# Patient Record
Sex: Male | Born: 1950 | Race: White | Hispanic: No | Marital: Married | Smoking: Never smoker
Health system: Southern US, Community
[De-identification: ages and names within clinical notes are randomized; demographics above are authoritative.]

## PROBLEM LIST (undated history)

## (undated) DIAGNOSIS — G20 Parkinson's disease: Secondary | ICD-10-CM

## (undated) DIAGNOSIS — T7840XA Allergy, unspecified, initial encounter: Secondary | ICD-10-CM

## (undated) DIAGNOSIS — R296 Repeated falls: Secondary | ICD-10-CM

## (undated) DIAGNOSIS — J45909 Unspecified asthma, uncomplicated: Secondary | ICD-10-CM

## (undated) HISTORY — DX: Repeated falls: R29.6

## (undated) HISTORY — PX: SHOULDER SURGERY: SHX246

## (undated) HISTORY — PX: TONSILLECTOMY: SUR1361

## (undated) HISTORY — DX: Allergy, unspecified, initial encounter: T78.40XA

## (undated) HISTORY — PX: CATARACT EXTRACTION, BILATERAL: SHX1313

## (undated) HISTORY — PX: COLONOSCOPY: SHX174

## (undated) HISTORY — PX: VASECTOMY: SHX75

---

## 1999-06-30 DIAGNOSIS — G20 Parkinson's disease: Secondary | ICD-10-CM

## 1999-06-30 DIAGNOSIS — G20A1 Parkinson's disease without dyskinesia, without mention of fluctuations: Secondary | ICD-10-CM

## 1999-06-30 HISTORY — DX: Parkinson's disease without dyskinesia, without mention of fluctuations: G20.A1

## 1999-06-30 HISTORY — DX: Parkinson's disease: G20

## 2002-11-06 ENCOUNTER — Ambulatory Visit (HOSPITAL_COMMUNITY): Admission: RE | Admit: 2002-11-06 | Discharge: 2002-11-06 | Payer: Self-pay | Admitting: Gastroenterology

## 2011-03-29 DIAGNOSIS — N529 Male erectile dysfunction, unspecified: Secondary | ICD-10-CM | POA: Insufficient documentation

## 2012-09-15 ENCOUNTER — Emergency Department (HOSPITAL_COMMUNITY): Payer: BC Managed Care – PPO

## 2012-09-15 ENCOUNTER — Encounter (HOSPITAL_COMMUNITY): Payer: Self-pay | Admitting: Emergency Medicine

## 2012-09-15 ENCOUNTER — Emergency Department (HOSPITAL_COMMUNITY)
Admission: EM | Admit: 2012-09-15 | Discharge: 2012-09-15 | Disposition: A | Discharge status: Home / Self Care | Payer: BC Managed Care – PPO | Attending: Emergency Medicine | Admitting: Emergency Medicine

## 2012-09-15 DIAGNOSIS — Z79899 Other long term (current) drug therapy: Secondary | ICD-10-CM | POA: Insufficient documentation

## 2012-09-15 DIAGNOSIS — G20A1 Parkinson's disease without dyskinesia, without mention of fluctuations: Secondary | ICD-10-CM | POA: Insufficient documentation

## 2012-09-15 DIAGNOSIS — G20 Parkinson's disease: Secondary | ICD-10-CM | POA: Insufficient documentation

## 2012-09-15 DIAGNOSIS — J45909 Unspecified asthma, uncomplicated: Secondary | ICD-10-CM | POA: Insufficient documentation

## 2012-09-15 DIAGNOSIS — R0789 Other chest pain: Secondary | ICD-10-CM | POA: Insufficient documentation

## 2012-09-15 DIAGNOSIS — R079 Chest pain, unspecified: Secondary | ICD-10-CM

## 2012-09-15 HISTORY — DX: Parkinson's disease: G20

## 2012-09-15 HISTORY — DX: Unspecified asthma, uncomplicated: J45.909

## 2012-09-15 LAB — BASIC METABOLIC PANEL
BUN: 15 mg/dL (ref 6–23)
CO2: 26 mEq/L (ref 19–32)
Chloride: 102 mEq/L (ref 96–112)
Creatinine, Ser: 0.71 mg/dL (ref 0.50–1.35)
Potassium: 4 mEq/L (ref 3.5–5.1)

## 2012-09-15 LAB — CBC
HCT: 42.1 % (ref 39.0–52.0)
MCV: 94.6 fL (ref 78.0–100.0)
Platelets: 217 10*3/uL (ref 150–400)
RBC: 4.45 MIL/uL (ref 4.22–5.81)
WBC: 5 10*3/uL (ref 4.0–10.5)

## 2012-09-15 LAB — POCT I-STAT TROPONIN I: Troponin i, poc: 0.01 ng/mL (ref 0.00–0.08)

## 2012-09-15 MED ORDER — ASPIRIN 325 MG PO TABS
325.0000 mg | ORAL_TABLET | ORAL | Status: AC
  Administered 2012-09-15: 325 mg via ORAL
  Filled 2012-09-15: qty 1

## 2012-09-15 NOTE — ED Notes (Signed)
Pt presenting to ed with c/o chest tightness onset x couple of days. Pt denies nausea, vomiting and shortness of breath at this time. Pt denies radiating pain at this time

## 2012-09-15 NOTE — Discharge Instructions (Signed)
Chest Pain (Nonspecific) It is often hard to give a specific diagnosis for the cause of chest pain. There is always a chance that your pain could be related to something serious, such as a heart attack or a blood clot in the lungs. You need to follow up with your caregiver for further evaluation. CAUSES   Heartburn.  Pneumonia or bronchitis.  Anxiety or stress.  Inflammation around your heart (pericarditis) or lung (pleuritis or pleurisy).  A blood clot in the lung.  A collapsed lung (pneumothorax). It can develop suddenly on its own (spontaneous pneumothorax) or from injury (trauma) to the chest.  Shingles infection (herpes zoster virus). The chest wall is composed of bones, muscles, and cartilage. Any of these can be the source of the pain.  The bones can be bruised by injury.  The muscles or cartilage can be strained by coughing or overwork.  The cartilage can be affected by inflammation and become sore (costochondritis). DIAGNOSIS  Lab tests or other studies, such as X-rays, electrocardiography, stress testing, or cardiac imaging, may be needed to find the cause of your pain.  TREATMENT   Treatment depends on what may be causing your chest pain. Treatment may include:  Acid blockers for heartburn.  Anti-inflammatory medicine.  Pain medicine for inflammatory conditions.  Antibiotics if an infection is present.  You may be advised to change lifestyle habits. This includes stopping smoking and avoiding alcohol, caffeine, and chocolate.  You may be advised to keep your head raised (elevated) when sleeping. This reduces the chance of acid going backward from your stomach into your esophagus.  Most of the time, nonspecific chest pain will improve within 2 to 3 days with rest and mild pain medicine. HOME CARE INSTRUCTIONS   If antibiotics were prescribed, take your antibiotics as directed. Finish them even if you start to feel better.  For the next few days, avoid physical  activities that bring on chest pain. Continue physical activities as directed.  Do not smoke.  Avoid drinking alcohol.  Only take over-the-counter or prescription medicine for pain, discomfort, or fever as directed by your caregiver.  Follow your caregiver's suggestions for further testing if your chest pain does not go away.  Keep any follow-up appointments you made. If you do not go to an appointment, you could develop lasting (chronic) problems with pain. If there is any problem keeping an appointment, you must call to reschedule. SEEK MEDICAL CARE IF:   You think you are having problems from the medicine you are taking. Read your medicine instructions carefully.  Your chest pain does not go away, even after treatment.  You develop a rash with blisters on your chest. SEEK IMMEDIATE MEDICAL CARE IF:   You have increased chest pain or pain that spreads to your arm, neck, jaw, back, or abdomen.  You develop shortness of breath, an increasing cough, or you are coughing up blood.  You have severe back or abdominal pain, feel nauseous, or vomit.  You develop severe weakness, fainting, or chills.  You have a fever. THIS IS AN EMERGENCY. Do not wait to see if the pain will go away. Get medical help at once. Call your local emergency services (911 in [place] Do not drive yourself to the hospital. MAKE SURE YOU:   Understand these instructions.  Will watch your condition.  Will get help right away if you are not doing well or get worse. Document Released: 07/25/2005 Document Revised: 01/07/2012 Document Reviewed: 05/20/2008 ExitCare Patient Information 2013 [employer].  

## 2012-09-15 NOTE — ED Provider Notes (Signed)
History/physical exam/procedure(s) were performed by non-physician practitioner and as supervising physician I was immediately available for consultation/collaboration. I have reviewed all notes and am in agreement with care and plan.   Sherman Donaldson S Klay Sobotka, MD 09/15/12 1613 

## 2012-09-15 NOTE — ED Provider Notes (Signed)
History     CSN: [id number]  Arrival date & time 09/15/12  0709   First MD Initiated Contact with Patient 09/15/12 [phone]      Chief Complaint  Patient presents with  . Chest Pain    (Consider location/radiation/quality/duration/timing/severity/associated sxs/prior treatment) HPI Comments: 61 year old male with a history of asthma and Parkinson's presents emergency department with a chief complaint of chest tightness.  Onset of symptoms began Friday, located just left of the sternum, described as a constant pressure with intermittent seconds of pain worse with movement, non-positional and questionably exertional.  Patient denies any nausea, diaphoresis, shortness of breath, DOE, cough, fever, night sweats, chills, claudication, hemoptysis, abdominal pain. 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  Non-smoker, denies HTN, DM, HLD, family hx of cardiac issues.   PCP Dr. Clelia Croft, [place]   Patient is a 61 y.o. male presenting with chest pain. The history is provided by the patient.  Chest Pain     Past Medical History  Diagnosis Date  . Parkinson's disease   . Asthma     Past Surgical History  Procedure Date  . Vastectomy   . Tonsillectomy   . Shoulder surgery     No family history on file.  History  Substance Use Topics  . Smoking status: Never Smoker   . Smokeless tobacco: Not on file  . Alcohol Use: Yes     Comment: weekly      Review of Systems  Cardiovascular: Positive for chest pain.    Allergies  Review of patient's allergies indicates no known allergies.  Home Medications   Current Outpatient Rx  Name  Route  Sig  Dispense  Refill  . AMANTADINE HCL 100 MG PO CAPS   Oral   Take 100 mg by mouth daily.         Marland Kitchen CARBIDOPA-LEVODOPA-ENTACAPONE 25-100-200 MG PO TABS   Oral   Take 1 tablet by mouth 4 (four) times daily. 7am, 10am,  2pm,  and 6pm         . VITAMIN D3 1000 UNITS PO CAPS   Oral   Take 1 capsule by  mouth daily.         . IBUPROFEN 200 MG PO TABS   Oral   Take 200 mg by mouth every 6 (six) hours as needed. pain         . PRAMIPEXOLE DIHYDROCHLORIDE 1.5 MG PO TABS   Oral   Take 0.75 mg by mouth 4 (four) times daily. 1/2 tablet  at 7am, 10am,  2pm and and 6pm         . RASAGILINE MESYLATE 0.5 MG PO TABS   Oral   Take 1 mg by mouth every morning.           BP 129/75  Pulse 70  Temp 97.6 F (36.4 C) (Oral)  Resp 20  SpO2 97%  Physical Exam  Nursing note and vitals reviewed. Constitutional: He appears well-developed and well-nourished. No distress.  HENT:  Head: Normocephalic and atraumatic.  Eyes: Conjunctivae normal and EOM are normal. Pupils are equal, round, and reactive to light.  Neck: Normal range of motion. Neck supple. Normal carotid pulses and no JVD present. Carotid bruit is not present. No rigidity. Normal range of motion present.  Cardiovascular: Normal rate, regular rhythm, S1 normal, S2 normal, normal heart sounds, intact distal pulses and normal pulses.  Exam reveals no gallop and no friction rub.   No murmur  heard.      No pitting edema bilaterally, RRR, no aberrant sounds on auscultations, distal pulses intact, no carotid bruit or JVD.   Pulmonary/Chest: Effort normal and breath sounds normal. No accessory muscle usage or stridor. No respiratory distress. He exhibits no tenderness and no bony tenderness.       No reproducible chest wall tenderness  Abdominal: Bowel sounds are normal.       Soft non tender. Non pulsatile aorta.   Skin: Skin is warm, dry and intact. No rash noted. He is not diaphoretic. No cyanosis. Nails show no clubbing.    ED Course  Procedures (including critical care time)   Labs Reviewed  CBC  POCT I-STAT TROPONIN I  BASIC METABOLIC PANEL   No results found.   Date: 09/15/2012  Rate: 70  Rhythm: normal sinus rhythm  QRS Axis: normal  Intervals: normal  ST/T Wave abnormalities: normal  Conduction Disutrbances:  none  Narrative Interpretation:   Old EKG Reviewed: No old ECG    No diagnosis found.  Consult: Cardiology, to see as OP in office this week   MDM  Chest pain  61yo M w hx of parkinson's & asthma, presents to ER c/o CP onset Friday evening. Pain is not reproducible, positional & ?exertional. No associated symptoms, cardiac risk factors or family history of cardiac disease. D/t age and chief complaint will initiate cardiac work up & plan to consult cardiology for disposition of OP stress vs IP wk up. Anticipate dc home. Case discussed with attending Dr. Rosalia Hammers who agrees w my plan.   Pt to follow up w Dr. Jacinto Halim. Apt scheduled while in ER.        Jaci Carrel, [place] 09/15/12 1056

## 2012-09-22 DIAGNOSIS — R079 Chest pain, unspecified: Secondary | ICD-10-CM

## 2012-09-22 HISTORY — DX: Chest pain, unspecified: R07.9

## 2012-11-10 ENCOUNTER — Ambulatory Visit: Payer: BC Managed Care – PPO | Admitting: Internal Medicine

## 2013-05-04 ENCOUNTER — Encounter: Payer: Self-pay | Admitting: Neurology

## 2013-05-04 ENCOUNTER — Ambulatory Visit (INDEPENDENT_AMBULATORY_CARE_PROVIDER_SITE_OTHER): Payer: BC Managed Care – PPO | Admitting: Neurology

## 2013-05-04 VITALS — BP 100/64 | HR 67 | Temp 97.9°F | Ht 69.5 in | Wt 170.0 lb

## 2013-05-04 DIAGNOSIS — G20 Parkinson's disease: Secondary | ICD-10-CM

## 2013-05-04 DIAGNOSIS — G20A1 Parkinson's disease without dyskinesia, without mention of fluctuations: Secondary | ICD-10-CM

## 2013-05-04 NOTE — Patient Instructions (Addendum)
I think overall you are doing fairly well but I do want to suggest a few things today:  Remember to drink plenty of fluid, eat healthy meals and do not skip any meals. Try to eat protein with a every meal and eat a healthy snack such as fruit or nuts in between meals. Try to keep a regular sleep-wake schedule and try to exercise daily, particularly in the form of walking, 20-30 minutes a day, if you can.   Engage in social activities in your community and with your family and try to keep up with current events by reading the newspaper or watching the news.   As far as your medications are concerned, I would like to suggest no changes to your meds; try to be more regular with your meds.   As far as diagnostic testing: no new test needed at this time.   I would like to see you back in 6 months, sooner if we need to. Please call us with any interim questions, concerns, problems, updates or refill requests.  Brett Canales is my clinical assistant and will answer any of your questions and relay your messages to me and also relay most of my messages to you.  Our phone number is [phone]. We also have an after hours call service for urgent matters and there is a physician on-call for urgent questions. For any emergencies you know to call 911 or go to the nearest emergency room.

## 2013-05-04 NOTE — Progress Notes (Signed)
Subjective:    Patient ID: Zachary [name] is a 62 y.o. male.  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 He is unaccompanied today. This is his first visit after Dr. Imagene [name] retirement and he was last seen by Dr. love on 11/11/2012, at which time Dr. Sandria [name] felt that he was doing very well. He did not make any medication changes. The patient did report some daytime somnolence and was advised to take a midday nap. The patient has an underlying medical history of right fourth nerve palsy, Parkinson's disease and his current medications are amantadine 100 mg once daily, rasagiline 1 mg once daily, Stalevo 100 mg strength one tablet 4 times a day namely at 6 to 6:30, 10 to 11, 3-5 PM, between 6 to 7 PM. He is on Mirapex 1.5 mg strength half a tablet 4 times a day at the same times.   I reviewed Dr. Imagene [name] prior notes and the patient's records and below is a summary of the review of:  62 year old right-handed gentleman who was diagnosed with left Parkinson's disease in September 2000 and initially treated with selegiline and Mirapex. Sinemet was added in June 2005 and rasagiline was added and 2008. He was then switched to Stalevo. 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 He has symptoms suggestive of RBD. He had a sleep study in February 2011 which showed no sleep apnea, frequent PLMS and evidence of RBD. He denies hallucinations, agitation or delusions, postural dizziness, depression, swelling in his legs and impulsive behaviors, recurrent falls or memory loss. He exercises regularly.  He has occasional drooling at night. 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 He does work out with [employer] at [employer]T at [facility].   His Past Medical History Is Significant For: Past Medical History  Diagnosis Date  .  Parkinson's disease     Congenital R fourth nerve palsy  . Asthma    His Past Surgical History Is Significant For: Past Surgical History  Procedure Laterality Date  . Vastectomy    . Tonsillectomy    . Shoulder surgery     His Family History Is Significant For: Family History  Problem Relation Age of Onset  . Stroke Maternal Grandmother    His Social History Is Significant For: History   Social History  . Marital Status: Married    Spouse Name: Zachary [name]    Number of Children: 3  . 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 Smoking status: Never Smoker   . Smokeless tobacco: Never Used  . Alcohol Use: Yes     Comment: weekly  . Drug Use: No  . Sexually Active: None   Other Topics Concern  . None   Social History Narrative   Pt lives at home with family.   Caffeine Use: 1-2 cups daily.   His Allergies Are:  Allergies  Allergen Reactions  . Tree Extract     His Current Medications Are:  Outpatient Encounter Prescriptions as of 05/04/2013  Medication Sig Dispense Refill  . amantadine (SYMMETREL) 100 MG capsule Take 100 mg by mouth daily.      . carbidopa-levodopa-entacapone (STALEVO) 25-100-200 MG per tablet Take 1 tablet by mouth 4 (four) times daily. 7am, 10am,  2pm,  and 6pm      .  Cholecalciferol (VITAMIN D3) 1000 UNITS CAPS Take 1 capsule by mouth daily.      Marland Kitchen ibuprofen (ADVIL,MOTRIN) 200 MG tablet Take 200 mg by mouth every 6 (six) hours as needed. pain      . pramipexole (MIRAPEX) 1.5 MG tablet Take 0.75 mg by mouth 4 (four) times daily. 1/2 tablet  at 7am, 10am,  2pm and and 6pm      . rasagiline (AZILECT) 1 MG TABS Take 1 mg by mouth daily.      . [DISCONTINUED] rasagiline (AZILECT) 0.5 MG TABS Take 1 mg by mouth every morning.       No facility-administered encounter medications on file as of 05/04/2013.  : Review of Systems  Neurological:       Confusion, sleepiness    Objective:  Neurologic  Exam  Physical Exam Physical Examination:   Filed Vitals:   05/04/13 1133  BP: 100/64  Pulse: 67  Temp: 97.9 F (36.6 C)   General Examination: The patient is a very pleasant 62 y.o. male in no acute distress.  HEENT: Normocephalic, atraumatic, pupils are equal, round and reactive to light and accommodation. Funduscopic exam is normal with sharp disc margins noted. Extraocular tracking shows mild saccadic breakdown without nystagmus noted. There is no limitation to excursion of his gaze. There is mild decrease in eye blink rate. Hearing is intact. Tympanic membranes are clear bilaterally. Face is symmetric with mild facial masking and normal facial sensation. There is no lip, neck or jaw tremor. Neck is moderately rigid with intact passive ROM. There are no carotid bruits on auscultation. Oropharynx exam reveals mild mouth dryness. No significant airway crowding is noted. Mallampati is class II. Tongue protrudes centrally and palate elevates symmetrically. He has a mild head tilt to the R. There is no drooling.   Chest: is clear to auscultation without wheezing, rhonchi or crackles noted.  Heart: sounds are regular and normal without murmurs, rubs or gallops noted.   Abdomen: is soft, non-tender and non-distended with normal bowel sounds appreciated on auscultation.  Extremities: There is no pitting edema in the distal lower extremities bilaterally. Pedal pulses are intact. There are no varicose veins.  Skin: is warm and dry with no trophic changes noted.    Musculoskeletal: exam reveals no obvious joint deformities, tenderness, joint swelling or erythema.  Neurologically:  Mental status: The patient is awake and alert, paying good  attention. He is able to completely provide the history. He is oriented to: person, place, time/date, situation, day of week, month of year and year. His memory, attention, language and knowledge are intact. There is no aphasia, agnosia, apraxia or anomia.  There is no significant degree of bradyphrenia. Speech is mildly hypophonic with no dysarthria noted. Mood is congruent and affect is normal.    Cranial nerves are as described above under HEENT exam. In addition, shoulder shrug is normal with equal shoulder height noted.  Motor exam: Normal bulk, and strength for age is noted. There are no dyskinesias noted.   Tone is mildly rigid with presence of cogwheeling in the left upper extremity. There is overall mild bradykinesia. There is no drift or rebound.  There is no tremor with the exception of a rare resting tremor in the left upper extremity only.  Reflexes are 1+ in the upper extremities and 1+ in the lower extremities.   Fine motor skills exam: Finger taps are mildly impaired on the right and moderately impaired on the left. Hand movements are minimally impaired on  the right and mildly impaired on the left. RAP (rapid alternating patting) is not impaired on the right and mildly impaired on the left. Foot taps are mildly impaired on the right and moderately impaired on the left. Foot agility (in the form of heel stomping) is not impaired on the right and minimally impaired on the left.    Cerebellar testing shows no dysmetria or intention tremor on finger to nose testing. Heel to shin is unremarkable bilaterally. There is no truncal or gait ataxia.   Sensory exam is intact to light touch, pinprick, vibration, temperature sense and proprioception in the upper and lower extremities.   Gait, station and balance: He stands up from the seated position with no significant difficulty and does not need to push up with His hands. He needs no assistance. No veering to one side is noted. He is noted to lean to the right very slightly. Posture is mildly stooped. Stance is narrow-based. He walks with decrease in stride length and pace and decreased arm swing on the left. He turns in en bloc. Tandem walk is not possible. Balance is Preserved.   Assessment and  Plan:   Assessment and Plan:  In summary, Zachary [name] is a very pleasant 62 y.o.-year old male with a history of Parkinson's disease, left-sided predominant, likely in its 15th year at this time. Symptoms date back to when he was 48. His exam has remained stable and has not progressed in the past 4 months. I reassured him that regard. I did not suggest any medication changes and encouraged him to continue with regular exercise. I did ask him to take his medications more regularly, on 4 hourly schedule. He was in agreement. Since he is stable I suggested a six-month followup. He did not need any prescriptions today but next time he is up for refills I would like to change him to Mirapex 0.75 mg strength 4 times a day so he does not have to break the 1.5 mg strength pills. They do not break evenly.   I answered all his questions today and the patient was in agreement with the above outlined plan. I would like to see the patient back in 6 months, sooner if the need arises and encouraged him to call with any interim questions, concerns, problems or updates and refill requests.

## 2013-05-28 ENCOUNTER — Other Ambulatory Visit: Payer: Self-pay

## 2013-05-28 MED ORDER — RASAGILINE MESYLATE 1 MG PO TABS: 1.0000 mg | ORAL_TABLET | Freq: Every day | ORAL | Status: DC

## 2013-05-28 MED ORDER — PRAMIPEXOLE DIHYDROCHLORIDE 1.5 MG PO TABS: 0.7500 mg | ORAL_TABLET | Freq: Four times a day (QID) | ORAL | Status: DC

## 2013-05-28 MED ORDER — CARBIDOPA-LEVODOPA-ENTACAPONE 25-100-200 MG PO TABS: 1.0000 | ORAL_TABLET | Freq: Four times a day (QID) | ORAL | Status: DC

## 2013-06-26 ENCOUNTER — Other Ambulatory Visit: Payer: Self-pay | Admitting: Neurology

## 2013-06-26 ENCOUNTER — Other Ambulatory Visit: Payer: Self-pay

## 2013-06-26 MED ORDER — AMANTADINE HCL 100 MG PO CAPS: 100.0000 mg | ORAL_CAPSULE | Freq: Every day | ORAL | Status: DC

## 2013-09-03 ENCOUNTER — Other Ambulatory Visit: Payer: Self-pay

## 2013-11-03 ENCOUNTER — Encounter: Payer: Self-pay | Admitting: Neurology

## 2013-11-03 ENCOUNTER — Ambulatory Visit (INDEPENDENT_AMBULATORY_CARE_PROVIDER_SITE_OTHER): Payer: BC Managed Care – PPO | Admitting: Neurology

## 2013-11-03 ENCOUNTER — Encounter (INDEPENDENT_AMBULATORY_CARE_PROVIDER_SITE_OTHER): Payer: Self-pay

## 2013-11-03 VITALS — BP 119/74 | HR 68 | Temp 97.0°F | Ht 69.5 in | Wt 176.0 lb

## 2013-11-03 DIAGNOSIS — G20 Parkinson's disease: Secondary | ICD-10-CM

## 2013-11-03 MED ORDER — RASAGILINE MESYLATE 1 MG PO TABS: 1.0000 mg | ORAL_TABLET | Freq: Every day | ORAL | Status: DC

## 2013-11-03 MED ORDER — CARBIDOPA-LEVODOPA-ENTACAPONE 25-100-200 MG PO TABS: 1.0000 | ORAL_TABLET | Freq: Four times a day (QID) | ORAL | Status: DC

## 2013-11-03 MED ORDER — PRAMIPEXOLE DIHYDROCHLORIDE 1.5 MG PO TABS: 0.7500 mg | ORAL_TABLET | Freq: Four times a day (QID) | ORAL | Status: DC

## 2013-11-03 MED ORDER — AMANTADINE HCL 100 MG PO CAPS: 100.0000 mg | ORAL_CAPSULE | Freq: Every day | ORAL | Status: DC

## 2013-11-03 NOTE — Patient Instructions (Addendum)
I think your Parkinson's disease has remained fairly stable, which is reassuring. Nevertheless, as you know, this disease does progress with time. It can affect your balance, your memory, your mood, your bowel and bladder function, your posture, balance and walking. Overall you are doing fairly well but I do want to suggest a few things today:  Remember to drink plenty of fluid, eat healthy meals and do not skip any meals. Try to eat protein with a every meal and eat a healthy snack such as fruit or nuts in between meals. Try to keep a regular sleep-wake schedule and try to exercise daily, particularly in the form of walking, 20-30 minutes a day, if you can.   Taking your medication on schedule is key.   Try to stay active physically and mentally. Engage in social activities in your community and with your family and try to keep up with current events by reading the newspaper or watching the news. Try to do word puzzles and you may like to do word puzzles and brain games on the computer such as on [email or web address].   As far as your medications are concerned, I would like to suggest that you take your current medication with the following additional changes: no changes today.    As far as diagnostic testing, I will order: no new test.    I would like to see you back in 6 months, sooner if we need to. Please call us with any interim questions, concerns, problems, updates or refill requests.  Our nursing staff will answer any of your questions and relay your messages to me and also relay most of my messages to you.  Our phone number is [phone]. We also have an after hours call service for urgent matters and there is a physician on-call for urgent questions, that cannot wait till the next work day. For any emergencies you know to call 911 or go to the nearest emergency room.

## 2013-11-03 NOTE — Progress Notes (Signed)
Subjective:    Patient ID: Zachary [name] is a 63 y.o. male.  HPI  Interim history:   Zachary [name] is a very pleasant 63 year-old right-handed gentleman who presents for followup consultation of his left-sided predominant Parkinson's disease, originally diagnosed in September 2000. He is unaccompanied today. I first met him on 05/04/2013, which time I felt he was stable. I did not suggest any new medication changes at the time and encouraged him to continue with regular exercise. I also asked him to be regular with his medication regimen, which includes amantadine 100 mg once daily, rasagiline 1 mg once daily, Stalevo 100 mg strength one tablet 4 times a day namely at 6 to 6:30, 10 to 11, 2 or 2:30 PM, between 6 to 7 PM, no later than 7:30 PM and Mirapex 1.5 mg strength half a tablet 4 times a day. He previously followed with Dr. Morene Antu and was last seen by Dr. Erling Cruz on 11/11/2012, at which time Dr. Erling Cruz felt that he was doing very well. He did not make any medication changes. The patient did report some daytime somnolence and was advised to take a midday nap. The patient has an underlying medical history of right fourth nerve palsy, and Parkinson's disease.   Today, he reports no issues with his sleep, no constipation, no falls, no memory issues, no mood issues and he stopped taking vitamin D. He has noted an increase in stiffness and little increase in tremor and mild wearing off. He has called out in his dreams recently 2 times, but never has fallen out of bed.   He was diagnosed with left sided Parkinson's disease in September 2000 and initially treated with selegiline and Mirapex. Sinemet was added in June 2005 and rasagiline was added and 2008. He was then switched to Stalevo. 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. He has symptoms suggestive of RBD. He had a sleep study in February 2011 which showed no sleep apnea, frequent PLMS and evidence of RBD. He denies  hallucinations, agitation or delusions, postural dizziness, depression, swelling in his legs and impulsive behaviors, recurrent falls or memory loss. He exercises regularly.  He has occasional drooling at night. 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 He does work out with [employer] at [employer]C.T at [employer].   His Past Medical History Is Significant For: Past Medical History  Diagnosis Date  . Parkinson's disease     Congenital R fourth nerve palsy  . Asthma     His Past Surgical History Is Significant For: Past Surgical History  Procedure Laterality Date  . Vastectomy    . Tonsillectomy    . Shoulder surgery      His Family History Is Significant For: Family History  Problem Relation Age of Onset  . Stroke Maternal Grandmother   . Dementia Mother     His Social History Is Significant For: History   Social History  . Marital Status: Married    Spouse Name: Zachary [name]    Number of Children: 3  . 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 Smoking status: Never Smoker   . Smokeless tobacco: Never Used  . Alcohol Use: Yes     Comment: weekly  . Drug Use: No  . Sexual Activity: None   Other Topics Concern  . None   Social History Narrative   Pt lives at home with  family.   Caffeine Use: 1-2 cups daily.    His Allergies Are:  Allergies  Allergen Reactions  . Tree Extract   :   His Current Medications Are:  Outpatient Encounter Prescriptions as of 11/03/2013  Medication Sig  . amantadine (SYMMETREL) 100 MG capsule Take 1 capsule (100 mg total) by mouth daily.  . carbidopa-levodopa-entacapone (STALEVO) 25-100-200 MG per tablet Take 1 tablet by mouth 4 (four) times daily. 7am, 10am,  2pm,  and 6pm  . ibuprofen (ADVIL,MOTRIN) 200 MG tablet Take 200 mg by mouth every 6 (six) hours as needed. pain  . pramipexole (MIRAPEX) 1.5 MG tablet Take 0.5 tablets (0.75 mg total) by mouth 4 (four) times daily. 1/2  tablet  at 7am, 10am,  2pm and and 6pm  . rasagiline (AZILECT) 1 MG TABS Take 1 tablet (1 mg total) by mouth daily.  . Cholecalciferol (VITAMIN D3) 1000 UNITS CAPS Take 1 capsule by mouth daily.  :  Review of Systems:  Out of a complete 14 point review of systems, all are reviewed and negative with the exception of these symptoms as listed below:   Review of Systems  Constitutional: Negative.   HENT: Negative.   Eyes: Negative.   Respiratory: Negative.   Cardiovascular: Negative.   Gastrointestinal: Negative.   Endocrine: Negative.   Genitourinary: Negative.   Musculoskeletal: Negative.   Skin: Negative.   Allergic/Immunologic: Negative.   Neurological: Negative.   Hematological: Negative.   Psychiatric/Behavioral: Negative.   All other systems reviewed and are negative.    Objective:  Neurologic Exam  Physical Exam Physical Examination:   Filed Vitals:   11/03/13 1148  BP: 119/74  Pulse: 68  Temp: 97 F (36.1 C)   General Examination: The patient is a very pleasant 63 y.o. male in no acute distress.  HEENT: Normocephalic, atraumatic, pupils are equal, round and reactive to light and accommodation. Funduscopic exam is normal with sharp disc margins noted. Extraocular tracking shows mild saccadic breakdown without nystagmus noted. There is no limitation to excursion of his gaze. There is mild decrease in eye blink rate. Hearing is intact. Face is symmetric with mild facial masking and normal facial sensation. There is no lip, neck or jaw tremor. Neck is moderately rigid with intact passive ROM. There are no carotid bruits on auscultation. Oropharynx exam reveals mild mouth dryness. No significant airway crowding is noted. Mallampati is class II. Tongue protrudes centrally and palate elevates symmetrically. He has a mild head tilt to the R. There is no drooling.   Chest: is clear to auscultation without wheezing, rhonchi or crackles noted.  Heart: sounds are regular and  normal without murmurs, rubs or gallops noted.   Abdomen: is soft, non-tender and non-distended with normal bowel sounds appreciated on auscultation.  Extremities: There is no pitting edema in the distal lower extremities bilaterally. Pedal pulses are intact. There are no varicose veins.  Skin: is warm and dry with no trophic changes noted.    Musculoskeletal: exam reveals no obvious joint deformities, tenderness, joint swelling or erythema.  Neurologically:  Mental status: The patient is awake and alert, paying good  attention. He is able to completely provide the history. He is oriented to: person, place, time/date, situation, day of week, month of year and year. His memory, attention, language and knowledge are intact. There is no aphasia, agnosia, apraxia or anomia. There is no significant degree of bradyphrenia. Speech is mildly hypophonic with no dysarthria noted. Mood is congruent and affect is normal.  Cranial nerves are as described above under HEENT exam. In addition, shoulder shrug is normal with equal shoulder height noted.  Motor exam: Normal bulk, and strength for age is noted. There are no dyskinesias noted.   Tone is mildly rigid with presence of cogwheeling in the left upper extremity. There is overall mild bradykinesia. There is no drift or rebound.  There is no tremor with the exception of an intermittent resting tremor in the left upper extremity only.  Reflexes are 1+ in the upper extremities and 1+ in the lower extremities.   Fine motor skills exam: Finger taps are mildly impaired on the right and moderately impaired on the left. Hand movements are minimally impaired on the right and mildly impaired on the left. RAP (rapid alternating patting) is not impaired on the right and mildly impaired on the left. Foot taps are mildly impaired on the right and moderately impaired on the left. Foot agility (in the form of heel stomping) is not impaired on the right and minimally  impaired on the left.    Cerebellar testing shows no dysmetria or intention tremor on finger to nose testing. Heel to shin is unremarkable bilaterally. There is no truncal or gait ataxia.   Sensory exam is intact to light touch, pinprick, vibration, temperature sense in the upper and lower extremities.   Gait, station and balance: He stands up from the seated position with no significant difficulty and does not need to push up with His hands. He needs no assistance. No veering to one side is noted. He is noted to lean to the right very slightly. Posture is mildly stooped. Stance is narrow-based. He walks with decrease in stride length and good pace and decreased arm swing on the left, with more pronounced hand tremor on the L. He turns in en bloc. Balance is Preserved.   Assessment and Plan:   In summary, Zachary [name] is a very pleasant 63 year old male with a history of Parkinson's disease, left-sided predominant, likely in its 15th year at this time. Symptoms date back to when he was 47. His exam has remained fairly stable, even though he reports a slight increase in tremor and increase in stiffness as well as mild wearing off phenomenon. We mutually agreed that it is probably not enough to warrant increase in his medication. He exercises regularly and I talked to him today about his full-time work and he has in fact considered retirement. Today we focused most of our conversation and discussion on DBS surgery and candidacy for that. I think overall he would be a great surgical candidate. He still has a lot of room for medication adjustments. I talked to him about the surgical procedure, its limitations, side effects and contraindications in detail today. He would consider this down the Road. I suggested a six-month followup.  I renewed all his prescriptions today and encouraged him to call with any interim questions, concerns, problems or updates and refill requests.  Most of my 25 minute visit today  was spent in counseling and coordination of care, reviewing test results and reviewing medication.

## 2014-01-23 ENCOUNTER — Other Ambulatory Visit: Payer: Self-pay | Admitting: Neurology

## 2014-01-25 NOTE — Telephone Encounter (Signed)
pramipexole (MIRAPEX) 1.5 MG tablet 60 tablet 6 11/03/2013     Sig - Route: Take 0.5 tablets (0.75 mg total) by mouth 4 (four) times daily. 1/2 tablet at 7am, 10am, 2pm and and 6pm - Oral    E-Prescribing Status: Receipt confirmed by pharmacy (11/03/2013 1:09 PM EST)             Associated Diagnoses    Parkinson's disease - Primary          Pharmacy    [facility] [place] - [address]

## 2014-05-04 ENCOUNTER — Encounter: Payer: Self-pay | Admitting: Neurology

## 2014-05-24 ENCOUNTER — Ambulatory Visit: Payer: BC Managed Care – PPO | Admitting: Neurology

## 2014-06-22 ENCOUNTER — Encounter: Payer: Self-pay | Admitting: Neurology

## 2014-06-22 ENCOUNTER — Ambulatory Visit (INDEPENDENT_AMBULATORY_CARE_PROVIDER_SITE_OTHER): Payer: BC Managed Care – PPO | Admitting: Neurology

## 2014-06-22 ENCOUNTER — Other Ambulatory Visit: Payer: Self-pay

## 2014-06-22 VITALS — BP 96/58 | HR 66 | Temp 97.1°F | Ht 70.0 in | Wt 172.0 lb

## 2014-06-22 DIAGNOSIS — G20 Parkinson's disease: Secondary | ICD-10-CM

## 2014-06-22 MED ORDER — AMANTADINE HCL 100 MG PO CAPS: 100.0000 mg | ORAL_CAPSULE | Freq: Every day | ORAL | Status: DC

## 2014-06-22 MED ORDER — PRAMIPEXOLE DIHYDROCHLORIDE 1.5 MG PO TABS: 0.7500 mg | ORAL_TABLET | Freq: Four times a day (QID) | ORAL | Status: DC

## 2014-06-22 MED ORDER — CARBIDOPA-LEVODOPA-ENTACAPONE 25-100-200 MG PO TABS: 1.0000 | ORAL_TABLET | Freq: Four times a day (QID) | ORAL | Status: DC

## 2014-06-22 NOTE — Patient Instructions (Signed)
Let's keep the medications the same. Drink more water. Stay active, see you in 6 months.

## 2014-06-22 NOTE — Progress Notes (Signed)
Subjective:    Patient ID: Zachary [name] is a 63 y.o. male.  HPI    Interim history:   Zachary [name] is a very pleasant 63 year-old right-handed gentleman with an underlying medical history of asthma and congenital right fourth nerve palsy, who presents for followup consultation of his left-sided predominant Parkinson's disease, originally diagnosed in September 2000. He is unaccompanied today. I last saw him on 11/03/2013, at which time he reported no issues with his sleep, no constipation, no falls, no memory issues, no mood issues and he stopped taking vitamin D. He had noted an increase in stiffness and little increase in tremor and mild wearing off. He had called out in his dreams recently 2 times, but never has fallen out of bed. I did not make any medication changes. We talked about the possibility of pursuing DBS in the future.  Today, he feels well, but has noted a gradual increase in stiffness, and freezing and slowness. 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 His father passed away a month ago at the age of 50. His mother is in independent living and has dementia. She has 24/7 supervision. He and his brother are not agreeing on her care. He goes to [facility] 6 days a week.   I first met him on 05/04/2013, which time I felt he was stable. I did not suggest any new medication changes at the time and encouraged him to continue with regular exercise. I also asked him to be regular with his medication regimen, which includes amantadine 100 mg once daily, rasagiline 1 mg once daily, Stalevo 100 mg strength one tablet 4 times a day namely at 6 to 6:30, 10 to 11, 2 or 2:30 PM, between 6 to 7 PM, no later than 7:30 PM and Mirapex 1.5 mg strength half a tablet 4 times a day. He previously followed with Dr. Morene Antu and was last seen by Dr. Erling Cruz on 11/11/2012, at which time Dr. Erling Cruz felt that he was doing very well. He did not make any medication changes. The patient did report some daytime somnolence  and was advised to take a midday nap. The patient has an underlying medical history of right fourth nerve palsy, and Parkinson's disease.  He was diagnosed with left sided Parkinson's disease in September 2000 and initially treated with selegiline and Mirapex. Sinemet was added in June 2005 and rasagiline was added and 2008. He was then switched to Stalevo. 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. He has symptoms suggestive of RBD. He had a sleep study in February 2011 which showed no sleep apnea, frequent PLMS and evidence of RBD. He denies hallucinations, agitation or delusions, postural dizziness, depression, swelling in his legs and impulsive behaviors, recurrent falls or memory loss. He exercises regularly.  He has occasional drooling at night. 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 He does work out with [employer] at [employer]C.T at [employer].   His Past Medical History Is Significant For: Past Medical History  Diagnosis Date  . Parkinson's disease     Congenital R fourth nerve palsy  . Asthma     His Past Surgical History Is Significant For: Past Surgical History  Procedure Laterality Date  . Vastectomy    . Tonsillectomy    . Shoulder surgery      His Family History Is Significant For: Family History  Problem Relation Age of Onset  . Stroke Maternal Grandmother   . Dementia  Mother     His Social History Is Significant For: History   Social History  . Marital Status: Married    Spouse Name: [name]    Number of Children: 3  . 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 Smoking status: Never Smoker   . Smokeless tobacco: Never Used  . Alcohol Use: Yes     Comment: weekly  . Drug Use: No  . Sexual Activity: None   Other Topics Concern  . None   Social History Narrative   Pt lives at home with family.   Caffeine Use: 1-2 cups daily.    His Allergies Are:  Allergies  Allergen  Reactions  . Tree Extract   :   His Current Medications Are:  Outpatient Encounter Prescriptions as of 06/22/2014  Medication Sig  . amantadine (SYMMETREL) 100 MG capsule Take 1 capsule (100 mg total) by mouth daily.  . carbidopa-levodopa-entacapone (STALEVO) 25-100-200 MG per tablet Take 1 tablet by mouth 4 (four) times daily. 7am, 10am,  2pm,  and 6pm  . pramipexole (MIRAPEX) 1.5 MG tablet TAKE 1/2 TABLET AT 7AM, 10AM, 2PM AND 6PM.  . rasagiline (AZILECT) 1 MG TABS tablet Take 1 tablet (1 mg total) by mouth daily.  . [DISCONTINUED] Cholecalciferol (VITAMIN D3) 1000 UNITS CAPS Take 1 capsule by mouth daily.  . [DISCONTINUED] ibuprofen (ADVIL,MOTRIN) 200 MG tablet Take 200 mg by mouth every 6 (six) hours as needed. pain  :  Review of Systems:  Out of a complete 14 point review of systems, all are reviewed and negative with the exception of these symptoms as listed below:   Review of Systems  All other systems reviewed and are negative.   Objective:  Neurologic Exam  Physical Exam Physical Examination:   Filed Vitals:   06/22/14 1533  BP: 96/58  Pulse: 66  Temp: 97.1 F (36.2 C)   General Examination: The patient is a very pleasant 63 y.o. male in no acute distress.  HEENT: Normocephalic, atraumatic, pupils are equal, round and reactive to light and accommodation. Funduscopic exam is normal with sharp disc margins noted. Extraocular tracking shows mild saccadic breakdown without nystagmus noted. There is no limitation to excursion of his gaze. There is mild decrease in eye blink rate. Hearing is intact. Face is symmetric with mild facial masking and normal facial sensation. There is no lip, neck or jaw tremor. Neck is moderately rigid with intact passive ROM. There are no carotid bruits on auscultation. Oropharynx exam reveals mild mouth dryness. No significant airway crowding is noted. Mallampati is class II. Tongue protrudes centrally and palate elevates symmetrically. He has a  mild head tilt to the R, unchanged. There is no drooling.   Chest: is clear to auscultation without wheezing, rhonchi or crackles noted.  Heart: sounds are regular and normal without murmurs, rubs or gallops noted.   Abdomen: is soft, non-tender and non-distended with normal bowel sounds appreciated on auscultation.  Extremities: There is no pitting edema in the distal lower extremities bilaterally. Pedal pulses are intact. There are no varicose veins.  Skin: is warm and dry with no trophic changes noted.    Musculoskeletal: exam reveals no obvious joint deformities, tenderness, joint swelling or erythema.  Neurologically:  Mental status: The patient is awake and alert, paying good  attention. He is able to completely provide the history. He is oriented to: person, place, time/date, situation, day of week, month of year  and year. His memory, attention, language and knowledge are intact. There is no aphasia, agnosia, apraxia or anomia. There is no significant degree of bradyphrenia. Speech is mildly hypophonic with no dysarthria noted. Mood is congruent and affect is normal.    Cranial nerves are as described above under HEENT exam. In addition, shoulder shrug is normal with equal shoulder height noted.  Motor exam: Normal bulk, and strength for age is noted. There are no dyskinesias noted.   Tone is mildly rigid with presence of cogwheeling in the left upper extremity. There is overall mild bradykinesia. There is no drift or rebound.  There is no tremor today.   Reflexes are 1+ in the upper extremities and 1+ in the lower extremities.   Fine motor skills exam: Finger taps are mildly impaired on the right and moderately impaired on the left. Hand movements are minimally impaired on the right and mildly impaired on the left. RAP (rapid alternating patting) is mildly impaired on the right and mildly impaired on the left. Foot taps are mild to moderately impaired on the right and moderately  impaired on the left. Foot agility (in the form of heel stomping) is minimally impaired on the right and mildly impaired on the left.    Cerebellar testing shows no dysmetria or intention tremor on finger to nose testing. Heel to shin is unremarkable bilaterally. There is no truncal or gait ataxia.   Sensory exam is intact to light touch, pinprick, vibration, temperature sense in the upper and lower extremities.   Gait, station and balance: He stands up from the seated position with no significant difficulty and does not need to push up with His hands. He needs no assistance. No veering to one side is noted. He is noted to lean to the right very slightly. Posture is mildly stooped. Stance is narrow-based. He walks with decrease in stride length and good pace and decreased arm swing on the left, with more pronounced hand tremor on the L. He turns in en bloc. Balance is Preserved.   Assessment and Plan:   In summary, CORBETT MOULDER is a very pleasant 63 year old male with a history of Parkinson's disease, left-sided predominant, likely in its 15th year at this time. Symptoms date back to when he was around 55. His exam has remained fairly stable, but I do see a mild change in his fine motor skills and he reports a slight increase in stiffness and slowness and sometimes freezing. Nevertheless, he would like to continue with his current medications and not add anything else at this time or change his medication regimen. Of note, he has changed the timing of his Stalevo and his half pill of Mirapex 4 times a day to 4:45 or 6 AM, 9 to 9:15 AM, 12:30 PM, and 5 to 6 PM. He takes Mirapex 1.5 mg strength half a pill at those 4 times. He continues to take Azilect 1 mg once daily in the morning as well as amantadine 100 mg once daily in the morning. 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 He is thinking about phasing out. He stays very active and works out every day. We have previously discussed potential DBS  surgery down the road. At this juncture, I would like for him to continue the current medication regimen. We can certainly think about adding a little bit more Mirapex next time or maybe a fifth pill of Stalevo at, depending on how he is doing at the time. I think he  is doing well enough to come back in 6 months. He knows to call for any interim problems. He needed a prescription for Mirapex and amantadine today which I reviewed.

## 2014-07-21 ENCOUNTER — Other Ambulatory Visit: Payer: Self-pay | Admitting: Neurology

## 2014-12-05 ENCOUNTER — Other Ambulatory Visit: Payer: Self-pay | Admitting: Neurology

## 2014-12-22 ENCOUNTER — Telehealth: Payer: Self-pay | Admitting: Neurology

## 2014-12-22 NOTE — Telephone Encounter (Signed)
Patient is calling. He has an appointment tomorrow and wants to discuss at that appointment about cutting back or rearranging patient's medication due to drowsiness. Patient takes Azilect 1mg  one daily, Amantadine 100mg  once daily, Carbidopa-Levodopa-Entacapone 25-10-200mg  four times daily and Pramipexole 1.5mg  4 times daily. Patient does not needs a call back but wants Dr. Frances FurbishAthar to be aware of discussing this at his appointment tomorrow. Thank you.

## 2014-12-23 ENCOUNTER — Encounter: Payer: Self-pay | Admitting: Neurology

## 2014-12-23 ENCOUNTER — Ambulatory Visit (INDEPENDENT_AMBULATORY_CARE_PROVIDER_SITE_OTHER): Payer: BC Managed Care – PPO | Admitting: Neurology

## 2014-12-23 VITALS — BP 111/59 | HR 64 | Temp 97.9°F | Ht 70.0 in | Wt 178.0 lb

## 2014-12-23 DIAGNOSIS — G4719 Other hypersomnia: Secondary | ICD-10-CM

## 2014-12-23 DIAGNOSIS — G20 Parkinson's disease: Secondary | ICD-10-CM

## 2014-12-23 MED ORDER — AMANTADINE HCL 100 MG PO CAPS: 100.0000 mg | ORAL_CAPSULE | Freq: Every day | ORAL | Status: DC

## 2014-12-23 MED ORDER — RASAGILINE MESYLATE 1 MG PO TABS: 1.0000 mg | ORAL_TABLET | Freq: Every day | ORAL | Status: DC

## 2014-12-23 NOTE — Patient Instructions (Addendum)
I think your Parkinson's disease has remained fairly stable, which is reassuring. Nevertheless, as you know, this disease does progress with time. It can affect your balance, your memory, your mood, your bowel and bladder function, your posture, balance and walking. Overall you are doing fairly well but I do want to suggest a few things today:  Remember to drink plenty of fluid, eat healthy meals and do not skip any meals. Try to eat protein with a every meal and eat a healthy snack such as fruit or nuts in between meals. Try to keep a regular sleep-wake schedule and try to exercise daily, particularly in the form of walking, 20-30 minutes a day, if you can.   Taking your medication on schedule is key.   Try to stay active physically and mentally. Engage in social activities in your community and with your family and try to keep up with current events by reading the newspaper or watching the news. Try to do word puzzles and you may like to do word puzzles and brain games on the computer such as on [email or web address].   As far as your medications are concerned, I would like to suggest that you take your current medication with the following additional changes: we will reduce your mirapex to 1/2 pill 3 times a day for a week to see if your sleepiness improves. If your sleepiness is no better, you can go back up to 4 times a day, and cut back on 1 pill of the Stalevo for about a week. Let me know via email or phone how it goes. You may notice a flare up in your motor symptoms.    I would like to see you back in 3-4 months, sooner if we need to. Please call us with any interim questions, concerns, problems, updates or refill requests.  Our phone number is [phone]. We also have an after hours call service for urgent matters and there is a physician on-call for urgent questions, that cannot wait till the next work day. For any emergencies you know to call 911 or go to the nearest emergency room.

## 2014-12-23 NOTE — Progress Notes (Signed)
Subjective:    Patient ID: Zachary [name] is a 64 y.o. male.  HPI     Interim history:   Zachary [name] is a very pleasant 64 year-old right-handed gentleman with an underlying medical history of asthma and congenital right fourth nerve palsy, who presents for followup consultation of his left-sided predominant Parkinson's disease, originally diagnosed in September 2000. He is accompanied by his wife today. I last saw him on 06/22/2014, at which time he reported overall doing well but noticing a gradual increase in stiffness, freezing and slowness. He was working full-time as an Psychologist, sport and exercise. His father recently passed away at the age of 18. His mother was in independent living and has a history of dementia. He was working out regularly every week. I felt he was doing well enough to continue with the current regimen of Azilect, Mirapex and Stalevo. He was considering retirement. We talked about DBS surgery as well.  Today, he reports that he still working full-time. He is an Psychologist, sport and exercise. He may go another year he feels. He likes to play golf. He has spring break coming up in the next few weeks. His main complaint is sleepiness. He has noted significant sleepiness especially with driving in the afternoon or night. His wife is not letting him drive longer distances at night and does most of the driving when it comes to longer distances. In the last week or so he decreased his Stalevo and his Mirapex to only 3 times a day for about 4 days and noticed an improvement in his sleepiness but his stiffness and motor symptoms and motor symptoms in general, worse so he increased it again. Generally speaking he still active. He does not drink enough water. He has some sleep talking and yelling out and sleep but no significant dream enactments and this actually is less than in the past according to his wife. He has no lower extremity swelling, no significant hallucinations but has had the occasional visual  illusion, no mood disorder, no significant memory issues, and no evidence of impulse control disorder.  I saw him on 11/03/2013, at which time he reported no issues with his sleep, no constipation, no falls, no memory issues, no mood issues and he stopped taking vitamin D. He had noted an increase in stiffness and little increase in tremor and mild wearing off. He had called out in his dreams recently 2 times, but never has fallen out of bed. I did not make any medication changes. We talked about the possibility of pursuing DBS in the future.  I first met him on 05/04/2013, which time I felt he was stable. I did not suggest any new medication changes at the time and encouraged him to continue with regular exercise. I also asked him to be regular with his medication regimen, which includes amantadine 100 mg once daily, rasagiline 1 mg once daily, Stalevo 100 mg strength one tablet 4 times a day namely at 6 to 6:30, 10 to 11, 2 or 2:30 PM, between 6 to 7 PM, no later than 7:30 PM and Mirapex 1.5 mg strength half a tablet 4 times a day. He previously followed with Dr. Morene Antu and was last seen by Dr. Erling Cruz on 11/11/2012, at which time Dr. Erling Cruz felt that he was doing very well. He did not make any medication changes. The patient did report some daytime somnolence and was advised to take a midday nap. The patient has an underlying medical history of right fourth nerve palsy,  and Parkinson's disease.   He was diagnosed with left sided Parkinson's disease in September 2000 and initially treated with selegiline and Mirapex. Sinemet was added in June 2005 and rasagiline was added and 2008. He was then switched to Stalevo. 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. He has symptoms suggestive of RBD. He had a sleep study in February 2011 which showed no sleep apnea, frequent PLMS and evidence of RBD. He denies hallucinations, agitation or delusions, postural dizziness, depression, swelling in his  legs and impulsive behaviors, recurrent falls or memory loss. He exercises regularly.   He has occasional drooling at night. He teaches full time at [employer] and plays golf regularly. He does work out with [employer] at [employer] at [facility].   His Past Medical History Is Significant For: Past Medical History  Diagnosis Date  . Parkinson's disease     Congenital R fourth nerve palsy  . Asthma     His Past Surgical History Is Significant For: Past Surgical History  Procedure Laterality Date  . Vastectomy    . Tonsillectomy    . Shoulder surgery      His Family History Is Significant For: Family History  Problem Relation Age of Onset  . Stroke Maternal Grandmother   . Dementia Mother     His Social History Is Significant For: History   Social History  . Marital Status: Married    Spouse Name: Zachary [name]  . Number of Children: 3  . 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 Smoking status: Never Smoker   . Smokeless tobacco: Never Used  . Alcohol Use: Yes     Comment: weekly  . Drug Use: No  . Sexual Activity: Not on file   Other Topics Concern  . None   Social History Narrative   Pt lives at home with family.   Caffeine Use: 1-2 cups daily.    His Allergies Are:  Allergies  Allergen Reactions  . Tree Extract   :   His Current Medications Are:  Outpatient Encounter Prescriptions as of 12/23/2014  Medication Sig  . amantadine (SYMMETREL) 100 MG capsule Take 1 capsule (100 mg total) by mouth daily.  . AZILECT 1 MG TABS tablet TAKE 1 TABLET ONCE DAILY.  . carbidopa-levodopa-entacapone (STALEVO) 25-100-200 MG per tablet Take 1 tablet by mouth 4 (four) times daily. 7am, 10am,  2pm,  and 6pm  . pramipexole (MIRAPEX) 1.5 MG tablet Take 0.5 tablets (0.75 mg total) by mouth 4 (four) times daily.  :  Review of Systems:  Out of a complete 14 point review of systems, all are reviewed and negative with the exception of these  symptoms as listed below:   Review of Systems  All other systems reviewed and are negative.   Objective:  Neurologic Exam  Physical Exam Physical Examination:   Filed Vitals:   12/23/14 1529  BP: 111/59  Pulse: 64  Temp: 97.9 F (36.6 C)   General Examination: The patient is a very pleasant 64 y.o. male in no acute distress.  HEENT: Normocephalic, atraumatic, pupils are equal, round and reactive to light and accommodation. Funduscopic exam is normal with sharp disc margins noted. Extraocular tracking shows mild saccadic breakdown without nystagmus noted. There is no limitation to excursion of his gaze. There is mild decrease in eye blink rate. Hearing is intact. Face is symmetric with mild facial masking and normal facial sensation. There  is no lip, neck or jaw tremor. Neck is moderately rigid with intact passive ROM. There are no carotid bruits on auscultation. Oropharynx exam reveals mild mouth dryness. No significant airway crowding is noted. Mallampati is class II. Tongue protrudes centrally and palate elevates symmetrically. He has a mild head tilt to the R, unchanged. There is no drooling.   Chest: is clear to auscultation without wheezing, rhonchi or crackles noted.  Heart: sounds are regular and normal without murmurs, rubs or gallops noted.   Abdomen: is soft, non-tender and non-distended with normal bowel sounds appreciated on auscultation.  Extremities: There is no pitting edema in the distal lower extremities bilaterally. Pedal pulses are intact. There are no varicose veins.  Skin: is warm and dry with no trophic changes noted.    Musculoskeletal: exam reveals no obvious joint deformities, tenderness, joint swelling or erythema.  Neurologically:  Mental status: The patient is awake and alert, paying good  attention. He is able to completely provide the history. He is oriented to: person, place, time/date, situation, day of week, month of year and year. His memory,  attention, language and knowledge are intact. There is no aphasia, agnosia, apraxia or anomia. There is no significant degree of bradyphrenia. Speech is mildly hypophonic with no dysarthria noted. Mood is congruent and affect is normal.    Cranial nerves are as described above under HEENT exam. In addition, shoulder shrug is normal with right shoulder slightly higher than left.  Motor exam: Normal bulk, and strength for age is noted. There are no dyskinesias noted.   Tone is mild to moderately rigid with presence of cogwheeling in the left upper extremity. There is overall mild bradykinesia. There is no drift or rebound.  There is an intermittent left upper extremity resting tremor.   Reflexes are 1+ in the upper extremities and 1+ in the lower extremities.   Fine motor skills exam: Finger taps are mild to moderately impaired on the right and moderately impaired on the left. Hand movements are minimally impaired on the right and mildly impaired on the left. RAP (rapid alternating patting) is mildly impaired on the right and mildly impaired on the left. Foot taps are mild to moderately impaired on the right and moderately impaired on the left. Foot agility (in the form of heel stomping) is mildly impaired on the right and mild to moderately impaired on the left.    Cerebellar testing shows no dysmetria or intention tremor on finger to nose testing. Heel to shin is unremarkable bilaterally. There is no truncal or gait ataxia.   Sensory exam is intact to light touch, pinprick, vibration, temperature sense in the upper and lower extremities.   Gait, station and balance: He stands up from the seated position with no significant difficulty and does not need to push up with His hands. He needs no assistance. No veering to one side is noted. He is noted to lean to the right very slightly. Posture is mildly stooped. Stance is narrow-based. He walks with decrease in stride length but  good pace and decreased arm  swing on the left, with more pronounced hand tremor on the L. He turns in en bloc. Balance is Preserved.   Assessment and Plan:   In summary, Zachary [name] is a very pleasant 64 year old male with a history of Parkinson's disease, left-sided predominant, likely in its 15th to 62 th year at this time. Symptoms date back to when he was around 36. His exam has remained fairly  stable, but I do see a mild change in his fine motor skills and he reports  bothersome sleepiness for which he reduced his medication for a few days. At this juncture, I would like to decrease his Mirapex to half a pill 3 times a day. He takes the 1.5 mg strength half a pill at a time. I think between the Stalevo and the Mirapex the dopamine agonist is more likely to give him significant sleepiness. He is advised of course not to drive when feeling sleepy. If this does not make a difference after about a week he can go back up to 4 times a day with a half pill of Mirapex and then decrease his Stalevo to 1 pill 3 times a day from currently 4 times a day. We will try this for a week as well. He is advised to call me or email with an update. We may decide to decrease the Mirapex longer-term and eventually increase his Stalevo dose to 150 mg strength. At this juncture, I will await his update in about a week or 2. We will keep the Azilect at 1 mg once daily as well as the amantadine at 100 mg once daily. Neither one of them is likely to cause him severe drowsiness.  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 He is thinking about phasing out in a year. He stays very active and works out every day and likes to play golf. We have previously discussed potential DBS surgery and he is reminded to drink more water.  I would like to see him back in 3 or 4 months since we are making some changes. I only renewed his prescription for amantadine and Azilect at this time because we may be making changes to the other 2 medicines.  I answered all their  questions and the patient and his wife were in agreement.

## 2015-02-21 ENCOUNTER — Other Ambulatory Visit: Payer: Self-pay | Admitting: Neurology

## 2015-04-19 ENCOUNTER — Ambulatory Visit: Payer: BC Managed Care – PPO | Admitting: Neurology

## 2015-04-26 ENCOUNTER — Encounter: Payer: Self-pay | Admitting: Neurology

## 2015-04-26 ENCOUNTER — Ambulatory Visit (INDEPENDENT_AMBULATORY_CARE_PROVIDER_SITE_OTHER): Payer: BC Managed Care – PPO | Admitting: Neurology

## 2015-04-26 VITALS — BP 122/74 | HR 72 | Resp 16 | Ht 70.0 in | Wt 177.0 lb

## 2015-04-26 DIAGNOSIS — G4752 REM sleep behavior disorder: Secondary | ICD-10-CM | POA: Diagnosis not present

## 2015-04-26 DIAGNOSIS — G20 Parkinson's disease: Secondary | ICD-10-CM

## 2015-04-26 MED ORDER — CLONAZEPAM 0.25 MG PO TBDP: 0.2500 mg | ORAL_TABLET | Freq: Every day | ORAL | Status: DC

## 2015-04-26 MED ORDER — CARBIDOPA-LEVODOPA-ENTACAPONE 25-100-200 MG PO TABS: 1.0000 | ORAL_TABLET | Freq: Every day | ORAL | Status: DC

## 2015-04-26 MED ORDER — PRAMIPEXOLE DIHYDROCHLORIDE 0.75 MG PO TABS: 0.7500 mg | ORAL_TABLET | Freq: Four times a day (QID) | ORAL | Status: DC

## 2015-04-26 NOTE — Patient Instructions (Addendum)
I think your Parkinson's disease has remained fairly stable, which is reassuring. Nevertheless, as you know, this disease does progress with time. It can affect your balance, your memory, your mood, your bowel and bladder function, your posture, balance and walking. Overall you are doing fairly well but I do want to suggest a few things today:  Remember to drink plenty of fluid, eat healthy meals and do not skip any meals. Try to eat protein with a every meal and eat a healthy snack such as fruit or nuts in between meals. Try to keep a regular sleep-wake schedule and try to exercise daily, particularly in the form of walking, 20-30 minutes a day, if you can.   Drink more water!  Taking your medication on schedule is key.    Try to stay active physically and mentally. Engage in social activities in your community and with your family and try to keep up with current events by reading the newspaper or watching the news. Try to do word puzzles and you may like to do word puzzles and brain games on the computer such as on [email or web address].   As far as your medications are concerned, I would like to suggest that you take your current medication with the following additional changes: we will increase your Stalevo 100 mg to 5 times a day, every 3 1/2 to 4 hours, last dose at bedtime. For your dream enactments, let's try low dose Klonopin (generic: clonazepam): 0.25 mg, take one pill each bedtime. Common side effects include sedation or sleepiness, balance problem, personality changes.     I would like to see you back in 4 months, sooner if we need to. Please call us with any interim questions, concerns, problems, updates or refill requests.  Our phone number is [phone]. We also have an after hours call service for urgent matters and there is a physician on-call for urgent questions, that cannot wait till the next work day. For any emergencies you know to call 911 or go to the nearest emergency room.

## 2015-04-26 NOTE — Progress Notes (Signed)
Subjective:    Patient ID: Zachary [name] is a 64 y.o. male.  HPI     Interim history:   [name] is a very pleasant 64 year-old right-handed gentleman with an underlying medical history of asthma and congenital right fourth nerve palsy, who presents for followup consultation of his left-sided predominant Parkinson's disease, originally diagnosed in September 2000. He is accompanied by his wife today. 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 He felt, he would go for another year. He was plaing golf. He complained of sleepiness. He was not driving at night or long distances. He had decreased his Stalevo and his Mirapex to only 3 times a day for about 4 days and noticed an improvement in his sleepiness but his stiffness and motor symptoms became worse so he increased it again. He was not drinking enough water. He had no lower extremity swelling, no significant hallucinations, no mood disorder, no significant memory issues, and no evidence of impulse control disorder. I suggested he continue with Azilect 1 mg once daily and amantadine 100 mg once daily but asked him to decrease his Mirapex to half a pill 3 times a day, because of his sleepiness.  Today, 04/26/15: He reports more tremors. He has noted more difficulty with balance especially at night when he has to get up to use the bathroom. He takes his Stalevo 3-1/2-4-1/2 hours apart, 100 mg strength. When he tried to reduce the Mirapex he felt worse so he went back up to half a pill 4 times a day. He continues to take Azilect once daily and amantadine 100 mg once daily. He continues to teach full-time. He would like to go on for another year and then retire. He drives well but tries to stick with shorter distances and daytime only. His wife provides additional information. She reports increase in nighttime dream enactments, at times almost nightly with flailing movements and sometimes loud  yelling reported. He is not aware of this. He still may not drink enough water. He exercises regularly. He denies any constipation. He has mild forgetfulness and his wife agrees that memory is for the most part stable. He has no mood related issues.  Previously:   I saw him on 06/22/2014, at which time he reported overall doing well but noticing a gradual increase in stiffness, freezing and slowness. He was working full-time as an Psychologist, sport and exercise. His father recently passed away at the age of 46. His mother was in independent living and has a history of dementia. He was working out regularly every week. I felt he was doing well enough to continue with the current regimen of Azilect, Mirapex and Stalevo. He was considering retirement. We talked about DBS surgery as well.   I saw him on 11/03/2013, at which time he reported no issues with his sleep, no constipation, no falls, no memory issues, no mood issues and he stopped taking vitamin D. He had noted an increase in stiffness and little increase in tremor and mild wearing off. He had called out in his dreams recently 2 times, but never has fallen out of bed. I did not make any medication changes. We talked about the possibility of pursuing DBS in the future.  I first met him on 05/04/2013, which time I felt he was stable. I did not suggest any new medication changes at the time and encouraged him to continue with regular exercise. I also asked him to  be regular with his medication regimen, which includes amantadine 100 mg once daily, rasagiline 1 mg once daily, Stalevo 100 mg strength one tablet 4 times a day namely at 6 to 6:30, 10 to 11, 2 or 2:30 PM, between 6 to 7 PM, no later than 7:30 PM and Mirapex 1.5 mg strength half a tablet 4 times a day. He previously followed with Dr. Morene Antu and was last seen by Dr. Erling Cruz on 11/11/2012, at which time Dr. Erling Cruz felt that he was doing very well. He did not make any medication changes. The patient did  report some daytime somnolence and was advised to take a midday nap. The patient has an underlying medical history of right fourth nerve palsy, and Parkinson's disease.   He was diagnosed with left sided Parkinson's disease in September 2000 and initially treated with selegiline and Mirapex. Sinemet was added in June 2005 and rasagiline was added and 2008. He was then switched to Stalevo. 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. He has symptoms suggestive of RBD. He had a sleep study in February 2011 which showed no sleep apnea, frequent PLMS and evidence of RBD. He denies hallucinations, agitation or delusions, postural dizziness, depression, swelling in his legs and impulsive behaviors, recurrent falls or memory loss. He exercises regularly.   He has occasional drooling at night. He teaches full time at [employer] and plays golf regularly. He does work out with [employer] at [employer]T at [facility].   His Past Medical History Is Significant For: Past Medical History  Diagnosis Date  . Parkinson's disease     Congenital R fourth nerve palsy  . Asthma     His Past Surgical History Is Significant For: Past Surgical History  Procedure Laterality Date  . Vastectomy    . Tonsillectomy    . Shoulder surgery      His Family History Is Significant For: Family History  Problem Relation Age of Onset  . Stroke Maternal Grandmother   . Dementia Mother     His Social History Is Significant For: History   Social History  . Marital Status: Married    Spouse Name: [name]  . Number of Children: 3  . 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 Smoking status: Never Smoker   . Smokeless tobacco: Never Used  . Alcohol Use: 0.0 oz/week    0 Standard drinks or equivalent per week     Comment: weekly  . Drug Use: No  . Sexual Activity: Not on file   Other Topics Concern  . None   Social History Narrative   Pt lives at home  with family.   Caffeine Use: 1-2 cups daily.    His Allergies Are:  Allergies  Allergen Reactions  . Tree Extract   :   His Current Medications Are:  Outpatient Encounter Prescriptions as of 04/26/2015  Medication Sig  . amantadine (SYMMETREL) 100 MG capsule Take 1 capsule (100 mg total) by mouth daily.  . pramipexole (MIRAPEX) 1.5 MG tablet Take 0.5 tablets (0.75 mg total) by mouth 4 (four) times daily.  . rasagiline (AZILECT) 1 MG TABS tablet Take 1 tablet (1 mg total) by mouth daily.  . STALEVO 100 25-100-200 MG per tablet TAKE 1 TAB 4 TIMES DAILY, AT 7AM, 10AM, 2PM, AND 6PM.   No facility-administered encounter medications on file as of 04/26/2015.  :  Review of Systems:  Out  of a complete 14 point review of systems, all are reviewed and negative with the exception of these symptoms as listed below:   Review of Systems  Endocrine:       Wife states that patient "puts off a lot of heat"  Neurological:       Shuffling more at night with low light, medications do not seem to last as long    Objective:  Neurologic Exam  Physical Exam Physical Examination:   Filed Vitals:   04/26/15 1553  BP: 122/74  Pulse: 72  Resp: 16   General Examination: The patient is a very pleasant 64 y.o. male in no acute distress.  HEENT: Normocephalic, atraumatic, pupils are equal, round and reactive to light and accommodation. Funduscopic exam is normal with sharp disc margins noted. Extraocular tracking shows mild saccadic breakdown without nystagmus noted. There is no limitation to excursion of his gaze. There is mild decrease in eye blink rate. Hearing is intact. Face is symmetric with mild facial masking and normal facial sensation. There is no lip, neck or jaw tremor. Neck is moderately rigid with intact passive ROM. There are no carotid bruits on auscultation. Oropharynx exam reveals mild mouth dryness. No significant airway crowding is noted. Mallampati is class II. Tongue protrudes  centrally and palate elevates symmetrically. He has a mild head tilt to the R, unchanged. There is no drooling.   Chest: is clear to auscultation without wheezing, rhonchi or crackles noted.  Heart: sounds are regular and normal without murmurs, rubs or gallops noted.   Abdomen: is soft, non-tender and non-distended with normal bowel sounds appreciated on auscultation.  Extremities: There is no pitting edema in the distal lower extremities bilaterally. Pedal pulses are intact. There are no varicose veins.  Skin: is warm and dry with no trophic changes noted.    Musculoskeletal: exam reveals no obvious joint deformities, tenderness, joint swelling or erythema.  Neurologically:  Mental status: The patient is awake and alert, paying good  attention. He is able to completely provide the history. He is oriented to: person, place, time/date, situation, day of week, month of year and year. His memory, attention, language and knowledge are well preserved. There is no aphasia, agnosia, apraxia or anomia. There is no significant degree of bradyphrenia. Speech is mild to moderately hypophonic with no dysarthria noted. Mood is congruent and affect is normal.    Cranial nerves are as described above under HEENT exam. In addition, shoulder shrug is normal with right shoulder slightly higher than left.  Motor exam: Normal bulk, and strength for age is noted. There are no dyskinesias noted.   Tone is mild to moderately rigid with presence of cogwheeling in the left upper extremity. There is overall mild bradykinesia. There is no drift or rebound.  There is a persistent left upper extremity resting tremor.   Reflexes are 1+ in the upper extremities and 1+ in the lower extremities.   Fine motor skills exam: Finger taps are mild to moderately impaired on the right and moderately impaired on the left. Hand movements are mildly impaired on the right and mild to moderately impaired on the left. RAP (rapid  alternating patting) is mildly impaired on the right and mildly impaired on the left. Foot taps are mild to moderately impaired on the right and moderately impaired on the left. Foot agility (in the form of heel stomping) is mildly impaired on the right and mild to moderately impaired on the left.    Cerebellar testing shows  no dysmetria or intention tremor on finger to nose testing. Heel to shin is unremarkable bilaterally. There is no truncal or gait ataxia.   Sensory exam is intact to light touch, pinprick, vibration, temperature sense in the upper and lower extremities.   Gait, station and balance: He stands up from the seated position with no significant difficulty and does not need to push up with His hands. He needs no assistance. No veering to one side is noted. He is noted to lean to the right. Posture is mild to near-moderately stooped. Stance is narrow-based. He walks with decrease in stride length but good pace and decreased arm swing on the left, with more pronounced hand tremor on the L. He turns in en bloc. Balance is slightly impaired.    Assessment and Plan:   In summary, LEEVON UPPERMAN is a very pleasant 64 year old male with a history of Parkinson's disease, left-sided predominant, likely in its 61 th year at this time. Symptoms date back to when he was around 4. His exam has progressed a little more. At this juncture, I suggested we have him take Stalevo 100 mg 5 times a day, namely at 6, 10, 2 PM, 6 PM and bedtime. He can continue with Mirapex 0.75 mg 4 times a day. I will change his prescription in that regard so he does not have to keep breaking pills. For his REM behavior disorder I would like for him to start clonazepam low dose, 0.25 mg at bedtime only. He is a little reluctant to try this and I suggested he can try this for about 2 weeks and see if he sleeps better and his wife can defend feedback in that regard. We will continue with amantadine and Azilect at once daily. I  provided him with a new prescription for Stalevo and clonazepam. I would like to see him back in 4 months, sooner if needed. I did encourage him to stay active physically and drink more water. 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 He is thinking about phasing out next year. He stays very active and works out every day and likes to play golf. We have previously discussed potential DBS surgery. I advised them to continue to monitor his mood, his memory, his sleep, and his driving. I answered all their questions and the patient and his wife were in agreement.  I spent 25 minutes in total face-to-face time with the patient, more than 50% of which was spent in counseling and coordination of care, reviewing test results, reviewing medication and discussing or reviewing the diagnosis of PD, its prognosis and treatment options.

## 2015-07-20 ENCOUNTER — Other Ambulatory Visit: Payer: Self-pay | Admitting: Neurology

## 2015-08-15 ENCOUNTER — Other Ambulatory Visit: Payer: Self-pay | Admitting: Neurology

## 2015-08-31 ENCOUNTER — Ambulatory Visit: Payer: BC Managed Care – PPO | Admitting: Neurology

## 2015-08-31 ENCOUNTER — Telehealth: Payer: Self-pay

## 2015-08-31 NOTE — Telephone Encounter (Signed)
I spoke to patient to reschedule appt today. Dr. Frances FurbishAthar is out sick. We were able to reschedule for later this month.

## 2015-09-17 ENCOUNTER — Other Ambulatory Visit: Payer: Self-pay | Admitting: Neurology

## 2015-09-19 ENCOUNTER — Encounter: Payer: Self-pay | Admitting: Neurology

## 2015-09-19 ENCOUNTER — Ambulatory Visit (INDEPENDENT_AMBULATORY_CARE_PROVIDER_SITE_OTHER): Payer: BC Managed Care – PPO | Admitting: Neurology

## 2015-09-19 VITALS — BP 100/52 | HR 70 | Resp 16 | Ht 70.0 in | Wt 173.0 lb

## 2015-09-19 DIAGNOSIS — G20 Parkinson's disease: Secondary | ICD-10-CM

## 2015-09-19 DIAGNOSIS — G4752 REM sleep behavior disorder: Secondary | ICD-10-CM

## 2015-09-19 DIAGNOSIS — G20A1 Parkinson's disease without dyskinesia, without mention of fluctuations: Secondary | ICD-10-CM

## 2015-09-19 MED ORDER — AMANTADINE HCL 100 MG PO CAPS: 100.0000 mg | ORAL_CAPSULE | Freq: Every day | ORAL | Status: DC

## 2015-09-19 MED ORDER — PRAMIPEXOLE DIHYDROCHLORIDE 1 MG PO TABS: 1.0000 mg | ORAL_TABLET | Freq: Three times a day (TID) | ORAL | Status: DC

## 2015-09-19 MED ORDER — RASAGILINE MESYLATE 1 MG PO TABS: 1.0000 mg | ORAL_TABLET | Freq: Every day | ORAL | Status: DC

## 2015-09-19 MED ORDER — CARBIDOPA-LEVODOPA ER 50-200 MG PO TBCR: 1.0000 | EXTENDED_RELEASE_TABLET | Freq: Every day | ORAL | Status: DC

## 2015-09-19 MED ORDER — CARBIDOPA-LEVODOPA-ENTACAPONE 25-100-200 MG PO TABS: 1.0000 | ORAL_TABLET | Freq: Every day | ORAL | Status: DC

## 2015-09-19 NOTE — Patient Instructions (Addendum)
Take Stalevo 100 mg 5 times a day, at 5:30, 8:30, 11:30 PM, 2:30 and 5:30. We will add a evening dose of Sinemet CR 50/200 mg at 8:30 PM Take Mirapex 1 mg 3 times a day, at 8:30, 2:30 and 8:30 PM. Take Amantadine 100 mg 1 pill in AM, at 5:30 AM.  Take Azilect 1 mg 1 pill daily in AM.

## 2015-09-19 NOTE — Progress Notes (Signed)
Subjective:    Patient ID: Zachary [name] is a 64 y.o. male.  HPI     Interim history:   Zachary [name] is a very pleasant 64 year-old right-handed gentleman with an underlying medical history of asthma and congenital right fourth nerve palsy, who presents for followup consultation of his left-sided predominant Parkinson's disease, originally diagnosed in September 2000. He is unaccompanied today. I last saw him on 04/26/15 at which time he reported more tremors. He had noted more difficulty with balance especially at night when he has to get up to use the bathroom. He was taking his Stalevo 3-1/2 to 4-1/2 hours apart, 100 mg strength. When he tried to reduce the Mirapex he felt worse so he went back up to half a pill 4 times a day. He was on Azilect once daily and amantadine 100 mg once daily. He was teaching full-time. He would like to go on for another year and then retire. He drives well but tries to stick with shorter distances and daytime only. His wife reported an increase in nighttime dream enactments, at times almost nightly with flailing movements and sometimes loud yelling reported. He was not aware of this. He had no mood related issues. I suggested we increase his Stalevo to 5 times a day, about every 4 hours apart. 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  Today, 09/19/2015: He reports doing fairly well. He has not had any falls. He has more stress, particularly with respect to his mother's health issues and care. She is 18 years old. He has recently increased his Stalevo to 5 pills a day. He does not always take it on a scheduled basis. He has had more freezing. He has had mild memory issues and since increasing the Stalevo he feels he sleeps a little better. However, he does take his last dose around 6 PM and feels fairly drained by the time he needs to go to bed which is around 9 PM. 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 He feels he is struggling  some. He has excessive daytime sleepiness. Sometimes he dozes off in his office. Mood wise he is stable.   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 He felt, he would go for another year. He was plaing golf. He complained of sleepiness. He was not driving at night or long distances. He had decreased his Stalevo and his Mirapex to only 3 times a day for about 4 days and noticed an improvement in his sleepiness but his stiffness and motor symptoms became worse so he increased it again. He was not drinking enough water. He had no lower extremity swelling, no significant hallucinations, no mood disorder, no significant memory issues, and no evidence of impulse control disorder. I suggested he continue with Azilect 1 mg once daily and amantadine 100 mg once daily but asked him to decrease his Mirapex to half a pill 3 times a day, because of his sleepiness.  I saw him on 06/22/2014, at which time he reported overall doing well but noticing a gradual increase in stiffness, freezing and slowness. He was working full-time as an Psychologist, sport and exercise. His father recently passed away at the age of 58. His mother was in independent living and has a history of dementia. He was working out regularly every week. I felt he was doing well enough to continue with the current regimen  of Azilect, Mirapex and Stalevo. He was considering retirement. We talked about DBS surgery as well.   I saw him on 11/03/2013, at which time he reported no issues with his sleep, no constipation, no falls, no memory issues, no mood issues and he stopped taking vitamin D. He had noted an increase in stiffness and little increase in tremor and mild wearing off. He had called out in his dreams recently 2 times, but never has fallen out of bed. I did not make any medication changes. We talked about the possibility of pursuing DBS in the future.  I first met him on 05/04/2013,  which time I felt he was stable. I did not suggest any new medication changes at the time and encouraged him to continue with regular exercise. I also asked him to be regular with his medication regimen, which includes amantadine 100 mg once daily, rasagiline 1 mg once daily, Stalevo 100 mg strength one tablet 4 times a day namely at 6 to 6:30, 10 to 11, 2 or 2:30 PM, between 6 to 7 PM, no later than 7:30 PM and Mirapex 1.5 mg strength half a tablet 4 times a day.   He previously followed with Dr. Morene Antu and was last seen by Dr. Erling Cruz on 11/11/2012, at which time Dr. Erling Cruz felt that he was doing very well. He did not make any medication changes. The patient did report some daytime somnolence and was advised to take a midday nap. The patient has an underlying medical history of right fourth nerve palsy, and Parkinson's disease.   He was diagnosed with left sided Parkinson's disease in September 2000 and initially treated with selegiline and Mirapex. Sinemet was added in June 2005 and rasagiline was added and 2008. He was then switched to Stalevo. 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. He has symptoms suggestive of RBD. He had a sleep study in February 2011 which showed no sleep apnea, frequent PLMS and evidence of RBD. He denies hallucinations, agitation or delusions, postural dizziness, depression, swelling in his legs and impulsive behaviors, recurrent falls or memory loss. He exercises regularly.   He has occasional drooling at night. He teaches full time at [employer] and plays golf regularly. He does work out with [employer] at [employer] at [facility].    His Past Medical History Is Significant For: Past Medical History  Diagnosis Date  . Parkinson's disease ([place])     Congenital R fourth nerve palsy  . Asthma     His Past Surgical History Is Significant For: Past Surgical History  Procedure Laterality Date  . Vastectomy    . Tonsillectomy    . Shoulder surgery      His  Family History Is Significant For: Family History  Problem Relation Age of Onset  . Stroke Maternal Grandmother   . Dementia Mother     His Social History Is Significant For: Social History   Social History  . Marital Status: Married    Spouse Name: [name]  . Number of Children: 3  . 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 Smoking status: Never Smoker   . Smokeless tobacco: Never Used  . Alcohol Use: 0.0 oz/week    0 Standard drinks or equivalent per week     Comment: weekly  . Drug Use: No  . Sexual Activity: Not Asked   Other Topics Concern  . None   Social  History Narrative   Pt lives at home with family.   Caffeine Use: 1-2 cups daily.    His Allergies Are:  Allergies  Allergen Reactions  . Tree Extract   :   His Current Medications Are:  Outpatient Encounter Prescriptions as of 09/19/2015  Medication Sig  . amantadine (SYMMETREL) 100 MG capsule Take 1 capsule (100 mg total) by mouth daily.  . carbidopa-levodopa-entacapone (STALEVO 100) 25-100-200 MG tablet Take 1 tablet by mouth 5 (five) times daily. Take at 6, 10, 2 PM, 6 PM, and bedtime.  . pramipexole (MIRAPEX) 1 MG tablet Take 1 tablet (1 mg total) by mouth 3 (three) times daily.  . rasagiline (AZILECT) 1 MG TABS tablet Take 1 tablet (1 mg total) by mouth daily.  . [DISCONTINUED] amantadine (SYMMETREL) 100 MG capsule Take 1 capsule (100 mg total) by mouth daily.  . [DISCONTINUED] AZILECT 1 MG TABS tablet TAKE 1 TABLET ONCE DAILY.  . [DISCONTINUED] carbidopa-levodopa-entacapone (STALEVO 100) 25-100-200 MG per tablet Take 1 tablet by mouth 5 (five) times daily. Take at 6, 10, 2 PM, 6 PM, and bedtime.  . [DISCONTINUED] pramipexole (MIRAPEX) 0.75 MG tablet Take 1 tablet (0.75 mg total) by mouth 4 (four) times daily.  . [DISCONTINUED] clonazePAM (KLONOPIN) 0.25 MG disintegrating tablet Take 1 tablet (0.25 mg total) by mouth at bedtime.   No  facility-administered encounter medications on file as of 09/19/2015.  :  Review of Systems:  Out of a complete 14 point review of systems, all are reviewed and negative with the exception of these symptoms as listed below:   Review of Systems  Constitutional: Positive for fatigue.  Neurological:       Patient reports that he only started about 1.5 weeks ago taking Stalevo 5 times a day. He feels that the increase has helped.     Objective:  Neurologic Exam  Physical Exam Physical Examination:   Filed Vitals:   09/19/15 0819  BP: 100/52  Pulse: 70  Resp: 16   General Examination: The patient is a very pleasant 64 y.o. male in no acute distress.  HEENT: Normocephalic, atraumatic, pupils are equal, round and reactive to light and accommodation. Extraocular tracking shows mild saccadic breakdown without nystagmus noted. There is no limitation to excursion of his gaze. There is mild decrease in eye blink rate. Hearing is intact. Face is symmetric with mild facial masking and normal facial sensation. There is no lip, neck or jaw tremor. Neck is moderately rigid with intact passive ROM. There are no carotid bruits on auscultation. Oropharynx exam reveals mild mouth dryness. No significant airway crowding is noted. Mallampati is class II. Tongue protrudes centrally and palate elevates symmetrically. He has a mild head tilt to the R, unchanged. There is no drooling.   Chest: is clear to auscultation without wheezing, rhonchi or crackles noted.  Heart: sounds are regular and normal without murmurs, rubs or gallops noted.   Abdomen: is soft, non-tender and non-distended with normal bowel sounds appreciated on auscultation.  Extremities: There is no pitting edema in the distal lower extremities bilaterally. Pedal pulses are intact. There are no varicose veins.  Skin: is warm and dry with no trophic changes noted.    Musculoskeletal: exam reveals no obvious joint deformities, tenderness,  joint swelling or erythema.  Neurologically:  Mental status: The patient is awake and alert, paying good  attention. He is able to completely provide the history. He is oriented to: person, place, time/date, situation, day of week, month of year and  year. His memory, attention, language and knowledge are well preserved. There is no aphasia, agnosia, apraxia or anomia. There is no significant degree of bradyphrenia. Speech is mild to moderately hypophonic with no dysarthria noted. Mood is congruent and affect is normal.    Cranial nerves are as described above under HEENT exam. In addition, shoulder shrug is normal with right shoulder slightly higher than left.  Motor exam: Normal bulk, and strength for age is noted. There are no dyskinesias noted.   Tone is mild to moderately rigid with presence of cogwheeling in the left upper extremity. There is overall mild bradykinesia. There is no drift or rebound.  There is no resting tremor today.   Reflexes are 1+ in the upper extremities and 1+ in the lower extremities.   Fine motor skills exam: Finger taps are mild to moderately impaired on the right and moderately impaired on the left. Hand movements are mildly impaired on the right and moderately impaired on the left. RAP (rapid alternating patting) is mildly impaired on the right and mild to moderately impaired on the left. Foot taps are moderately impaired on the right and moderately impaired on the left, worse than right. Foot agility (in the form of heel stomping) is mildly impaired on the right and mild to moderately impaired on the left.    Cerebellar testing shows no dysmetria or intention tremor on finger to nose testing. Heel to shin is unremarkable bilaterally. There is no truncal or gait ataxia.   Sensory exam is intact to light touch in the upper and lower extremities.   Gait, station and balance: He stands up from the seated position with no significant difficulty and does not need to push  up with His hands. He needs no assistance. No veering to one side is noted. He is noted to lean to the right. Posture is mild to near-moderately stooped. Stance is narrow-based. He walks with decrease in stride length but good pace and decreased arm swing on the left, with more pronounced hand tremor on the L. He turns in en bloc. Balance is slightly impaired.    Assessment and Plan:   In summary, MARKIAN GLOCKNER is a very pleasant 64 year old male with a history of Parkinson's disease, left-sided predominant, likely in its 29 th or 36 th year at this time. Symptoms date back to when he was around 34. His exam has progressed with time as expected, but overall he has done rather well. At this juncture, I suggested we do the following: Take Stalevo 100 mg 5 times a day, at 5:30, 8:30, 11:30 PM, 2:30 and 5:30. We will add a evening dose of Sinemet CR 50/200 mg at 8:30 PM, take Mirapex 1 mg 3 times a day, at 8:30, 2:30 and 8:30 PM, take Amantadine 100 mg 1 pill in AM, at 5:30 AM, take Azilect 1 mg 1 pill daily in AM. He currently sleeps a little better. For future consideration, for his REM behavior disorder we can consider clonazepam low dose, 0.25 mg at bedtime only. I renewed all his prescriptions today for 90 day supply with refills. I would like to see him back in 4 months, sooner if needed. I answered all his questions today and he was in agreement.  I did encourage him to stay active physically and drink more water. 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 He is thinking about phasing out next year. He stays very active and works out every day and likes to play  golf. We have previously discussed potential DBS surgery. I advised them to continue to monitor his mood, his memory, his sleepiness, and his driving.  I spent 25 minutes in total face-to-face time with the patient, more than 50% of which was spent in counseling and coordination of care, reviewing test results, reviewing medication and  discussing or reviewing the diagnosis of PD, its prognosis and treatment options.

## 2015-12-19 ENCOUNTER — Other Ambulatory Visit: Payer: Self-pay | Admitting: Neurology

## 2015-12-22 ENCOUNTER — Other Ambulatory Visit: Payer: Self-pay | Admitting: Neurology

## 2015-12-22 DIAGNOSIS — G20 Parkinson's disease: Secondary | ICD-10-CM

## 2015-12-23 MED ORDER — CARBIDOPA-LEVODOPA-ENTACAPONE 25-100-200 MG PO TABS: 1.0000 | ORAL_TABLET | Freq: Every day | ORAL | Status: DC

## 2015-12-23 NOTE — Addendum Note (Signed)
Addended by: Huston Foley on: 12/23/2015 11:24 AM   Modules accepted: Orders

## 2016-01-18 ENCOUNTER — Encounter: Payer: Self-pay | Admitting: Neurology

## 2016-01-18 ENCOUNTER — Ambulatory Visit (INDEPENDENT_AMBULATORY_CARE_PROVIDER_SITE_OTHER): Payer: BC Managed Care – PPO | Admitting: Neurology

## 2016-01-18 VITALS — BP 104/69 | HR 73 | Resp 16 | Ht 70.0 in | Wt 171.0 lb

## 2016-01-18 DIAGNOSIS — R413 Other amnesia: Secondary | ICD-10-CM

## 2016-01-18 DIAGNOSIS — G20 Parkinson's disease: Secondary | ICD-10-CM | POA: Diagnosis not present

## 2016-01-18 DIAGNOSIS — G4719 Other hypersomnia: Secondary | ICD-10-CM

## 2016-01-18 DIAGNOSIS — G20A1 Parkinson's disease without dyskinesia, without mention of fluctuations: Secondary | ICD-10-CM

## 2016-01-18 DIAGNOSIS — G4752 REM sleep behavior disorder: Secondary | ICD-10-CM

## 2016-01-18 MED ORDER — CARBIDOPA-LEVODOPA-ENTACAPONE 25-100-200 MG PO TABS: ORAL_TABLET | ORAL | Status: DC

## 2016-01-18 MED ORDER — RASAGILINE MESYLATE 1 MG PO TABS: 1.0000 mg | ORAL_TABLET | Freq: Every day | ORAL | Status: DC

## 2016-01-18 MED ORDER — PRAMIPEXOLE DIHYDROCHLORIDE 1 MG PO TABS: 1.0000 mg | ORAL_TABLET | Freq: Three times a day (TID) | ORAL | Status: DC

## 2016-01-18 MED ORDER — AMANTADINE HCL 100 MG PO CAPS: 100.0000 mg | ORAL_CAPSULE | Freq: Every day | ORAL | Status: DC

## 2016-01-18 NOTE — Progress Notes (Signed)
Subjective:    Patient ID: Zachary [name] is a 65 y.o. male.  HPI     Interim history:   Zachary [name] is a very pleasant 65 year-old right-handed gentleman with an underlying medical history of asthma and congenital right fourth nerve palsy, who presents for followup consultation of his left-sided predominant Parkinson's disease, originally diagnosed in September 2000. He is accompanied by his wife today. I last saw him on 09/19/2015, at which time he reported doing fairly well. He had not had any falls. He did endorse more stress especially with respect to his 72 year old mother's healthcare issues. He was using Stalevo 5 times a day. He was not always able to keep a scheduled for his medication. He had more freezing. He had mild memory issues. He thought he was sleeping a little better since increasing the Stalevo. Mood wise he was stable. 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 I suggested he take Stalevo 100 mg 5 times a day, at 5:30, 8:30, 11:30 PM, 2:30 and 5:30. I added an evening dose of Sinemet CR 50/200 mg at 8:30 PM, and he was advised to continue to take Mirapex 1 mg 3 times a day, at 8:30, 2:30 and 8:30 PM, take Amantadine 100 mg 1 pill in AM, at 5:30 AM, and take Azilect 1 mg 1 pill daily.   Today, 01/18/2016: He reports that he needs a refill on Azilect. He has also been having more symptoms in the past few months. He feels that his medication is wearing off before this time to take the next dose. He is going to retire soon. His wife has noted more forgetfulness. He has not been doing so great with his eating habits , snacking on unhealthy food, not drinking enough water, with a tendency to take his medicine without water at times. His wife has curtailed his unhealthy snacking and encouraged him to drink more water. He exercises 6 days a week with a trainer and that is going well. He does not always keep a scheduled with his medications, generally taking his Stalevo at  6 AM, 9 AM, 12, 4 PM and 8 PM, taking CR Sinemet at night has been helpful. She takes amantadine in the morning and Azilect in the morning and Mirapex at 6, 12 and 8. He had a near fainting spell recently while at church. Otherwise he has been doing well, no falls. Sadly, his mother passed away in 2015-12-06. Stress since then has been less, in particular with his brother as well. He was driving retirement but has made peace with it.  Previously:  I saw him on 04/26/15 at which time he reported more tremors. He had noted more difficulty with balance especially at night when he has to get up to use the bathroom. He was taking his Stalevo 3-1/2 to 4-1/2 hours apart, 100 mg strength. When he tried to reduce the Mirapex he felt worse so he went back up to half a pill 4 times a day. He was on Azilect once daily and amantadine 100 mg once daily. He was teaching full-time. He would like to go on for another year and then retire. He drives well but tries to stick with shorter distances and daytime only. His wife reported an increase in nighttime dream enactments, at times almost nightly with flailing movements and sometimes loud yelling reported. He was not aware of this. He had no mood related issues. I suggested we increase his Stalevo to 5 times  a day, about every 4 hours apart. 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 He felt, he would go for another year. He was plaing golf. He complained of sleepiness. He was not driving at night or long distances. He had decreased his Stalevo and his Mirapex to only 3 times a day for about 4 days and noticed an improvement in his sleepiness but his stiffness and motor symptoms became worse so he increased it again. He was not drinking enough water. He had no lower extremity swelling, no significant hallucinations, no mood disorder, no  significant memory issues, and no evidence of impulse control disorder. I suggested he continue with Azilect 1 mg once daily and amantadine 100 mg once daily but asked him to decrease his Mirapex to half a pill 3 times a day, because of his sleepiness.  I saw him on 06/22/2014, at which time he reported overall doing well but noticing a gradual increase in stiffness, freezing and slowness. He was working full-time as an Psychologist, sport and exercise. His father recently passed away at the age of 81. His mother was in independent living and has a history of dementia. He was working out regularly every week. I felt he was doing well enough to continue with the current regimen of Azilect, Mirapex and Stalevo. He was considering retirement. We talked about DBS surgery as well.   I saw him on 11/03/2013, at which time he reported no issues with his sleep, no constipation, no falls, no memory issues, no mood issues and he stopped taking vitamin D. He had noted an increase in stiffness and little increase in tremor and mild wearing off. He had called out in his dreams recently 2 times, but never has fallen out of bed. I did not make any medication changes. We talked about the possibility of pursuing DBS in the future.  I first met him on 05/04/2013, which time I felt he was stable. I did not suggest any new medication changes at the time and encouraged him to continue with regular exercise. I also asked him to be regular with his medication regimen, which includes amantadine 100 mg once daily, rasagiline 1 mg once daily, Stalevo 100 mg strength one tablet 4 times a day namely at 6 to 6:30, 10 to 11, 2 or 2:30 PM, between 6 to 7 PM, no later than 7:30 PM and Mirapex 1.5 mg strength half a tablet 4 times a day.   He previously followed with Dr. Morene Antu and was last seen by Dr. Erling Cruz on 11/11/2012, at which time Dr. Erling Cruz felt that he was doing very well. He did not make any medication changes. The patient did report some  daytime somnolence and was advised to take a midday nap. The patient has an underlying medical history of right fourth nerve palsy, and Parkinson's disease.   He was diagnosed with left sided Parkinson's disease in September 2000 and initially treated with selegiline and Mirapex. Sinemet was added in June 2005 and rasagiline was added and 2008. He was then switched to Stalevo. 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. He has symptoms suggestive of RBD. He had a sleep study in February 2011 which showed no sleep apnea, frequent PLMS and evidence of RBD. He denies hallucinations, agitation or delusions, postural dizziness, depression, swelling in his legs and impulsive behaviors, recurrent  falls or memory loss. He exercises regularly.   He has occasional drooling at night. He teaches full time at [employer] and plays golf regularly. He does work out with [employer] at [employer]T at [facility] [employer] [employer].      His Past Medical History Is Significant For: Past Medical History  Diagnosis Date  . Parkinson's disease ([place])     Congenital R fourth nerve palsy  . Asthma     His Past Surgical History Is Significant For: Past Surgical History  Procedure Laterality Date  . Vastectomy    . Tonsillectomy    . Shoulder surgery      His Family History Is Significant For: Family History  Problem Relation Age of Onset  . Stroke Maternal Grandmother   . Dementia Mother     His Social History Is Significant For: Social History   Social History  . Marital Status: Married    Spouse Name: [name]  . Number of Children: 3  . 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 Smoking status: Never Smoker   . Smokeless tobacco: Never Used  . Alcohol Use: 0.0 oz/week    0 Standard drinks or equivalent per week     Comment: weekly  . Drug Use: No  . Sexual Activity: Not Asked   Other Topics Concern  . None   Social History Narrative   Pt lives at  home with family.   Caffeine Use: 1-2 cups daily.    His Allergies Are:  Allergies  Allergen Reactions  . Tree Extract   :   His Current Medications Are:  Outpatient Encounter Prescriptions as of 01/18/2016  Medication Sig  . amantadine (SYMMETREL) 100 MG capsule Take 1 capsule (100 mg total) by mouth daily.  . carbidopa-levodopa (SINEMET CR) 50-200 MG tablet Take 1 tablet by mouth at bedtime.  . carbidopa-levodopa-entacapone (STALEVO 100) 25-100-200 MG tablet Take 1 tablet by mouth 5 (five) times daily. Take at 6, 10, 2 PM, 6 PM, and bedtime.  . pramipexole (MIRAPEX) 1 MG tablet Take 1 tablet (1 mg total) by mouth 3 (three) times daily.  . rasagiline (AZILECT) 1 MG TABS tablet Take 1 tablet (1 mg total) by mouth daily.  . [DISCONTINUED] carbidopa-levodopa-entacapone (STALEVO) 25-100-200 MG tablet TAKE 1 TABLET 5 TIMES DAILY, AT 6AM, 10AM, 2PM, 6PM AND BEDTIME.   No facility-administered encounter medications on file as of 01/18/2016.  :  Review of Systems:  Out of a complete 14 point review of systems, all are reviewed and negative with the exception of these symptoms as listed below:   Review of Systems  Neurological:       Patient states that he needs refill of Azilect.  Patient reports that he is has been struggling for the last month. He feels that his medication is wearing off before it is time to take next pill.  He is retiring soon.     Objective:  Neurologic Exam  Physical Exam Physical Examination:   Filed Vitals:   01/18/16 0922  BP: 104/69  Pulse: 73  Resp: 16   General Examination: The patient is a very pleasant 65 y.o. male in no acute distress.  HEENT: Normocephalic, atraumatic, pupils are equal, round and reactive to light and accommodation. Extraocular tracking shows mild saccadic breakdown without nystagmus noted. There is no limitation to excursion of his gaze. There is mild decrease in eye blink rate. Hearing is intact. Face is symmetric with mild  facial masking and normal facial sensation. There is no lip, neck or jaw tremor. Neck is moderately rigid with intact passive ROM. There are no carotid bruits on auscultation. Oropharynx exam reveals mild mouth dryness. No significant airway crowding is noted. Mallampati is class II. Tongue protrudes centrally and palate elevates symmetrically. He has a mild head tilt to the R, unchanged. There is no drooling.   Chest: is clear to auscultation without wheezing, rhonchi or crackles noted.  Heart: sounds are regular and normal without murmurs, rubs or gallops noted.   Abdomen: is soft, non-tender and non-distended with normal bowel sounds appreciated on auscultation.  Extremities: There is no pitting edema in the distal lower extremities bilaterally. Pedal pulses are intact. There are no varicose veins.  Skin: is warm and dry with no trophic changes noted.    Musculoskeletal: exam reveals no obvious joint deformities, tenderness, joint swelling or erythema.  Neurologically:  Mental status: The patient is awake and alert, paying good attention. He is able to completely provide the history. He is oriented to: person, place, time/date, situation, day of week, month of year and year. His memory, attention, language and knowledge are well preserved. There is no aphasia, agnosia, apraxia or anomia. There is perhaps mild bradyphrenia. Speech is mild to moderately hypophonic with no dysarthria noted. Mood is congruent and affect is normal.    Cranial nerves are as described above under HEENT exam. In addition, shoulder shrug is normal with right shoulder slightly higher than left.  Motor exam: Normal bulk, and strength for age is noted. There are no dyskinesias noted.   Tone is mild to moderately rigid with presence of cogwheeling in the left upper extremity. There is overall mild bradykinesia. There is no drift or rebound.  There is no resting tremor today.   Reflexes are 1+ in the upper extremities and  1+ in the lower extremities.   Fine motor skills exam: Finger taps are mild to moderately impaired on the right and moderately impaired on the left. Hand movements are mildly impaired on the right and moderately impaired on the left. RAP (rapid alternating patting) is mildly impaired on the right and mild to moderately impaired on the left. Foot taps are moderately impaired on the right and moderately impaired on the left, worse than right. Foot agility (in the form of heel stomping) is mildly impaired on the right and mild to moderately impaired on the left.    Cerebellar testing shows no dysmetria or intention tremor on finger to nose testing. Heel to shin is unremarkable bilaterally. There is no truncal or gait ataxia.   Sensory exam is intact to light touch in the upper and lower extremities.   Gait, station and balance: He stands up from the seated position with no significant difficulty and does not need to push up with His hands. He needs no assistance. No veering to one side is noted. He is noted to lean to the right. Posture is mild to near-moderately stooped, stable from last time. Stance is narrow-based. He walks with decrease in stride length but good pace and decreased arm swing on the left, with more pronounced hand tremor on the L. He turns in en bloc. Balance is slightly impaired and he has a tendency to turn too quickly for my liking.    Assessment and Plan:   In summary, Zachary [name] is a very pleasant 65 year old male with a history of Parkinson's disease, left-sided predominant, likely in its 15 th year  at this time. Symptoms date back to when he was around 45. His exam has progressed with time as expected, but overall he has done rather well over the last years. However, lately, He has noted more wearing off, his wife has noted an healthy snacking, more memory issues, he does not always hydrate well. Exercise wise and mental activity wise he has done rather well. He is going to  return in May by the end of this last semester. We covered a lot of ground today. They had multiple questions, I talked to him in particular also about impulse control disorder with dopamine agonist and want him to keep a very close eye on unhealthy behaviors, unhealthy lifestyle, unhealthy eating habits but it looks like he has been able to curtail fat and has actually also lost a little bit of weight. I want him to stay better hydrated and drink at least half a glass of water with every medication dose. They had some questions about turmeric. I suggested that he could take turmeric as a supplement and that it should not cause any interaction or harmful side effects, typically use for anti-inflammatory properties. They also asked about cannabis oil which I did not recommend at this time. Today is suggested that we increase Stalevo to 1 pill 6 times a day, at 6, 9, 12, 3 PM, 6 PM and 9 PM. He will continue with Sinemet CR at 9 or 10 PM which is bedtime. He will continue with amantadine once in the morning and Azilect generic once in the morning. Mirapex will be 3 times a day at 6 AM, 12 and 9 PM.  He is advised to stay active mentally and physically, next time we will likely do memory scores in the form of MMSE. We will continue to monitor his REM behavior disorder and sleep, mood, and memory. We will continue to monitor for signs and symptoms of impulse control disorder. I had a lengthy discussion today with patient and in conjunction with his wife who provided additional detailed information which was very helpful. He is coping well with the recent loss of his mother. He has in fact less stress in that regard. He is anticipating retirement in May, and is satisfied with that.

## 2016-01-18 NOTE — Patient Instructions (Addendum)
We will increase your Stalevo to 1 pill 6 times a day, at 6, 9, 12, 3 PM, 6 PM and 9 PM.    Avoid cold and cough medicine with a -"D".   Avoid unhealthy snacks.   Exercise daily, not in excess, hydrate better. Take your pills with at least 1/2 glass of water.   Good luck with your retirement. We will monitor your memory.

## 2016-03-01 ENCOUNTER — Telehealth: Payer: Self-pay | Admitting: Neurology

## 2016-03-01 ENCOUNTER — Ambulatory Visit (INDEPENDENT_AMBULATORY_CARE_PROVIDER_SITE_OTHER): Payer: BC Managed Care – PPO | Admitting: Family Medicine

## 2016-03-01 VITALS — BP 110/78 | HR 67 | Temp 97.7°F | Resp 18 | Ht 64.0 in | Wt 171.0 lb

## 2016-03-01 DIAGNOSIS — R062 Wheezing: Secondary | ICD-10-CM

## 2016-03-01 DIAGNOSIS — R0602 Shortness of breath: Secondary | ICD-10-CM

## 2016-03-01 DIAGNOSIS — H1011 Acute atopic conjunctivitis, right eye: Secondary | ICD-10-CM

## 2016-03-01 DIAGNOSIS — R0789 Other chest pain: Secondary | ICD-10-CM | POA: Diagnosis not present

## 2016-03-01 MED ORDER — AZELASTINE HCL 0.05 % OP SOLN: 1.0000 [drp] | Freq: Two times a day (BID) | OPHTHALMIC | Status: DC

## 2016-03-01 MED ORDER — ALBUTEROL SULFATE HFA 108 (90 BASE) MCG/ACT IN AERS: 1.0000 | INHALATION_SPRAY | RESPIRATORY_TRACT | Status: DC | PRN

## 2016-03-01 NOTE — Progress Notes (Signed)
Subjective:    Patient ID: Zachary [name], male    DOB: 11/08/1950, 65 y.o.   MRN: [id number]  HPI Zachary [name] is a 65 y.o. male  Started with cough and wheezing about 10 days ago.  No rhinorrhea. Dry cough, wheezing at times at night when lying in bed, and short of breath at night. Some possible dyspnea and wheeze during the day, but worse at night. No fevers. No chest pain. No known hx of CHF, CAD, or heart disease. No leg swelling. Hx of Parkinsons' disease, but no recent med change. No fever.    Hx of allergic rhinitis, prior allergy injections years ago. No recent allergy symptoms or need for otc treatments.   Has also had some crusting in his eyes past few days as well, overall symptoms have been stable, change in vision, crusted more in the morning and improves throughout the day.  Attempted treatments - none.   About 18 years ago had asthma sx's after cutting down a pear tree. No recent asthma flairs or inhaler use.    Review of Systems  Constitutional: Negative for fever, chills, activity change and appetite change.  HENT: Negative for congestion, rhinorrhea, sinus pressure and sneezing.   Eyes: Positive for discharge (right eye past 1 week. clears up throughout the day. ) and redness (min).  Respiratory: Positive for cough, shortness of breath and wheezing. Negative for chest tightness.   Cardiovascular: Negative for chest pain.       Objective:   Physical Exam  Constitutional: He is oriented to person, place, and time. He appears well-developed and well-nourished.  HENT:  Head: Normocephalic and atraumatic.  Right Ear: Tympanic membrane, external ear and ear canal normal.  Left Ear: Tympanic membrane, external ear and ear canal normal.  Nose: No rhinorrhea.  Mouth/Throat: Oropharynx is clear and moist and mucous membranes are normal. No oropharyngeal exudate or posterior oropharyngeal erythema.  Eyes: EOM are normal. Pupils are equal, round, and reactive to light.  Right eye exhibits discharge (Minimal crusting on inferior lid of right eye. Bilateral scleral injection right greater than left. No appreciable discharge at canthi). Left eye exhibits no discharge. Right conjunctiva is injected. Left conjunctiva is injected.  Neck: Neck supple.  Cardiovascular: Normal rate, regular rhythm, normal heart sounds and intact distal pulses.   No murmur heard. Pulmonary/Chest: Effort normal and breath sounds normal. No respiratory distress. He has no wheezes. He has no rhonchi. He has no rales.  Lungs clear, speaking in normal sentences without apparent distress. Normal effort. No apparent wheeze.  Abdominal: Soft. There is no tenderness.  Lymphadenopathy:    He has no cervical adenopathy.  Neurological: He is alert and oriented to person, place, and time.  Skin: Skin is warm and dry. No rash noted.  Psychiatric: He has a normal mood and affect. His behavior is normal.  Vitals reviewed.  Peak flow reading is 375, about 65% of predicted.   EKG: Sinus rhythm, no apparent changes from previous EKG.     Assessment & Plan:   DANDRA VELARDI is a 65 y.o. male Wheezing, Shortness of breath - Plan: EKG 12-Lead, albuterol (PROVENTIL HFA;VENTOLIN HFA) 108 (90 Base) MCG/ACT inhaler, Chest tightness - Plan: EKG 12-Lead  - EKG reassuring, no rales or wheeze on exam, no history of cardiac disease. Suspected allergic cause with secondary wheezing/bronchospasm versus viral illness. With overall reassuring exam, deferred x-ray or blood work.  -Can try over-the-counter Zyrtec or Claritin, just not decongestants with his  current medications for Parkinson's. This was discussed with his neurologist.  - albuterol inhaler every 4-6 hours when necessary.  this was also discussed with his neurologist and okay to use with current medications. If frequent or persistent use as listed in her after visit summary, recommended recheck here or other medical provider. RTC precautions  Allergic  conjunctivitis, right - Plan: azelastine (OPTIVAR) 0.05 % ophthalmic solution  - SPECT allergic cause. Trial of Optivar over-the-counter then RTC if not improving or worsening including visual changes as differential includes bacterial conjunctivitis. Less likely with crusting in morning only.  Meds ordered this encounter  Medications  . azelastine (OPTIVAR) 0.05 % ophthalmic solution    Sig: Place 1 drop into both eyes 2 (two) times daily.    Dispense:  6 mL    Refill:  1  . albuterol (PROVENTIL HFA;VENTOLIN HFA) 108 (90 Base) MCG/ACT inhaler    Sig: Inhale 1-2 puffs into the lungs every 4 (four) hours as needed for wheezing or shortness of breath.    Dispense:  1 Inhaler    Refill:  0   Patient Instructions       IF you received an x-ray today, you will receive an invoice from [facility]. Please contact [facility] at [phone] with questions or concerns regarding your invoice.   IF you received labwork today, you will receive an invoice from [facility]. Please contact [facility] at [phone] with questions or concerns regarding your invoice.   Our billing staff will not be able to assist you with questions regarding bills from these companies.  You will be contacted with the lab results as soon as they are available. The fastest way to get your results is to activate your My Chart account. Instructions are located on the last page of this paperwork. If you have not heard from us regarding the results in 2 weeks, please contact this office.     I suspect you have some allergies that have caused the crusting on the eyes and red eyes, as well as wheezing. Your lungs are clear on your exam today, but you may need albuterol for the wheezing at night. I will need to check with your neurologist to see if this is okay to prescribe, and will give you a call regarding this later today. If so, you can use the albuterol up to every 4-6 hours as  needed, but if you are requiring albuterol more than twice per day, or persistent use of albuterol needed over the next 2-3 days, recommend recheck to determine if other medication is needed. Return to the clinic or go to the nearest emergency room if any of your symptoms worsen or new symptoms occur.  If you're crusting in the eye worsens, or any change in her vision, return for recheck as this could indicate a bacterial infection.  Bronchospasm, Adult A bronchospasm is when the tubes that carry air in and out of your lungs (airways) spasm or tighten. During a bronchospasm it is hard to breathe. This is because the airways get smaller. A bronchospasm can be triggered by:  Allergies. These may be to animals, pollen, food, or mold.  Infection. This is a common cause of bronchospasm.  Exercise.  Irritants. These include pollution, cigarette smoke, strong odors, aerosol sprays, and paint fumes.  Weather changes.  Stress.  Being emotional. HOME CARE   Always have a plan for getting help. Know when to call your doctor and local emergency services (911 in the [place]  Know where you can get emergency care.  Only take medicines as told by your doctor.  If you were prescribed an inhaler or nebulizer machine, ask your doctor how to use it correctly. Always use a spacer with your inhaler if you were given one.  Stay calm during an attack. Try to relax and breathe more slowly.  Control your home environment:  Change your heating and air conditioning filter at least once a month.  Limit your use of fireplaces and wood stoves.  Do not  smoke. Do not  allow smoking in your home.  Avoid perfumes and fragrances.  Get rid of pests (such as roaches and mice) and their droppings.  Throw away plants if you see mold on them.  Keep your house clean and dust free.  Replace carpet with wood, tile, or vinyl flooring. Carpet can trap dander and dust.  Use allergy-proof pillows, mattress covers,  and box spring covers.  Wash bed sheets and blankets every week in hot water. Dry them in a dryer.  Use blankets that are made of polyester or cotton.  Wash hands frequently. GET HELP IF:  You have muscle aches.  You have chest pain.  The thick spit you spit or cough up (sputum) changes from clear or white to yellow, green, gray, or bloody.  The thick spit you spit or cough up gets thicker.  There are problems that may be related to the medicine you are given such as:  A rash.  Itching.  Swelling.  Trouble breathing. GET HELP RIGHT AWAY IF:  You feel you cannot breathe or catch your breath.  You cannot stop coughing.  Your treatment is not helping you breathe better.  You have very bad chest pain. MAKE SURE YOU:   Understand these instructions.  Will watch your condition.  Will get help right away if you are not doing well or get worse.   This information is not intended to replace advice given to you by your health care provider. Make sure you discuss any questions you have with your health care provider.   Document Released: 08/12/2009 Document Revised: 11/05/2014 Document Reviewed: 04/07/2013 Elsevier Interactive Patient Education 2016 [employer].   Allergic Conjunctivitis Allergic conjunctivitis is inflammation of the clear membrane that covers the white part of your eye and the inner surface of your eyelid (conjunctiva), and it is caused by allergies. The blood vessels in the conjunctiva become inflamed, and this causes the eye to become red or pink, and it often causes itchiness in the eye. Allergic conjunctivitis cannot be spread by one person to another person (noncontagious). CAUSES This condition is caused by an allergic reaction. Common causes of an allergic reaction (allergens) include:  Dust.  Pollen.  Mold.  Animal dander or secretions. RISK FACTORS This condition is more likely to develop if you are exposed to high levels of allergens  that cause the allergic reaction. This might include being outdoors when air pollen levels are high or being around animals that you are allergic to. SYMPTOMS Symptoms of this condition may include:  Eye redness.  Tearing of the eyes.  Watery eyes.  Itchy eyes.  Burning feeling in the eyes.  Clear drainage from the eyes.  Swollen eyelids. DIAGNOSIS This condition may be diagnosed by medical history and physical exam. If you have drainage from your eyes, it may be tested to rule out other causes of conjunctivitis. TREATMENT Treatment for this condition often includes medicines. These may be eye drops, ointments, or  oral medicines. They may be prescription medicines or over-the-counter medicines. HOME CARE INSTRUCTIONS  Take or apply medicines only as directed by your health care provider.  Do not touch or rub your eyes.  Do not wear contact lenses until the inflammation is gone. Wear glasses instead.  Do not wear eye makeup until the inflammation is gone.  Apply a cool, clean washcloth to your eye for 10-20 minutes, 3-4 times a day.  Try to avoid whatever allergen is causing the allergic reaction. SEEK MEDICAL CARE IF:  Your symptoms get worse.  You have pus draining from your eye.  You have new symptoms.  You have a fever.   This information is not intended to replace advice given to you by your health care provider. Make sure you discuss any questions you have with your health care provider.   Document Released: 01/05/2003 Document Revised: 11/05/2014 Document Reviewed: 07/27/2014 Elsevier Interactive Patient Education [employer]      .I personally performed the services described in this documentation, which was scribed in my presence. The recorded information has been reviewed and considered, and addended by me as needed.

## 2016-03-01 NOTE — Telephone Encounter (Signed)
Talked to Dr. Neva SeatGreene from [facility]:  Patient presented today for URI, conjunctivitis, he had some wheezing, most likely constellation of allergy symptoms and reactive airways disease. We mutually agreed that he could try allergy medication, cough syrup as long as it is without a decongestant. Anything with a "D" should be avoided, he can take plain Mucinex for example. In addition, he should be fine taking a rescue inhaler such as albuterol for wheezing. While it may raise a red flag with the Azilect, this is a theoretical or hypothetical interaction for hypertension but in this situation for as needed use is not a concern.

## 2016-03-01 NOTE — Telephone Encounter (Signed)
Dr Griffith [facility]ron[facility] called to speak with Dr Frances FurbishAthar reg pt's medications. Dr Frances FurbishAthar was skypd and said she would call him back in a few minutes. The pt is in the clinic now, please call asap.

## 2016-03-01 NOTE — Patient Instructions (Addendum)
IF you received an x-ray today, you will receive an invoice from [facility]. Please contact [facility] at [phone] with questions or concerns regarding your invoice.   IF you received labwork today, you will receive an invoice from [facility]. Please contact [facility] at [phone] with questions or concerns regarding your invoice.   Our billing staff will not be able to assist you with questions regarding bills from these companies.  You will be contacted with the lab results as soon as they are available. The fastest way to get your results is to activate your My Chart account. Instructions are located on the last page of this paperwork. If you have not heard from [place] regarding the results in 2 weeks, please contact this office.     I suspect you have some allergies that have caused the crusting on the eyes and red eyes, as well as wheezing. Your lungs are clear on your exam today, but you may need albuterol for the wheezing at night. I will need to check with your neurologist to see if this is okay to prescribe, and will give you a call regarding this later today. If so, you can use the albuterol up to every 4-6 hours as needed, but if you are requiring albuterol more than twice per day, or persistent use of albuterol needed over the next 2-3 days, recommend recheck to determine if other medication is needed. Return to the clinic or go to the nearest emergency room if any of your symptoms worsen or new symptoms occur.  If you're crusting in the eye worsens, or any change in her vision, return for recheck as this could indicate a bacterial infection.  Bronchospasm, Adult A bronchospasm is when the tubes that carry air in and out of your lungs (airways) spasm or tighten. During a bronchospasm it is hard to breathe. This is because the airways get smaller. A bronchospasm can be triggered by:  Allergies. These may be to animals, pollen, food,  or mold.  Infection. This is a common cause of bronchospasm.  Exercise.  Irritants. These include pollution, cigarette smoke, strong odors, aerosol sprays, and paint fumes.  Weather changes.  Stress.  Being emotional. HOME CARE   Always have a plan for getting help. Know when to call your doctor and local emergency services (911 in the [place] Know where you can get emergency care.  Only take medicines as told by your doctor.  If you were prescribed an inhaler or nebulizer machine, ask your doctor how to use it correctly. Always use a spacer with your inhaler if you were given one.  Stay calm during an attack. Try to relax and breathe more slowly.  Control your home environment:  Change your heating and air conditioning filter at least once a month.  Limit your use of fireplaces and wood stoves.  Do not  smoke. Do not  allow smoking in your home.  Avoid perfumes and fragrances.  Get rid of pests (such as roaches and mice) and their droppings.  Throw away plants if you see mold on them.  Keep your house clean and dust free.  Replace carpet with wood, tile, or vinyl flooring. Carpet can trap dander and dust.  Use allergy-proof pillows, mattress covers, and box spring covers.  Wash bed sheets and blankets every week in hot water. Dry them in a dryer.  Use blankets that are made of polyester or cotton.  Wash hands frequently. GET HELP IF:  You have muscle aches.  You have chest pain.  The thick spit you spit or cough up (sputum) changes from clear or white to yellow, green, gray, or bloody.  The thick spit you spit or cough up gets thicker.  There are problems that may be related to the medicine you are given such as:  A rash.  Itching.  Swelling.  Trouble breathing. GET HELP RIGHT AWAY IF:  You feel you cannot breathe or catch your breath.  You cannot stop coughing.  Your treatment is not helping you breathe better.  You have very bad chest  pain. MAKE SURE YOU:   Understand these instructions.  Will watch your condition.  Will get help right away if you are not doing well or get worse.   This information is not intended to replace advice given to you by your health care provider. Make sure you discuss any questions you have with your health care provider.   Document Released: 08/12/2009 Document Revised: 11/05/2014 Document Reviewed: 04/07/2013 Elsevier Interactive Patient Education 2016 [employer]   Allergic Conjunctivitis Allergic conjunctivitis is inflammation of the clear membrane that covers the white part of your eye and the inner surface of your eyelid (conjunctiva), and it is caused by allergies. The blood vessels in the conjunctiva become inflamed, and this causes the eye to become red or pink, and it often causes itchiness in the eye. Allergic conjunctivitis cannot be spread by one person to another person (noncontagious). CAUSES This condition is caused by an allergic reaction. Common causes of an allergic reaction (allergens) include:  Dust.  Pollen.  Mold.  Animal dander or secretions. RISK FACTORS This condition is more likely to develop if you are exposed to high levels of allergens that cause the allergic reaction. This might include being outdoors when air pollen levels are high or being around animals that you are allergic to. SYMPTOMS Symptoms of this condition may include:  Eye redness.  Tearing of the eyes.  Watery eyes.  Itchy eyes.  Burning feeling in the eyes.  Clear drainage from the eyes.  Swollen eyelids. DIAGNOSIS This condition may be diagnosed by medical history and physical exam. If you have drainage from your eyes, it may be tested to rule out other causes of conjunctivitis. TREATMENT Treatment for this condition often includes medicines. These may be eye drops, ointments, or oral medicines. They may be prescription medicines or over-the-counter medicines. HOME CARE  INSTRUCTIONS  Take or apply medicines only as directed by your health care provider.  Do not touch or rub your eyes.  Do not wear contact lenses until the inflammation is gone. Wear glasses instead.  Do not wear eye makeup until the inflammation is gone.  Apply a cool, clean washcloth to your eye for 10-20 minutes, 3-4 times a day.  Try to avoid whatever allergen is causing the allergic reaction. SEEK MEDICAL CARE IF:  Your symptoms get worse.  You have pus draining from your eye.  You have new symptoms.  You have a fever.   This information is not intended to replace advice given to you by your health care provider. Make sure you discuss any questions you have with your health care provider.   Document Released: 01/05/2003 Document Revised: 11/05/2014 Document Reviewed: 07/27/2014 Elsevier Interactive Patient Education [employer] Inc.

## 2016-05-21 ENCOUNTER — Encounter: Payer: Self-pay | Admitting: Neurology

## 2016-05-21 ENCOUNTER — Ambulatory Visit (INDEPENDENT_AMBULATORY_CARE_PROVIDER_SITE_OTHER): Payer: BC Managed Care – PPO | Admitting: Neurology

## 2016-05-21 VITALS — BP 103/65 | HR 78 | Resp 16 | Ht 70.0 in | Wt 170.0 lb

## 2016-05-21 DIAGNOSIS — R413 Other amnesia: Secondary | ICD-10-CM

## 2016-05-21 DIAGNOSIS — G20 Parkinson's disease: Secondary | ICD-10-CM

## 2016-05-21 NOTE — Progress Notes (Signed)
Subjective:    Patient ID: Zachary [name] is a 65 y.o. male.  HPI     Interim history:   Zachary [name] is a very pleasant 65 year-old right-handed gentleman with an underlying medical history of asthma and congenital right fourth nerve palsy, who presents for followup consultation of his left-sided predominant Parkinson's disease, originally diagnosed in September 2000. He is accompanied by his wife today. I last saw him on  01/18/2016, at which time he reported increase in Parkinson symptoms and wearing off symptoms for the past 2 months. He was going to return soon. He was not doing very well with his eating habits and drinking water. He was exercising 6 days a week with a trainer and that was going well. He was not always on time with his medication schedule. He was generally speaking taking his Stalevo at 6 AM, 9 AM, 12, 4 PM and 8 PM, Sinemet CR at night around bedtime. He was taking amantadine in the morning and Azilect in the morning and Mirapex at 6, 12 and 8. He had a recent near fainting spell while at church. He had no falls. He had lost his mother in February 2017.  I suggested we increase his Stalevo to 1 pill 6 times a day, starting at 6 AM, every 3 hours. He was advised to exercise regularly, not to overdo it, and hydrate better.  Today, 05/21/2016: He reports more freezing, has been very active, retired this summer.Does not always drink enough water per wife, some mild memory issues, organizational skill issues, driving only locally during daytime hours. In the interim, he was seen at urgent care by Dr. Carlota Raspberry on 03/01/2016 for an upper respiratory infection.  Previously:   I saw him on 09/19/2015, at which time he reported doing fairly well. He had not had any falls. He did endorse more stress especially with respect to his 50 year old mother's healthcare issues. He was using Stalevo 5 times a day. He was not always able to keep a scheduled for his medication. He had more freezing.  He had mild memory issues. He thought he was sleeping a little better since increasing the Stalevo. Mood wise he was stable. 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 I suggested he take Stalevo 100 mg 5 times a day, at 5:30, 8:30, 11:30 PM, 2:30 and 5:30. I added an evening dose of Sinemet CR 50/200 mg at 8:30 PM, and he was advised to continue to take Mirapex 1 mg 3 times a day, at 8:30, 2:30 and 8:30 PM, take Amantadine 100 mg 1 pill in AM, at 5:30 AM, and take Azilect 1 mg 1 pill daily.  I saw him on 04/26/15 at which time he reported more tremors. He had noted more difficulty with balance especially at night when he has to get up to use the bathroom. He was taking his Stalevo 3-1/2 to 4-1/2 hours apart, 100 mg strength. When he tried to reduce the Mirapex he felt worse so he went back up to half a pill 4 times a day. He was on Azilect once daily and amantadine 100 mg once daily. He was teaching full-time. He would like to go on for another year and then retire. He drives well but tries to stick with shorter distances and daytime only. His wife reported an increase in nighttime dream enactments, at times almost nightly with flailing movements and sometimes loud yelling reported. He was not aware of this. He had no mood related  issues. I suggested we increase his Stalevo to 5 times a day, about every 4 hours apart. 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 He felt, he would go for another year. He was plaing golf. He complained of sleepiness. He was not driving at night or long distances. He had decreased his Stalevo and his Mirapex to only 3 times a day for about 4 days and noticed an improvement in his sleepiness but his stiffness and motor symptoms became worse so he increased it again. He was not drinking enough water. He had no lower extremity  swelling, no significant hallucinations, no mood disorder, no significant memory issues, and no evidence of impulse control disorder. I suggested he continue with Azilect 1 mg once daily and amantadine 100 mg once daily but asked him to decrease his Mirapex to half a pill 3 times a day, because of his sleepiness.  I saw him on 06/22/2014, at which time he reported overall doing well but noticing a gradual increase in stiffness, freezing and slowness. He was working full-time as an Psychologist, sport and exercise. His father recently passed away at the age of 46. His mother was in independent living and has a history of dementia. He was working out regularly every week. I felt he was doing well enough to continue with the current regimen of Azilect, Mirapex and Stalevo. He was considering retirement. We talked about DBS surgery as well.   I saw him on 11/03/2013, at which time he reported no issues with his sleep, no constipation, no falls, no memory issues, no mood issues and he stopped taking vitamin D. He had noted an increase in stiffness and little increase in tremor and mild wearing off. He had called out in his dreams recently 2 times, but never has fallen out of bed. I did not make any medication changes. We talked about the possibility of pursuing DBS in the future.  I first met him on 05/04/2013, which time I felt he was stable. I did not suggest any new medication changes at the time and encouraged him to continue with regular exercise. I also asked him to be regular with his medication regimen, which includes amantadine 100 mg once daily, rasagiline 1 mg once daily, Stalevo 100 mg strength one tablet 4 times a day namely at 6 to 6:30, 10 to 11, 2 or 2:30 PM, between 6 to 7 PM, no later than 7:30 PM and Mirapex 1.5 mg strength half a tablet 4 times a day.   He previously followed with Dr. Morene Antu and was last seen by Dr. Erling Cruz on 11/11/2012, at which time Dr. Erling Cruz felt that he was doing very well. He did  not make any medication changes. The patient did report some daytime somnolence and was advised to take a midday nap. The patient has an underlying medical history of right fourth nerve palsy, and Parkinson's disease.   He was diagnosed with left sided Parkinson's disease in September 2000 and initially treated with selegiline and Mirapex. Sinemet was added in June 2005 and rasagiline was added and 2008. He was then switched to Stalevo. 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. He has symptoms suggestive of RBD. He had a sleep study in February 2011 which showed no sleep apnea, frequent PLMS and evidence of RBD. He denies hallucinations, agitation or delusions, postural  dizziness, depression, swelling in his legs and impulsive behaviors, recurrent falls or memory loss. He exercises regularly.   He has occasional drooling at night. He teaches full time at [employer] and plays golf regularly. He does work out with [employer] at [employer] at [facility].    His Past Medical History Is Significant For: Past Medical History:  Diagnosis Date  . Allergy   . Asthma   . Parkinson's disease ([place])    Congenital R fourth nerve palsy    His Past Surgical History Is Significant For: Past Surgical History:  Procedure Laterality Date  . SHOULDER SURGERY    . TONSILLECTOMY    . VASECTOMY    . vastectomy      His Family History Is Significant For: Family History  Problem Relation Age of Onset  . Stroke Maternal Grandmother   . Dementia Mother     His Social History Is Significant For: Social History   Social History  . Marital status: Married    Spouse name: [name]  . Number of children: 3  . Years of education: Ph.D   Occupational History  .  [employer]   Social History Main Topics  . Smoking status: Never Smoker  . Smokeless tobacco: Never Used  . Alcohol use 0.0 oz/week     Comment: weekly  . Drug use: No  . Sexual activity: Not Asked   Other Topics Concern  . None    Social History Narrative   Pt lives at home with family.   Caffeine Use: 1-2 cups daily.    His Allergies Are:  Allergies  Allergen Reactions  . Tree Extract   :   His Current Medications Are:  Outpatient Encounter Prescriptions as of 05/21/2016  Medication Sig  . albuterol (PROVENTIL HFA;VENTOLIN HFA) 108 (90 Base) MCG/ACT inhaler Inhale 1-2 puffs into the lungs every 4 (four) hours as needed for wheezing or shortness of breath.  Marland Kitchen amantadine (SYMMETREL) 100 MG capsule Take 1 capsule (100 mg total) by mouth daily.  Marland Kitchen azelastine (OPTIVAR) 0.05 % ophthalmic solution Place 1 drop into both eyes 2 (two) times daily.  . carbidopa-levodopa (SINEMET CR) 50-200 MG tablet Take 1 tablet by mouth at bedtime.  . carbidopa-levodopa-entacapone (STALEVO 100) 25-100-200 MG tablet Take at 6, 9, 12, 3 PM, 6 PM and 9 PM.  . pramipexole (MIRAPEX) 1 MG tablet Take 1 tablet (1 mg total) by mouth 3 (three) times daily.  . rasagiline (AZILECT) 1 MG TABS tablet Take 1 tablet (1 mg total) by mouth daily.   No facility-administered encounter medications on file as of 05/21/2016.   :  Review of Systems:  Out of a complete 14 point review of systems, all are reviewed and negative with the exception of these symptoms as listed below:  Review of Systems  Neurological:       Patient would like to discuss his medications today. He reports increased episodes of freezing.     Objective:  Neurologic Exam  Physical Exam Physical Examination:   Vitals:   05/21/16 0833  BP: 103/65  Pulse: 78  Resp: 16   General Examination: The patient is a very pleasant 65 y.o. male in no acute distress.  HEENT: Normocephalic, atraumatic, pupils are equal, round and reactive to light and accommodation. Extraocular tracking shows mild saccadic breakdown without nystagmus noted. There is no limitation to excursion of his gaze. There is mild decrease in eye blink rate. Hearing is intact. Face is symmetric with mild facial  masking and normal  facial sensation. There is no lip, neck or jaw tremor. Neck is moderately rigid with intact passive ROM. There are no carotid bruits on auscultation. Oropharynx exam reveals mild mouth dryness. No significant airway crowding is noted. Mallampati is class II. Tongue protrudes centrally and palate elevates symmetrically. He has a mild head tilt to the R, unchanged. There is no drooling.   Chest: is clear to auscultation without wheezing, rhonchi or crackles noted.  Heart: sounds are regular and normal without murmurs, rubs or gallops noted.   Abdomen: is soft, non-tender and non-distended with normal bowel sounds appreciated on auscultation.  Extremities: There is no pitting edema in the distal lower extremities bilaterally. Pedal pulses are intact. There are no varicose veins.  Skin: is warm and dry with no trophic changes noted.    Musculoskeletal: exam reveals no obvious joint deformities, tenderness, joint swelling or erythema.  Neurologically:  Mental status: The patient is awake and alert, paying good attention. He is able to completely provide the history. He is oriented to: person, place, time/date, situation, day of week, month of year and year. His memory, attention, language and knowledge are well preserved. There is no aphasia, agnosia, apraxia or anomia. There is perhaps mild bradyphrenia. Speech is mild to moderately hypophonic with no dysarthria noted. Mood is congruent and affect is normal.    Cranial nerves are as described above under HEENT exam. In addition, shoulder shrug is normal with right shoulder slightly higher than left.  Motor exam: Normal bulk, and strength for age is noted. There are no dyskinesias noted.   Tone is mild to moderately rigid with presence of cogwheeling in the left upper extremity. There is overall mild bradykinesia. There is no drift or rebound.  There is no resting tremor today.   Reflexes are 1+ in the upper extremities and 1+ in  the lower extremities.   Fine motor skills exam: Finger taps are mild to moderately impaired on the right and moderately impaired on the left. Hand movements are mildly impaired on the right and moderately impaired on the left. RAP (rapid alternating patting) is mildly impaired on the right and mild to moderately impaired on the left. Foot taps are moderately impaired on the right and moderately impaired on the left, worse than right. Foot agility (in the form of heel stomping) is moderately impaired on the right and mild to moderately impaired on the left.    Cerebellar testing shows no dysmetria or intention tremor on finger to nose testing. Heel to shin is unremarkable bilaterally. There is no truncal or gait ataxia.   Sensory exam is intact to light touch in the upper and lower extremities.   Gait, station and balance: He stands up from the seated position with no significant difficulty and does not need to push up with His hands. He needs no assistance. No veering to one side is noted. He is noted to lean to the right. Posture is mild to near-moderately stooped, slightly worse and slight R tilt noted. Stance is narrow-based. He walks with decrease in stride length but good pace and decreased arm swing on the left, with more pronounced hand tremor on the L. He turns in en bloc. Balance is slightly impaired and he has a tendency to turn too quickly for my liking.    Assessment and Plan:   In summary, NURI LARMER is a very pleasant 65 year old male with a history of Parkinson's disease, left-sided predominant, likely in its 19 th year at  this time, Was diagnosed in September 2000. Symptoms date back to when he was around 44. His exam has progressed with time as expected, but overall he has done rather well over the last years, still on the lower dose of levodopa altogether. He retired the summer. He has had more freezing. His wife is concerned that he does not always hydrate well enough. He does not  always pace himself well enough as far as his exercise is concerned. He will play 18 holes of golf in the heat but usually wear a hat and hydrate well, using the cart, no walking typically. We mutually agreed to keep his medications the same, Stalevo 1 pill 6 times a day, every 3 hours starting at 6 or 7 AM. He will continue with CR Sinemet at night, amantadine once daily, Azilect once daily and Mirapex 1 mg 3 times a day. He did not need any refills on his medications. He is advised to email me or call for any interim updates or concerns. I would like to see him back in 4 months, sooner as needed. I answered all their questions today and patient and his wife were in agreement.  He is advised to stay active mentally and physically, next time we will likely do memory scores in the form of MMSE. We will continue to monitor his REM behavior disorder and sleep, mood, and memory.  I spent 25 minutes in total face-to-face time with the patient, more than 50% of which was spent in counseling and coordination of care, reviewing test results, reviewing medication and discussing or reviewing the diagnosis of PD, its prognosis and treatment options.

## 2016-05-21 NOTE — Patient Instructions (Signed)
We will continue for now with your medication regimen. We will monitor your memory and mood. Monitor your driving skills.  We will do a 4 month check up. We will consider an increase in your Stalevo. We will do Stalevo at 1 pill 6 times a day, every 3 hours.

## 2016-08-10 ENCOUNTER — Telehealth: Payer: Self-pay | Admitting: *Deleted

## 2016-08-10 MED ORDER — CARBIDOPA-LEVODOPA 25-100 MG PO TABS: 1.0000 | ORAL_TABLET | Freq: Every day | ORAL | 2 refills | Status: DC

## 2016-08-10 NOTE — Telephone Encounter (Signed)
Received request for pt from [facility] that sinemet cr 50-200 mg tabs are on backorder.  They have 25-100 strength.  Pt takes 50-200 at bedtime, with the stalevo 25-100-200 at 6a,9a,12,3p,6p,9p.  Can pt take 2 tabs of the 25-100 sinemet, if so needs new prescription.

## 2016-08-10 NOTE — Telephone Encounter (Signed)
He can take one immediate release sinemet at night for now, will put in Rx

## 2016-08-13 NOTE — Telephone Encounter (Signed)
I spoke to pt and relayed the instruction about the replacement for his sinemet CR 50-200 which is backordered.  He will take the sinemet IR 25-100 tablet at bedtime instead.  He will continue with his stalevo as previously taking.  He verbalized understanding.   He wanted information about when his stalevo had changed and I told him that when he comes in for RV to speak with Dr. Frances FurbishAthar re: questions about this.   He stated he would.

## 2016-09-05 NOTE — Telephone Encounter (Signed)
Patient is calling and has a question about medication Carbidopa-Levodopa and the timing of taking the medication.

## 2016-09-05 NOTE — Telephone Encounter (Signed)
LM for patient to call back if he has any questions.

## 2016-09-25 ENCOUNTER — Encounter: Payer: Self-pay | Admitting: Neurology

## 2016-09-25 ENCOUNTER — Ambulatory Visit (INDEPENDENT_AMBULATORY_CARE_PROVIDER_SITE_OTHER): Payer: Medicare Other | Admitting: Neurology

## 2016-09-25 VITALS — BP 104/58 | HR 70 | Resp 14 | Ht 70.0 in | Wt 170.0 lb

## 2016-09-25 DIAGNOSIS — G4752 REM sleep behavior disorder: Secondary | ICD-10-CM

## 2016-09-25 DIAGNOSIS — F639 Impulse disorder, unspecified: Secondary | ICD-10-CM | POA: Diagnosis not present

## 2016-09-25 DIAGNOSIS — G20 Parkinson's disease: Secondary | ICD-10-CM | POA: Diagnosis not present

## 2016-09-25 DIAGNOSIS — G20A1 Parkinson's disease without dyskinesia, without mention of fluctuations: Secondary | ICD-10-CM

## 2016-09-25 DIAGNOSIS — R413 Other amnesia: Secondary | ICD-10-CM | POA: Diagnosis not present

## 2016-09-25 MED ORDER — CARBIDOPA-LEVODOPA ER 50-200 MG PO TBCR: 1.0000 | EXTENDED_RELEASE_TABLET | Freq: Every day | ORAL | 3 refills | Status: DC

## 2016-09-25 MED ORDER — AMANTADINE HCL 100 MG PO CAPS: 100.0000 mg | ORAL_CAPSULE | Freq: Every day | ORAL | 3 refills | Status: DC

## 2016-09-25 MED ORDER — PRAMIPEXOLE DIHYDROCHLORIDE 1 MG PO TABS: 0.5000 mg | ORAL_TABLET | Freq: Three times a day (TID) | ORAL | 3 refills | Status: DC

## 2016-09-25 MED ORDER — CARBIDOPA-LEVODOPA-ENTACAPONE 25-100-200 MG PO TABS: ORAL_TABLET | ORAL | 3 refills | Status: DC

## 2016-09-25 MED ORDER — RASAGILINE MESYLATE 1 MG PO TABS: 1.0000 mg | ORAL_TABLET | Freq: Every day | ORAL | 3 refills | Status: DC

## 2016-09-25 NOTE — Patient Instructions (Signed)
Impulse control disorders have been linked to taking dopamine agonist medications, which can manifest as excessive eating, obsessions with food or gambling, or hypersexuality. As discussed, I would like for you to reduce your Mirapex (generic name: pramipexole) 1mg : Take 1/2 pill in AM and 1 pill mid day and 1 pill in the evening for 1 week, then 1/2 pill in AM, 1 pill midday and 1/2 in PM for 1 week, then 1/2 pill three times a day thereafter.  We will see you back in 2 months.  Keep your appointment with Dr. Zola ButtonHaq in March.  We will keep all other meds the same: the Azilect, amantadine and stalevo and sinemet.

## 2016-09-25 NOTE — Progress Notes (Signed)
Subjective:    Patient ID: Zachary [name] is a 64 y.o. male.  HPI     Interim history:   Zachary [name] is a very pleasant 65 year-old right-handed gentleman with an underlying medical history of asthma and congenital right fourth nerve palsy, who presents for followup consultation of his left-sided predominant Parkinson's disease, originally diagnosed in September 2000. He is accompanied by his wife today. I last saw him on 05/21/2016, at which time he reported more freezing, but was very active, retired in the summer. Did not always drink enough water per wife, some mild memory issues, organizational skill issues, driving only locally during daytime hours. In the interim, he was seen at urgent care by Dr. Carlota Raspberry on 03/01/2016 for an upper respiratory infection. We mutually agreed to keep his meds the same.   Today, 09/25/2016: he reports exercising 6 days a week (boxing class 2/week, PD exercises at [facility] 1/week, gym x 45 min daily on other days and golfing), mood, memory, sleep stable. However, has had recent issues with obsessive preoccupation with woman's clothes, like a fetish. He reduced Stalevo by one pill in the past 6 weeks. Made appt with Dr. Tonye Royalty at [facility] in March. Tripped at the grocery store 2 weeks ago, thankfully no injuries.  Previously:  I saw him on 01/18/2016, at which time he reported increase in Parkinson symptoms and wearing off symptoms for the past 2 months. He was going to return soon. He was not doing very well with his eating habits and drinking water. He was exercising 6 days a week with a trainer and that was going well. He was not always on time with his medication schedule. He was generally speaking taking his Stalevo at 6 AM, 9 AM, 12, 4 PM and 8 PM, Sinemet CR at night around bedtime. He was taking amantadine in the morning and Azilect in the morning and Mirapex at 6, 12 and 8. He had a recent near fainting spell while at church. He had no falls. He had lost his mother in  February 2017.  I suggested we increase his Stalevo to 1 pill 6 times a day, starting at 6 AM, every 3 hours. He was advised to exercise regularly, not to overdo it, and hydrate better.  I saw him on 09/19/2015, at which time he reported doing fairly well. He had not had any falls. He did endorse more stress especially with respect to his 94 year old mother's healthcare issues. He was using Stalevo 5 times a day. He was not always able to keep a scheduled for his medication. He had more freezing. He had mild memory issues. He thought he was sleeping a little better since increasing the Stalevo. Mood wise he was stable. 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 I suggested he take Stalevo 100 mg 5 times a day, at 5:30, 8:30, 11:30 PM, 2:30 and 5:30. I added an evening dose of Sinemet CR 50/200 mg at 8:30 PM, and he was advised to continue to take Mirapex 1 mg 3 times a day, at 8:30, 2:30 and 8:30 PM, take Amantadine 100 mg 1 pill in AM, at 5:30 AM, and take Azilect 1 mg 1 pill daily.  I saw him on 04/26/15 at which time he reported more tremors. He had noted more difficulty with balance especially at night when he has to get up to use the bathroom. He was taking his Stalevo 3-1/2 to 4-1/2 hours apart, 100 mg strength. When he tried  to reduce the Mirapex he felt worse so he went back up to half a pill 4 times a day. He was on Azilect once daily and amantadine 100 mg once daily. He was teaching full-time. He would like to go on for another year and then retire. He drives well but tries to stick with shorter distances and daytime only. His wife reported an increase in nighttime dream enactments, at times almost nightly with flailing movements and sometimes loud yelling reported. He was not aware of this. He had no mood related issues. I suggested we increase his Stalevo to 5 times a day, about every 4 hours apart. 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 He felt, he would go for another year. He was plaing golf. He complained of sleepiness. He was not driving at night or long distances. He had decreased his Stalevo and his Mirapex to only 3 times a day for about 4 days and noticed an improvement in his sleepiness but his stiffness and motor symptoms became worse so he increased it again. He was not drinking enough water. He had no lower extremity swelling, no significant hallucinations, no mood disorder, no significant memory issues, and no evidence of impulse control disorder. I suggested he continue with Azilect 1 mg once daily and amantadine 100 mg once daily but asked him to decrease his Mirapex to half a pill 3 times a day, because of his sleepiness.  I saw him on 06/22/2014, at which time he reported overall doing well but noticing a gradual increase in stiffness, freezing and slowness. He was working full-time as an Psychologist, sport and exercise. His father recently passed away at the age of 12. His mother was in independent living and has a history of dementia. He was working out regularly every week. I felt he was doing well enough to continue with the current regimen of Azilect, Mirapex and Stalevo. He was considering retirement. We talked about DBS surgery as well.   I saw him on 11/03/2013, at which time he reported no issues with his sleep, no constipation, no falls, no memory issues, no mood issues and he stopped taking vitamin D. He had noted an increase in stiffness and little increase in tremor and mild wearing off. He had called out in his dreams recently 2 times, but never has fallen out of bed. I did not make any medication changes. We talked about the possibility of pursuing DBS in the future.  I first met him on 05/04/2013, which time I felt he was stable. I did not suggest any new medication changes at the time and encouraged him to  continue with regular exercise. I also asked him to be regular with his medication regimen, which includes amantadine 100 mg once daily, rasagiline 1 mg once daily, Stalevo 100 mg strength one tablet 4 times a day namely at 6 to 6:30, 10 to 11, 2 or 2:30 PM, between 6 to 7 PM, no later than 7:30 PM and Mirapex 1.5 mg strength half a tablet 4 times a day.   He previously followed with Dr. Morene Antu and was last seen by Dr. Erling Cruz on 11/11/2012, at which time Dr. Erling Cruz felt that he was doing very well. He did not make any medication changes. The patient did report some daytime somnolence and was advised to take a midday nap. The patient has  an underlying medical history of right fourth nerve palsy, and Parkinson's disease.  He was diagnosed with left sided Parkinson's disease in September 2000 and initially treated with selegiline and Mirapex. Sinemet was added in June 2005 and rasagiline was added and 2008. He was then switched to Stalevo. 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. He has symptoms suggestive of RBD. He had a sleep study in February 2011 which showed no sleep apnea, frequent PLMS and evidence of RBD. He denies hallucinations, agitation or delusions, postural dizziness, depression, swelling in his legs and impulsive behaviors, recurrent falls or memory loss. He exercises regularly.  He has occasional drooling at night. He teaches full time at [employer] and plays golf regularly. He does work out with [employer] at [employer]T at [facility]  His Past Medical History Is Significant For: Past Medical History:  Diagnosis Date  . Allergy   . Asthma   . Parkinson's disease ([place])    Congenital R fourth nerve palsy    His Past Surgical History Is Significant For: Past Surgical History:  Procedure Laterality Date  . SHOULDER SURGERY    . TONSILLECTOMY    . VASECTOMY    . vastectomy      His Family History Is Significant For: Family History  Problem Relation Age of Onset   . Stroke Maternal Grandmother   . Dementia Mother     His Social History Is Significant For: Social History   Social History  . Marital status: Married    Spouse name: Zachary [name]  . Number of children: 3  . Years of education: Ph.D   Occupational History  .  [employer]   Social History Main Topics  . Smoking status: Never Smoker  . Smokeless tobacco: Never Used  . Alcohol use 0.0 oz/week     Comment: weekly  . Drug use: No  . Sexual activity: Not Asked   Other Topics Concern  . None   Social History Narrative   Pt lives at home with family.   Caffeine Use: 1-2 cups daily.    His Allergies Are:  Allergies  Allergen Reactions  . Tree Extract   :   His Current Medications Are:  Outpatient Encounter Prescriptions as of 09/25/2016  Medication Sig  . albuterol (PROVENTIL HFA;VENTOLIN HFA) 108 (90 Base) MCG/ACT inhaler Inhale 1-2 puffs into the lungs every 4 (four) hours as needed for wheezing or shortness of breath.  Marland Kitchen amantadine (SYMMETREL) 100 MG capsule Take 1 capsule (100 mg total) by mouth daily.  Marland Kitchen azelastine (OPTIVAR) 0.05 % ophthalmic solution Place 1 drop into both eyes 2 (two) times daily.  . carbidopa-levodopa (SINEMET CR) 50-200 MG tablet Take 1 tablet by mouth at bedtime.  . carbidopa-levodopa-entacapone (STALEVO 100) 25-100-200 MG tablet Take at 6, 9, 12, 3 PM, 6 PM and 9 PM. (Patient taking differently: Take at 6, 9, 12, 3 PM, 6 PM.)  . pramipexole (MIRAPEX) 1 MG tablet Take 1 tablet (1 mg total) by mouth 3 (three) times daily.  . rasagiline (AZILECT) 1 MG TABS tablet Take 1 tablet (1 mg total) by mouth daily.  . [DISCONTINUED] carbidopa-levodopa (SINEMET IR) 25-100 MG tablet Take 1 tablet by mouth at bedtime. In lieu of Sinemet CR 50/200, while on backorder   No facility-administered encounter medications on file as of 09/25/2016.   :  Review of Systems:  Out of a complete 14 point review of systems, all are reviewed and negative with the exception of these  symptoms as  listed below: Review of Systems  Neurological:       Patient is back to taking Sinemet CR 1 at night (after medication shortage at pharmacy). He only takes Sinemet IR 5 times a day, does not take the 9pm dose.   Patient would like to discuss exercise and medications today. Also states that he is trying to find a specialist at [facility], he would like to talk about that today as well.     Objective:  Neurologic Exam  Physical Exam Physical Examination:   Vitals:   09/25/16 0838  BP: (!) 104/58  Pulse: 70  Resp: 14   General Examination: The patient is a very pleasant 65 y.o. male in no acute distress.  HEENT: Normocephalic, atraumatic, pupils are equal, round and reactive to light and accommodation. Extraocular tracking shows mild saccadic breakdown without nystagmus noted. There is no limitation to excursion of his gaze. There is mild decrease in eye blink rate. Hearing is intact. Face is symmetric with mild facial masking and normal facial sensation. There is no lip, neck or jaw tremor. Neck is moderately rigid with intact passive ROM. There are no carotid bruits on auscultation. Oropharynx exam reveals mild mouth dryness. No significant airway crowding is noted. Mallampati is class II. Tongue protrudes centrally and palate elevates symmetrically. He has a mild head tilt to the R, unchanged. There is no drooling. Speech mild to moderately hypophonic.  Chest: is clear to auscultation without wheezing, rhonchi or crackles noted.  Heart: sounds are regular and normal without murmurs, rubs or gallops noted.   Abdomen: is soft, non-tender and non-distended with normal bowel sounds appreciated on auscultation.  Extremities: There is no pitting edema in the distal lower extremities bilaterally. Pedal pulses are intact. There are no varicose veins.  Skin: is warm and dry with no trophic changes noted.    Musculoskeletal: exam reveals no obvious joint deformities, tenderness,  joint swelling or erythema.  Neurologically:  Mental status: The patient is awake and alert, paying good attention. He is able to completely provide the history. He is oriented to: person, place, time/date, situation, day of week, month of year and year. His memory, attention, language and knowledge are well preserved. There is no aphasia, agnosia, apraxia or anomia. There is perhaps mild bradyphrenia. Speech is mild to moderately hypophonic with no dysarthria noted. Mood is congruent and affect is normal.    Cranial nerves are as described above under HEENT exam. In addition, shoulder shrug is normal with right shoulder slightly higher than left.  Motor exam: Normal bulk, and strength for age is noted. There are no dyskinesias noted.   Tone is mild to moderately rigid with presence of cogwheeling in the left upper extremity > RUE. There is overall mild bradykinesia. There is no drift or rebound.  There is an intermittent resting tremor in the LUE and to a lesser degree RLE today.   Reflexes are 1+ in the upper extremities and 1+ in the lower extremities.   Fine motor skills exam: Finger taps are mild to moderately impaired on the right and moderately impaired on the left. Hand movements are mildly impaired on the right and moderately impaired on the left. RAP (rapid alternating patting) is mildly impaired on the right and mild to moderately impaired on the left. Foot taps are moderately impaired on the right and moderately impaired on the left, worse than right. Foot agility (in the form of heel stomping) is moderately impaired on the right and mild to  moderately impaired on the left.    Cerebellar testing shows no dysmetria or intention tremor on finger to nose testing. Heel to shin is unremarkable bilaterally. There is no truncal or gait ataxia.   Sensory exam is intact to light touch in the upper and lower extremities.   Gait, station and balance: He stands up from the seated position with no  significant difficulty and does not need to push up with His hands. He needs no assistance. No veering to one side is noted. He is noted to lean to the right. Posture is mild to moderately stooped, slightly worse and slight R tilt noted also a little worse, some start hesitation. Stance is narrow-based. He walks with decrease in stride length but good pace and decreased arm swing on the left, with more pronounced hand tremor on the L. He turns in en bloc. Balance is slightly impaired.    Assessment and Plan:   In summary, Zachary [name] is a very pleasant 65 year old male with a history of Parkinson's disease, left-sided predominant, likely in its 65 th or 41 th year at this time, was diagnosed in September 2000. Symptoms date back to when he was around 89. His exam has progressed with time as expected, but overall he has done rather well over the past years, but medications had to be adjusted with time. He retired the summer. He has had more freezing, more posture changes and more tremors. He is very active physically. He has had indication of impulse control disorder, likely linked to his dopamine agonist. We mutually agreed to keep his medications the same, Stalevo 1 pill 6 times a day, every 3 hours starting at 6 or 7 AM. He will continue with CR Sinemet at night, amantadine once daily, Azilect once daily. The only exception is that I would like for him to reduce Mirapex 1 mg 3 times a day gradually to 0.5 mg tid. I refilled all meds and gave him a titration schedule for the mirapex. We talked about dopamine dysregulation quite a bit today. He is advised, that some of his symptoms may increase as we reduce the DA. I asked him to keep his appt with Dr. Tonye Royalty in March and he can also discuss DBS with him (patient feels he is not there yet, but is still encouraged to ask about candidacy for it). I would like to see him back in 2 months, sooner as needed. I answered all their questions today and patient and his  wife were in agreement. REM behavior disorder, potential ICD, and sleep, mood, and memory.  I spent 25 minutes in total face-to-face time with the patient, more than 50% of which was spent in counseling and coordination of care, reviewing test results, reviewing medication and discussing or reviewing the diagnosis of PD, its prognosis and treatment options.

## 2016-10-14 ENCOUNTER — Other Ambulatory Visit: Payer: Self-pay | Admitting: Neurology

## 2016-10-14 DIAGNOSIS — G20 Parkinson's disease: Secondary | ICD-10-CM

## 2016-11-18 ENCOUNTER — Other Ambulatory Visit: Payer: Self-pay | Admitting: Neurology

## 2016-11-18 DIAGNOSIS — G20 Parkinson's disease: Secondary | ICD-10-CM

## 2016-11-20 ENCOUNTER — Other Ambulatory Visit: Payer: Self-pay | Admitting: Neurology

## 2016-11-20 DIAGNOSIS — G20 Parkinson's disease: Secondary | ICD-10-CM

## 2016-12-17 ENCOUNTER — Ambulatory Visit (INDEPENDENT_AMBULATORY_CARE_PROVIDER_SITE_OTHER): Payer: Medicare Other | Admitting: Neurology

## 2016-12-17 ENCOUNTER — Encounter: Payer: Self-pay | Admitting: Neurology

## 2016-12-17 DIAGNOSIS — G20 Parkinson's disease: Secondary | ICD-10-CM | POA: Diagnosis not present

## 2016-12-17 MED ORDER — PRAMIPEXOLE DIHYDROCHLORIDE 0.5 MG PO TABS: 0.5000 mg | ORAL_TABLET | Freq: Three times a day (TID) | ORAL | 5 refills | Status: DC

## 2016-12-17 MED ORDER — RASAGILINE MESYLATE 1 MG PO TABS: 1.0000 mg | ORAL_TABLET | Freq: Every day | ORAL | 5 refills | Status: DC

## 2016-12-17 NOTE — Patient Instructions (Addendum)
We will continue for now with your medication regimen till you have a chance to [facility].  We may phase you out of amantadine altogether and increase your Stalevo to 125 mg strength, 1 pill 5 times a day next.   Please let me know once you have had the appt with Dr. Zola ButtonHaq, we will consider making some changes in your medication regimen. You can email me through my chart.

## 2016-12-17 NOTE — Progress Notes (Signed)
Subjective:    Patient ID: Zachary [name] is a 66 y.o. male.  HPI     Interim history:   Zachary [name] is a very pleasant 66 year-old right-handed gentleman with an underlying medical history of asthma and congenital right fourth nerve palsy, who presents for followup consultation of his left-sided predominant Parkinson's disease, originally diagnosed in September 2000. He is unaccompanied today. I last saw him on 09/25/2016, at which time he reported exercising 6 days a week, this included boxing classes twice a week and at [facility] exercise group once a week and 45 minutes of exercise on other days including golfing, mood and memory were stable, sleep is fairly stable. He did have some obsessive preoccupation with woman's clothes which was causing some trouble at home. He had reduced Stalevo by 1 pill in the past 6 weeks or so, he made an appointment with Dr. Tonye Royalty at [facility] in March 2018. He had fallen at the grocery store but thankfully had no injuries. Based on his report of OCD type symptoms of reduced his Mirapex gradually. I asked him to continue with Stalevo, Sinemet, amantadine and Azilect.  Today, 12/17/2016 (all dictated new, as well as above notes, some dictation done in note pad or Word, outside of chart, may appear as copied):  He reports worsening of his Parkinson's symptoms. He has more freezing episodes, and feels that the medication does not often last as long. Of note, we had not made any other changes last time other than reducing the Mirapex and we did discuss worsening of his PD symptoms at the time. On the positive side, his OCD symptoms improved. He has no new issues with respect to balance, constipation, sleep, memory or mood. He is still very active physically. He is trying to lose a little bit of weight.  Previously (copied from previous notes for reference):   I saw him on 05/21/2016, at which time he reported more freezing, but was very active, retired in  the summer. Did not always drink enough water per wife, some mild memory issues, organizational skill issues, driving only locally during daytime hours. In the interim, he was seen at urgent care by Dr. Carlota Raspberry on 03/01/2016 for an upper respiratory infection. We mutually agreed to keep his meds the same.    I saw him on 01/18/2016, at which time he reported increase in Parkinson symptoms and wearing off symptoms for the past 2 months. He was going to return soon. He was not doing very well with his eating habits and drinking water. He was exercising 6 days a week with a trainer and that was going well. He was not always on time with his medication schedule. He was generally speaking taking his Stalevo at 6 AM, 9 AM, 12, 4 PM and 8 PM, Sinemet CR at night around bedtime. He was taking amantadine in the morning and Azilect in the morning and Mirapex at 6, 12 and 8. He had a recent near fainting spell while at church. He had no falls. He had lost his mother in February 2017.   I suggested we increase his Stalevo to 1 pill 6 times a day, starting at 6 AM, every 3 hours. He was advised to exercise regularly, not to overdo it, and hydrate better.   I saw him on 09/19/2015, at which time he reported doing fairly well. He had not had any falls. He did endorse more stress especially with respect to his 29 year old mother's healthcare issues.  He was using Stalevo 5 times a day. He was not always able to keep a scheduled for his medication. He had more freezing. He had mild memory issues. He thought he was sleeping a little better since increasing the Stalevo. Mood wise he was stable. 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 I suggested he take Stalevo 100 mg 5 times a day, at 5:30, 8:30, 11:30 PM, 2:30 and 5:30. I added an evening dose of Sinemet CR 50/200 mg at 8:30 PM, and he was advised to continue to take Mirapex 1 mg 3 times a day, at 8:30, 2:30 and 8:30 PM, take Amantadine 100 mg 1 pill in  AM, at 5:30 AM, and take Azilect 1 mg 1 pill daily.   I saw him on 04/26/15 at which time he reported more tremors. He had noted more difficulty with balance especially at night when he has to get up to use the bathroom. He was taking his Stalevo 3-1/2 to 4-1/2 hours apart, 100 mg strength. When he tried to reduce the Mirapex he felt worse so he went back up to half a pill 4 times a day. He was on Azilect once daily and amantadine 100 mg once daily. He was teaching full-time. He would like to go on for another year and then retire. He drives well but tries to stick with shorter distances and daytime only. His wife reported an increase in nighttime dream enactments, at times almost nightly with flailing movements and sometimes loud yelling reported. He was not aware of this. He had no mood related issues. I suggested we increase his Stalevo to 5 times a day, about every 4 hours apart. 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 He felt, he would go for another year. He was plaing golf. He complained of sleepiness. He was not driving at night or long distances. He had decreased his Stalevo and his Mirapex to only 3 times a day for about 4 days and noticed an improvement in his sleepiness but his stiffness and motor symptoms became worse so he increased it again. He was not drinking enough water. He had no lower extremity swelling, no significant hallucinations, no mood disorder, no significant memory issues, and no evidence of impulse control disorder. I suggested he continue with Azilect 1 mg once daily and amantadine 100 mg once daily but asked him to decrease his Mirapex to half a pill 3 times a day, because of his sleepiness.   I saw him on 06/22/2014, at which time he reported overall doing well but noticing a gradual increase in stiffness, freezing and slowness. He was  working full-time as an Psychologist, sport and exercise. His father recently passed away at the age of 26. His mother was in independent living and has a history of dementia. He was working out regularly every week. I felt he was doing well enough to continue with the current regimen of Azilect, Mirapex and Stalevo. He was considering retirement. We talked about DBS surgery as well.     I saw him on 11/03/2013, at which time he reported no issues with his sleep, no constipation, no falls, no memory issues, no mood issues and he stopped taking vitamin D. He had noted an increase in stiffness and little increase in tremor and mild wearing off. He had called out in his dreams  recently 2 times, but never has fallen out of bed. I did not make any medication changes. We talked about the possibility of pursuing DBS in the future.   I first met him on 05/04/2013, which time I felt he was stable. I did not suggest any new medication changes at the time and encouraged him to continue with regular exercise. I also asked him to be regular with his medication regimen, which includes amantadine 100 mg once daily, rasagiline 1 mg once daily, Stalevo 100 mg strength one tablet 4 times a day namely at 6 to 6:30, 10 to 11, 2 or 2:30 PM, between 6 to 7 PM, no later than 7:30 PM and Mirapex 1.5 mg strength half a tablet 4 times a day.    He previously followed with Dr. Morene Antu and was last seen by Dr. Erling Cruz on 11/11/2012, at which time Dr. Erling Cruz felt that he was doing very well. He did not make any medication changes. The patient did report some daytime somnolence and was advised to take a midday nap. The patient has an underlying medical history of right fourth nerve palsy, and Parkinson's disease.   He was diagnosed with left sided Parkinson's disease in September 2000 and initially treated with selegiline and Mirapex. Sinemet was added in June 2005 and rasagiline was added and 2008. He was then switched to Stalevo. 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. He has symptoms suggestive of RBD. He had a sleep study in February 2011 which showed no sleep apnea, frequent PLMS and evidence of RBD. He denies hallucinations, agitation or delusions, postural dizziness, depression, swelling in his legs and impulsive behaviors, recurrent falls or memory loss. He exercises regularly.   He has occasional drooling at night. He teaches full time at [employer] and plays golf regularly. He does work out with [employer] at [employer] at [facility].  The patient's allergies, current medications, family history, past medical history, past social history, past surgical history and problem list were reviewed and updated as appropriate.    His Past Medical History Is Significant For: Past Medical History:  Diagnosis Date  . Allergy   . Asthma   . Parkinson's disease ([place])    Congenital R fourth nerve palsy    Her Past Surgical History Is Significant For: Past Surgical History:  Procedure Laterality Date  . SHOULDER SURGERY    . TONSILLECTOMY    . VASECTOMY    . vastectomy      His Family History Is Significant For: Family History  Problem Relation Age of Onset  . Stroke Maternal Grandmother   . Dementia Mother     His Social History Is Significant For: Social History   Social History  . Marital status: Married    Spouse name: [name]  . Number of children: 3  . Years of education: Ph.D   Occupational History  .  [employer]   Social History Main Topics  . Smoking status: Never Smoker  . Smokeless tobacco: Never Used  . Alcohol use 0.0 oz/week     Comment: weekly  . Drug use: No  . Sexual activity: Not Asked   Other Topics Concern  . None   Social History Narrative   Pt lives at home with family.   Caffeine Use: 1-2 cups daily.    His Allergies Are:  Allergies  Allergen Reactions  . Tree Extract   :   His Current Medications Are:  Outpatient Encounter Prescriptions as of 12/17/2016  Medication Sig   . albuterol (PROVENTIL HFA;VENTOLIN HFA) 108 (90 Base) MCG/ACT inhaler Inhale 1-2 puffs into the lungs every 4 (four) hours as needed for wheezing or shortness of breath.  Marland Kitchen amantadine (SYMMETREL) 100 MG capsule Take 1 capsule (100 mg total) by mouth daily.  . carbidopa-levodopa (SINEMET CR) 50-200 MG tablet Take 1 tablet by mouth at bedtime.  . carbidopa-levodopa-entacapone (STALEVO 100) 25-100-200 MG tablet Take at 6, 9, 12, 3 PM, 6 PM and 9 PM.  . cetirizine (ZYRTEC) 10 MG tablet Take 10 mg by mouth daily.  . pramipexole (MIRAPEX) 1 MG tablet Take 0.5 tablets (0.5 mg total) by mouth 3 (three) times daily.  . rasagiline (AZILECT) 1 MG TABS tablet TAKE 1 TABLET DAILY.  Marland Kitchen azelastine (OPTIVAR) 0.05 % ophthalmic solution Place 1 drop into both eyes 2 (two) times daily. (Patient not taking: Reported on 12/17/2016)   No facility-administered encounter medications on file as of 12/17/2016.   :  Review of Systems:  Out of a complete 14 point review of systems, all are reviewed and negative with the exception of these symptoms as listed below: Review of Systems  Neurological:       Pt presents today to discuss the worsening of his PD. Pt says that he has noticed more freezing episodes and feels that he "contends with my parkinson's every hour of every day."    Objective:  Neurologic Exam  Physical Exam Physical Examination:   Vitals:   12/17/16 0903  BP: 97/67  Pulse: 74  Resp: 20   General Examination: The patient is a very pleasant 66 y.o. male in no acute distress. He appears well-developed and well-nourished and well groomed.   HEENT: Normocephalic, atraumatic, pupils are equal, round and reactive to light and accommodation. Extraocular tracking shows mild saccadic breakdown. Face is symmetric with mild facial masking. He has no significant neck or jaw or lip tremor. He has mild mouth dryness, speech is moderately hypophonic, no dysarthria, mild right laterocollis. No oral facial  dyskinesias, no drooling.   Chest: Clear to auscultation without wheezing, rhonchi or crackles noted.  Heart: S1+S2+0, regular and normal without murmurs, rubs or gallops noted.   Abdomen: Soft, non-tender and non-distended with normal bowel sounds appreciated on auscultation.  Extremities: There is no pitting edema in the distal lower extremities bilaterally. Pedal pulses are intact.  Skin: Warm and dry without trophic changes noted.  Musculoskeletal: exam reveals no obvious joint deformities, tenderness or joint swelling or erythema.   Neurologically:  Mental status: The patient is awake, alert and oriented in all 4 spheres. His immediate and remote memory, attention, language skills and fund of knowledge are appropriate. There is no evidence of aphasia, agnosia, apraxia or anomia. Speech is clear with normal prosody and enunciation. Thought process is linear. Mood is normal and affect is normal.  Cranial nerves II - XII are as described above under HEENT exam. In addition: shoulder shrug is normal with equal shoulder height noted. Motor exam: Normal bulk, strength. He has a resting tremor in both upper extremities, intermittent, mild to moderate. He has an increase in tone in both upper extremities, some cogwheeling noted. He has no lower extremity tremor. There is no drift, tremor or rebound. Romberg is negative. Reflexes are 1+ throughout. Fine motor skills and coordination: He has moderate impairment of finger taps, hand movements and rapid alternating patting on the left, slightly better on the right. Foot taps and foot agility are moderately impaired on the left and mildly  to moderately impaired on the right. He has no additional cerebellar signs such as dysmetria or intention tremor.   Sensory exam: intact to light touch in the upper and lower extremities.  Gait, station and balance: He stands with difficulty, and has to push himself up, requires 2 attempts, requires no assistance.  Posture is moderately stooped, slightly worse, slight right tilt noted. He has difficulty initiating his gait and has several stutter steps, then walks fairly good with good pace but decreased arm swing on the left, almost delivered arm swing noted bilaterally once he gets moving, balance is mildly impaired. He has some difficulty turning.               Assessment and plan:   In summary, Zachary [name] is a very pleasant 66 y.o.-year old male, Retired Psychologist, sport and exercise, with an underlying medical history of mild and usually well controlled asthma and congenital right fourth nerve palsy, who presents for follow-up consultation of his left-sided predominant Parkinson's disease, most likely in his 16th year at this time, he was diagnosed in September 2000. Symptoms date back when he was about 67 years old. He has had motor and nonmotor symptoms over time. He has developed more posture changes and freezing. We reduced his Mirapex to 0.5 mg 3 times a day from 1 mg 3 times a day because of some obsessive preoccupations which have improved. We will continue with the current dose. Nevertheless, he does have a mild progression in this motor exam. He has been on Stalevo 100 mg strength one pill 5 times a day and Sinemet CR at night. He takes 1 mg of Azilect generic in the morning and amantadine 100 mg in the morning. He has an appointment for second opinion with Dr. Tonye Royalty at [facility] in about 5-6 weeks from now and we mutually agreed to continue with his current medication regimen. We may consider phasing amount of amantadine altogether and increase his Stalevo to 125 mg strength. We will wait out Dr. Fayne Mediate assessment and recommendations. He is advised to call me after his appointment next month is completed.   I renewed his prescription for Mirapex and Azilect today, he did not need any refills on the other prescriptions. I encouraged him to email me through my chart as well. I will see him back routinely in 3  months, sooner as needed. We will be in touch over the phone or email in the interim. I answered all his questions today and he was in agreement.  I spent 25 minutes in total face-to-face time with the patient, more than 50% of which was spent in counseling and coordination of care, reviewing test results, reviewing medication and discussing or reviewing the diagnosis of PD, its prognosis and treatment options. Pertinent laboratory and imaging test results that were available during this visit with the patient were reviewed by me and considered in my medical decision making (see chart for details).

## 2016-12-21 ENCOUNTER — Encounter: Payer: Self-pay | Admitting: Neurology

## 2016-12-21 ENCOUNTER — Telehealth: Payer: Self-pay | Admitting: Neurology

## 2016-12-21 NOTE — Telephone Encounter (Signed)
Appointment Request From: Earl ManyStuart D. Freida BusmanAllen    With Provider: Huston FoleyATHAR, SAIMA, MD [[facility]]    Preferred Date Range: Any date 12/20/2016 or later    Preferred Times: Any    Reason: To address the following health maintenance concerns.  Hepatitis C Screening    Comments:  QUESTIONS /OBSERVATIONS    Why does mychart not have the information from Dr. Clelia CroftShaw that I have had a  used) second colonoscopony done locally at the age of 89 or [age]     I recieved flu shots in the fall of 2016 and the fall of 2017    A Pneumonia shot was also administered - and probably a follow up shot in 2017 -

## 2016-12-21 NOTE — Telephone Encounter (Signed)
I called pt. I advised him that Dr. Clelia CroftShaw is not a part of [facility], and that is why his immunizations and colonoscopy are not already on mychart with [facility]. I advised him that I would add this information. Pt says that he will call Dr. Alver FisherShaw's office to make sure their records are correct. Pt verbalized understanding.

## 2017-03-18 ENCOUNTER — Ambulatory Visit: Payer: Medicare Other | Admitting: Neurology

## 2017-04-03 DIAGNOSIS — K5900 Constipation, unspecified: Secondary | ICD-10-CM | POA: Insufficient documentation

## 2017-07-25 ENCOUNTER — Encounter: Payer: Self-pay | Admitting: Neurology

## 2017-07-30 ENCOUNTER — Telehealth: Payer: Self-pay | Admitting: Neurology

## 2017-07-30 NOTE — Telephone Encounter (Signed)
Aware we have access to Dr. Maryln Gottron notes.

## 2017-07-30 NOTE — Telephone Encounter (Signed)
This patient will be coming in to see Dr. Arbutus Leas on 08/23/17 as a New Patient. He was wanting to male sure that his paperwork was received from Dr. Zola Button? Please Advise. Thanks

## 2017-08-21 NOTE — Progress Notes (Signed)
CURREN MOHRMANN was seen today in the movement disorders clinic for neurologic consultation at the request of Haq, Sharmaine Base, MD.  The patient is accompanied by his wife who supplements the history.  The consultation is for the evaluation of PD.  Patient has seen Dr. Rexene Alberts and Dr. Tonye Royalty previously.  I have reviewed an extensive number of records available to me.  Patient has not been seeing Dr. Tonye Royalty very long and just started seeing him in March, 2018.  His last visit was on Feb 26, 2017.  The patient believes that his first symptom was decreased left arm swing in 1998 or 1999.  He did go to urgent care in approximately the year 2000 and they told him that they thought he had Parkinson's disease.  He was formally diagnosed in September, 2000.  He was subsequently followed by Dr. Erling Cruz.  Records indicate that the patient was initially treated with selegiline and pramipexole.  Levodopa was added in 2005.  Azilect in 2008.  At some point in time, he was changed to Stalevo and amantadine was added.  He followed with Dr. Erling Cruz until he retired and then was transferred to Dr. Rexene Alberts in 2014.  He transferred to Dr. Tonye Royalty in March, 2018.  DBS has been discussed many times over the years, but the patient has not wanted to proceed.  The patient developed motor freezing in approximately 2016.  He is currently on Stalevo 100, 1 tablet at 6:30 AM/9 AM/11:30am/2 PM/4:30/7 PM. He takes carbidopa/levodopa 50/200 CR at bedtime (9:30 PM).  He is on pramipexole 0.5 mg tid.  Dr. Tonye Royalty stopped amantadine last visit because of cognitive changes.  Pt didn't do that and is on 100 mg q day.  Instead he started taking hemp oil and he didn't want to change 2 things at once.   Specific Symptoms:  Tremor: Yes.  , L hand occasionally Family hx of similar:  Yes.   - "probably my mother" Voice: yes, has not done voice therapy Sleep: sleeping well  Vivid Dreams:  No.  Acting out dreams:  Yes.   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 Wet Pillows: occasionally Postural symptoms:  Yes.   , but "not bad given 19 years of diagnosis"  Falls?  Yes.   , 2 in the last year - was festinating in church parking lot Bradykinesia symptoms: shuffling gait and difficulty getting out of a chair Loss of smell:  Yes.   Loss of taste:  Yes.   Urinary Incontinence:  No. (occasional leakage and will wear depends) Difficulty Swallowing:  No. Handwriting, micrographia: Yes.   Trouble with ADL's:  No.  (rare trouble)  Trouble buttoning clothing: No. Depression:  No. Memory changes:  Yes.   ("where are the keys?"  Wife now in charge of meds) Hallucinations:  No.  visual distortions: Yes.   N/V:  No. Lightheaded:  Yes.    Syncope: No. Diplopia:  Yes.  , can blink it away Dyskinesia:  No.  Neuroimaging of the brain has previously been performed.  It is not available for my review today.  MRI of the brain was done on Mar 28, 2017 at [facility].  I have the reports but not the films.  It was reported to show mild small vessel disease.  PREVIOUS MEDICATIONS: selegiline, rasagiline, pramipexole, carbidopa/levodopa, Stalevo, pramipexole, amantadine   ALLERGIES:   Allergies  Allergen Reactions  . Tree Extract     CURRENT MEDICATIONS:  Outpatient Encounter Prescriptions as of 08/23/2017  Medication Sig  . amantadine (SYMMETREL) 100 MG capsule Take 1 capsule (100 mg total) by mouth daily.  . carbidopa-levodopa (SINEMET CR) 50-200 MG tablet Take 1 tablet by mouth at bedtime.  . carbidopa-levodopa-entacapone (STALEVO 100) 25-100-200 MG tablet Take at 6, 9, 12, 3 PM, 6 PM and 9 PM.  . cetirizine (ZYRTEC) 10 MG tablet Take 10 mg by mouth daily.  . Multiple Vitamins-Minerals (ZINC PO) Take by mouth.  . pramipexole (MIRAPEX) 0.5 MG tablet Take 1 tablet (0.5 mg total) by mouth 3 (three) times daily.  . rasagiline (AZILECT) 1 MG TABS tablet  Take 1 tablet (1 mg total) by mouth daily.  Marland Kitchen UNABLE TO FIND Hemp 35 mg in the morning, 15 mg in the afternoon  . [DISCONTINUED] albuterol (PROVENTIL HFA;VENTOLIN HFA) 108 (90 Base) MCG/ACT inhaler Inhale 1-2 puffs into the lungs every 4 (four) hours as needed for wheezing or shortness of breath.  . [DISCONTINUED] azelastine (OPTIVAR) 0.05 % ophthalmic solution Place 1 drop into both eyes 2 (two) times daily. (Patient not taking: Reported on 12/17/2016)   No facility-administered encounter medications on file as of 08/23/2017.     PAST MEDICAL HISTORY:   Past Medical History:  Diagnosis Date  . Allergy   . Asthma   . Parkinson's disease ([place])    Congenital R fourth nerve palsy    PAST SURGICAL HISTORY:   Past Surgical History:  Procedure Laterality Date  . CATARACT EXTRACTION, BILATERAL    . COLONOSCOPY    . SHOULDER SURGERY Left    lymph nodes  . TONSILLECTOMY    . VASECTOMY      SOCIAL HISTORY:   Social History   Social History  . Marital status: Married    Spouse name: [name]  . Number of children: 3  . Years of education: Ph.D   Occupational History  . 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 Smoking status: Never Smoker  . Smokeless tobacco: Never Used  . Alcohol use 0.0 oz/week     Comment: two drinks a week  . Drug use: No     Comment: hemp oil  . Sexual activity: Not on file   Other Topics Concern  . Not on file   Social History Narrative   Pt lives at home with family.   Caffeine Use: 1-2 cups daily.    FAMILY HISTORY:   Family Status  Relation Status  . Mother Deceased  . Father Deceased at age 57       lungs, kidney  . Brother Alive  . MGM (Not Specified)  . Child Alive    ROS:  A complete 10 system review of systems was obtained and was unremarkable apart from what is mentioned above.  PHYSICAL EXAMINATION:    VITALS:   Vitals:   08/23/17 0936  BP: 100/70  Pulse: 88  SpO2: 95%  Weight: 168 lb (76.2  kg)  Height: 5' 9.5" (1.765 m)    GEN:  The patient appears stated age and is in NAD. HEENT:  Normocephalic, atraumatic.  The mucous membranes are moist. The superficial temporal arteries are without ropiness or tenderness. CV:  RRR Lungs:  CTAB Neck/HEME:  There are no carotid bruits bilaterally.  Neurological examination:  Orientation: The patient is alert and oriented x3. Fund of knowledge is appropriate.  Recent and remote  memory are intact.  Attention and concentration are normal.    Able to name objects and repeat phrases. Cranial nerves: There is good facial symmetry. He has facial hypomimia.  Pupils are equal round and reactive to light bilaterally. Fundoscopic exam reveals clear margins bilaterally. Extraocular muscles are intact. The visual fields are full to confrontational testing. The speech is fluent and clear.   He is hypophonic.  The patient is able to make the gutteral sounds without difficulty.  Soft palate rises symmetrically and there is no tongue deviation. Hearing is intact to conversational tone. Sensation: Sensation is intact to light and pinprick throughout (facial, trunk, extremities). Vibration is intact at the bilateral big toe. There is no extinction with double simultaneous stimulation. There is no sensory dermatomal level identified. Motor: Strength is 5/5 in the bilateral upper and lower extremities.   Shoulder shrug is equal and symmetric.  There is no pronator drift. Deep tendon reflexes: Deep tendon reflexes are 2-/4 at the bilateral biceps, triceps, brachioradialis, patella and achilles. Plantar responses are downgoing bilaterally.  Movement examination: Tone: There is mod increased tone in the bilateral upper extremities.  The tone in the lower extremities is increased.  Abnormal movements: none even with distraction but does have LUE tremor with ambulation Coordination:  There is decremation with RAM's, initially with finger taps bilaterally but when  reminded to keep them big he is able to.  He has significant decremation with toe taps and heel taps, L more than right. Gait and Station: The patient has mild difficulty arising out of a deep-seated chair without the use of the hands and falls back the first time. The patient's stride length is decreased with festinating gait.  He has tremor on the L with ambulation.  The patient has a positive pull test.      ASSESSMENT/PLAN:  1.  PD, dx 06/1999  -He is to increase his Stalevo as follows: 2 tablets at 6:30 am, 1 tablet at 9:00 am, 2 tablets at 11:30 am, 1 tablet at 2 pm, 1 tablet at 4:30 pm, 1 tablet at 7:00 pm.   -He will continue carbidopa/levodopa 50/200 CR at bedtime (930 PM).    -He will continue pramipexole 0.5 mg tid.  -The patient is currently using CBD oil.   Not recommended by [facility]AN because of lack of controlled trials.  Talked about smaller trials in which marijuana helped tremor.  However, there is some data that suggests that it worsens cognition and falls.  The data also suggests that CBD oil is less effective than marijuana.  However, there have been concerns given the fact that CBD oil is unregulated and each manufacturer has different amounts of ingredient and the purity of each manufacturers ingredient has been called into question.  At this time, it is not recommended for the treatment of Parkinson's disease.  Further studies do need to be completed.    -Patient is currently going to the [employer] and we talked about the fact that I was really proud of him for doing so much activity, but I also want to see him add in cardiovascular activity.  -He met with our Parkinson's disease social worker today.  2.  REM behavior disorder  -This is commonly associated with PD and the patient is experiencing this.  We discussed that this can be very serious and even harmful.  We talked about medications as well as physical barriers to put in the bed (particularly soft bed rails, pillow barriers).  We  talked  about moving the night stand so that it is not so close to the side of the bed.  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  3.  Cognitive change associated with PD  -suspect that there is more cognitive change than we see by simple tests due to high level of education.  Would recommend neurocognitive testing.  Patient was agreeable and we will refer him.  4.  I will plan on seeing the patient back in the next few months, sooner should new neurologic issues arise.  Greater than 50% of this 60-minute visit was spent in counseling with the patient and his wife.  This did not include the 40 min of record review which was detailed above, which was non face to face time.   Cc:  Marton Redwood, MD

## 2017-08-23 ENCOUNTER — Ambulatory Visit (INDEPENDENT_AMBULATORY_CARE_PROVIDER_SITE_OTHER): Payer: Medicare Other | Admitting: Neurology

## 2017-08-23 ENCOUNTER — Encounter: Payer: Self-pay | Admitting: Neurology

## 2017-08-23 ENCOUNTER — Encounter: Payer: Self-pay | Admitting: Psychology

## 2017-08-23 VITALS — BP 100/70 | HR 88 | Ht 69.5 in | Wt 168.0 lb

## 2017-08-23 DIAGNOSIS — G20 Parkinson's disease: Secondary | ICD-10-CM

## 2017-08-23 DIAGNOSIS — G4752 REM sleep behavior disorder: Secondary | ICD-10-CM

## 2017-08-23 DIAGNOSIS — R413 Other amnesia: Secondary | ICD-10-CM | POA: Diagnosis not present

## 2017-08-23 NOTE — Progress Notes (Signed)
I met with the patient and his wife today while they were in the clinic. They have attended some of the Parkinson's community programs in the past.  He currently does balance work 5/ week. We talked about alternating to cardio 3/week and balance 2/week.  He is planning on adding cardio via cycle possibly. Currently, he attends the [employer] with Amy.   His wife is formally retiring at the end of December. She is interested in caregiver support information as it becomes available.   They both have my contact information and are aware that I can be a resource to them at any time.

## 2017-08-23 NOTE — Patient Instructions (Signed)
1. Increase Stalevo as follows:  2 tablets at 6:30 am, 1 tablet at 9:00 am, 2 tablets at 11:30 am, 1 tablet at 2 pm, 1 tablet at 4:30 pm, 1 tablet at 7:00 pm.  2. You have been referred for a neurocognitive evaluation in our office.   The evaluation takes approximately two hours. The first part of the appointment is a clinical interview with the neuropsychologist (Dr. Elvis CoilMaryBeth Bailar). Please bring someone with you to this appointment if possible, as it is helpful for Dr. Alinda DoomsBailar to hear from both you and another adult who knows you well. After speaking with Dr. Alinda DoomsBailar, you will complete testing with her technician. The testing includes a variety of tasks- mostly question-and-answer, some paper-and-pencil. There is nothing you need to do to prepare for this appointment, but having a good night's sleep prior to the testing, and bringing eyeglasses and hearing aids (if you wear them), is advised.   About a week after the evaluation, you will return to follow up with Dr. Alinda DoomsBailar to review the test results. This appointment is about 30 minutes. If you would like a family member to receive this information as well, please bring them to the appointment.   We have to reserve several hours of the neuropsychologist's time and the psychometrician's time for your evaluation appointment. As such, please note that there is a No-Show fee of $100. If you are unable to attend any of your appointments, please contact our office as soon as possible to reschedule.

## 2017-09-12 ENCOUNTER — Other Ambulatory Visit: Payer: Self-pay | Admitting: Neurology

## 2017-09-12 DIAGNOSIS — G20 Parkinson's disease: Secondary | ICD-10-CM

## 2017-09-12 DIAGNOSIS — R413 Other amnesia: Secondary | ICD-10-CM

## 2017-09-12 MED ORDER — CARBIDOPA-LEVODOPA-ENTACAPONE 25-100-200 MG PO TABS: ORAL_TABLET | ORAL | 1 refills | Status: DC

## 2017-10-04 ENCOUNTER — Other Ambulatory Visit: Payer: Self-pay | Admitting: Neurology

## 2017-10-04 DIAGNOSIS — G20 Parkinson's disease: Secondary | ICD-10-CM

## 2017-10-07 ENCOUNTER — Other Ambulatory Visit: Payer: Self-pay | Admitting: Neurology

## 2017-10-07 DIAGNOSIS — G20 Parkinson's disease: Secondary | ICD-10-CM

## 2017-10-31 ENCOUNTER — Ambulatory Visit: Payer: Medicare Other | Admitting: Psychology

## 2017-10-31 ENCOUNTER — Encounter: Payer: Self-pay | Admitting: Psychology

## 2017-10-31 ENCOUNTER — Ambulatory Visit (INDEPENDENT_AMBULATORY_CARE_PROVIDER_SITE_OTHER): Payer: Medicare Other | Admitting: Psychology

## 2017-10-31 DIAGNOSIS — R413 Other amnesia: Secondary | ICD-10-CM | POA: Diagnosis not present

## 2017-10-31 DIAGNOSIS — G3184 Mild cognitive impairment, so stated: Secondary | ICD-10-CM | POA: Diagnosis not present

## 2017-10-31 DIAGNOSIS — G20 Parkinson's disease: Secondary | ICD-10-CM | POA: Diagnosis not present

## 2017-10-31 DIAGNOSIS — R41844 Frontal lobe and executive function deficit: Secondary | ICD-10-CM | POA: Diagnosis not present

## 2017-10-31 DIAGNOSIS — G20A1 Parkinson's disease without dyskinesia, without mention of fluctuations: Secondary | ICD-10-CM

## 2017-10-31 NOTE — Progress Notes (Signed)
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 Lurena Joiner [name] of [facility] due to concerns about cognitive changes in the context of Parkinson's disease. This patient is accompanied in the office by his wife who supplements the history.  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 He was followed by Dr. Sandria Manly for many years prior to his retirement. He has seen Dr. Arbutus Leas for one visit, on 08/23/2017. He endorsed mild cognitive changes. An MRI of the brain was completed in May 2018 at [facility] and reportedly showed mild small vessel disease.  At today's visit, the patient and his wife report relatively recent onset of cognitive changes in the past couple of years, gradual onset, more pronounced in recent months. His wife quit her job 3 months ago in order to be home with him more. She has also taken over the driving, management of medications and management of finances. The patient reported slowed processing speed, and noted that he recalls he was making more mistakes on spreadsheets in the afternoons before he retired 1.5 years ago. He endorsed increased difficulty concentrating and increased distractibility. He denied any language difficulty but did endorse speech articulation changes. His wife reported that he has memory lapses for whole events, frequently misplaces and loses items, and has demonstrated episodic confusion with odd behaviors in the past few months, although this has improved in the last couple of weeks. She reported the most concerning episode was in November when she went in the kitchen and found that he had squirted ketchup in the microwave and left the bottle in there, put her recipe cards in the sink, put a small decorative item in the nuts  container, and emptied all the nuts into her recipe box. He had no memory of having done that. Even when she speaks of it now, he does not recall her having told him about it in the past. She reports this was the most extreme episode/behavior but that there have been other times in recent months when he has done things and then not recalled afterwards (moving important items and then not recalling that he moved them). She noted that when this was happening frequently, they were somewhat discombobulated in their home as they were undergoing renovations and remodeling. Since that work is all complete and their house is back to normal, he does seem to be doing better in terms of the odd behavior.   His wife also reported that he loses his wallet frequently, accidentally takes others' golf clubs and clothing (thinking they are his own), and has had difficulty learning how to use their new microwave.  He has had several falls and has hit his head but never lost consciousness or suffered known head injury.   He has experienced a few visual hallucinations (e.g., talking to people who are not there, reaching for items that are not there) but these are "few and far between" per his wife and have never been upsetting to him.   He has REM sleep behavior disorder. He often calls out in his sleep. He feels rested upon awakening most of the time, but he doesn't like that he has to get up at 6:30 am to take his first dose of medication. He does go back to sleep afterwards sometimes. He  falls asleep easily during the day and takes several brief naps each day.   He endorses recent stress related to their home renovations but states his mood has returned to normal and is "even keel". His wife agrees that he is generally in a good mood although he does get frustrated at times. His appetite is increased since retiring. He has reduced his alcohol intake. He used to enjoy wine and now has pretty much stopped drinking.     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  Marital history: Married to his current wife for 25 years. He has 3 children and 1 step son. He is estranged from one son but close with his other children. He has 7 grandchildren. Alcohol: Minimal now (maybe half glass of wine per week) Tobacco: None, never a smoker   Medical History: Past Medical History:  Diagnosis Date  . Allergy   . Asthma   . Parkinson's disease ([facility])    Congenital R fourth nerve palsy     Current Medications:  Outpatient Encounter Medications as of 10/31/2017  Medication Sig  . amantadine (SYMMETREL) 100 MG capsule TAKE 1 CAPSULE DAILY.  . carbidopa-levodopa (SINEMET CR) 50-200 MG tablet Take 1 tablet by mouth at bedtime.  . carbidopa-levodopa-entacapone (STALEVO 100) 25-100-200 MG tablet 2 tablets at 6:30 am, 1 tablet at 9:00 am, 2 tablets at 11:30 am, 1 tablet at 2 pm, 1 tablet at 4:30 pm, 1 tablet at 7:00 pm  . cetirizine (ZYRTEC) 10 MG tablet Take 10 mg by mouth daily.  . Multiple Vitamins-Minerals (ZINC PO) Take by mouth.  . pramipexole (MIRAPEX) 0.5 MG tablet Take 1 tablet (0.5 mg total) by mouth 3 (three) times daily.  . rasagiline (AZILECT) 1 MG TABS tablet Take 1 tablet (1 mg total) by mouth daily.  Marland Kitchen. UNABLE TO FIND Hemp 35 mg in the morning, 15 mg in the afternoon   No facility-administered encounter medications on file as of 10/31/2017.      Behavioral Observations:   Appearance: Neatly, casually and appropriately dressed and groomed Gait: Ambulated with a cane Speech: Fluent; mildly reduced rate and volume. No significant word finding difficulty. Thought process: Linear, goal directed Affect: Blunted/masked Interpersonal: Pleasant, appropriate   TESTING: There is medical necessity to proceed with neuropsychological assessment as the results will be used to aid in differential  diagnosis and clinical decision-making and to inform specific treatment recommendations. 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  In considering his level of current functioning, level of presumed impairment, nature of symptoms, emotional and behavioral responses during the interview, level of literacy, observed level of motivation, and estimated above-average level of premorbid functioning, a battery of tests was selected and communicated to the psychometrician. Following the clinical interview/neurobehavioral status exam, the patient completed this full battery of neuropsychological testing with my psychometrician under my supervision. Communication was ongoing with the technician throughout the testing session and changes were made as deemed necessary based on the above listed factors, patient performance on testing and technician observations.   PLAN: The patient and his wife will return to see me for a follow-up session at which time his test performances and my impressions and treatment recommendations will be reviewed in detail.  Evaluation ongoing with full report to follow.

## 2017-10-31 NOTE — Progress Notes (Signed)
   Neuropsychology Note  Zachary LahStuart D Rodier completed 2 hours of neuropsychological testing with technician, Zachary [name], BS, under the supervision of Dr. Elvis CoilMaryBeth Bailar. The patient did not appear overtly distressed by the testing session, per behavioral observation or via self-report to the technician. He did demonstrate impulsivity during the evaluation in that he frequently would start tasks before the full instructions had been provided, and he would leave the office without warning to get a drink of water, etc. Additionally there was other evidence of difficulty with impulse control as he was observed to repeatedly take tissues from the tissue box, one after another, to blow his nose, in a somewhat stereotyped manner.    Zachary [name] will return within 2 weeks for a feedback session with Dr. Alinda [name] at which time his test performances, clinical impressions and treatment recommendations will be reviewed in detail. The patient understands he can contact our office should he require our assistance before this time.  120 minutes spent face-to-face with patient administering standardized tests Radiographer, therapeutic(technician). 60 minutes spent scoring Radiographer, therapeutic(technician).   Full report to follow.

## 2017-11-22 NOTE — Progress Notes (Signed)
NEUROPSYCHOLOGICAL EVALUATION   Name:    Zachary [name]  Date of Birth:   Jun 17, 1951 Date of Interview:  10/31/2017 Date of Testing:  10/31/2017   Date of Feedback:  11/26/2017       Background Information:  Reason for Referral:  Zachary [name] is a 67 y.o. male referred by Dr. Lurena Joiner [name] to assess his current level of cognitive functioning and assist in differential diagnosis. The current evaluation consisted of a review of available medical records, an interview with the patient and his wife, and the completion of a neuropsychological testing battery. Informed consent was obtained.  History of Presenting Problem:  Zachary [name] was formally diagnosed with Parkinson's disease in 06/1999. He was followed by Dr. Sandria Manly for many years prior to his retirement. He has seen Dr. Arbutus Leas for one visit, on 08/23/2017. He endorsed mild cognitive changes. An MRI of the brain was completed in May 2018 at [facility] and reportedly showed mild small vessel disease.  At today's visit, the patient and his wife report relatively recent onset of cognitive changes in the past couple of years, gradual onset, more pronounced in recent months. His wife quit her job 3 months ago in order to be home with him more. She has also taken over the driving, management of medications and management of finances. The patient reported slowed processing speed, and noted that he recalls he was making more mistakes on spreadsheets in the afternoons before he retired 1.5 years ago. He endorsed increased difficulty concentrating and increased distractibility. He denied any language difficulty but did endorse speech articulation changes. His wife reported that he has memory lapses for whole events, frequently misplaces and loses items, and has demonstrated episodic confusion with odd behaviors in the past few months, although this has improved in the last couple of weeks. She reported the most concerning episode was in November when she went in the  kitchen and found that he had squirted ketchup in the microwave and left the bottle in there, put her recipe cards in the sink, put a small decorative item in the nuts container, and emptied all the nuts into her recipe box. He had no memory of having done that. Even when she speaks of it now, he does not recall her having told him about it in the past. She reports this was the most extreme episode/behavior but that there have been other times in recent months when he has done things and then not recalled afterwards (moving important items and then not recalling that he moved them). She noted that when this was happening frequently, they were somewhat discombobulated in their home as they were undergoing renovations and remodeling. Since that work is all complete and their house is back to normal, he does seem to be doing better in terms of the odd behavior.   His wife also reported that he loses his wallet frequently, accidentally takes others' golf clubs and clothing (thinking they are his own), and has had difficulty learning how to use their new microwave.  He has had several falls and has hit his head but never lost consciousness or suffered known head injury.   He has experienced a few visual hallucinations (e.g., talking to people who are not there, reaching for items that are not there) but these are "few and far between" per his wife and have never been upsetting to him.   He has REM sleep behavior disorder. He often calls out in his sleep. He feels rested  upon awakening most of the time, but he doesn't like that he has to get up at 6:30 am to take his first dose of medication. He does go back to sleep afterwards sometimes. He falls asleep easily during the day and takes several brief naps each day.   He endorses recent stress related to their home renovations but states his mood has returned to normal and is "even keel". His wife agrees that he is generally in a good mood although he does  get frustrated at times. His appetite is increased since retiring. He has reduced his alcohol intake. He used to enjoy wine and now has pretty much stopped drinking.    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  Marital history: Married to his current wife for 25 years. He has 3 children and 1 step son. He is estranged from one son but close with his other children. He has 7 grandchildren. Alcohol: Minimal now (maybe half glass of wine per week) Tobacco: None, never a smoker   Medical History:  Past Medical History:  Diagnosis Date  . Allergy   . Asthma   . Parkinson's disease (HCC)    Congenital R fourth nerve palsy    Current medications:  Outpatient Encounter Medications as of 11/26/2017  Medication Sig  . amantadine (SYMMETREL) 100 MG capsule TAKE 1 CAPSULE DAILY.  . carbidopa-levodopa (SINEMET CR) 50-200 MG tablet Take 1 tablet by mouth at bedtime.  . carbidopa-levodopa-entacapone (STALEVO 100) 25-100-200 MG tablet 2 tablets at 6:30 am, 1 tablet at 9:00 am, 2 tablets at 11:30 am, 1 tablet at 2 pm, 1 tablet at 4:30 pm, 1 tablet at 7:00 pm  . cetirizine (ZYRTEC) 10 MG tablet Take 10 mg by mouth daily.  . Multiple Vitamins-Minerals (ZINC PO) Take by mouth.  . pramipexole (MIRAPEX) 0.5 MG tablet Take 1 tablet (0.5 mg total) by mouth 3 (three) times daily.  . rasagiline (AZILECT) 1 MG TABS tablet Take 1 tablet (1 mg total) by mouth daily.  Marland Kitchen UNABLE TO FIND Hemp 35 mg in the morning, 15 mg in the afternoon   No facility-administered encounter medications on file as of 11/26/2017.      Current Examination:  Behavioral Observations:  Appearance: Neatly, casually and appropriately dressed and groomed Gait: Ambulated with a cane Speech: Fluent; mildly reduced rate and volume. No significant word finding difficulty. Thought process: Linear, goal directed Affect:  Blunted/masked Interpersonal: Pleasant, appropriate. Test taking behavior: Patient demonstrated significant impulsivity (e.g., starting tasks before instructions had been given) Orientation: Oriented to all spheres. Accurately named the current President and his predecessor.   Tests Administered: . Test of Premorbid Functioning (TOPF) . Wechsler Adult Intelligence Scale-Fourth Edition (WAIS-IV): Similarities, Block Design, Matrix Reasoning, Arithmetic, and Digit Span subtests . Wechsler Memory Scale-Fourth Edition (WMS-IV) Older Adult Version (ages 62-90): Logical Memory I, II and Recognition subtests  . DIRECTV Verbal Learning Test - 2nd Edition (CVLT-2) Short Form . Repeatable Battery for the Assessment of Neuropsychological Status (RBANS) Form A:  Figure Copy and Recall subtests and Semantic Fluency subtest . Boston Naming Test (BNT) . Boston Diagnostic Aphasia Examination: Complex Ideational Material subtest . Controlled Oral Word Association Test (COWAT) . Trail Making Test A and B . Clock drawing test . Symbol Digit Modalities Test (SDMT) . Geriatric Depression Scale (GDS) 15 Item . Generalized Anxiety Disorder - 7 item screener (GAD-7) . Parkinson's Disease Questionnaire (PDQ-39)  Test Results: Note: Standardized scores are presented only for use by appropriately trained professionals and to allow for any future test-retest comparison. These scores should not be interpreted without consideration of all the information that is contained in the rest of the report. The most recent standardization samples from the test publisher or other sources were used whenever possible to derive standard scores; scores were corrected for age, gender, ethnicity and education when available.   Test Scores:  Test Name Raw Score Standardized Score Descriptor  TOPF 47/70 SS= 104 Average  WAIS-IV Subtests     Similarities 25/36 ss= 10 Average  Block Design 33/66 ss= 10 Average  Matrix Reasoning  15/26 ss= 11 Average  Arithmetic 20/22 ss= 15 Superior  Digit Span  24/48 ss= 9 Average  WAIS-IV Index Scores     Perceptual Reasoning  SS= 104 Average  Working Memory  SS= 111 High average  WMS-IV Subtests     LM I 25/53 ss= 7 Low average  LM II 15/39 ss= 8 Average   LM II Recognition 21/23 Cum %: >75 Above average  RBANS Subtests     Figure Copy 19/20 Z= 0.5 Average  Figure Recall 11/20 Z= -0.7 Average  Semantic Fluency 12 Z= -2 Impaired  CVLT-II Scores     Trial 1 4/9 Z= -1.5 Borderline  Trial 4 8/9 Z= 0.5 Average  Trials 1-4 total 25/36 T= 48 Average  SD Free Recall 6/9 Z= -0.5 Average  LD Free Recall 4/9 Z= -1 Low average  LD Cued Recall 5/9 Z= -0.5 Average  Recognition Discriminability 8/9 hits, 2 false positives Z= 0 Average   Forced Choice Recognition 9/9  WNL  BNT 54/60 T= 44 Average   BDAE Subtest     Complex Ideational Material 10/12    COWAT-FAS 67 T= 71 Very superior  COWAT-Animals 13 T= 38 Low average  Trail Making Test A  31" 0 errors T= 54 Average  Trail Making Test B  126" 3 errors T= 38 Low average  Clock Drawing   Mildly impaired  SDMT     Written  Z= -0.8 Low average  Oral  Z= -0.4 Average  GDS-15 7/15  Mild  GAD-7 13/21  Moderate  PDQ-39     Mobility 52.5%    Activities of Daily Living 33.3%    Emotional Well Being 12.5%    Stigma 25%    Social Support 25%    Cognitive Impairment 56.25%    Communication 33.3%    Bodily Discomfort 16.6%        Description of Test Results:  Premorbid verbal intellectual abilities were estimated to have been within the average range based on a test of word reading, but this may be an under-estimate given his educational history (PhD). Psychomotor processing speed was low average. When the motor component was removed, processing speed was average. Auditory attention and working memory ranged from average to superior. Visual-spatial construction was average. Language abilities were variable. Specifically,  confrontation naming was average, and semantic verbal fluency was low average (for animals) to impaired (for fruits/vegetables). Auditory comprehension of complex ideational material was below expectation but not significantly impaired. With regard to verbal memory, encoding and acquisition of non-contextual information (i.e., word list) was average. After a brief distracter task, free recall was average (6/9 items). After a delay, free recall was low average (4/9 items). Cued recall was average (5/9 items). Performance on a yes/no recognition task was average. On another verbal memory test, encoding and acquisition of  contextual auditory information (i.e., short stories) was low average. After a delay, free recall was low end of average. Performance on a yes/no recognition task was above average. With regard to non-verbal memory, delayed free recall of visual information was average. Performances on tests measuring various aspects of executive functioning were variable, ranging from above average to impaired. Mental flexibility and set-shifting were low average on Trails B and he committed three set loss errors. Verbal fluency with phonemic search restrictions was very superior. Verbal abstract reasoning was average. Non-verbal abstract reasoning was average. Performance on a clock drawing task was mildly impaired. On a self-report measure of mood, the patient's responses were indicative of mild depression at the present time. On a self-report measure of anxiety, the patient endorsed moderate generalized anxiety characterized by daily worrying, trouble relaxing and irritability. On a self-report measure assessing for the impact of PD symptoms on daily functioning and quality of life, he reported the most difficulty with mobility and cognitive impairment. He also reported some difficulty with ADLs, communication, stigma, and social support. He denied significant problems with emotional well being or bodily  discomfort.   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 Many performances on formal cognitive testing fell within the average range, which could represent some mild decline from premorbid abilities. He demonstrated clinically impaired performances on tests of semantic verbal fluency, and there were behavioral indications of executive dysfunction (e.g., Trails B errors, as well as significant impulsivity throughout the testing session). I am surprised testing did not reveal more impairment, given the reported difficulties with instrumental ADLs and daily functioning, but his high level of cognitive reserve may be inflating test scores somewhat. For example, the patient is clearly having visual-spatial perception problems in daily life but he performed within the average range on tests that usually capture this. Based on these test data, he does not meet diagnostic criteria for dementia. His test results do warrant a diagnosis of mild cognitive impairment, and this could certainly be related to PD. He does not appear to have an underlying psychiatric disorder, but he is endorsing symptoms of depression and generalized anxiety on self-report measures, which may be situational and related to retirement and recent stressors.  I can't explain the episodes of unusual behavior his wife described (that he has no memory for), but it appears these episodes occurred only when he was in a stressful circumstance or out of his normal routine, so hopefully these won't recur.     Recommendations/Plan: Based on the findings of the present evaluation, the following recommendations are offered:  1. Education regarding the impact of PD on cognitive functioning and how to cope with cognitive changes was provided.  2. This evaluation provides a baseline for future comparison. I am recommending the patient be re-evaluated in one year in order to monitor cognitive status,  track any progression of symptoms and further assist with treatment planning. 3. He was encouraged to continue to engage in regular physical exercise, and to continue to implement as much routine/structure in his daily life as possible. 4. The patient is having significant difficulties with many IADLs like managing medications and dressing, seemingly due to a combination of visual-spatial perception difficulties, other cognitive deficits and motor symptoms. He also is having difficulty getting in and out of bed. I think he may benefit from OT and possibly PT for these issues. If Dr. Arbutus Leas agrees, he and his wife are amenable to this recommendation and would appreciate a referral.  5. The patient's wife would like more information on the alarm pillbox we sell in our office. I printed out a picture of it for them and they will look online and then call us if they decide to purchase one.   Feedback to Patient: Zachary [name] and his wife returned for a feedback appointment on 11/26/2017 to review the results of his neuropsychological evaluation with this provider. 45 minutes face-to-face time was spent reviewing his test results, my impressions and my recommendations as detailed above.    Total time spent on this patient's case: 100 minutes for neurobehavioral status exam with psychologist (CPT code 1610996116); 180 minutes of testing/scoring by psychometrician under psychologist's supervision (CPT codes [phone], [phone] units); 180 minutes for integration of patient data, interpretation of standardized test results and clinical data, clinical decision making, treatment planning and preparation of this report, and interactive feedback with review of results to the patient/family by psychologist (CPT codes [phone] [id number], [phone] units).      Thank you for your referral of Zachary [name]. Please feel free to contact me if you have any questions or concerns regarding this report.

## 2017-11-26 ENCOUNTER — Ambulatory Visit: Payer: Medicare Other | Admitting: Psychology

## 2017-11-26 ENCOUNTER — Encounter: Payer: Self-pay | Admitting: Psychology

## 2017-11-26 DIAGNOSIS — G20 Parkinson's disease: Secondary | ICD-10-CM

## 2017-11-26 DIAGNOSIS — G3184 Mild cognitive impairment, so stated: Secondary | ICD-10-CM

## 2017-11-26 DIAGNOSIS — R41844 Frontal lobe and executive function deficit: Secondary | ICD-10-CM

## 2017-11-26 DIAGNOSIS — R413 Other amnesia: Secondary | ICD-10-CM

## 2017-11-26 HISTORY — DX: Parkinson's disease: G20

## 2017-11-27 ENCOUNTER — Telehealth: Payer: Self-pay | Admitting: Neurology

## 2017-11-27 NOTE — Telephone Encounter (Signed)
-----   Message from Koleen DistanceMarybeth Bailar-Heath, PsyD sent at 11/26/2017  3:49 PM EST ----- Regarding: OT/PT? Hi! Would this patient be appropriate for OT, and possibly PT for the issues I noted in his report? If so, they are good with order being placed. Thanks! MB

## 2017-11-27 NOTE — Telephone Encounter (Signed)
Pt has an appt in 2 weeks and I will address with him then

## 2017-12-05 ENCOUNTER — Other Ambulatory Visit: Payer: Self-pay | Admitting: Neurology

## 2017-12-05 DIAGNOSIS — G20 Parkinson's disease: Secondary | ICD-10-CM

## 2017-12-05 NOTE — Telephone Encounter (Signed)
Your last note (only time seeing him) did not mention Azilect. It looks like he was taking it from Dr. Frances FurbishAthar. Please advise.

## 2017-12-05 NOTE — Telephone Encounter (Signed)
Okay to RF.  Put refills on it.

## 2017-12-11 NOTE — Progress Notes (Signed)
Zachary [name] was seen today in the movement disorders clinic for neurologic consultation at the request of Martha Clan, MD.  The patient is accompanied by his wife who supplements the history.  The consultation is for the evaluation of PD.  Patient has seen Dr. Frances Furbish and Dr. Zola Button previously.  I have reviewed an extensive number of records available to me.  Patient has not been seeing Dr. Zola Button very long and just started seeing him in March, 2018.  His last visit was on Feb 26, 2017.  The patient believes that his first symptom was decreased left arm swing in 1998 or 1999.  He did go to urgent care in approximately the year 2000 and they told him that they thought he had Parkinson's disease.  He was formally diagnosed in September, 2000.  He was subsequently followed by Dr. Sandria Manly.  Records indicate that the patient was initially treated with selegiline and pramipexole.  Levodopa was added in 2005.  Azilect in 2008.  At some point in time, he was changed to Stalevo and amantadine was added.  He followed with Dr. Sandria Manly until he retired and then was transferred to Dr. Frances Furbish in 2014.  He transferred to Dr. Zola Button in March, 2018.  DBS has been discussed many times over the years, but the patient has not wanted to proceed.  The patient developed motor freezing in approximately 2016.  He is currently on Stalevo 100, 1 tablet at 6:30 AM/9 AM/11:30am/2 PM/4:30/7 PM. He takes carbidopa/levodopa 50/200 CR at bedtime (9:30 PM).  He is on pramipexole 0.5 mg tid.  Dr. Zola Button stopped amantadine last visit because of cognitive changes.  Pt didn't do that and is on 100 mg q day.  Instead he started taking hemp oil and he didn't want to change 2 things at once.   12/13/17 update: The patient is seen today in follow-up, accompanied by his wife who supplements the history.  The patients Stalevo was reworked last visit so that he is now on Stalevo 100, 2 tablets at 6:30 am, 1 tablet at 9:00 am, 2 tablets at 11:30 am, 1 tablet at 2 pm,  1 tablet at 4:30 pm, 1 tablet at 7:00 pm.  He is not following this and is taking stalevo 100, 2 tablets at 6:30 am, 1 tablet at 9:00 am, 2 tablets at 11:30 am, 1 tablet at 2 pm, 1 tablet at 4:30 pm, 1 tablet at 7:00 pm (this was the previous dosing schedule).  pts wife states that he felt that he didn't need it but now thinks that he does.  They are wanting to increase that now.   He remains on carbidopa/levodopa 50/200 at bedtime and pramipexole 0.5 mg 3 times per day. He is having some unpredictable off and takes some time to get back "on" once takes medication. Still on amantadine 100 mg daily.  He denies compulsive behaviors.  Denies sleep attacks.  He did have neurocognitive testing completed on October 31, 2017.  He has already followed up with Dr. Alinda Dooms.  No dementia was evident.  This demonstrated mild cognitive impairment.  Also, it was recommended that the patient consider PT/OT given trouble with managing dressing and trouble getting in and out of bed.  Still at [facility] 3-5 days per week but didn't add CV activity.  He is in the drumming program.  Few falls since last visit and he went through a dark/narrow area in the house (didn't turn on light) and fell.  He had  a fall at funeral.  He was standing and fell but people caught him.  He was seeing a few hallucinations but gone over the last few weeks.  "I will occasionally start a conversation with a grandchild that isn't there."  PREVIOUS MEDICATIONS: selegiline, rasagiline, pramipexole, carbidopa/levodopa, Stalevo, pramipexole, amantadine   ALLERGIES:   Allergies  Allergen Reactions  . Tree Extract     CURRENT MEDICATIONS:  Outpatient Encounter Medications as of 12/13/2017  Medication Sig  . amantadine (SYMMETREL) 100 MG capsule TAKE 1 CAPSULE DAILY.  . carbidopa-levodopa (SINEMET CR) 50-200 MG tablet Take 1 tablet by mouth at bedtime.  . carbidopa-levodopa-entacapone (STALEVO 100) 25-100-200 MG tablet 2 tablets at 6:30 am, 1 tablet at 9:00  am, 2 tablets at 11:30 am, 1 tablet at 2 pm, 1 tablet at 4:30 pm, 1 tablet at 7:00 pm (Patient taking differently: 1 tablets at 6:30 am, 1 tablet at 9:00 am, 1 tablets at 11:30 am, 2 tablet at 2 pm, 1 tablet at 4:30 pm, 1 tablet at 7:00 pm)  . pramipexole (MIRAPEX) 0.5 MG tablet Take 1 tablet (0.5 mg total) by mouth 3 (three) times daily.  . rasagiline (AZILECT) 1 MG TABS tablet TAKE 1 TABLET BY MOUTH DAILY.  Marland Kitchen UNABLE TO FIND Hemp 35 mg in the morning, 15 mg in the afternoon  . [DISCONTINUED] cetirizine (ZYRTEC) 10 MG tablet Take 10 mg by mouth daily.  . [DISCONTINUED] Multiple Vitamins-Minerals (ZINC PO) Take by mouth.   No facility-administered encounter medications on file as of 12/13/2017.     PAST MEDICAL HISTORY:   Past Medical History:  Diagnosis Date  . Allergy   . Asthma   . Parkinson's disease (HCC)    Congenital R fourth nerve palsy    PAST SURGICAL HISTORY:   Past Surgical History:  Procedure Laterality Date  . CATARACT EXTRACTION, BILATERAL    . COLONOSCOPY    . SHOULDER SURGERY Left    lymph nodes  . TONSILLECTOMY    . VASECTOMY      SOCIAL HISTORY:   Social History   Socioeconomic History  . Marital status: Married    Spouse name: [name]  . Number of children: 3  . Years of education: Ph.D  . Highest education level: Not on file  Social Needs  . Financial resource strain: Not on file  . Food insecurity - worry: Not on file  . Food insecurity - inability: Not on file  . Transportation needs - medical: Not on file  . Transportation needs - non-medical: Not on file  Occupational History  . Occupation: retired    Associate Professor: [employer]    Comment: professor - Nurse, children's  Tobacco Use  . Smoking status: Never Smoker  . Smokeless tobacco: Never Used  Substance and Sexual Activity  . Alcohol use: Yes    Alcohol/week: 0.0 oz    Comment: two drinks a week  . Drug use: No    Comment: hemp oil  . Sexual activity: Not on file  Other Topics Concern  . Not on file    Social History Narrative   Pt lives at home with family.   Caffeine Use: 1-2 cups daily.    FAMILY HISTORY:   Family Status  Relation Name Status  . Mother  Deceased  . Father  Deceased at age 47       lungs, kidney  . Brother  Alive  . MGM  (Not Specified)  . Child x3 Alive    ROS:  A complete 10  system review of systems was obtained and was unremarkable apart from what is mentioned above.  PHYSICAL EXAMINATION:    VITALS:   Vitals:   12/13/17 0857  BP: 110/70  Pulse: 84  SpO2: 97%    GEN:  The patient appears stated age and is in NAD. HEENT:  Normocephalic, atraumatic.  The mucous membranes are moist. The superficial temporal arteries are without ropiness or tenderness. CV:  RRR Lungs:  CTAB Neck/HEME:  There are no carotid bruits bilaterally.  Neurological examination:  Orientation: The patient is alert and oriented x3.  Cranial nerves: There is good facial symmetry. He has facial hypomimia. Extraocular muscles are intact. The visual fields are full to confrontational testing. The speech is fluent and clear.   He is hypophonic.  The patient is able to make the gutteral sounds without difficulty.  Soft palate rises symmetrically and there is no tongue deviation. Hearing is intact to conversational tone. Sensation: Sensation is intact to light and pinprick throughout (facial, trunk, extremities). Vibration is intact at the bilateral big toe. There is no extinction with double simultaneous stimulation. There is no sensory dermatomal level identified. Motor: Strength is 5/5 in the bilateral upper and lower extremities.   Shoulder shrug is equal and symmetric.  There is no pronator drift.  Movement examination: Tone: There is mild increased tone in the LUE.  Has some trouble relaxing Abnormal movements: no tremor today Coordination:  There is decremation with RAM's, initially with finger taps bilaterally but when reminded to keep them big he is able to.  He has significant  decremation with toe taps and heel taps, L more than right. Gait and Station: The patient gets OOC on 2nd attempt without use of hands.  There is start hesitation and freezing in the door.  Once in the hall, he walks very quick and festinates.     ASSESSMENT/PLAN:  1.  PD, dx 06/1999  -He is to try the schedule give last time for Stalevo as follows: 2 tablets at 6:30 am, 1 tablet at 9:00 am, 1 tablets at 11:30 am, 2 tablet at 2 pm, 1 tablet at 4:30 pm, 1 tablet at 7:00 pm.   -He will continue carbidopa/levodopa 50/200 CR at bedtime (930 PM).    -discussed inbrija.  He is having unpredictable off.  Discussed issue with him having asthma but he reports on no medication and hasn't had asthma flare up since 2000 when he was cutting down bradford pears.  -He will continue pramipexole 0.5 mg tid.  May d/c this in future as has some hallucinations.  Same with amantadine 100 mg daily.  Dr. Zola ButtonHaq tried to stop that and patient went back on it.  -The patient is currently using CBD oil.   Not recommended by [facility] because of lack of controlled trials.  Talked about smaller trials in which marijuana helped tremor.  However, there is some data that suggests that it worsens cognition and falls.  The data also suggests that CBD oil is less effective than marijuana.  However, there have been concerns given the fact that CBD oil is unregulated and each manufacturer has different amounts of ingredient and the purity of each manufacturers ingredient has been called into question.  At this time, it is not recommended for the treatment of Parkinson's disease.  Further studies do need to be completed.    -Patient is currently going to the [facility] gym and we talked about the fact that I was really proud of him for doing  so much activity, but I also want to see him add in cardiovascular activity.  -[facility]  2.  REM behavior disorder  -This is commonly associated with PD and the patient is  experiencing this.  We discussed that this can be very serious and even harmful.  We talked about medications as well as physical barriers to put in the bed (particularly soft bed rails, pillow barriers).  We talked about moving the night stand so that it is not so close to the side of the bed.  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  3.  Mild cognitive impairment  -Patient had neurocognitive testing on October 31, 2017.  There was only evidence of mild cognitive impairment, and no dementia.  4.  Follow up is anticipated in the next few months, sooner should new neurologic issues arise.  Much greater than 50% of this visit was spent in counseling and coordinating care.  Total face to face time:  25 min   Cc:  Martha Clan, MD

## 2017-12-13 ENCOUNTER — Ambulatory Visit (INDEPENDENT_AMBULATORY_CARE_PROVIDER_SITE_OTHER): Payer: Medicare Other | Admitting: Neurology

## 2017-12-13 ENCOUNTER — Encounter: Payer: Self-pay | Admitting: Neurology

## 2017-12-13 VITALS — BP 110/70 | HR 84

## 2017-12-13 DIAGNOSIS — G20 Parkinson's disease: Secondary | ICD-10-CM

## 2017-12-13 DIAGNOSIS — G3184 Mild cognitive impairment, so stated: Secondary | ICD-10-CM

## 2017-12-13 NOTE — Patient Instructions (Addendum)
1. [facility] have been [facility] and occupational therapy. They will call you directly to schedule an appointment.  Please call [phone] if you do not hear from them.   2. Change Stalevo to the following:  2 tablets at 6:30 am, 1 at 9:00 am, 1 at 11:30 am, 2 at 2:00 pm, 1 at 4:30 pm, 1 at 7:30 pm  3. We will let you know when Inbrija is available

## 2018-01-02 ENCOUNTER — Other Ambulatory Visit: Payer: Self-pay | Admitting: Neurology

## 2018-01-02 DIAGNOSIS — G20 Parkinson's disease: Secondary | ICD-10-CM

## 2018-01-08 ENCOUNTER — Ambulatory Visit: Payer: Medicare Other | Admitting: Occupational Therapy

## 2018-01-08 ENCOUNTER — Encounter: Payer: Self-pay | Admitting: Occupational Therapy

## 2018-01-08 ENCOUNTER — Telehealth: Payer: Self-pay | Admitting: Neurology

## 2018-01-08 ENCOUNTER — Ambulatory Visit: Payer: Medicare Other | Attending: Neurology | Admitting: Physical Therapy

## 2018-01-08 DIAGNOSIS — R41842 Visuospatial deficit: Secondary | ICD-10-CM | POA: Diagnosis present

## 2018-01-08 DIAGNOSIS — R293 Abnormal posture: Secondary | ICD-10-CM | POA: Diagnosis present

## 2018-01-08 DIAGNOSIS — R2681 Unsteadiness on feet: Secondary | ICD-10-CM | POA: Diagnosis present

## 2018-01-08 DIAGNOSIS — R29898 Other symptoms and signs involving the musculoskeletal system: Secondary | ICD-10-CM | POA: Diagnosis present

## 2018-01-08 DIAGNOSIS — R41844 Frontal lobe and executive function deficit: Secondary | ICD-10-CM

## 2018-01-08 DIAGNOSIS — R2689 Other abnormalities of gait and mobility: Secondary | ICD-10-CM

## 2018-01-08 DIAGNOSIS — G20 Parkinson's disease: Secondary | ICD-10-CM

## 2018-01-08 DIAGNOSIS — R278 Other lack of coordination: Secondary | ICD-10-CM

## 2018-01-08 DIAGNOSIS — R29818 Other symptoms and signs involving the nervous system: Secondary | ICD-10-CM

## 2018-01-08 NOTE — Telephone Encounter (Signed)
-----   Message from Colonel BaldKathryn B [name], OT sent at 01/08/2018 12:03 PM EDT ----- Dr. Calla Kicksat,  Zachary [name] was seen for PT/OT evals this morning. He demonstrates decreased speech volume and he is interested in speech therapy at this time. If you agree please place this referral. Thanks, Keene BreathKathryn [name], OTR/L

## 2018-01-08 NOTE — Therapy (Signed)
[facility] [address] [place], [place], [address] Phone: [phone]   Fax:  [phone]  Physical Therapy Evaluation  Patient Details  Name: Zachary [name] MRN: [id number] Date of Birth: May 06, 1951 Referring Provider: Dr. Arbutus Leas   Encounter Date: 01/08/2018  PT End of Session - 01/08/18 1739    Visit Number  1    Number of Visits  13    Date for PT Re-Evaluation  03/09/18    Authorization Type  [employer] Medicare    PT Start Time  0804    PT Stop Time  0849    PT Time Calculation (min)  45 min    Activity Tolerance  Patient tolerated treatment well    Behavior During Therapy  [facility] for tasks assessed/performed;Impulsive       Past Medical History:  Diagnosis Date  . Allergy   . Asthma   . Parkinson's disease (HCC)    Congenital R fourth nerve palsy    Past Surgical History:  Procedure Laterality Date  . CATARACT EXTRACTION, BILATERAL    . COLONOSCOPY    . SHOULDER SURGERY Left    lymph nodes  . TONSILLECTOMY    . VASECTOMY      There were no vitals filed for this visit.   Subjective Assessment - 01/08/18 0806    Subjective  Pt feels like he "doesn't want to miss out on any services."  Wife reports that at Dr. Don Perking visit-he was moving much more poorly (even had to go  in wheelchair), but he is moving better now.  He has had some changes in medications and increase in working out at gym.  Wife reports getting in and out of the bed, in and out of the car are difficult at times.  Usually using the wheelchair at night to get to the bathroom.  Wife reports that it's almost like he forgets how to do these activities.  Has episodes of freezing-unsure if related to meds, but doorways.  Has had several falls in the past 6 months-out of bed, down the steps.    Patient is accompained by:  Family member wife    Patient Stated Goals  Pt's goal for therapy is to trying to get better balance and eliminate the shuffling.    Currently in Pain?  Yes    Pain Score  3     Pain Location  Knee    Pain Orientation  Left    Pain Descriptors / Indicators  Aching    Pain Type  Acute pain    Pain Onset  1 to 4 weeks ago    Pain Frequency  Intermittent    Aggravating Factors   sleeping on it wrong aggravates    Pain Relieving Factors  Advil alleviates    Effect of Pain on Daily Activities  PT will monitor, but will not address as a goal at this time.         [facility] PT Assessment - 01/08/18 0817      Assessment   Medical Diagnosis  Parkinson's disease    Referring Provider  [name]    Onset Date/Surgical Date  12/13/17 MD visit      Precautions   Precautions  Fall Is not driving      Balance Screen   Has the patient fallen in the past 6 months  Yes    How many times?  -- multiple    Has the patient had a decrease in activity level because  of a fear of falling?   No Alters activity level accordingly    Is the patient reluctant to leave their home because of a fear of falling?   No      Home Public house manager residence    Living Arrangements  Spouse/significant other    Available Help at Discharge  Family    Type of Home  House    Home Access  Stairs to enter    Entrance Stairs-Number of Steps  4    Entrance Stairs-Rails  Right    Home Layout  Two level    Home Equipment  Transport chair;Shower seat;Cane - single point;Walker - 4 wheels      Prior Function   Level of Independence  Independent    Vocation  Retired    Leisure  Yoga classes 2x/wk and strengthening 3x/wk at [employer], [employer] class; enjoys golfing      Cognition   Overall Cognitive Status  History of cognitive impairments - at baseline Mild cognitive impairment per Dr. Alinda Dooms exam    Behaviors  Impulsive      Observation/Other Assessments   Focus on Therapeutic Outcomes (FOTO)   NA      Posture/Postural Control   Posture/Postural Control  Postural limitations    Postural Limitations  Rounded Shoulders      ROM /  Strength   AROM / PROM / Strength  Strength      Strength   Overall Strength  Within functional limits for tasks performed    Overall Strength Comments  Grossly tested at least 4+/5 bilaterally      Transfers   Transfers  Sit to Stand;Stand to Sit    Sit to Stand  6: Modified independent (Device/Increase time);Without upper extremity assist;From chair/3-in-1    Five time sit to stand comments   8.88    Stand to Sit  6: Modified independent (Device/Increase time);Without upper extremity assist;To chair/3-in-1      Ambulation/Gait   Ambulation/Gait  Yes    Ambulation/Gait Assistance  5: Supervision    Ambulation Distance (Feet)  150 Feet    Assistive device  None    Gait Pattern  Step-through pattern;Narrow base of support Some dyskinesias, quick movements    Ambulation Surface  Level;Indoor    Gait velocity  7.25 sec = 4.52 ft/sec    Gait Comments  Important to note that patient is in a period of "on time" during PT eval today, so he is moving well.  He does have "off-times", where he experiences freezing episodes and difficulty with movement-none noted today.      Standardized Balance Assessment   Standardized Balance Assessment  Timed Up and Go Test      Timed Up and Go Test   Normal TUG (seconds)  9.07 LOB following turn    Manual TUG (seconds)  9.69    Cognitive TUG (seconds)  8.69    TUG Comments  Scores >13.5 sec indicates increased fall risk; pt needs cues to fully listen to directions prior to completing task.      High Level Balance   High Level Balance Comments  MiniBESTest score:  25/28 (important to note patient is in "on-time" with medications during PT eval)       Mini-BESTest: Balance Evaluation Systems Test  2005-2013 [facility]. All rights reserved. ________________________________________________________________________________________Anticipatory_________Subscore___6__/6 1. SIT TO STAND Instruction: "Cross your arms across your chest.  Try not to use your hands unless you  must.Do not let your legs lean against the back of the chair when you stand. Please stand up now." X(2) Normal: Comes to stand without use of hands and stabilizes independently. (1) Moderate: Comes to stand WITH use of hands on first attempt. (0) Severe: Unable to stand up from chair without assistance, OR needs several attempts with use of hands. 2. RISE TO TOES Instruction: "Place your feet shoulder width apart. Place your hands on your hips. Try to rise as high as you can onto your toes. I will count out loud to 3 seconds. Try to hold this pose for at least 3 seconds. Look straight ahead. Rise now." X(2) Normal: Stable for 3 s with maximum height. (1) Moderate: Heels up, but not full range (smaller than when holding hands), OR noticeable instability for 3 s. (0) Severe: < 3 s. 3. STAND ON ONE LEG Instruction: "Look straight ahead. Keep your hands on your hips. Lift your leg off of the ground behind you without touching or resting your raised leg upon your other standing leg. Stay standing on one leg as long as you can. Look straight ahead. Lift now." Left: Time in Seconds Trial 1:__17___Trial 2:__20___ Baron Hamper) Normal: 20 s. (1) Moderate: < 20 s. (0) Severe: Unable. Right: Time in Seconds Trial 1:_20____Trial 2:__18.43___ X(2) Normal: 20 s. (1) Moderate: < 20 s. (0) Severe: Unable To score each side separately use the trial with the longest time. To calculate the sub-score and total score use the side [left or right] with the lowest numerical score [i.e. the worse side]. ______________________________________________________________________________________Reactive Postural Control___________Subscore:__5___/6 4. COMPENSATORY STEPPING CORRECTION- FORWARD Instruction: "Stand with your feet shoulder width apart, arms at your sides. Lean forward against my hands beyond your forward limits. When I let go, do whatever is necessary, including taking a step, to  avoid a fall." X(2) Normal: Recovers independently with a single, large step (second realignment step is allowed). (1) Moderate: More than one step used to recover equilibrium. (0) Severe: No step, OR would fall if not caught, OR falls spontaneously. 5. COMPENSATORY STEPPING CORRECTION- BACKWARD Instruction: "Stand with your feet shoulder width apart, arms at your sides. Lean backward against my hands beyond your backward limits. When I let go, do whatever is necessary, including taking a step, to avoid a fall." (2) Normal: Recovers independently with a single, large step. X(1) Moderate: More than one step used to recover equilibrium. (0) Severe: No step, OR would fall if not caught, OR falls spontaneously. 6. COMPENSATORY STEPPING CORRECTION- LATERAL Instruction: "Stand with your feet together, arms down at your sides. Lean into my hand beyond your sideways limit. When I let go, do whatever is necessary, including taking a step, to avoid a fall." Left X(2) Normal: Recovers independently with 1 step (crossover or lateral OK). (1) Moderate: Several steps to recover equilibrium. (0) Severe: Falls, or cannot step. Right X(2) Normal: Recovers independently with 1 step (crossover or lateral OK). (1) Moderate: Several steps to recover equilibrium. (0) Severe: Falls, or cannot step. Use the side with the lowest score to calculate sub-score and total score. ____________________________________________________________________________________Sensory Orientation_____________Subscore:__6_______/6 7. STANCE (FEET TOGETHER); EYES OPEN, FIRM SURFACE Instruction: "Place your hands on your hips. Place your feet together until almost touching. Look straight ahead. Be as stable and still as possible, until I say stop." Time in seconds:________ X(2) Normal: 30 s. (1) Moderate: < 30 s. (0) Severe: Unable. 8. STANCE (FEET TOGETHER); EYES CLOSED, FOAM SURFACE Instruction: "Step onto the foam. Place your  hands on your  hips. Place your feet together until almost touching. Be as stable and still as possible, until I say stop. I will start timing when you close your eyes." Time in seconds:________ X(2) Normal: 30 s. (1) Moderate: < 30 s. (0) Severe: Unable. 9. INCLINE- EYES CLOSED Instruction: "Step onto the incline ramp. Please stand on the incline ramp with your toes toward the top. Place your feet shoulder width apart and have your arms down at your sides. I will start timing when you close your eyes." Time in seconds:________ X(2) Normal: Stands independently 30 s and aligns with gravity. (1) Moderate: Stands independently <30 s OR aligns with surface. (0) Severe: Unable. _________________________________________________________________________________________Dynamic Gait ______Subscore_____9___/10 10. CHANGE IN GAIT SPEED Instruction: "Begin walking at your normal speed, when I tell you 'fast', walk as fast as you can. When I say 'slow', walk very slowly." X(2) Normal: Significantly changes walking speed without imbalance. (1) Moderate: Unable to change walking speed or signs of imbalance. (0) Severe: Unable to achieve significant change in walking speed AND signs of imbalance. 11. WALK WITH HEAD TURNS - HORIZONTAL Instruction: "Begin walking at your normal speed, when I say "right", turn your head and look to the right. When I say "left" turn your head and look to the left. Try to keep yourself walking in a straight line." X(2) Normal: performs head turns with no change in gait speed and good balance. (1) Moderate: performs head turns with reduction in gait speed. (0) Severe: performs head turns with imbalance. 12. WALK WITH PIVOT TURNS Instruction: "Begin walking at your normal speed. When I tell you to 'turn and stop', turn as quickly as you can, face the opposite direction, and stop. After the turn, your feet should be close together." X(2) Normal: Turns with feet close FAST (< 3  steps) with good balance. (1) Moderate: Turns with feet close SLOW (>4 steps) with good balance. (0) Severe: Cannot turn with feet close at any speed without imbalance. 13. STEP OVER OBSTACLES Instruction: "Begin walking at your normal speed. When you get to the box, step over it, not around it and keep walking." (2) Normal: Able to step over box with minimal change of gait speed and with good balance. X(1) Moderate: Steps over box but touches box OR displays cautious behavior by slowing gait. (0) Severe: Unable to step over box OR steps around box. 14. TIMED UP & GO WITH DUAL TASK [3 METER WALK] Instruction TUG: "When I say 'Go', stand up from chair, walk at your normal speed across the tape on the floor, turn around, and come back to sit in the chair." Instruction TUG with Dual Task: "Count backwards by threes starting at ___. When I say 'Go', stand up from chair, walk at your normal speed across the tape on the floor, turn around, and come back to sit in the chair. Continue counting backwards the entire time." TUG: ________seconds; Dual Task TUG: ________seconds X(2) Normal: No noticeable change in sitting, standing or walking while backward counting when compared to TUG without Dual Task. (1) Moderate: Dual Task affects either counting OR walking (>10%) when compared to the TUG without Dual Task. (0) Severe: Stops counting while walking OR stops walking while counting. When scoring item 14, if subject's gait speed slows more than 10% between the TUG without and with a Dual Task the score should be decreased by a point. TOTAL SCORE: _____26___/28        Objective measurements completed on examination: See above findings.  PT Education - 01/08/18 0955    Education provided  Yes    Education Details  Results of objective measures; importance of seeing patient perform similiar measures during "off-times"; recommended pt/wife discuss compulsive type behaviors  (described by wife) with Dr. Arbutus Leasat, given patient is on Mirapex    Person(s) Educated  Patient;Spouse    Methods  Explanation    Comprehension  Verbalized understanding       PT Short Term Goals - 01/08/18 1750      PT SHORT TERM GOAL #1   Title  Pt will perform HEP with family's supervision, to address pt's Parkinson's related deficits.  TARGET 02/07/18    Time  4    Period  Weeks    Status  New    Target Date  02/07/18      PT SHORT TERM GOAL #2   Title  Pt will perform 5x sit<>stand without posterior lean/pull for improved transfer efficiency and safety.    Time  4    Period  Weeks    Status  New    Target Date  02/07/18      PT SHORT TERM GOAL #3   Title  Pt/wife will verbalize understanding of tips to reduce freezing episodes with gait and turns.    Time  4    Period  Weeks    Status  New    Target Date  02/07/18      PT SHORT TERM GOAL #4   Title  Pt will perform TUG and TUG cognitive without LOB during turns, 2 of 3 trials.    Time  4    Period  Weeks    Status  New    Target Date  02/07/18        PT Long Term Goals - 01/08/18 1757      PT LONG TERM GOAL #1   Title  Pt/wife will verbalize understanding of fall prevention in home environment.  TARGET4/26/19    Time  6    Period  Weeks    Status  New    Target Date  02/21/17      PT LONG TERM GOAL #2   Title  Pt will perform at least 8 of 10 reps of sit<>stand transfers from lower than 18" surfaces, no posterior lean or LOB.    Time  6    Period  Weeks    Status  New    Target Date  02/21/18      PT LONG TERM GOAL #3   Title  Pt will report at least 50% improvement in bed mobility.    Time  6    Period  Weeks    Status  New    Target Date  02/21/18      PT LONG TERM GOAL #4   Title  Pt/wife will report at least 50% improvement in transport wheelchair negotiation for night-time functional mobility.    Time  6    Period  Weeks    Status  New    Target Date  02/21/18      PT LONG TERM GOAL #5    Title  Pt/wife will verbalize strategies and cueing methods to assist in "off-time" in medication cycle for improved mobility.    Time  6    Period  Weeks    Status  New    Target Date  02/21/18             Plan - 01/08/18 1740  Clinical Impression Statement  Pt is a 67 year old male with history of Parkinson's disease since 2000, who presents to OP PT today with reported functional mobility difficulties with bed mobility, transfers, freezing of gait.  Pt has history of falls (no number given in past 6 months);  he reports significant off-times, where wife reports he can "stand at a doorway for 5 minutes".  He is in period of "on-time" with medications today at eval, but is noted to demonstrate decreased timing and coordination of gait, loss of balance with turns, decreased functional strength and coordination (posterior pull with sit to stand).  Pt's mobility fluctuations are such that he has to use a transport chair in home at night.  PT requested patient come to therapy in "off-medication" cycle so functional measures may be repeated.  Pt will benefit from skilled PT to address functional mobility, transfer, balance and gait issues, with education to pt/family in strategies in how to manage on-off times with medications in regards to fluctuations with mobility.    History and Personal Factors relevant to plan of care:  Hx of PD since 2000; reports multiple falls; reports hx of hallucinations    Clinical Presentation  Evolving    Clinical Presentation due to:  mild cognitive impairment; reports hallucinations and wife reports "maninc" or compulsive episodes; recent PD medication changes    Clinical Decision Making  Moderate    Rehab Potential  Good    PT Frequency  2x / week    PT Duration  6 weeks plus eval    PT Treatment/Interventions  ADLs/Self Care Home Management;DME Instruction;Gait training;Stair training;Functional mobility training;Therapeutic activities;Therapeutic  exercise;Balance training;Patient/family education;Neuromuscular re-education    PT Next Visit Plan  If patient in "off-time" of medication cycle-will need to repeat funcitonal measures; tips to reduce freezing with gait and turns, bed mobility, transfers low surfaces    Consulted and Agree with Plan of Care  Patient;Family member/caregiver       Patient will benefit from skilled therapeutic intervention in order to improve the following deficits and impairments:  Abnormal gait, Decreased balance, Decreased safety awareness, Decreased mobility, Difficulty walking, Impaired tone, Postural dysfunction  Visit Diagnosis: Other abnormalities of gait and mobility  Unsteadiness on feet  Abnormal posture  Other symptoms and signs involving the nervous system     Problem List Patient Active Problem List   Diagnosis Date Noted  . Parkinson's disease (HCC) 05/04/2013    Gean Maidens. 01/08/2018, 6:02 PM  [facility] [address] [place], [place], [address] Phone: [phone]   Fax:  [phone]  Name: Zachary [name] MRN: [id number] Date of Birth: Jun 28, 1951

## 2018-01-08 NOTE — Therapy (Signed)
[facility] [address] [place], [place], [address] Phone: [phone]   Fax:  [phone]  Occupational Therapy Evaluation  Patient Details  Name: Zachary [name] MRN: [id number] Date of Birth: [birth year] Referring Provider: Dr. Arbutus Leas   Encounter Date: 01/08/2018  OT End of Session - 01/08/18 0955    Visit Number  1    Number of Visits  17    Date for OT Re-Evaluation  03/09/18    Authorization Type  [employer] Medicare    OT Start Time  0850    OT Stop Time  0930    OT Time Calculation (min)  40 min    Activity Tolerance  Patient tolerated treatment well    Behavior During Therapy  [facility] for tasks assessed/performed       Past Medical History:  Diagnosis Date  . Allergy   . Asthma   . Parkinson's disease (HCC)    Congenital R fourth nerve palsy    Past Surgical History:  Procedure Laterality Date  . CATARACT EXTRACTION, BILATERAL    . COLONOSCOPY    . SHOULDER SURGERY Left    lymph nodes  . TONSILLECTOMY    . VASECTOMY      There were no vitals filed for this visit.  Subjective Assessment - 01/08/18 0851    Subjective   Pt with PD diagnosed in 2000    Pertinent History  PD diagnosed 2000, asthma    Patient Stated Goals  improve perfromance of daily activities    Currently in Pain?  Yes    Pain Score  2     Pain Location  Knee    Pain Orientation  Left    Pain Descriptors / Indicators  Aching    Pain Type  Acute pain    Pain Onset  1 to 4 weeks ago    Pain Frequency  Intermittent    Aggravating Factors   malpositioning    Pain Relieving Factors  advil    Multiple Pain Sites  No        [facility] OT Assessment - 01/08/18 0854      Assessment   Medical Diagnosis  Parkinson's disease    Referring Provider  Dr. Arbutus Leas    Onset Date/Surgical Date  12/13/17    Hand Dominance  Right    Prior Therapy  no      Precautions   Precautions  Fall    Precaution Comments  is not driving      Balance Screen   Has  the patient fallen in the past 6 months  Yes    How many times?  4    Has the patient had a decrease in activity level because of a fear of falling?   No    Is the patient reluctant to leave their home because of a fear of falling?   No      Home  Environment   Family/patient expects to be discharged to:  Private residence    Home Access  Stairs    Home Layout  Two level    Lives With  Spouse      Prior Function   Level of Independence  Independent with gait;Needs assistance with ADLs needs occasional assist with ADLS    Vocation  Retired    Leisure  Yoga classes 2x/wk and strengthening 3x/wk at [employer], [employer] Moves Ex class; enjoys golfing      ADL   Eating/Feeding  Modified independent increased spills  Grooming  Modified independent    Upper Body Bathing  Modified independent    Lower Body Bathing  Modified independent increased time    Upper Body Dressing  Needs assist for fasteners;Minimal assistance    Lower Body Dressing  Modified independent    Toilet Transfer  Modified independent    Tub/Shower Transfer  Modified independent    ADL comments  increased time for all ADLS      IADL   Shopping  Assistance for transportation    Light Housekeeping  -- Performs limited activities    Meal Prep  Needs to have meals prepared and served    Medication Management  Has difficulty remembering to take medication Pt's wife assists    Financial Management  Dependent Pt's wife handles      Mobility   Mobility Status  History of falls;Freezing      Written Expression   Dominant Hand  Right    Handwriting  75% legible during on time      Vision - History   Additional Comments  Pt reports difficulty with depth perception      Vision Assessment   Vision Assessment  Vision impaired  _ to be further tested in functional context    Depth Perception  reports impairment possible hallucinations      Cognition   Overall Cognitive Status  Impaired/Different from baseline    Memory  Impaired     Memory Impairment  Decreased short term memory    Behaviors  Impulsive    Cognition Comments  Pt was impulsive throughout eval, will further assess cognition in functional context      Observation/Other Assessments   Focus on Therapeutic Outcomes (FOTO)   NA    Other Surveys   Select    Physical Performance Test    Yes    Simulated Eating Time (seconds)  17.78 secs, bradykinesia noted    Donning Doffing Jacket Comments  3 button/ unbutton: 2 mins 27 secs      Posture/Postural Control   Posture/Postural Control  Postural limitations    Postural Limitations  Rounded Shoulders      Coordination   Gross Motor Movements are Fluid and Coordinated  No    Fine Motor Movements are Fluid and Coordinated  No    9 Hole Peg Test  Right;Left    Right 9 Hole Peg Test  35.94 secs,     Left 9 Hole Peg Test  38.06    Box and Blocks  RUE 57 blocks, LUE 65 blocks      Tone   Assessment Location  Left Upper Extremity      ROM / Strength   AROM / PROM / Strength  AROM      AROM   Overall AROM   Deficits    Overall AROM Comments  RUE shoulder flexion 130, elbow ext-10, LUE shoulder flexion 120, elbow ext -15      LUE Tone   LUE Tone  Mild rigidity                        OT Short Term Goals - 01/08/18 1002      OT SHORT TERM GOAL #1   Title  I with PD specific HEP-02/07/18    Time  4    Period  Weeks    Status  New    Target Date  02/07/18      OT SHORT TERM GOAL #2   Title  Pt will verbalize understanding of adapted strategies for ADLS/IADLS in order to maximize safety and independence.    Time  4    Period  Weeks    Status  New      OT SHORT TERM GOAL #3   Title  Pt will demonstrate improved independence with dressing as evidenced by decreasing 3 button/ unbutton time to 2 mins or less.    Baseline  2 mins 27 secs    Time  4    Period  Weeks    Status  New      OT SHORT TERM GOAL #4   Title  assess PPT#4 and set goal PRN.    Time  4    Period  Weeks     Status  New      OT SHORT TERM GOAL #5   Title  Pt will demonstrate improved LUE functional reach as evidenced by retrieving a lightweight object at 125 shoulder flexion with -10 elbow ext.    Time  4    Period  Weeks    Status  New      Additional Short Term Goals   Additional Short Term Goals  Yes      OT SHORT TERM GOAL #6   Title  Pt will demonstrate ability to write a short paragraph with 100% legibility and minimal decrease in letter size.    Baseline  75% legible    Time  4    Period  Weeks    Status  New        OT Long Term Goals - 01/08/18 1003      OT LONG TERM GOAL #1   Title  Pt will verbalize understanding of community resources and ways to prevent future PD related complications.    Time  8    Period  Weeks    Status  New    Target Date  03/09/18      OT LONG TERM GOAL #2   Title  Pt will verbalize understanding of compensatory strategies for short term memory deficits and ways to keep thinking skills sharp.    Time  8    Period  Weeks    Status  New      OT LONG TERM GOAL #3   Title  Pt will demonstrate improved ease with feeding as evidenced by decreasing PPT#2 simulated feeding to 15 secs or less    Baseline  17.78 secs    Time  8    Period  Weeks    Status  New      OT LONG TERM GOAL #4   Title  Pt will demonstrate improved fine motor coordination for ADLS as evidenced by decreasing LUE 9 hole peg test score by 3 secs.    Baseline  RUE 35.94 secs, LUE 38.06 secs    Time  8    Period  Weeks    Status  New            Plan - 01/08/18 1019    Clinical Impression Statement  Pt is a 67 y.o male diagnosed with Parkinson's disease in 2000. Pt has not previously participated in occupational therapy. Pt presents with the following deficits which impede performance of ADLS/IADLS: decreased coordination, bradykinesia, freezing episodes, decreased balance, cognitive deficits, rigidity, visual perceptual deficits. Pt can benefit from skilled occupational  therapy to maximize pt's safety and independence with daily activities and to maintain quality of life.    Occupational Profile and client  history currently impacting functional performance  PMH: PD diagnosed in 2000, asthma. 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 Pt participates in [employer] moves    Occupational performance deficits (Please refer to evaluation for details):  IADL's;ADL's;Rest and Sleep;Leisure;Social Participation    Rehab Potential  Good    Current Impairments/barriers affecting progress:  cognitive deficits, visual perceptual deficits, length of time since inital onset    OT Frequency  2x / week plus eval    OT Duration  8 weeks    OT Treatment/Interventions  Self-care/ADL training;Therapeutic exercise;Visual/perceptual remediation/compensation;Patient/family education;Gait Training;Neuromuscular education;Moist Heat;Fluidtherapy;Energy conservation;Building services engineerunctional Mobility Training;Therapeutic activities;Cognitive remediation/compensation;Passive range of motion;Manual Therapy;DME and/or AE instruction;Ultrasound;Cryotherapy    Plan  initiate coordination HEP, PWR! hands, ADL strategies    Clinical Decision Making  Limited treatment options, no task modification necessary    Consulted and Agree with Plan of Care  Family member/caregiver;Patient    Family Member Consulted  wife       Patient will benefit from skilled therapeutic intervention in order to improve the following deficits and impairments:  Abnormal gait, Decreased knowledge of use of DME, Impaired flexibility, Impaired vision/preception, Pain, Decreased mobility, Decreased coordination, Decreased activity tolerance, Decreased endurance, Decreased range of motion, Decreased strength, Impaired tone, Impaired UE functional use, Impaired perceived functional ability, Difficulty walking, Decreased safety awareness, Decreased knowledge of precautions, Decreased balance  Visit Diagnosis: Other lack of  coordination - Plan: Ot plan of care cert/re-cert  Other symptoms and signs involving the musculoskeletal system - Plan: Ot plan of care cert/re-cert  Other abnormalities of gait and mobility - Plan: Ot plan of care cert/re-cert  Frontal lobe and executive function deficit - Plan: Ot plan of care cert/re-cert  Visuospatial deficit - Plan: Ot plan of care cert/re-cert    Problem List Patient Active Problem List   Diagnosis Date Noted  . Parkinson's disease (HCC) 05/04/2013    Zachary [name] 01/08/2018, 10:35 AM Keene BreathKathryn Quintin Hjort, OTR/L [phone] Phone: [phone] 10:36 AM 01/08/18 [facility] [address] [place], [place], [address] Phone: [phone]   Fax:  [phone]  Zachary [name] MRN: [id number] Date of Birth: 11-Nov-1950

## 2018-01-15 ENCOUNTER — Telehealth: Payer: Self-pay | Admitting: Neurology

## 2018-01-15 NOTE — Telephone Encounter (Signed)
Spoke with Lurena Joinerebecca [name], regarding conversation with patient last week regarding behaviors that he wanted to make therapist aware of.  PT questions if behaviors at home are in relation to Mirapex; therefore, discussed with Dr. Arbutus Leasat.  In addition, wife describes (at PT eval) episodes of "manic" behavior from patient where he rearranges items within the home, but then does not recall doing this.  Lonia Bloodmy Marriott, PT 01/15/18 12:37 PM Phone: [phone] Fax: [phone]

## 2018-01-15 NOTE — Telephone Encounter (Signed)
Talked with Zachary BloodAmy [name].  Pt with some behaviors that may warrant change in meds.  Not sure if med related but we should go ahead and decrease Mirapex to 0.25 mg tid.  Will likely go down from there.  Jade, please let pt know

## 2018-01-15 NOTE — Telephone Encounter (Signed)
Left message on machine for patient to call back.

## 2018-01-16 ENCOUNTER — Telehealth: Payer: Self-pay | Admitting: Psychology

## 2018-01-16 NOTE — Telephone Encounter (Signed)
Important to note for med record that pt apparently cross dressing for years (unsure how long so unable to tell if possibly related to med, but doubtful).  Wife just found out and in need of counseling svs.

## 2018-01-16 NOTE — Telephone Encounter (Signed)
Spoke with patient.   Gave him instructions to decrease Pramipexole.   Also made him aware [employer] approved and forms have been sent to their company. His insurance approved [employer] valid through 10/28/18. He is aware to expect a call from them.

## 2018-01-16 NOTE — Telephone Encounter (Signed)
Telephone call with patient today to talk a little bit about resources.  Patient had earlier disclosed with another healthcare professional that he was wearing women's clothes on occasion and his wife had recently found out. He gave permission for me to reach out with resources.  Patient reports that this is something he has been doing on a casual basis for years.  He feels this may be heightened with impulsivity, but is not sure. He reports that he has not had any incidents in the last 2 weeks and is trying to disengage from this behavior.  I ensured that there is not any judgement for this behavior, but a desire to make sure that both he and his wife have resources as trust and intimacy can be impacted when things are uncovered or identified that were unknown. The patient is aware that we will keep this information confidential. He does feel that it is ok for Dr. Arbutus Leasat and or/ I to check in either before or at his next appointment. It is ok per the patient to mention this in front of his wife as this is something that they are working on together. I shared that we understand that this information is sensitive, complex and important to address when appropriate to make sure that everyone in well supported and to ensure that medications are not contributing to impulsivity.            I provided him with the resource for [facility] as they work with individual and marital counseling. They address topics of both emotional and physical intimacy and sexuality.

## 2018-01-22 ENCOUNTER — Ambulatory Visit: Payer: Medicare Other | Admitting: Occupational Therapy

## 2018-01-22 ENCOUNTER — Ambulatory Visit: Payer: Medicare Other | Admitting: Physical Therapy

## 2018-01-22 ENCOUNTER — Encounter: Payer: Self-pay | Admitting: Physical Therapy

## 2018-01-22 DIAGNOSIS — R29898 Other symptoms and signs involving the musculoskeletal system: Secondary | ICD-10-CM

## 2018-01-22 DIAGNOSIS — R29818 Other symptoms and signs involving the nervous system: Secondary | ICD-10-CM

## 2018-01-22 DIAGNOSIS — R2681 Unsteadiness on feet: Secondary | ICD-10-CM

## 2018-01-22 DIAGNOSIS — R2689 Other abnormalities of gait and mobility: Secondary | ICD-10-CM | POA: Diagnosis not present

## 2018-01-22 DIAGNOSIS — R278 Other lack of coordination: Secondary | ICD-10-CM

## 2018-01-22 NOTE — Patient Instructions (Signed)
Coordination Exercises  Perform the following exercises for 20 minutes 1 times per day. Perform with both hand(s). Perform using big movements.   Flipping Cards: Place deck of cards on the table. Flip cards over by opening your hand big to grasp and then turn your palm up big.  Deal cards: Hold 1/2 or whole deck in your hand. Use thumb to push card off top of deck with one big push.  Rotate ball with fingertips: Pick up with fingers/thumb and move as much as you can with each turn/movement (clockwise and counter-clockwise).  Pick up coins and stack one at a time: Pick up with big, intentional movements. Do not drag coin to the edge. (5-10 in a stack)  Pick up 5-10 coins one at a time and hold in palm. Then, move coins from palm to fingertips one at time and place in coin bank/container.  Practice writing: Slow down, write big, and focus on forming each letter.

## 2018-01-22 NOTE — Therapy (Signed)
[facility] [address] [place], [place], [address] Phone: [phone]   Fax:  [phone]  Physical Therapy Treatment  Patient Details  Name: Zachary [name] MRN: [id number] Date of Birth: Jun 09, 1951 Referring Provider: Dr. Arbutus Leasat   Encounter Date: 01/22/2018  PT End of Session - 01/22/18 1157    Visit Number  2    Number of Visits  13    Date for PT Re-Evaluation  03/09/18    Authorization Type  [employer] Medicare    PT Start Time  0849    PT Stop Time  0930    PT Time Calculation (min)  41 min    Activity Tolerance  Patient tolerated treatment well    Behavior During Therapy  [facility] for tasks assessed/performed;Impulsive       Past Medical History:  Diagnosis Date  . Allergy   . Asthma   . Parkinson's disease (HCC)    Congenital R fourth nerve palsy    Past Surgical History:  Procedure Laterality Date  . CATARACT EXTRACTION, BILATERAL    . COLONOSCOPY    . SHOULDER SURGERY Left    lymph nodes  . TONSILLECTOMY    . VASECTOMY      There were no vitals filed for this visit.  Subjective Assessment - 01/22/18 0850    Subjective  In contact with Dr. Arbutus Leasat regarding medications and so far I haven't made any changes.  At beginning of session, pt reports he is "on" with medications today.    Patient is accompained by:  Family member wife    Patient Stated Goals  Pt's goal for therapy is to trying to get better balance and eliminate the shuffling.    Currently in Pain?  No/denies    Pain Onset  --                No data recorded       [facility] Adult PT Treatment/Exercise - 01/22/18 0001      Transfers   Transfers  Sit to Stand;Stand to Sit    Sit to Stand  6: Modified independent (Device/Increase time);Without upper extremity assist;From chair/3-in-1    Stand to Sit  6: Modified independent (Device/Increase time);Without upper extremity assist;To chair/3-in-1    Number of Reps  10 reps;Other sets  (comment) 5 sets, from varied height progressively lower surfaces    Transfer Cueing  Cues for foot placement, forward lean and upright posture to stand.  Practiced from 20", 18", 16", and 14" soft surfaces, then from 20" standing on blue foam (50 reps sit<>stand total), with several episodes of minor posterior lean.      Neuro Re-ed    Neuro Re-ed Details   Provided handout for tips to reduce freezing episodes with gait and turns.  Practiced standing weightshifting, STOP and RESET, marching (pt reports "butt" kicks are easier for marching), use of visual target to look ahead and walk through doorways.  Pt also demonstrates strategies he uses-reaching up tall in doorframe as a means of stretching, and sometimes stepping posterior first, then forward step and go to initiate walking.       Bed Mobility: -Practiced sit<>supine:  Pt first shows how he gets into bed, bringing feet over and then lying down directly into supine; then getting out of bed, sit up into long sit from supine then bringing legs over (looks difficult and taxing, with bradykinesia noted during sit<>supine)  -Additional 3-4 reps of practice with sit>sidelying R>supine, with cues for  rocking momentum for ease of bed mobility.  Practiced supine>sidelying>sit EOB with cues for rocking, momentum and improved coordination for ease of bed mobility  -Utilized seated PWR! Rock to initiate scooting, with manual cues given at hips to assist with forward scoot, then in supine and sidelying utilized PWR! Twist with manual facilitation for ease of initiating bed mobility.       PT Education - 01/22/18 1155    Education provided  Yes    Education Details  Bed mobility and transfer technique (verbal cues and practice); tips to reduce freezing episodes with gait (handout and practice)    Person(s) Educated  Patient    Methods  Explanation;Demonstration;Handout;Verbal cues    Comprehension  Verbalized understanding;Returned  demonstration;Verbal cues required       PT Short Term Goals - 01/08/18 1750      PT SHORT TERM GOAL #1   Title  Pt will perform HEP with family's supervision, to address pt's Parkinson's related deficits.  TARGET 02/07/18    Time  4    Period  Weeks    Status  New    Target Date  02/07/18      PT SHORT TERM GOAL #2   Title  Pt will perform 5x sit<>stand without posterior lean/pull for improved transfer efficiency and safety.    Time  4    Period  Weeks    Status  New    Target Date  02/07/18      PT SHORT TERM GOAL #3   Title  Pt/wife will verbalize understanding of tips to reduce freezing episodes with gait and turns.    Time  4    Period  Weeks    Status  New    Target Date  02/07/18      PT SHORT TERM GOAL #4   Title  Pt will perform TUG and TUG cognitive without LOB during turns, 2 of 3 trials.    Time  4    Period  Weeks    Status  New    Target Date  02/07/18        PT Long Term Goals - 01/08/18 1757      PT LONG TERM GOAL #1   Title  Pt/wife will verbalize understanding of fall prevention in home environment.  TARGET4/26/19    Time  6    Period  Weeks    Status  New    Target Date  02/21/17      PT LONG TERM GOAL #2   Title  Pt will perform at least 8 of 10 reps of sit<>stand transfers from lower than 18" surfaces, no posterior lean or LOB.    Time  6    Period  Weeks    Status  New    Target Date  02/21/18      PT LONG TERM GOAL #3   Title  Pt will report at least 50% improvement in bed mobility.    Time  6    Period  Weeks    Status  New    Target Date  02/21/18      PT LONG TERM GOAL #4   Title  Pt/wife will report at least 50% improvement in transport wheelchair negotiation for night-time functional mobility.    Time  6    Period  Weeks    Status  New    Target Date  02/21/18      PT LONG TERM GOAL #5   Title  Pt/wife  will verbalize strategies and cueing methods to assist in "off-time" in medication cycle for improved mobility.    Time   6    Period  Weeks    Status  New    Target Date  02/21/18            Plan - 01/22/18 1157    Clinical Impression Statement  No additional functional measures repeated today, as pt reports he is in an "on" time with medications at beginning of session.  Pt took medication at 9:00 am, and by 9:20 (while focusing on bed mobility), pt noted to have increased freezing/festinating episodes with initiating weightshifting for scooting and bed mobility.  Focused on transfer and bed mobility training and tips to reduce freezing episodes with gait.    Rehab Potential  Good    PT Frequency  2x / week    PT Duration  6 weeks plus eval    PT Treatment/Interventions  ADLs/Self Care Home Management;DME Instruction;Gait training;Stair training;Functional mobility training;Therapeutic activities;Therapeutic exercise;Balance training;Patient/family education;Neuromuscular re-education    PT Next Visit Plan  If patient in "off-time" of medication cycle-will need to repeat funcitonal measures; review tips to reduce freezing with gait and turns, bed mobility, transfers low surfaces    Consulted and Agree with Plan of Care  Patient;Family member/caregiver       Patient will benefit from skilled therapeutic intervention in order to improve the following deficits and impairments:  Abnormal gait, Decreased balance, Decreased safety awareness, Decreased mobility, Difficulty walking, Impaired tone, Postural dysfunction  Visit Diagnosis: Unsteadiness on feet  Other symptoms and signs involving the nervous system     Problem List Patient Active Problem List   Diagnosis Date Noted  . Parkinson's disease (HCC) 05/04/2013    Zachary [name]. 01/22/2018, 12:00 PM  Gean Maidens., PT   [facility] [address] [place], [place], [address] Phone: [phone]   Fax:  [phone]  Name: Zachary [name] MRN: [id number] Date of Birth:  09-Jan-1951

## 2018-01-22 NOTE — Patient Instructions (Signed)
Tips to reduce freezing episodes with standing or walking:  1. Stand tall with your feet wide, so that you can rock and weight shift through your hips. 2. Don't try to fight the freeze: if you begin taking slower, faster, smaller steps, STOP, get your posture tall, and RESET your posture and balance.  Take a deep breath before taking the BIG step to start again. 3. March in place, with high knee stepping, to get started walking again. 4. Use auditory cues:  Count out loud, think of a familiar tune or song or cadence, use pocket metronome, to use rhythm to get started walking again. 5. Use visual cues:  Use a line to step over, use laser pointer line to step over, (using BIG steps) to start walking again. 6. Use visual targets to keep your posture tall (look ahead and focus on an object or target at eye level). 7. As you approach where your destination with walking, count your steps out loud and/or focus on your target with your eyes until you are fully there. 8. Use appropriate assistive device, as advised by your physical therapist to assist with taking longer, consistent steps. 9.     

## 2018-01-22 NOTE — Therapy (Signed)
[facility] [address] [place], [place], [address] Phone: [phone]   Fax:  [phone]  Occupational Therapy Treatment  Patient Details  Name: Zachary [name] MRN: [id number] Date of Birth: May 21, 1951 Referring Provider: Dr. Arbutus Leas   Encounter Date: 01/22/2018  OT End of Session - 01/22/18 1009    Visit Number  2    Number of Visits  17    Date for OT Re-Evaluation  03/09/18    Authorization Type  [employer] Medicare    OT Start Time  0806    OT Stop Time  0845    OT Time Calculation (min)  39 min       Past Medical History:  Diagnosis Date  . Allergy   . Asthma   . Parkinson's disease (HCC)    Congenital R fourth nerve palsy    Past Surgical History:  Procedure Laterality Date  . CATARACT EXTRACTION, BILATERAL    . COLONOSCOPY    . SHOULDER SURGERY Left    lymph nodes  . TONSILLECTOMY    . VASECTOMY      There were no vitals filed for this visit.  Subjective Assessment - 01/22/18 1008    Pertinent History  PD diagnosed 2000, asthma    Currently in Pain?  No/denies                           OT Education - 01/22/18 1010    Education provided  Yes    Education Details  coordination HEP, see pt instructions, handwriting strategies and practice, min-mod v.c to slow down    Person(s) Educated  Patient    Methods  Explanation;Demonstration;Verbal cues;Handout    Comprehension  Verbalized understanding;Returned demonstration       OT Short Term Goals - 01/08/18 1002      OT SHORT TERM GOAL #1   Title  I with PD specific HEP-02/07/18    Time  4    Period  Weeks    Status  New    Target Date  02/07/18      OT SHORT TERM GOAL #2   Title  Pt will verbalize understanding of adapted strategies for ADLS/IADLS in order to maximize safety and independence.    Time  4    Period  Weeks    Status  New      OT SHORT TERM GOAL #3   Title  Pt will demonstrate improved independence with  dressing as evidenced by decreasing 3 button/ unbutton time to 2 mins or less.    Baseline  2 mins 27 secs    Time  4    Period  Weeks    Status  New      OT SHORT TERM GOAL #4   Title  assess PPT#4 and set goal PRN.    Time  4    Period  Weeks    Status  New      OT SHORT TERM GOAL #5   Title  Pt will demonstrate improved LUE functional reach as evidenced by retrieving a lightweight object at 125 shoulder flexion with -10 elbow ext.    Time  4    Period  Weeks    Status  New      Additional Short Term Goals   Additional Short Term Goals  Yes      OT SHORT TERM GOAL #6   Title  Pt will demonstrate ability to write  a short paragraph with 100% legibility and minimal decrease in letter size.    Baseline  75% legible    Time  4    Period  Weeks    Status  New        OT Long Term Goals - 01/08/18 1003      OT LONG TERM GOAL #1   Title  Pt will verbalize understanding of community resources and ways to prevent future PD related complications.    Time  8    Period  Weeks    Status  New    Target Date  03/09/18      OT LONG TERM GOAL #2   Title  Pt will verbalize understanding of compensatory strategies for short term memory deficits and ways to keep thinking skills sharp.    Time  8    Period  Weeks    Status  New      OT LONG TERM GOAL #3   Title  Pt will demonstrate improved ease with feeding as evidenced by decreasing PPT#2 simulated feeding to 15 secs or less    Baseline  17.78 secs    Time  8    Period  Weeks    Status  New      OT LONG TERM GOAL #4   Title  Pt will demonstrate improved fine motor coordination for ADLS as evidenced by decreasing LUE 9 hole peg test score by 3 secs.    Baseline  RUE 35.94 secs, LUE 38.06 secs    Time  8    Period  Weeks    Status  New            Plan - 01/22/18 1009    Clinical Impression Statement  Pt is progressing towards goals. He verbalizes understanding of inital HEP for coordination and handwriting strategies.     Current Impairments/barriers affecting progress:  cognitive deficits, visual perceptual deficits, length of time since inital onset    OT Frequency  2x / week    OT Duration  8 weeks    OT Treatment/Interventions  Self-care/ADL training;Therapeutic exercise;Visual/perceptual remediation/compensation;Patient/family education;Gait Training;Neuromuscular education;Moist Heat;Fluidtherapy;Energy conservation;Building services engineerunctional Mobility Training;Therapeutic activities;Cognitive remediation/compensation;Passive range of motion;Manual Therapy;DME and/or AE instruction;Ultrasound;Cryotherapy    Plan  PWR1 hands basic 4, ADL strategies    Consulted and Agree with Plan of Care  Patient       Patient will benefit from skilled therapeutic intervention in order to improve the following deficits and impairments:  Abnormal gait, Decreased knowledge of use of DME, Impaired flexibility, Impaired vision/preception, Pain, Decreased mobility, Decreased coordination, Decreased activity tolerance, Decreased endurance, Decreased range of motion, Decreased strength, Impaired tone, Impaired UE functional use, Impaired perceived functional ability, Difficulty walking, Decreased safety awareness, Decreased knowledge of precautions, Decreased balance  Visit Diagnosis: Other lack of coordination  Other symptoms and signs involving the musculoskeletal system    Problem List Patient Active Problem List   Diagnosis Date Noted  . Parkinson's disease (HCC) 05/04/2013    [name],[name] 01/22/2018, 10:12 AM Keene BreathKathryn [name], OTR/L [phone] Phone: [phone] 10:12 AM 03/27/19[facility] [address] [place], [place], [address] Phone: [phone]   Fax:  [phone]  Name: Zachary [name] MRN: [id number] Date of Birth: 1951/05/18

## 2018-01-27 ENCOUNTER — Ambulatory Visit: Payer: Medicare Other | Attending: Neurology | Admitting: Physical Therapy

## 2018-01-27 ENCOUNTER — Ambulatory Visit: Payer: Medicare Other | Admitting: Occupational Therapy

## 2018-01-27 DIAGNOSIS — R29898 Other symptoms and signs involving the musculoskeletal system: Secondary | ICD-10-CM | POA: Insufficient documentation

## 2018-01-27 DIAGNOSIS — R471 Dysarthria and anarthria: Secondary | ICD-10-CM | POA: Diagnosis present

## 2018-01-27 DIAGNOSIS — R2681 Unsteadiness on feet: Secondary | ICD-10-CM | POA: Diagnosis not present

## 2018-01-27 DIAGNOSIS — R41842 Visuospatial deficit: Secondary | ICD-10-CM | POA: Diagnosis present

## 2018-01-27 DIAGNOSIS — R41844 Frontal lobe and executive function deficit: Secondary | ICD-10-CM | POA: Diagnosis present

## 2018-01-27 DIAGNOSIS — R29818 Other symptoms and signs involving the nervous system: Secondary | ICD-10-CM

## 2018-01-27 DIAGNOSIS — R2689 Other abnormalities of gait and mobility: Secondary | ICD-10-CM | POA: Insufficient documentation

## 2018-01-27 DIAGNOSIS — R278 Other lack of coordination: Secondary | ICD-10-CM | POA: Diagnosis present

## 2018-01-27 DIAGNOSIS — R293 Abnormal posture: Secondary | ICD-10-CM | POA: Insufficient documentation

## 2018-01-27 DIAGNOSIS — R41841 Cognitive communication deficit: Secondary | ICD-10-CM | POA: Insufficient documentation

## 2018-01-27 NOTE — Therapy (Signed)
[facility] [address] [place], [place], [address] Phone: [phone]   Fax:  [phone]  Occupational Therapy Treatment  Patient Details  Name: Zachary [name] MRN: [id number] Date of Birth: Jun 12, 1951 Referring Provider: Dr. Arbutus Leasat   Encounter Date: 01/27/2018  OT End of Session - 01/27/18 0849    Visit Number  3    Number of Visits  17    Date for OT Re-Evaluation  03/09/18    Authorization Type  [employer] Medicare    OT Start Time  0848    OT Stop Time  0930    OT Time Calculation (min)  42 min    Activity Tolerance  Patient tolerated treatment well    Behavior During Therapy  [facility] for tasks assessed/performed;Impulsive       Past Medical History:  Diagnosis Date  . Allergy   . Asthma   . Parkinson's disease (HCC)    Congenital R fourth nerve palsy    Past Surgical History:  Procedure Laterality Date  . CATARACT EXTRACTION, BILATERAL    . COLONOSCOPY    . SHOULDER SURGERY Left    lymph nodes  . TONSILLECTOMY    . VASECTOMY      There were no vitals filed for this visit.  Subjective Assessment - 01/27/18 0849    Subjective   Pt denies pain    Patient Stated Goals  improve perfromance of daily activities    Currently in Pain?  No/denies              Treatment: standing to perform functional reach to copy small peg design min v.c for performance, and larger amplitude movements.             OT Education - 01/27/18 1311    Education provided  Yes    Education Details  PWR! 4 in seated, min v.c, PWR!hands basic 4, 5-10 reps each, reviewed rotating ball and flipping/ dealing playing cards from HEP    Person(s) Educated  Patient    Methods  Explanation;Demonstration;Verbal cues    Comprehension  Verbalized understanding;Returned demonstration;Verbal cues required min-mod v.c   min-mod v.c      OT Short Term Goals - 01/27/18 [id number]      OT SHORT TERM GOAL #1   Title  I with PD specific  HEP-02/07/18    Time  4    Period  Weeks    Status  New      OT SHORT TERM GOAL #2   Title  Pt will verbalize understanding of adapted strategies for ADLS/IADLS in order to maximize safety and independence.    Time  4    Period  Weeks    Status  New      OT SHORT TERM GOAL #3   Title  Pt will demonstrate improved independence with dressing as evidenced by decreasing 3 button/ unbutton time to 2 mins or less.    Baseline  2 mins 27 secs    Time  4    Period  Weeks    Status  New      OT SHORT TERM GOAL #4   Title  Pt will demonstrate increased ease with dressing as evidenced by decreasing PPT #4 by 3 secs.    Baseline  23.96 sec    Time  4    Period  Weeks    Status  New      OT SHORT TERM GOAL #5   Title  Pt will  demonstrate improved LUE functional reach as evidenced by retrieving a lightweight object at 125 shoulder flexion with -10 elbow ext.    Time  4    Period  Weeks    Status  New      OT SHORT TERM GOAL #6   Title  Pt will demonstrate ability to write a short paragraph with 100% legibility and minimal decrease in letter size.    Baseline  75% legible    Time  4    Period  Weeks    Status  New        OT Long Term Goals - 01/08/18 1003      OT LONG TERM GOAL #1   Title  Pt will verbalize understanding of community resources and ways to prevent future PD related complications.    Time  8    Period  Weeks    Status  New    Target Date  03/09/18      OT LONG TERM GOAL #2   Title  Pt will verbalize understanding of compensatory strategies for short term memory deficits and ways to keep thinking skills sharp.    Time  8    Period  Weeks    Status  New      OT LONG TERM GOAL #3   Title  Pt will demonstrate improved ease with feeding as evidenced by decreasing PPT#2 simulated feeding to 15 secs or less    Baseline  17.78 secs    Time  8    Period  Weeks    Status  New      OT LONG TERM GOAL #4   Title  Pt will demonstrate improved fine motor coordination  for ADLS as evidenced by decreasing LUE 9 hole peg test score by 3 secs.    Baseline  RUE 35.94 secs, LUE 38.06 secs    Time  8    Period  Weeks    Status  New            Plan - 01/27/18 0850    Clinical Impression Statement  Pt is progressing towards goals. He is able to benefit from reinforcement of PWR! exercises to reinforce larger amplitude movements.    Rehab Potential  Good    Current Impairments/barriers affecting progress:  cognitive deficits, visual perceptual deficits, length of time since inital onset    OT Frequency  2x / week    OT Duration  8 weeks    OT Treatment/Interventions  Self-care/ADL training;Therapeutic exercise;Visual/perceptual remediation/compensation;Patient/family education;Gait Training;Neuromuscular education;Moist Heat;Fluidtherapy;Energy conservation;Building services engineer;Therapeutic activities;Cognitive remediation/compensation;Passive range of motion;Manual Therapy;DME and/or AE instruction;Ultrasound;Cryotherapy    Plan  ADL strategies    Consulted and Agree with Plan of Care  Patient       Patient will benefit from skilled therapeutic intervention in order to improve the following deficits and impairments:  Abnormal gait, Decreased knowledge of use of DME, Impaired flexibility, Impaired vision/preception, Pain, Decreased mobility, Decreased coordination, Decreased activity tolerance, Decreased endurance, Decreased range of motion, Decreased strength, Impaired tone, Impaired UE functional use, Impaired perceived functional ability, Difficulty walking, Decreased safety awareness, Decreased knowledge of precautions, Decreased balance  Visit Diagnosis: Other lack of coordination  Other symptoms and signs involving the musculoskeletal system  Unsteadiness on feet  Frontal lobe and executive function deficit  Visuospatial deficit  Other symptoms and signs involving the nervous system    Problem List Patient Active Problem List    Diagnosis Date Noted  . Parkinson's disease (HCC) 05/04/2013  Zachary [name] 01/27/2018, 1:15 PM  [facility] [address] [place], [place], [address] Phone: [phone]   Fax:  [phone]  Name: Zachary [name] MRN: [id number] Date of Birth: [birth year]

## 2018-01-27 NOTE — Therapy (Signed)
[facility] [address] [place], [place], [address] Phone: [phone]   Fax:  [phone]  Physical Therapy Treatment  Patient Details  Name: Zachary [name] MRN: [id number] Date of Birth: [birth year] Referring Provider: Dr. Arbutus Leas   Encounter Date: 01/27/2018  PT End of Session - 01/27/18 1004    Visit Number  3    Number of Visits  13    Date for PT Re-Evaluation  03/09/18    Authorization Type  [employer]HC Medicare    PT Start Time  0805    PT Stop Time  0847    PT Time Calculation (min)  42 min    Activity Tolerance  Patient tolerated treatment well    Behavior During Therapy  [facility] for tasks assessed/performed;Impulsive       Past Medical History:  Diagnosis Date  . Allergy   . Asthma   . Parkinson's disease (HCC)    Congenital R fourth nerve palsy    Past Surgical History:  Procedure Laterality Date  . CATARACT EXTRACTION, BILATERAL    . COLONOSCOPY    . SHOULDER SURGERY Left    lymph nodes  . TONSILLECTOMY    . VASECTOMY      There were no vitals filed for this visit.  Subjective Assessment - 01/27/18 0807    Subjective  Did have a fall out of bed trying to maneuver out last week.   The bed is high; wife reports finding him getting out backwards, facing bed or trying to reach for post of chair near bed and questions need of bedrail.    Patient is accompained by:  Family member wife    Patient Stated Goals  Pt's goal for therapy is to trying to get better balance and eliminate the shuffling.    Currently in Pain?  No/denies                No data recorded       [facility] Adult PT Treatment/Exercise - 01/27/18 0001      Transfers   Transfers  Sit to Stand;Stand to Sit    Sit to Stand  6: Modified independent (Device/Increase time);Without upper extremity assist;From chair/3-in-1    Stand to Sit  6: Modified independent (Device/Increase time);Without upper extremity assist;To chair/3-in-1     Number of Reps  10 reps;Other sets (comment) 5 sets; from varied height surfaces/feet on varied surfaces    Transfer Cueing  Practiced from 22", 17", 14" soft height, then from 22" standing on blue foam pad, then from 20" standing on blue balance beam.  Cues for increased forward lean to initiate standing and upright posture upon standing (pt has posterior lean after about 5 reps in each set).  Cues for use of hands forward and then PWR! Up posture (verbal and tactile cues)      High Level Balance   High Level Balance Comments  Rocking activities-wide BOS PWR! Moves standing with rock and reach x 10, then rock and reach, lifting each leg, x 10-15 reps with cues for increased SLS/deliberate stance.  Worked on negotiating around, through, and on varied height obstacles, using rocking/weightshifting for safe obstacle negotiataion and turning practice, then on ground /no obstacles worked on turning with wide BOS       Bed mobility:  Worked on various aspects of bed mobility:  Pt shows PT and wife how he gets in and out of bed, which seems to be scooting into long sit position, then bringing  legs in or out of bed (at simulated 32" height).  Educated patient on sit>sidelying>supine technique again for bed mobility, and then supine>sidelying>sit, practicing multiple reps.  Also, practiced using simulate bed rail (elevated walker handle), x 2 reps, for improved assistance of bed mobility.  Incorporated PWR! Rock in supine, to initiate rolling to sidelying, x 3 reps.  Discussed importance of trying to do bed mobility the same technique all the time, in order to improve performance and safety during off-periods of medication.       PT Education - 01/27/18 1003    Education provided  Yes    Education Details  Bed mobility techniques for optimal safety; recommendations for potential use of bed rail to assist pushing up to sit.    Person(s) Educated  Patient;Spouse wife present at beginning of session     Methods  Explanation;Demonstration;Verbal cues    Comprehension  Verbalized understanding;Returned demonstration;Verbal cues required;Need further instruction       PT Short Term Goals - 01/08/18 1750      PT SHORT TERM GOAL #1   Title  Pt will perform HEP with family's supervision, to address pt's Parkinson's related deficits.  TARGET 02/07/18    Time  4    Period  Weeks    Status  New    Target Date  02/07/18      PT SHORT TERM GOAL #2   Title  Pt will perform 5x sit<>stand without posterior lean/pull for improved transfer efficiency and safety.    Time  4    Period  Weeks    Status  New    Target Date  02/07/18      PT SHORT TERM GOAL #3   Title  Pt/wife will verbalize understanding of tips to reduce freezing episodes with gait and turns.    Time  4    Period  Weeks    Status  New    Target Date  02/07/18      PT SHORT TERM GOAL #4   Title  Pt will perform TUG and TUG cognitive without LOB during turns, 2 of 3 trials.    Time  4    Period  Weeks    Status  New    Target Date  02/07/18        PT Long Term Goals - 01/08/18 1757      PT LONG TERM GOAL #1   Title  Pt/wife will verbalize understanding of fall prevention in home environment.  TARGET4/26/19    Time  6    Period  Weeks    Status  New    Target Date  02/21/17      PT LONG TERM GOAL #2   Title  Pt will perform at least 8 of 10 reps of sit<>stand transfers from lower than 18" surfaces, no posterior lean or LOB.    Time  6    Period  Weeks    Status  New    Target Date  02/21/18      PT LONG TERM GOAL #3   Title  Pt will report at least 50% improvement in bed mobility.    Time  6    Period  Weeks    Status  New    Target Date  02/21/18      PT LONG TERM GOAL #4   Title  Pt/wife will report at least 50% improvement in transport wheelchair negotiation for night-time functional mobility.    Time  6  Period  Weeks    Status  New    Target Date  02/21/18      PT LONG TERM GOAL #5   Title   Pt/wife will verbalize strategies and cueing methods to assist in "off-time" in medication cycle for improved mobility.    Time  6    Period  Weeks    Status  New    Target Date  02/21/18            Plan - 01/27/18 1004    Clinical Impression Statement  Wife present at beginning of session, and PT focused initially on bed mobility, as pt had a recent fall out of bed trying to get up in middle of the night.  Tried to simulate the height of their bed at home, and highlighted need for repeated practice of bed mobility technique of sit<>sidelying<>supine, with added use of simulated bedrail for improved safety with bed mobility.  Also worked on transfers and Editor, commissioningdynamic balance activities.  Pt will continue to benefit from skilled PT to address gait, turns, bed mobility activities.  Pt again having "on-time" with medications during today's session.    Rehab Potential  Good    PT Frequency  2x / week    PT Duration  6 weeks plus eval    PT Treatment/Interventions  ADLs/Self Care Home Management;DME Instruction;Gait training;Stair training;Functional mobility training;Therapeutic activities;Therapeutic exercise;Balance training;Patient/family education;Neuromuscular re-education    PT Next Visit Plan  If patient in "off-time" of medication cycle-will need to repeat funcitonal measures; review bed mobility, transfers low surfaces, balance activities for turns, changes of direction    Consulted and Agree with Plan of Care  Patient;Family member/caregiver       Patient will benefit from skilled therapeutic intervention in order to improve the following deficits and impairments:  Abnormal gait, Decreased balance, Decreased safety awareness, Decreased mobility, Difficulty walking, Impaired tone, Postural dysfunction  Visit Diagnosis: Unsteadiness on feet  Other symptoms and signs involving the nervous system  Other abnormalities of gait and mobility     Problem List Patient Active Problem List    Diagnosis Date Noted  . Parkinson's disease (HCC) 05/04/2013    Aleph Nickson W. 01/27/2018, 10:08 AM  Gean MaidensMARRIOTT,Ganesh Deeg W., PT   [facility] [address] [place], [place], [address] Phone: [phone]   Fax:  [phone]  Name: Zachary [name] MRN: [id number] Date of Birth: November 11, 1950

## 2018-01-29 ENCOUNTER — Encounter: Payer: Self-pay | Admitting: Physical Therapy

## 2018-01-29 ENCOUNTER — Ambulatory Visit: Payer: Medicare Other

## 2018-01-29 ENCOUNTER — Ambulatory Visit: Payer: Medicare Other | Admitting: Physical Therapy

## 2018-01-29 ENCOUNTER — Ambulatory Visit: Payer: Medicare Other | Admitting: Occupational Therapy

## 2018-01-29 DIAGNOSIS — R41844 Frontal lobe and executive function deficit: Secondary | ICD-10-CM

## 2018-01-29 DIAGNOSIS — R2689 Other abnormalities of gait and mobility: Secondary | ICD-10-CM

## 2018-01-29 DIAGNOSIS — R29898 Other symptoms and signs involving the musculoskeletal system: Secondary | ICD-10-CM

## 2018-01-29 DIAGNOSIS — R41842 Visuospatial deficit: Secondary | ICD-10-CM

## 2018-01-29 DIAGNOSIS — R29818 Other symptoms and signs involving the nervous system: Secondary | ICD-10-CM

## 2018-01-29 DIAGNOSIS — R2681 Unsteadiness on feet: Secondary | ICD-10-CM

## 2018-01-29 DIAGNOSIS — R471 Dysarthria and anarthria: Secondary | ICD-10-CM

## 2018-01-29 DIAGNOSIS — R278 Other lack of coordination: Secondary | ICD-10-CM

## 2018-01-29 DIAGNOSIS — R41841 Cognitive communication deficit: Secondary | ICD-10-CM

## 2018-01-29 NOTE — Therapy (Signed)
[facility] [address] [place], [place], [address] Phone: [phone]   Fax:  [phone]  Occupational Therapy Treatment  Patient Details  Name: Zachary LahStuart D [name] MRN: [id number] Date of Birth: 09/03/1951 Referring Provider: Dr. Arbutus Leasat   Encounter Date: 01/29/2018  OT End of Session - 01/29/18 0807    Visit Number  4    Number of Visits  17    Date for OT Re-Evaluation  03/09/18    Authorization Type  [employer] Medicare    OT Start Time  0803    OT Stop Time  0845    OT Time Calculation (min)  42 min       Past Medical History:  Diagnosis Date  . Allergy   . Asthma   . Parkinson's disease (HCC)    Congenital R fourth nerve palsy    Past Surgical History:  Procedure Laterality Date  . CATARACT EXTRACTION, BILATERAL    . COLONOSCOPY    . SHOULDER SURGERY Left    lymph nodes  . TONSILLECTOMY    . VASECTOMY      There were no vitals filed for this visit.  Subjective Assessment - 01/29/18 0807    Subjective   Pt denies pain    Pertinent History  PD diagnosed 2000, asthma    Patient Stated Goals  improve perfromance of daily activities    Currently in Pain?  No/denies                Treatment: Arm bike x 5 mins level 3 for conditioning, pt maintained 40-55 rpm PWR! hands followed by education regarding strategies for fastening buttons, mod v.c/demononstration. Grooved pegboard with mod difficulty, min v.c for performance using LUE for fine motor coordination. Dynamic step and reach to flip large playing cards to targets with left and right UE's, mod v.c Pt is noted to move quickly and impulsively with all activities requiring cueing to slow down.              OT Short Term Goals - 01/27/18 [id number]      OT SHORT TERM GOAL #1   Title  I with PD specific HEP-02/07/18    Time  4    Period  Weeks    Status  New      OT SHORT TERM GOAL #2   Title  Pt will verbalize understanding of adapted  strategies for ADLS/IADLS in order to maximize safety and independence.    Time  4    Period  Weeks    Status  New      OT SHORT TERM GOAL #3   Title  Pt will demonstrate improved independence with dressing as evidenced by decreasing 3 button/ unbutton time to 2 mins or less.    Baseline  2 mins 27 secs    Time  4    Period  Weeks    Status  New      OT SHORT TERM GOAL #4   Title  Pt will demonstrate increased ease with dressing as evidenced by decreasing PPT #4 by 3 secs.    Baseline  23.96 sec    Time  4    Period  Weeks    Status  New      OT SHORT TERM GOAL #5   Title  Pt will demonstrate improved LUE functional reach as evidenced by retrieving a lightweight object at 125 shoulder flexion with -10 elbow ext.    Time  4  Period  Weeks    Status  New      OT SHORT TERM GOAL #6   Title  Pt will demonstrate ability to write a short paragraph with 100% legibility and minimal decrease in letter size.    Baseline  75% legible    Time  4    Period  Weeks    Status  New        OT Long Term Goals - 01/08/18 1003      OT LONG TERM GOAL #1   Title  Pt will verbalize understanding of community resources and ways to prevent future PD related complications.    Time  8    Period  Weeks    Status  New    Target Date  03/09/18      OT LONG TERM GOAL #2   Title  Pt will verbalize understanding of compensatory strategies for short term memory deficits and ways to keep thinking skills sharp.    Time  8    Period  Weeks    Status  New      OT LONG TERM GOAL #3   Title  Pt will demonstrate improved ease with feeding as evidenced by decreasing PPT#2 simulated feeding to 15 secs or less    Baseline  17.78 secs    Time  8    Period  Weeks    Status  New      OT LONG TERM GOAL #4   Title  Pt will demonstrate improved fine motor coordination for ADLS as evidenced by decreasing LUE 9 hole peg test score by 3 secs.    Baseline  RUE 35.94 secs, LUE 38.06 secs    Time  8    Period   Weeks    Status  New            Plan - 01/29/18 1610    Clinical Impression Statement  Pt is progressing towards goals. He is  requires v.c to slow down and to perfrom larger movements.    Rehab Potential  Good    Current Impairments/barriers affecting progress:  cognitive deficits, visual perceptual deficits, length of time since inital onset    OT Frequency  2x / week    OT Duration  8 weeks    OT Treatment/Interventions  Self-care/ADL training;Therapeutic exercise;Visual/perceptual remediation/compensation;Patient/family education;Gait Training;Neuromuscular education;Moist Heat;Fluidtherapy;Energy conservation;Building services engineer;Therapeutic activities;Cognitive remediation/compensation;Passive range of motion;Manual Therapy;DME and/or AE instruction;Ultrasound;Cryotherapy    Plan  continue to address large amplitude movements with ADLS    Consulted and Agree with Plan of Care  Patient       Patient will benefit from skilled therapeutic intervention in order to improve the following deficits and impairments:  Abnormal gait, Decreased knowledge of use of DME, Impaired flexibility, Impaired vision/preception, Pain, Decreased mobility, Decreased coordination, Decreased activity tolerance, Decreased endurance, Decreased range of motion, Decreased strength, Impaired tone, Impaired UE functional use, Impaired perceived functional ability, Difficulty walking, Decreased safety awareness, Decreased knowledge of precautions, Decreased balance  Visit Diagnosis: Other lack of coordination  Unsteadiness on feet  Other symptoms and signs involving the musculoskeletal system  Frontal lobe and executive function deficit  Visuospatial deficit    Problem List Patient Active Problem List   Diagnosis Date Noted  . Parkinson's disease (HCC) 05/04/2013    [name],Zachary 01/29/2018, 1:01 PM  [place] [facility] [address] [place], [place], [address] Phone: [phone]   Fax:  [phone]  Name: Zachary [name]  MRN: [id number] Date of Birth: 22-May-1951

## 2018-01-29 NOTE — Therapy (Addendum)
[facility] [address] [place], [place], [address] Phone: [phone]   Fax:  [phone]  Speech Language Pathology Evaluation  Patient Details  Name: Zachary [name] MRN: [id number] Date of Birth: September 29, 1951 Referring Provider: [name], DO   Encounter Date: 01/29/2018  End of Session - 01/29/18 1722    Visit Number  1    Number of Visits  17    Date for SLP Re-Evaluation  04/11/18    SLP Start Time  0934    SLP Stop Time   1015    SLP Time Calculation (min)  40 min       Past Medical History:  Diagnosis Date  . Allergy   . Asthma   . Parkinson's disease (HCC)    Congenital R fourth nerve palsy    Past Surgical History:  Procedure Laterality Date  . CATARACT EXTRACTION, BILATERAL    . COLONOSCOPY    . SHOULDER SURGERY Left    lymph nodes  . TONSILLECTOMY    . VASECTOMY      There were no vitals filed for this visit.      SLP Evaluation [facility] - 01/29/18 6962      SLP Visit Information   SLP Received On  01/29/18    Referring Provider  [name], Lurena Joiner, DO    Onset Date  approx 1999    Medical Diagnosis  Parkinson's Disease      Subjective   Subjective  Pt reports lower than WNL volume    Patient/Family Stated Goal  Improve volume in conversation so pt is more easily understood      Pain Assessment   Currently in Pain?  No/denies      General Information   HPI  Pt reports wife asks pt to repeat often at home, on the phone and at restaurants are particularly difficult for pt to maintain normal loudness.       Balance Screen   Has the patient fallen in the past 6 months  Yes    How many times?  4    Has the patient had a decrease in activity level because of a fear of falling?   No    Is the patient reluctant to leave their home because of a fear of falling?   No      Prior Functional Status   Cognitive/Linguistic Baseline  Baseline deficits    Baseline deficit details  MCI ID'd from  Dr. Nigel Sloop evaluation     Type of Home  House     Lives With  Spouse    Vocation  Retired professor of econ at [employer]   Overall Cognitive Status  History of cognitive impairments - at baseline      Auditory Comprehension   Overall Auditory Comprehension  Appears within functional limits for tasks assessed      Verbal Expression   Overall Verbal Expression  Appears within functional limits for tasks assessed      Oral Motor/Sensory Function   Overall Oral Motor/Sensory Function  Impaired    Labial ROM  Reduced right;Reduced left    Labial Symmetry  Within Functional Limits    Labial Strength  Reduced    Labial Coordination  Reduced    Lingual ROM  Reduced left    Lingual Symmetry  Within Functional Limits    Lingual Strength  Reduced lt moreso than rt    Lingual Sensation  Within Functional Limits  Lingual Coordination  Reduced    Velum  Within Functional Limits slightly sluggish    Overall Oral Motor/Sensory Function  some pausing/hesitation in alternate repetitive motions- lingually and labially      Motor Speech   Overall Motor Speech  Impaired    Respiration  Impaired uses residual volume    Level of Impairment  Word    Phonation  Low vocal intensity;Breathy    Intelligibility  Intelligibility reduced    Conversation  -- 95-100% in quiet environment     Effective Techniques  Increased vocal intensity average 69.56 dB over 60 second monologue                       SLP Education - 01/29/18 1721    Education provided  Yes    Education Details  rationale for loud /a/, how to perform loud /a/, results of eval (measurements of WNL speech volume)    Person(s) Educated  Patient    Methods  Explanation;Demonstration;Verbal cues    Comprehension  Verbal cues required;Verbalized understanding;Need further instruction;Returned demonstration       SLP Short Term Goals - 01/29/18 1727      SLP SHORT TERM GOAL #1   Title  Pt will achieve average  loud /a/ of average low 90sdB over 3 sessions    Time  4    Period  Weeks    Status  New      SLP SHORT TERM GOAL #2   Title  Pt will use abdominal breathing at rest 75% success over 2 sessions    Time  4    Period  Weeks    Status  New      SLP SHORT TERM GOAL #3   Title  Pt will use speech volume average low 70sdB when responding with sentences 18/20 over two sessions    Time  4    Period  Weeks    Status  New       SLP Long Term Goals - 01/29/18 1728      SLP LONG TERM GOAL #1   Title  Pt will generate loud /a/ with average of low 90s dB over 6 sessions    Time  8    Period  Weeks or 17 sessions    Status  New      SLP LONG TERM GOAL #2   Title  Pt will use abdominal breathing 70% of the time in 10 minutes simple conversation over three sessions    Time  8    Period  Weeks or 17 sessions    Status  New      SLP LONG TERM GOAL #3   Title  Pt will use speech volume average low 70sdB in 8 minutes of simple-mod complex conversation over three sessions    Time  8    Period  Weeks or 17 sessions    Status  New       Plan - 01/29/18 1723    Clinical Impression Statement  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 With focus on LOUD speech and incr'd effort pt averaged 70dB average in a 2 minute monologue. Pt also has MCI ID'd from neuropsych testing, however pt's cognition was not evaluated today. He would benefit from a course of skilled ST focusing mainly on increasing loudness in conversation to improve pt's QOL. Cognitive testing and  goals may be initiated at a later date.    Speech Therapy Frequency  2x / week    Duration  -- 8 weeks, or 17 total sessions       Patient will benefit from skilled therapeutic intervention in order to improve the following deficits and impairments:   Dysarthria and anarthria  Cognitive communication  deficit    Problem List Patient Active Problem List   Diagnosis Date Noted  . Parkinson's disease (HCC) 05/04/2013    [facility]IN[facility],Tarren Sabree ,MS, CCC-SLP  01/29/2018, 5:32 PM  [place] [facility] [address] [place], [place], [address] Phone: [phone]   Fax:  [phone]  Name: Zachary [name] MRN: [id number] Date of Birth: [birth year]

## 2018-01-29 NOTE — Patient Instructions (Signed)
   Do 5 loud "ah" , twice a day. Feel the push from your diaphragm- the bottom of your chair  Read for 30 seconds - 6-7 times, twice a day. Think LOUD

## 2018-01-30 NOTE — Therapy (Signed)
[facility] [address] [place], [place], [address] Phone: [phone]   Fax:  [phone]  Physical Therapy Treatment  Patient Details  Name: Zachary [name] MRN: [id number] Date of Birth: 08-21-1951 Referring Provider: Dr. Arbutus Leas   Encounter Date: 01/29/2018  PT End of Session - 01/30/18 1256    Visit Number  3    Number of Visits  13    Date for PT Re-Evaluation  03/09/18    Authorization Type  [employer] Medicare    PT Start Time  0848    PT Stop Time  0930    PT Time Calculation (min)  42 min    Activity Tolerance  Patient tolerated treatment well    Behavior During Therapy  [facility] for tasks assessed/performed;Impulsive       Past Medical History:  Diagnosis Date  . Allergy   . Asthma   . Parkinson's disease (HCC)    Congenital R fourth nerve palsy    Past Surgical History:  Procedure Laterality Date  . CATARACT EXTRACTION, BILATERAL    . COLONOSCOPY    . SHOULDER SURGERY Left    lymph nodes  . TONSILLECTOMY    . VASECTOMY      There were no vitals filed for this visit.  Subjective Assessment - 01/29/18 0850    Subjective  No changes since last visit.    Patient is accompained by:  Family member wife    Patient Stated Goals  Pt's goal for therapy is to trying to get better balance and eliminate the shuffling.    Currently in Pain?  No/denies                       [facility] Adult PT Treatment/Exercise - 01/29/18 0851      Transfers   Transfers  Sit to Stand;Stand to Sit    Sit to Stand  6: Modified independent (Device/Increase time);Without upper extremity assist;From chair/3-in-1    Stand to Sit  6: Modified independent (Device/Increase time);Without upper extremity assist;To chair/3-in-1    Number of Reps  10 reps;Other sets (comment) 4 sets from varied height surfaces    Transfer Cueing  Practiced from 22" mat surface, 17", 16" chairs, then 14" with padded surface    Comments  Sit<>stand  followed by PWR! Up, then sit<>stand followed by PWR! Rock to initiated turns in small spaces to sit in varied chairs       High Level Balance/Gait   High Level Balance Activities  Turns;Figure 8 turns;Weight-shifting turns step-together turns from stopped position.    High Level Balance Comments  Practiced figure-8 turns, then weightshifting turns with wide BOS, then progressed to walking to pick up items for increased complexity of task to work on gait, carrying, turns.  Pt needs re-direction to task for correct performance of activity.        [facility]) - 01/30/18 1252    PWR! exercises  Moves in standing    PWR! Up  x 20 reps    PWR! Rock  x 20 reps    PWR! Twist  x 20 reps    PWR Step  x 20 reps to side direction    Comments  Cues for large amplitude, high intensity, deliberate technique, to address both aerobic-type exercise and PD-specific exercise for home.          PT Education - 01/30/18 1256    Education provided  Yes    Education Details  PWR! Moves in standing as part of HEP    Person(s) Educated  Patient    Methods  Explanation;Demonstration;Verbal cues;Handout    Comprehension  Verbalized understanding;Returned demonstration;Need further instruction;Verbal cues required       PT Short Term Goals - 01/08/18 1750      PT SHORT TERM GOAL #1   Title  Pt will perform HEP with family's supervision, to address pt's Parkinson's related deficits.  TARGET 02/07/18    Time  4    Period  Weeks    Status  New    Target Date  02/07/18      PT SHORT TERM GOAL #2   Title  Pt will perform 5x sit<>stand without posterior lean/pull for improved transfer efficiency and safety.    Time  4    Period  Weeks    Status  New    Target Date  02/07/18      PT SHORT TERM GOAL #3   Title  Pt/wife will verbalize understanding of tips to reduce freezing episodes with gait and turns.    Time  4    Period  Weeks    Status  New    Target Date  02/07/18      PT SHORT TERM GOAL #4    Title  Pt will perform TUG and TUG cognitive without LOB during turns, 2 of 3 trials.    Time  4    Period  Weeks    Status  New    Target Date  02/07/18        PT Long Term Goals - 01/08/18 1757      PT LONG TERM GOAL #1   Title  Pt/wife will verbalize understanding of fall prevention in home environment.  TARGET4/26/19    Time  6    Period  Weeks    Status  New    Target Date  02/21/17      PT LONG TERM GOAL #2   Title  Pt will perform at least 8 of 10 reps of sit<>stand transfers from lower than 18" surfaces, no posterior lean or LOB.    Time  6    Period  Weeks    Status  New    Target Date  02/21/18      PT LONG TERM GOAL #3   Title  Pt will report at least 50% improvement in bed mobility.    Time  6    Period  Weeks    Status  New    Target Date  02/21/18      PT LONG TERM GOAL #4   Title  Pt/wife will report at least 50% improvement in transport wheelchair negotiation for night-time functional mobility.    Time  6    Period  Weeks    Status  New    Target Date  02/21/18      PT LONG TERM GOAL #5   Title  Pt/wife will verbalize strategies and cueing methods to assist in "off-time" in medication cycle for improved mobility.    Time  6    Period  Weeks    Status  New    Target Date  02/21/18            Plan - 01/30/18 1258    Clinical Impression Statement  Skilled PT session today focused on transfers, turning techniques, with increased complexity of task with carrying items (pt needs repeated cues for correct performance of activity).  Also initiated HEP  with standing PWR! MOves-both for aerobic type activity and PD-specific movement patterns for home exercise.  Pt will continue to benefit from further skilled PT to address gait, turns, transfers.  Did not work on bed mobility today, as pt feels "doing it status quo" will work just fine and does not want to try today.    Rehab Potential  Good    PT Frequency  2x / week    PT Duration  6 weeks plus eval     PT Treatment/Interventions  ADLs/Self Care Home Management;DME Instruction;Gait training;Stair training;Functional mobility training;Therapeutic activities;Therapeutic exercise;Balance training;Patient/family education;Neuromuscular re-education    PT Next Visit Plan  If patient in "off-time" of medication cycle-will need to repeat funcitonal measures; review bed mobility as needed, transfers low surfaces, balance activities for turns, changes of direction    Consulted and Agree with Plan of Care  Patient;Family member/caregiver       Patient will benefit from skilled therapeutic intervention in order to improve the following deficits and impairments:  Abnormal gait, Decreased balance, Decreased safety awareness, Decreased mobility, Difficulty walking, Impaired tone, Postural dysfunction  Visit Diagnosis: Unsteadiness on feet  Other symptoms and signs involving the nervous system  Other abnormalities of gait and mobility     Problem List Patient Active Problem List   Diagnosis Date Noted  . Parkinson's disease (HCC) 05/04/2013    Bula Cavalieri W. 01/30/2018, 1:11 PM Gean MaidensMARRIOTT,Sakai Heinle W., PT  [facility] [address] [place], [place], [address] Phone: [phone]   Fax:  [phone]  Name: Zachary [name] MRN: [id number] Date of Birth: 12-Aug-1951

## 2018-02-03 ENCOUNTER — Ambulatory Visit: Payer: Medicare Other | Admitting: Occupational Therapy

## 2018-02-03 ENCOUNTER — Encounter: Payer: Self-pay | Admitting: Physical Therapy

## 2018-02-03 ENCOUNTER — Ambulatory Visit: Payer: Medicare Other | Admitting: Physical Therapy

## 2018-02-03 DIAGNOSIS — R2681 Unsteadiness on feet: Secondary | ICD-10-CM

## 2018-02-03 DIAGNOSIS — R278 Other lack of coordination: Secondary | ICD-10-CM

## 2018-02-03 DIAGNOSIS — R29818 Other symptoms and signs involving the nervous system: Secondary | ICD-10-CM

## 2018-02-03 DIAGNOSIS — R2689 Other abnormalities of gait and mobility: Secondary | ICD-10-CM

## 2018-02-03 DIAGNOSIS — R41844 Frontal lobe and executive function deficit: Secondary | ICD-10-CM

## 2018-02-03 NOTE — Patient Instructions (Addendum)
[facility]  -Kick BIG and dig in your heel BIG, alternating legs to generate POWER needed to propel your transport chair.    ALWAYS MOVE BIG AND DELIBERATE!!

## 2018-02-03 NOTE — Therapy (Signed)
[facility] [address] [place], [place], [address] Phone: [phone]   Fax:  [phone]  Occupational Therapy Treatment  Patient Details  Name: Zachary [name] MRN: [id number] Date of Birth: October 10, 1951 Referring Provider: Dr. Arbutus Leas   Encounter Date: 02/03/2018  OT End of Session - 02/03/18 1319    Visit Number  5    Number of Visits  17    Date for OT Re-Evaluation  03/09/18    Authorization Type  [employer] Medicare    OT Start Time  0800    OT Stop Time  0837 last 8 minutes filling out medical release form - unbillable     OT Time Calculation (min)  37 min    Activity Tolerance  Patient tolerated treatment well       Past Medical History:  Diagnosis Date  . Allergy   . Asthma   . Parkinson's disease (HCC)    Congenital R fourth nerve palsy    Past Surgical History:  Procedure Laterality Date  . CATARACT EXTRACTION, BILATERAL    . COLONOSCOPY    . SHOULDER SURGERY Left    lymph nodes  . TONSILLECTOMY    . VASECTOMY      There were no vitals filed for this visit.  Subjective Assessment - 02/03/18 0804    Pertinent History  PD diagnosed 2000, asthma    Patient Stated Goals  improve perfromance of daily activities    Currently in Pain?  No/denies                   OT Treatments/Exercises (OP) - 02/03/18 0001      ADLs   UB Dressing  Practiced hooking/unhooking 2 buttons on shirt (while on) today with great success - pt took his time and was not as rushed.     Writing  Reviewed handwriting strategies and practiced writing 5 sentences that used all letters of the alphabet with rest breaks/hand flicks prn.  Pt with decr. Legibility at end of each sentence d/t rushing   Medication Management  Practiced opening his pill box with large amplitude movement strategies and cues to slow down and stop/recalibrate if needed.     ADL Comments  Pt wanted to fill out a medical release form and insisted on doing  during last 8 min. of treatment time      Functional Reaching Activities   High Level  Standing with PWR! step pattern to reach and place small pegs in pegboard on vertical surface for coordination, BUE reaching, and dynamic balance while copying peg design with self corrected errors               OT Short Term Goals - 01/27/18 6962      OT SHORT TERM GOAL #1   Title  I with PD specific HEP-02/07/18    Time  4    Period  Weeks    Status  New      OT SHORT TERM GOAL #2   Title  Pt will verbalize understanding of adapted strategies for ADLS/IADLS in order to maximize safety and independence.    Time  4    Period  Weeks    Status  New      OT SHORT TERM GOAL #3   Title  Pt will demonstrate improved independence with dressing as evidenced by decreasing 3 button/ unbutton time to 2 mins or less.    Baseline  2 mins 27 secs  Time  4    Period  Weeks    Status  New      OT SHORT TERM GOAL #4   Title  Pt will demonstrate increased ease with dressing as evidenced by decreasing PPT #4 by 3 secs.    Baseline  23.96 sec    Time  4    Period  Weeks    Status  New      OT SHORT TERM GOAL #5   Title  Pt will demonstrate improved LUE functional reach as evidenced by retrieving a lightweight object at 125 shoulder flexion with -10 elbow ext.    Time  4    Period  Weeks    Status  New      OT SHORT TERM GOAL #6   Title  Pt will demonstrate ability to write a short paragraph with 100% legibility and minimal decrease in letter size.    Baseline  75% legible    Time  4    Period  Weeks    Status  New        OT Long Term Goals - 01/08/18 1003      OT LONG TERM GOAL #1   Title  Pt will verbalize understanding of community resources and ways to prevent future PD related complications.    Time  8    Period  Weeks    Status  New    Target Date  03/09/18      OT LONG TERM GOAL #2   Title  Pt will verbalize understanding of compensatory strategies for short term memory  deficits and ways to keep thinking skills sharp.    Time  8    Period  Weeks    Status  New      OT LONG TERM GOAL #3   Title  Pt will demonstrate improved ease with feeding as evidenced by decreasing PPT#2 simulated feeding to 15 secs or less    Baseline  17.78 secs    Time  8    Period  Weeks    Status  New      OT LONG TERM GOAL #4   Title  Pt will demonstrate improved fine motor coordination for ADLS as evidenced by decreasing LUE 9 hole peg test score by 3 secs.    Baseline  RUE 35.94 secs, LUE 38.06 secs    Time  8    Period  Weeks    Status  New            Plan - 02/03/18 1319    Clinical Impression Statement  Pt is progressing towards goals. He is  requires v.c to slow down and to perfrom larger movements.    Occupational Profile and client history currently impacting functional performance  PMH: PD diagnosed in 2000, asthma. 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 Pt participates in PWR! moves    Rehab Potential  Good    Current Impairments/barriers affecting progress:  cognitive deficits, visual perceptual deficits, length of time since inital onset    OT Frequency  2x / week    OT Duration  8 weeks    OT Treatment/Interventions  Self-care/ADL training;Therapeutic exercise;Visual/perceptual remediation/compensation;Patient/family education;Gait Training;Neuromuscular education;Moist Heat;Fluidtherapy;Energy conservation;Building services engineer;Therapeutic activities;Cognitive remediation/compensation;Passive range of motion;Manual Therapy;DME and/or AE instruction;Ultrasound;Cryotherapy    Plan  continue to address large amplitude movements with ADLS       Patient will benefit from skilled therapeutic intervention in order to improve the following  deficits and impairments:  Abnormal gait, Decreased knowledge of use of DME, Impaired flexibility, Impaired vision/preception, Pain, Decreased mobility, Decreased coordination, Decreased activity  tolerance, Decreased endurance, Decreased range of motion, Decreased strength, Impaired tone, Impaired UE functional use, Impaired perceived functional ability, Difficulty walking, Decreased safety awareness, Decreased knowledge of precautions, Decreased balance  Visit Diagnosis: Other lack of coordination  Other symptoms and signs involving the nervous system  Unsteadiness on feet  Frontal lobe and executive function deficit    Problem List Patient Active Problem List   Diagnosis Date Noted  . Parkinson's disease (HCC) 05/04/2013    Kelli ChurnBallie, Aubriegh Minch Johnson, OTR/L 02/03/2018, 1:20 PM  [facility] [address] [place], [place], [address] Phone: [phone]   Fax:  [phone]  Name: Zachary [name] MRN: [id number] Date of Birth: 05-Nov-1950

## 2018-02-03 NOTE — Therapy (Signed)
[facility] [address] [place], [place], [address] Phone: [phone]   Fax:  [phone]  Physical Therapy Treatment  Patient Details  Name: Zachary [name] MRN: [id number] Date of Birth: Mar 08, 1951 Referring Provider: Dr. Arbutus Leas   Encounter Date: 02/03/2018  PT End of Session - 02/03/18 1043    Visit Number  5 01/30/18 visit should have been visit 4    Number of Visits  13    Date for PT Re-Evaluation  03/09/18    Authorization Type  [employer]HC Medicare    PT Start Time  0849    PT Stop Time  0929    PT Time Calculation (min)  40 min    Activity Tolerance  Patient tolerated treatment well    Behavior During Therapy  [facility] for tasks assessed/performed       Past Medical History:  Diagnosis Date  . Allergy   . Asthma   . Parkinson's disease (HCC)    Congenital R fourth nerve palsy    Past Surgical History:  Procedure Laterality Date  . CATARACT EXTRACTION, BILATERAL    . COLONOSCOPY    . SHOULDER SURGERY Left    lymph nodes  . TONSILLECTOMY    . VASECTOMY      There were no vitals filed for this visit.  Subjective Assessment - 02/03/18 0850    Subjective  Have to take my medications at 9-still moving well today.  Interested in trying to work w/c for nighttime mobility.    Patient is accompained by:  Family member wife    Patient Stated Goals  Pt's goal for therapy is to trying to get better balance and eliminate the shuffling.    Currently in Pain?  No/denies                       [facility] Adult PT Treatment/Exercise - 02/03/18 0001      Ambulation/Gait   Ambulation/Gait  Yes    Ambulation/Gait Assistance  5: Supervision    Ambulation/Gait Assistance Details  Postural cues, cues for BIG, deliberate effort with movement patterns.    Ambulation Distance (Feet)  345 Feet x 2 , 100 ft   Assistive device  None    Gait Pattern  Step-through pattern;Narrow base of support    Ambulation Surface   Level;Indoor      Therapeutic Activites    Therapeutic Activities  Other Therapeutic Activities    Other Therapeutic Activities  Addressed pt's ability to negotiate himself in transport chair (which he uses at night bed<>bathroom due to significant freezing episodes and difficulty with mobility at this time of his medication cycle).  Simulated first with clinic w/c, with cues for "Kick and dig" to use BLEs in large, deliberate motions, to propel chair (after pt demonstrating, using small, quick BLE movements to attempt at chair propulsion).  Pt needs max verbal and demo cues for large effort, kicks, heel digs, then transitioned to use of stool, with pt able to perform Kick and Dig motion with greater ease, x 15 ft, then x 10 ft.  Initiated movement pattern with large effort seated marching and seated LAQ, cues to slow pace.        [facility]) - 02/03/18 0912    PWR! exercises  Moves in standing    PWR! Up  x 20 reps 2nd set on foam, x 10 reps    PWR! Rock  x 20 reps 2nd set on foam:  x  10 reps each side    PWR! Twist  x 20 reps 2nd set on foam:  x 10 reps each side    PWR Step  x 20 reps 2nd set on foam x 10 reps each side    Basic 4 Flow  x 4 reps standing on foam Verbal and tactile cues    Comments  Provided verbal cues for SLOWED, deliberate pace of exercises          PT Education - 02/03/18 1041    Education provided  Yes    Education Details  strategies for improved transport chair mobility at night (EXAGGERRATED "KICK" and "DIG" with feet; SLOWED pace for movement patterns and exercise    Person(s) Educated  Patient    Methods  Explanation;Demonstration;Handout    Comprehension  Verbalized understanding;Returned demonstration;Verbal cues required       PT Short Term Goals - 01/08/18 1750      PT SHORT TERM GOAL #1   Title  Pt will perform HEP with family's supervision, to address pt's Parkinson's related deficits.  TARGET 02/07/18    Time  4    Period  Weeks    Status  New     Target Date  02/07/18      PT SHORT TERM GOAL #2   Title  Pt will perform 5x sit<>stand without posterior lean/pull for improved transfer efficiency and safety.    Time  4    Period  Weeks    Status  New    Target Date  02/07/18      PT SHORT TERM GOAL #3   Title  Pt/wife will verbalize understanding of tips to reduce freezing episodes with gait and turns.    Time  4    Period  Weeks    Status  New    Target Date  02/07/18      PT SHORT TERM GOAL #4   Title  Pt will perform TUG and TUG cognitive without LOB during turns, 2 of 3 trials.    Time  4    Period  Weeks    Status  New    Target Date  02/07/18        PT Long Term Goals - 01/08/18 1757      PT LONG TERM GOAL #1   Title  Pt/wife will verbalize understanding of fall prevention in home environment.  TARGET4/26/19    Time  6    Period  Weeks    Status  New    Target Date  02/21/17      PT LONG TERM GOAL #2   Title  Pt will perform at least 8 of 10 reps of sit<>stand transfers from lower than 18" surfaces, no posterior lean or LOB.    Time  6    Period  Weeks    Status  New    Target Date  02/21/18      PT LONG TERM GOAL #3   Title  Pt will report at least 50% improvement in bed mobility.    Time  6    Period  Weeks    Status  New    Target Date  02/21/18      PT LONG TERM GOAL #4   Title  Pt/wife will report at least 50% improvement in transport wheelchair negotiation for night-time functional mobility.    Time  6    Period  Weeks    Status  New    Target Date  02/21/18      PT LONG TERM GOAL #5   Title  Pt/wife will verbalize strategies and cueing methods to assist in "off-time" in medication cycle for improved mobility.    Time  6    Period  Weeks    Status  New    Target Date  02/21/18            Plan - 02/03/18 1043    Clinical Impression Statement  Pt with improved ability this visit to perform movement patterns in more SLOW, deliberate, large amplitude manner.  He does need max  cueing to slow pace of foot movement with attempts to propel manual wheelchair and stool (to simulate transport chair mobility at home at night)    Rehab Potential  Good    PT Frequency  2x / week    PT Duration  6 weeks plus eval    PT Treatment/Interventions  ADLs/Self Care Home Management;DME Instruction;Gait training;Stair training;Functional mobility training;Therapeutic activities;Therapeutic exercise;Balance training;Patient/family education;Neuromuscular re-education    PT Next Visit Plan  If patient in "off-time" of medication cycle-will need to repeat funcitonal measures; review bed mobility/transport chair mobility as needed, transfers low surfaces, balance activities for turns, changes of direction    Consulted and Agree with Plan of Care  Patient;Family member/caregiver       Patient will benefit from skilled therapeutic intervention in order to improve the following deficits and impairments:  Abnormal gait, Decreased balance, Decreased safety awareness, Decreased mobility, Difficulty walking, Impaired tone, Postural dysfunction  Visit Diagnosis: Unsteadiness on feet  Other symptoms and signs involving the nervous system  Other abnormalities of gait and mobility     Problem List Patient Active Problem List   Diagnosis Date Noted  . Parkinson's disease (HCC) 05/04/2013    Braxtyn Bojarski W. 02/03/2018, 10:50 AM  Gean Maidens., PT  [facility] [address] [place], [place], [address] Phone: [phone]   Fax:  [phone]  Name: Zachary [name] MRN: [id number] Date of Birth: 1951/05/01

## 2018-02-06 ENCOUNTER — Ambulatory Visit: Payer: Medicare Other | Admitting: Physical Therapy

## 2018-02-06 ENCOUNTER — Encounter: Payer: Medicare Other | Admitting: Occupational Therapy

## 2018-02-11 ENCOUNTER — Encounter: Payer: Self-pay | Admitting: Physical Therapy

## 2018-02-11 ENCOUNTER — Ambulatory Visit: Payer: Medicare Other | Admitting: Physical Therapy

## 2018-02-11 ENCOUNTER — Ambulatory Visit: Payer: Medicare Other | Admitting: Occupational Therapy

## 2018-02-11 DIAGNOSIS — R29818 Other symptoms and signs involving the nervous system: Secondary | ICD-10-CM

## 2018-02-11 DIAGNOSIS — R2681 Unsteadiness on feet: Secondary | ICD-10-CM

## 2018-02-11 DIAGNOSIS — R278 Other lack of coordination: Secondary | ICD-10-CM

## 2018-02-11 DIAGNOSIS — R41844 Frontal lobe and executive function deficit: Secondary | ICD-10-CM

## 2018-02-12 NOTE — Therapy (Signed)
[place] [address] [place], [place], [address] Phone: [phone]   Fax:  [phone]  Physical Therapy Treatment  Patient Details  Name: Zachary [name] MRN: [id number] Date of Birth: 1951/06/26 Referring Provider: Dr. Carles Collet   Encounter Date: 02/11/2018  PT End of Session - 02/12/18 1007    Visit Number  6 01/30/18 visit should have been visit 4    Number of Visits  13    Date for PT Re-Evaluation  03/09/18    Authorization Type  [employer]HC Medicare    PT Start Time  0851    PT Stop Time  0929    PT Time Calculation (min)  38 min    Activity Tolerance  Patient tolerated treatment well    Behavior During Therapy  [facility] for tasks assessed/performed       Past Medical History:  Diagnosis Date  . Allergy   . Asthma   . Parkinson's disease ([place])    Congenital R fourth nerve palsy    Past Surgical History:  Procedure Laterality Date  . CATARACT EXTRACTION, BILATERAL    . COLONOSCOPY    . SHOULDER SURGERY Left    lymph nodes  . TONSILLECTOMY    . VASECTOMY      There were no vitals filed for this visit.  Subjective Assessment - 02/11/18 0852    Subjective  Really need to work on not being on my toes.    Patient is accompained by:  Family member wife    Patient Stated Goals  Pt's goal for therapy is to trying to get better balance and eliminate the shuffling.    Currently in Pain?  No/denies                       [facility] Adult PT Treatment/Exercise - 02/12/18 0830      Transfers   Transfers  Sit to Stand;Stand to Sit    Sit to Stand  6: Modified independent (Device/Increase time);Without upper extremity assist;From chair/3-in-1    Five time sit to stand comments   9.87 No evidence of posterior pull    Stand to Sit  6: Modified independent (Device/Increase time);Without upper extremity assist;To chair/3-in-1    Comments  Reviewed tips to reduce freezing with gait and turns:  pt reports stopping,  stretching, change of position; reminded patient of best posture, rocking to start and BIG, Deliberate effort with slower pace of movement (to avoid fast movements, running together)      Standardized Balance Assessment   Standardized Balance Assessment  Timed Up and Go Test      Timed Up and Go Test   TUG  Normal TUG;Cognitive TUG    Normal TUG (seconds)  9.34 10.5, 10 sec    Cognitive TUG (seconds)  10.09 9.44, 9.16      Therapeutic Activites    Therapeutic Activities  Other Therapeutic Activities    Other Therapeutic Activities  Pt performs seated rolling task on rolling stool, using "kick and dig" method, simulating transport chair at night, for improved transport chair mobility, 60 ft x 2        [facility]) - 02/12/18 0830    PWR! exercises  Moves in standing;Moves in sitting    PWR! Up  x 20    PWR! Rock  x 20    PWR! Twist  x 20    PWR Step  x 20    Comments  Cues for slowed, deliberate  pace of movement pattern    PWR! Up  x 20    PWR! Rock  x 20    PWR! Twist  x 20     PWR! Step  x 20    Comments  Seated PWR! Moves:  cues for slowed, deliberate pace of movements       Also performed sit to stand with PWR! Up x 5, Sit to stand with PWR! Rock x 5, then [employer]! Twist x 5 reps each side, then PWR! Step x 5 reps each side, all while holding 2.2 lb weighted ball for improved intensity of large amplitude activities.   PT Education - 02/12/18 1003    Education provided  Yes    Education Details  formally gave patient/reviewed seated and standing PWR! Moves as part of HEP    Person(s) Educated  Patient    Methods  Explanation;Demonstration;Handout    Comprehension  Verbalized understanding;Returned demonstration;Verbal cues required       PT Short Term Goals - 02/11/18 0903      PT SHORT TERM GOAL #1   Title  Pt will perform HEP with family's supervision, to address pt's Parkinson's related deficits.  TARGET 02/07/18    Time  4    Period  Weeks    Status  Achieved       PT SHORT TERM GOAL #2   Title  Pt will perform 5x sit<>stand without posterior lean/pull for improved transfer efficiency and safety.    Time  4    Period  Weeks    Status  Achieved      PT SHORT TERM GOAL #3   Title  Pt/wife will verbalize understanding of tips to reduce freezing episodes with gait and turns.    Time  4    Period  Weeks    Status  Achieved      PT SHORT TERM GOAL #4   Title  Pt will perform TUG and TUG cognitive without LOB during turns, 2 of 3 trials.    Time  4    Period  Weeks    Status  Achieved        PT Long Term Goals - 01/08/18 1757      PT LONG TERM GOAL #1   Title  Pt/wife will verbalize understanding of fall prevention in home environment.  TARGET4/26/19    Time  6    Period  Weeks    Status  New    Target Date  02/21/17      PT LONG TERM GOAL #2   Title  Pt will perform at least 8 of 10 reps of sit<>stand transfers from lower than 18" surfaces, no posterior lean or LOB.    Time  6    Period  Weeks    Status  New    Target Date  02/21/18      PT LONG TERM GOAL #3   Title  Pt will report at least 50% improvement in bed mobility.    Time  6    Period  Weeks    Status  New    Target Date  02/21/18      PT LONG TERM GOAL #4   Title  Pt/wife will report at least 50% improvement in transport wheelchair negotiation for night-time functional mobility.    Time  6    Period  Weeks    Status  New    Target Date  02/21/18      PT LONG TERM  GOAL #5   Title  Pt/wife will verbalize strategies and cueing methods to assist in "off-time" in medication cycle for improved mobility.    Time  6    Period  Weeks    Status  New    Target Date  02/21/18            Plan - 02/12/18 1007    Clinical Impression Statement  Assessed pt's STGs and pt has met all STGs.  Pt appears to have improved movement patterns with TUG and sit to stand activities.  He continues to need cueing at times to slow pace of movement patterns, but overall may be  demonstrating improved awareness of movement patterns.  Unsure about carryover for "off times", as PT has only seen him in "on" times.  Pt will continue to beneift from skilled PT to address functional mobility and balance towards LTGs.    Rehab Potential  Good    PT Frequency  2x / week    PT Duration  6 weeks plus eval    PT Treatment/Interventions  ADLs/Self Care Home Management;DME Instruction;Gait training;Stair training;Functional mobility training;Therapeutic activities;Therapeutic exercise;Balance training;Patient/family education;Neuromuscular re-education    PT Next Visit Plan  If patient in "off-time" of medication cycle-will need to repeat funcitonal measures;  transfers low surfaces, balance activities for turns, changes of direction, compliant surfaces    Consulted and Agree with Plan of Care  Patient;Family member/caregiver       Patient will benefit from skilled therapeutic intervention in order to improve the following deficits and impairments:  Abnormal gait, Decreased balance, Decreased safety awareness, Decreased mobility, Difficulty walking, Impaired tone, Postural dysfunction  Visit Diagnosis: Unsteadiness on feet  Other symptoms and signs involving the nervous system     Problem List Patient Active Problem List   Diagnosis Date Noted  . Parkinson's disease ([place]) 05/04/2013    Wasyl Dornfeld W. 02/12/2018, 10:12 AM  Frazier Butt., PT   [place] [address] [place], [place], [address] Phone: [phone]   Fax:  [phone]  Name: ESAIAS CLEAVENGER MRN: [id number] Date of Birth: 16-Mar-1951

## 2018-02-12 NOTE — Therapy (Signed)
[facility] [address] [place], [place], [address] Phone: [phone]   Fax:  [phone]  Occupational Therapy Treatment  Patient Details  Name: Zachary [name] MRN: [id number] Date of Birth: 08-26-1951 Referring Provider: Dr. Arbutus Leasat   Encounter Date: 02/11/2018  OT End of Session - 02/11/18 0841    Visit Number  6    Number of Visits  17    Date for OT Re-Evaluation  03/09/18    Authorization Type  UHC Medicare    OT Start Time  0804    OT Stop Time  0845    OT Time Calculation (min)  41 min       Past Medical History:  Diagnosis Date  . Allergy   . Asthma   . Parkinson's disease (HCC)    Congenital R fourth nerve palsy    Past Surgical History:  Procedure Laterality Date  . CATARACT EXTRACTION, BILATERAL    . COLONOSCOPY    . SHOULDER SURGERY Left    lymph nodes  . TONSILLECTOMY    . VASECTOMY      There were no vitals filed for this visit.  Subjective Assessment - 02/12/18 0829    Pertinent History  PD diagnosed 2000, asthma    Patient Stated Goals  improve perfromance of daily activities    Currently in Pain?  No/denies            Treatment: arm bike x 5 mins level 1 for conditioning, min v.c for speed, pt maintained between 40-55 rpm. Dynamic step and reach, over target for bigger steps, while copying small peg design for fine motor coordination with a cognitive component with right and left UE's , min v.c for correct design.  Removing pegs with in hand manipulation, min v.c Reviewed handwriting strategies and practice, pt demonstrates ability to write a short paragraph with 100% legibility, and min decrease in letter size.                  OT Short Term Goals - 02/11/18 [id number]      OT SHORT TERM GOAL #1   Title  I with PD specific HEP-02/07/18    Status  On-going      OT SHORT TERM GOAL #2   Title  Pt will verbalize understanding of adapted strategies for ADLS/IADLS in  order to maximize safety and independence.    Status  On-going      OT SHORT TERM GOAL #3   Title  Pt will demonstrate improved independence with dressing as evidenced by decreasing 3 button/ unbutton time to 2 mins or less.    Status  On-going      OT SHORT TERM GOAL #4   Title  Pt will demonstrate increased ease with dressing as evidenced by decreasing PPT #4 by 3 secs.    Status  On-going      OT SHORT TERM GOAL #5   Title  Pt will demonstrate improved LUE functional reach as evidenced by retrieving a lightweight object at 125 shoulder flexion with -10 elbow ext.    Status  On-going      OT SHORT TERM GOAL #6   Title  Pt will demonstrate ability to write a short paragraph with 100% legibility and minimal decrease in letter size.    Status  Achieved        OT Long Term Goals - 01/08/18 1003      OT LONG TERM GOAL #1   Title  Pt will verbalize understanding of community resources and ways to prevent future PD related complications.    Time  8    Period  Weeks    Status  New    Target Date  03/09/18      OT LONG TERM GOAL #2   Title  Pt will verbalize understanding of compensatory strategies for short term memory deficits and ways to keep thinking skills sharp.    Time  8    Period  Weeks    Status  New      OT LONG TERM GOAL #3   Title  Pt will demonstrate improved ease with feeding as evidenced by decreasing PPT#2 simulated feeding to 15 secs or less    Baseline  17.78 secs    Time  8    Period  Weeks    Status  New      OT LONG TERM GOAL #4   Title  Pt will demonstrate improved fine motor coordination for ADLS as evidenced by decreasing LUE 9 hole peg test score by 3 secs.    Baseline  RUE 35.94 secs, LUE 38.06 secs    Time  8    Period  Weeks    Status  New            Plan - 02/12/18 0831    Clinical Impression Statement  Pt is progressing towards goals. He demonstrates improved letter size and legibility of handwriting when he slows down.     Occupational Profile and client history currently impacting functional performance  PMH: PD diagnosed in 2000, asthma. 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 Pt participates in PWR! moves    Occupational performance deficits (Please refer to evaluation for details):  IADL's;ADL's;Rest and Sleep;Leisure;Social Participation    Rehab Potential  Good    Current Impairments/barriers affecting progress:  cognitive deficits, visual perceptual deficits, length of time since inital onset    OT Frequency  2x / week    OT Duration  8 weeks    OT Treatment/Interventions  Self-care/ADL training;Therapeutic exercise;Visual/perceptual remediation/compensation;Patient/family education;Gait Training;Neuromuscular education;Moist Heat;Fluidtherapy;Energy conservation;Building services engineer;Therapeutic activities;Cognitive remediation/compensation;Passive range of motion;Manual Therapy;DME and/or AE instruction;Ultrasound;Cryotherapy    Plan  check short term goals    Consulted and Agree with Plan of Care  Patient       Patient will benefit from skilled therapeutic intervention in order to improve the following deficits and impairments:  Abnormal gait, Decreased knowledge of use of DME, Impaired flexibility, Impaired vision/preception, Pain, Decreased mobility, Decreased coordination, Decreased activity tolerance, Decreased endurance, Decreased range of motion, Decreased strength, Impaired tone, Impaired UE functional use, Impaired perceived functional ability, Difficulty walking, Decreased safety awareness, Decreased knowledge of precautions, Decreased balance  Visit Diagnosis: Other lack of coordination  Other symptoms and signs involving the nervous system  Unsteadiness on feet  Frontal lobe and executive function deficit    Problem List Patient Active Problem List   Diagnosis Date Noted  . Parkinson's disease (HCC) 05/04/2013    Zachary [name] 02/12/2018, 8:35 AM .Keene Breath, OTR/L [phone] Phone: [phone] 8:40 AM 02/12/18 [facility] [address] [place], [place], [address] Phone: [phone]   Fax:  [phone]  Name: Zachary [name] MRN: [id number] Date of Birth: [birth year]

## 2018-02-13 ENCOUNTER — Ambulatory Visit: Payer: Medicare Other | Admitting: Physical Therapy

## 2018-02-13 ENCOUNTER — Encounter: Payer: Self-pay | Admitting: Physical Therapy

## 2018-02-13 ENCOUNTER — Ambulatory Visit: Payer: Medicare Other | Admitting: Occupational Therapy

## 2018-02-13 DIAGNOSIS — R41844 Frontal lobe and executive function deficit: Secondary | ICD-10-CM

## 2018-02-13 DIAGNOSIS — R2681 Unsteadiness on feet: Secondary | ICD-10-CM

## 2018-02-13 DIAGNOSIS — R29818 Other symptoms and signs involving the nervous system: Secondary | ICD-10-CM

## 2018-02-13 DIAGNOSIS — R2689 Other abnormalities of gait and mobility: Secondary | ICD-10-CM

## 2018-02-13 DIAGNOSIS — R278 Other lack of coordination: Secondary | ICD-10-CM

## 2018-02-13 NOTE — Therapy (Signed)
[facility] [address] [place], [place], [address] Phone: [phone]   Fax:  [phone]  Occupational Therapy Treatment  Patient Details  Name: Zachary [name] MRN: [id number] Date of Birth: Apr 24, 1951 Referring Provider: Dr. Arbutus Leas   Encounter Date: 02/13/2018  OT End of Session - 02/13/18 0808    Visit Number  7    Number of Visits  17    Date for OT Re-Evaluation  03/09/18    Authorization Type  UHC Medicare    OT Start Time  0804    OT Stop Time  0845    OT Time Calculation (min)  41 min    Activity Tolerance  Patient tolerated treatment well    Behavior During Therapy  [facility] for tasks assessed/performed       Past Medical History:  Diagnosis Date  . Allergy   . Asthma   . Parkinson's disease (HCC)    Congenital R fourth nerve palsy    Past Surgical History:  Procedure Laterality Date  . CATARACT EXTRACTION, BILATERAL    . COLONOSCOPY    . SHOULDER SURGERY Left    lymph nodes  . TONSILLECTOMY    . VASECTOMY      There were no vitals filed for this visit.  Subjective Assessment - 02/13/18 0805    Subjective   Pt denies pain    Patient Stated Goals  improve performance of daily activities    Currently in Pain?  No/denies              Treatment: Arm bike x 5 mins level 1 for conditioning, pt maintained 40-54 rpm Therapist reviewed strategies for fastening buttons and donning jacket, pt returned demonstration following min-mod v.c Dynamic step and reach to toss scarves to targets with min-mod v.c for larger amplitude movements. Therapist checked progress towards short term goals, see goals. PWR! Basic 4 x 10 reps each, min v.c for positioning/ larger amplitude movements               OT Short Term Goals - 02/13/18 0810      OT SHORT TERM GOAL #1   Title  I with PD specific HEP-02/07/18    Status  On-going      OT SHORT TERM GOAL #2   Title  Pt will verbalize understanding  of adapted strategies for ADLS/IADLS in order to maximize safety and independence.    Status  On-going needs continued reinforcement      OT SHORT TERM GOAL #3   Title  Pt will demonstrate improved independence with dressing as evidenced by decreasing 3 button/ unbutton time to 2 mins or less.    Status  Achieved 1 min 50 secs      OT SHORT TERM GOAL #4   Title  Pt will demonstrate increased ease with dressing as evidenced by decreasing PPT #4 by 3 secs.    Status  Achieved 11.59 secs      OT SHORT TERM GOAL #5   Title  Pt will demonstrate improved LUE functional reach as evidenced by retrieving a lightweight object at 125 shoulder flexion with -10 elbow ext.    Status  Achieved 135, -5      OT SHORT TERM GOAL #6   Title  Pt will demonstrate ability to write a short paragraph with 100% legibility and minimal decrease in letter size.    Status  Achieved        OT Long Term Goals - 01/08/18  1003      OT LONG TERM GOAL #1   Title  Pt will verbalize understanding of community resources and ways to prevent future PD related complications.    Time  8    Period  Weeks    Status  New    Target Date  03/09/18      OT LONG TERM GOAL #2   Title  Pt will verbalize understanding of compensatory strategies for short term memory deficits and ways to keep thinking skills sharp.    Time  8    Period  Weeks    Status  New      OT LONG TERM GOAL #3   Title  Pt will demonstrate improved ease with feeding as evidenced by decreasing PPT#2 simulated feeding to 15 secs or less    Baseline  17.78 secs    Time  8    Period  Weeks    Status  New      OT LONG TERM GOAL #4   Title  Pt will demonstrate improved fine motor coordination for ADLS as evidenced by decreasing LUE 9 hole peg test score by 3 secs.    Baseline  RUE 35.94 secs, LUE 38.06 secs    Time  8    Period  Weeks    Status  New            Plan - 02/13/18 [id number]    Clinical Impression Statement  Pt is progressing towards  goals. See short term goals for updates.    Occupational performance deficits (Please refer to evaluation for details):  IADL's;ADL's;Rest and Sleep;Leisure;Social Participation    Current Impairments/barriers affecting progress:  cognitive deficits, visual perceptual deficits, length of time since inital onset    OT Frequency  2x / week    OT Duration  8 weeks    OT Treatment/Interventions  Self-care/ADL training;Therapeutic exercise;Visual/perceptual remediation/compensation;Patient/family education;Gait Training;Neuromuscular education;Moist Heat;Fluidtherapy;Energy conservation;Building services engineerunctional Mobility Training;Therapeutic activities;Cognitive remediation/compensation;Passive range of motion;Manual Therapy;DME and/or AE instruction;Ultrasound;Cryotherapy    Plan  continue to work towards long term goals    Consulted and Agree with Plan of Care  Patient       Patient will benefit from skilled therapeutic intervention in order to improve the following deficits and impairments:  Abnormal gait, Decreased knowledge of use of DME, Impaired flexibility, Impaired vision/preception, Pain, Decreased mobility, Decreased coordination, Decreased activity tolerance, Decreased endurance, Decreased range of motion, Decreased strength, Impaired tone, Impaired UE functional use, Impaired perceived functional ability, Difficulty walking, Decreased safety awareness, Decreased knowledge of precautions, Decreased balance  Visit Diagnosis: Other lack of coordination  Other symptoms and signs involving the nervous system  Unsteadiness on feet  Frontal lobe and executive function deficit  Other abnormalities of gait and mobility    Problem List Patient Active Problem List   Diagnosis Date Noted  . Parkinson's disease (HCC) 05/04/2013    Chanda Laperle 02/13/2018, 12:41 PM Keene BreathKathryn Jacion Dismore, OTR/L [phone] Phone: [phone] 12:44 PM 02/13/18 [facility] [address] [place], [place], [address] Phone: [phone]   Fax:  [phone]  Name: Gustavo LahStuart D Rodd MRN: [id number] Date of Birth: 07-Jan-1951

## 2018-02-13 NOTE — Therapy (Signed)
[facility] [address] [place], [place], [address] Phone: [phone]   Fax:  [phone]  Physical Therapy Treatment  Patient Details  Name: Zachary [name] MRN: [id number] Date of Birth: 1951-09-28 Referring Provider: Dr. Arbutus Leas   Encounter Date: 02/13/2018  PT End of Session - 02/13/18 2023    Visit Number  7    Number of Visits  13    Date for PT Re-Evaluation  03/09/18    Authorization Type  UHC Medicare    PT Start Time  0848    PT Stop Time  0930    PT Time Calculation (min)  42 min    Activity Tolerance  Patient tolerated treatment well    Behavior During Therapy  [facility] for tasks assessed/performed       Past Medical History:  Diagnosis Date  . Allergy   . Asthma   . Parkinson's disease (HCC)    Congenital R fourth nerve palsy    Past Surgical History:  Procedure Laterality Date  . CATARACT EXTRACTION, BILATERAL    . COLONOSCOPY    . SHOULDER SURGERY Left    lymph nodes  . TONSILLECTOMY    . VASECTOMY      There were no vitals filed for this visit.  Subjective Assessment - 02/13/18 0851    Subjective  I may have some down time today between 9:00 and 9:30 (due to his medication being due at 9:00).     Patient is accompained by:  Family member wife    Patient Stated Goals  Pt's goal for therapy is to trying to get better balance and eliminate the shuffling.    Currently in Pain?  No/denies                       [facility] Adult PT Treatment/Exercise - 02/13/18 0857      Transfers   Sit to Stand  6: Modified independent (Device/Increase time);Without upper extremity assist;From chair/3-in-1    Stand to Sit  6: Modified independent (Device/Increase time)    Comments  14" height x 10; 13" x 10      Ambulation/Gait   Ambulation/Gait Assistance  5: Supervision    Ambulation/Gait Assistance Details  vc for incr step length, heelstrike and arm swing; all improved after pre-gait activities     Ambulation Distance (Feet)  800 Feet    Assistive device  None    Gait Pattern  Step-through pattern;Narrow base of support;Decreased arm swing - right;Decreased arm swing - left;Decreased step length - right;Decreased step length - left    Ambulation Surface  Level;Indoor    Pre-Gait Activities  slow, exaggerated (BIG) walking focusing on incr amplitude of UE swing and step length x 40 ft x 4 reps        [facility]) - 02/13/18 2018    PWR! Up  x 20     PWR! Rock  x 20    PWR! Twist  x20    PWR Step  x20     Basic 4 Flow  x 10    Comments  vc for slower movements with focus on end-range extension/full ROM (especially fingers, elbows), wider stance with proper weight-shifting, and following UE movements with head/eyes       Balance Exercises - 02/13/18 2021      Balance Exercises: Standing   Standing Eyes Opened  Narrow base of support (BOS);Head turns;Foam/compliant surface    Standing Eyes Closed  Narrow  base of support (BOS);Head turns;Foam/compliant surface    Tandem Stance  Eyes open;Foam/compliant surface    Standing, One Foot on a Step  Eyes open;Foam/compliant surface;6 inch;15 secs 3 reps each leg; raised foot on upright yoga block          PT Short Term Goals - 02/11/18 0903      PT SHORT TERM GOAL #1   Title  Pt will perform HEP with family's supervision, to address pt's Parkinson's related deficits.  TARGET 02/07/18    Time  4    Period  Weeks    Status  Achieved      PT SHORT TERM GOAL #2   Title  Pt will perform 5x sit<>stand without posterior lean/pull for improved transfer efficiency and safety.    Time  4    Period  Weeks    Status  Achieved      PT SHORT TERM GOAL #3   Title  Pt/wife will verbalize understanding of tips to reduce freezing episodes with gait and turns.    Time  4    Period  Weeks    Status  Achieved      PT SHORT TERM GOAL #4   Title  Pt will perform TUG and TUG cognitive without LOB during turns, 2 of 3 trials.    Time  4     Period  Weeks    Status  Achieved        PT Long Term Goals - 01/08/18 1757      PT LONG TERM GOAL #1   Title  Pt/wife will verbalize understanding of fall prevention in home environment.  TARGET4/26/19    Time  6    Period  Weeks    Status  New    Target Date  02/21/17      PT LONG TERM GOAL #2   Title  Pt will perform at least 8 of 10 reps of sit<>stand transfers from lower than 18" surfaces, no posterior lean or LOB.    Time  6    Period  Weeks    Status  New    Target Date  02/21/18      PT LONG TERM GOAL #3   Title  Pt will report at least 50% improvement in bed mobility.    Time  6    Period  Weeks    Status  New    Target Date  02/21/18      PT LONG TERM GOAL #4   Title  Pt/wife will report at least 50% improvement in transport wheelchair negotiation for night-time functional mobility.    Time  6    Period  Weeks    Status  New    Target Date  02/21/18      PT LONG TERM GOAL #5   Title  Pt/wife will verbalize strategies and cueing methods to assist in "off-time" in medication cycle for improved mobility.    Time  6    Period  Weeks    Status  New    Target Date  02/21/18            Plan - 02/13/18 2027    Clinical Impression Statement  Session focused on use of exercise to promote larger movement patterns with full ROM for carryover into improved efficiency and balance during gait and functional mobility. Patient initially required frequent vc for slower movements for improved control/balance however demonstrated ability to maintain improvements with fewer cues as session progressed.  Patient reports improvement in his mobility and can continue to benefit from PT as he works toward achieving LTGs.     Rehab Potential  Good    PT Frequency  2x / week    PT Duration  6 weeks plus eval    PT Treatment/Interventions  ADLs/Self Care Home Management;DME Instruction;Gait training;Stair training;Functional mobility training;Therapeutic activities;Therapeutic  exercise;Balance training;Patient/family education;Neuromuscular re-education    PT Next Visit Plan  If patient in "off-time" of medication cycle-will need to repeat funcitonal measures;  transfers low surfaces, balance activities for turns, changes of direction, compliant surfaces    Consulted and Agree with Plan of Care  Patient       Patient will benefit from skilled therapeutic intervention in order to improve the following deficits and impairments:  Abnormal gait, Decreased balance, Decreased safety awareness, Decreased mobility, Difficulty walking, Impaired tone, Postural dysfunction  Visit Diagnosis: Other symptoms and signs involving the nervous system  Unsteadiness on feet  Other abnormalities of gait and mobility     Problem List Patient Active Problem List   Diagnosis Date Noted  . Parkinson's disease (HCC) 05/04/2013    Zena AmosLynn P Eliyahu Bille , PT 02/13/2018, 8:39 PM  [facility] [address] [place], [place], [address] Phone: [phone]   Fax:  [phone]  Name: Zachary [name] MRN: [id number] Date of Birth: 1951/08/04

## 2018-02-18 ENCOUNTER — Encounter: Payer: Self-pay | Admitting: Physical Therapy

## 2018-02-18 ENCOUNTER — Ambulatory Visit: Payer: Medicare Other | Admitting: Physical Therapy

## 2018-02-18 ENCOUNTER — Ambulatory Visit: Payer: Medicare Other | Admitting: Occupational Therapy

## 2018-02-18 ENCOUNTER — Encounter: Payer: Self-pay | Admitting: Occupational Therapy

## 2018-02-18 DIAGNOSIS — R41844 Frontal lobe and executive function deficit: Secondary | ICD-10-CM

## 2018-02-18 DIAGNOSIS — R29818 Other symptoms and signs involving the nervous system: Secondary | ICD-10-CM

## 2018-02-18 DIAGNOSIS — R278 Other lack of coordination: Secondary | ICD-10-CM

## 2018-02-18 DIAGNOSIS — R29898 Other symptoms and signs involving the musculoskeletal system: Secondary | ICD-10-CM

## 2018-02-18 DIAGNOSIS — R2681 Unsteadiness on feet: Secondary | ICD-10-CM | POA: Diagnosis not present

## 2018-02-18 NOTE — Patient Instructions (Addendum)
Memory Compensation Strategies  1. Use "WARM" strategy.  W= write it down  A= associate it  R= repeat it  M= make a mental note  2.   You can keep a Memory Notebook.  Use a 3-ring notebook with sections for the following: calendar, important names and phone numbers,  medications, doctors' names/phone numbers, lists/reminders, and a section to journal what you did  each day.   3.    Use a calendar to write appointments down.  4.    Write yourself a schedule for the day.  This can be placed on the calendar or in a separate section of the Memory Notebook.  Keeping a  regular schedule can help memory.  5.    Use medication organizer with sections for each day or morning/evening pills.  You may need help loading it  6.    Keep a basket, or pegboard by the door.  Place items that you need to take out with you in the basket or on the pegboard.  You may also want to  include a message board for reminders.  7.    Use sticky notes.  Place sticky notes with reminders in a place where the task is performed.  For example: " turn off the  stove" placed by the stove, "lock the door" placed on the door at eye level, " take your medications" on  the bathroom mirror or by the place where you normally take your medications.  8.    Use alarms/timers.  Use while cooking to remind yourself to check on food or as a reminder to take your medicine, or as a  reminder to make a call, or as a reminder to perform another task, etc.     Keeping Thinking Skills Sharp: 1. Jigsaw puzzles 2. Card/board games 3. Talking on the phone/social events 4. Lum[employer].com 5. Online games 6. Word serches/crossword puzzles 7.  Logic puzzles 8. Aerobic exercise (stationary bike) 9. Eating balanced diet (fruits & veggies) 10. Drink water 11. Try something new--new recipe, hobby 12. Crafts 13. Do a variety of activities that are challenging  

## 2018-02-18 NOTE — Therapy (Signed)
[facility] [address] [place], [place], [address] Phone: [phone]   Fax:  [phone]  Occupational Therapy Treatment  Patient Details  Name: Zachary [name] MRN: [id number] Date of Birth: [birth year] Referring Provider: Dr. Arbutus Leas   Encounter Date: 02/18/2018  OT End of Session - 02/18/18 0842    Visit Number  8    Number of Visits  17    Date for OT Re-Evaluation  03/09/18    Authorization Type  [employer] Medicare    OT Start Time  0804    OT Stop Time  0845    OT Time Calculation (min)  41 min    Activity Tolerance  Patient tolerated treatment well    Behavior During Therapy  [facility] for tasks assessed/performed       Past Medical History:  Diagnosis Date  . Allergy   . Asthma   . Parkinson's disease (HCC)    Congenital R fourth nerve palsy    Past Surgical History:  Procedure Laterality Date  . CATARACT EXTRACTION, BILATERAL    . COLONOSCOPY    . SHOULDER SURGERY Left    lymph nodes  . TONSILLECTOMY    . VASECTOMY      There were no vitals filed for this visit.  Subjective Assessment - 02/18/18 0840    Subjective   Pt denies pain    Patient Stated Goals  improve performance of daily activities    Currently in Pain?  No/denies           Treatment: Arm bike x 5 mins level 4 for conditioning min v.c for speed, pt maintained 45-55 rpm Dual tasking to ambulate, toss scarf and perform category generation, mod v.c to slow down, to generate words and for larger movements Fine motor coordination activities, placing grooved pegs into pegboard with right and left hands then removing with in hand manipulation, min difficulty/ v.c Picking up and stacking coins, min v.c for techniques and larger movements.                OT Education - 02/18/18 1339    Education provided  Yes    Education Details  memory compensations, keeping thinking skills sharp    Person(s) Educated  Patient    Methods   Explanation;Demonstration;Verbal cues;Handout    Comprehension  Verbalized understanding;Returned demonstration;Verbal cues required       OT Short Term Goals - 02/13/18 0810      OT SHORT TERM GOAL #1   Title  I with PD specific HEP-02/07/18    Status  On-going      OT SHORT TERM GOAL #2   Title  Pt will verbalize understanding of adapted strategies for ADLS/IADLS in order to maximize safety and independence.    Status  On-going needs continued reinforcement      OT SHORT TERM GOAL #3   Title  Pt will demonstrate improved independence with dressing as evidenced by decreasing 3 button/ unbutton time to 2 mins or less.    Status  Achieved 1 min 50 secs      OT SHORT TERM GOAL #4   Title  Pt will demonstrate increased ease with dressing as evidenced by decreasing PPT #4 by 3 secs.    Status  Achieved 11.59 secs      OT SHORT TERM GOAL #5   Title  Pt will demonstrate improved LUE functional reach as evidenced by retrieving a lightweight object at 125 shoulder flexion with -10 elbow ext.  Status  Achieved 135, -5      OT SHORT TERM GOAL #6   Title  Pt will demonstrate ability to write a short paragraph with 100% legibility and minimal decrease in letter size.    Status  Achieved        OT Long Term Goals - 01/08/18 1003      OT LONG TERM GOAL #1   Title  Pt will verbalize understanding of community resources and ways to prevent future PD related complications.    Time  8    Period  Weeks    Status  New    Target Date  03/09/18      OT LONG TERM GOAL #2   Title  Pt will verbalize understanding of compensatory strategies for short term memory deficits and ways to keep thinking skills sharp.    Time  8    Period  Weeks    Status  New      OT LONG TERM GOAL #3   Title  Pt will demonstrate improved ease with feeding as evidenced by decreasing PPT#2 simulated feeding to 15 secs or less    Baseline  17.78 secs    Time  8    Period  Weeks    Status  New      OT LONG TERM  GOAL #4   Title  Pt will demonstrate improved fine motor coordination for ADLS as evidenced by decreasing LUE 9 hole peg test score by 3 secs.    Baseline  RUE 35.94 secs, LUE 38.06 secs    Time  8    Period  Weeks    Status  New            Plan - 02/18/18 5621    Clinical Impression Statement  Pt is progressing towards goals. He continues to require v.c to slow down and for larger amplitude movements with activity.    Occupational Profile and client history currently impacting functional performance  PMH: PD diagnosed in 2000, asthma. 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 Pt participates in P[employer]! moves    Occupational performance deficits (Please refer to evaluation for details):  IADL's;ADL's;Rest and Sleep;Leisure;Social Participation    Rehab Potential  Good    Current Impairments/barriers affecting progress:  cognitive deficits, visual perceptual deficits, length of time since inital onset    OT Frequency  2x / week    OT Duration  8 weeks    OT Treatment/Interventions  Self-care/ADL training;Therapeutic exercise;Visual/perceptual remediation/compensation;Patient/family education;Gait Training;Neuromuscular education;Moist Heat;Fluidtherapy;Energy conservation;Building services engineer;Therapeutic activities;Cognitive remediation/compensation;Passive range of motion;Manual Therapy;DME and/or AE instruction;Ultrasound;Cryotherapy    Plan  continue to work towards long term goals    Consulted and Agree with Plan of Care  Patient       Patient will benefit from skilled therapeutic intervention in order to improve the following deficits and impairments:     Visit Diagnosis: Other lack of coordination  Other symptoms and signs involving the nervous system  Unsteadiness on feet  Frontal lobe and executive function deficit  Other symptoms and signs involving the musculoskeletal system    Problem List Patient Active Problem List   Diagnosis Date  Noted  . Parkinson's disease (HCC) 05/04/2013    Zachary [name] 02/18/2018, 1:42 PM Zachary [name], OTR/L [phone] Phone: [phone] 1:43 PM 02/18/18 [facility] [address] [place], [place], [address] Phone: [phone]   Fax:  [phone]  Name: KYSEAN SWEET MRN:  [id number] Date of Birth: 1951/01/22

## 2018-02-18 NOTE — Therapy (Signed)
[facility] [address] [place], [place], [address] Phone: [phone]   Fax:  [phone]  Physical Therapy Treatment  Patient Details  Name: Zachary [name] MRN: [id number] Date of Birth: 01/11/1951 Referring Provider: Dr. Arbutus Leas   Encounter Date: 02/18/2018  PT End of Session - 02/18/18 1138    Visit Number  8    Number of Visits  13    Date for PT Re-Evaluation  03/09/18    Authorization Type  [employer] Medicare    PT Start Time  0850    PT Stop Time  0931    PT Time Calculation (min)  41 min    Activity Tolerance  Patient tolerated treatment well    Behavior During Therapy  [facility] for tasks assessed/performed       Past Medical History:  Diagnosis Date  . Allergy   . Asthma   . Parkinson's disease (HCC)    Congenital R fourth nerve palsy    Past Surgical History:  Procedure Laterality Date  . CATARACT EXTRACTION, BILATERAL    . COLONOSCOPY    . SHOULDER SURGERY Left    lymph nodes  . TONSILLECTOMY    . VASECTOMY      There were no vitals filed for this visit.  Subjective Assessment - 02/18/18 0851    Subjective  Had to have a root canal yesterday.  Been walking some over the weekend with my wife.  No falls.    Patient is accompained by:  Family member wife    Patient Stated Goals  Pt's goal for therapy is to trying to get better balance and eliminate the shuffling.    Currently in Pain?  No/denies                       [facility] Adult PT Treatment/Exercise - 02/18/18 0001      Transfers   Transfers  Sit to Stand;Stand to Sit    Sit to Stand  6: Modified independent (Device/Increase time);Without upper extremity assist;From bed    Stand to Sit  6: Modified independent (Device/Increase time);To bed;Without upper extremity assist    Number of Reps  10 reps      High Level Balance   High Level Balance Comments  Gait with quick stop/starts, with quick change of directions, turns       Therapeutic Activites    Other Therapeutic Activities  Performed bed mobility sequence, 3 reps, with PT providing cues for BIG effort to initiate supine>sit with improved momentum.  Then proceeded to perform seated "kick and dig" on stool, simulating transport chair for nighttime, 20 ft x 2.  Cues to STOP quick, small feet movements and start again BIG and deliberate.        [facility]) - 02/18/18 6962    PWR! exercises  Moves in standing    PWR! Up  x 20    PWR! Rock  x 20    PWR! Twist  x 20    PWR Step  x 20    Comments  VCs for slower, deliberate movement patterns, deliberate hold at end range to emphasize posture, weightshift, trunk rotation, initial step       Balance Exercises - 02/18/18 1132      Balance Exercises: Standing   Stepping Strategy  Anterior;Posterior;Lateral;10 reps 2 sets, tactile and verbal cues for slowed, deliberate moves    Gait with Head Turns  Forward    Other Standing Exercises  Pt has difficulty with posterior step and rock.  Pt experiences freezing/festinating episodes during PT session; PT provides max verbal and tactile cues for techniques/strategies to reduce freezing episodes.  Pt requests several times to "just let me walk it off."      -Marching in place, 2 sets x 10, with cues for increased intensity of movement to improve step height and widened BOS -stagger stance rocking forward and back 5-10 reps with manual cues at pelvis -heel/toe raises 5-10 reps, with pt having difficulty with toe raises, cues for patient to hold UE support and to slow movement patterns.   PT Education - 02/18/18 1137    Education provided  Yes    Education Details  REviewed/ made connections with standing PWR! Moves to functional patterns to reduce freezing episodes with gait.    Person(s) Educated  Patient    Methods  Explanation;Demonstration;Verbal cues    Comprehension  Verbalized understanding;Returned demonstration;Verbal cues required;Need further instruction        PT Short Term Goals - 02/11/18 0903      PT SHORT TERM GOAL #1   Title  Pt will perform HEP with family's supervision, to address pt's Parkinson's related deficits.  TARGET 02/07/18    Time  4    Period  Weeks    Status  Achieved      PT SHORT TERM GOAL #2   Title  Pt will perform 5x sit<>stand without posterior lean/pull for improved transfer efficiency and safety.    Time  4    Period  Weeks    Status  Achieved      PT SHORT TERM GOAL #3   Title  Pt/wife will verbalize understanding of tips to reduce freezing episodes with gait and turns.    Time  4    Period  Weeks    Status  Achieved      PT SHORT TERM GOAL #4   Title  Pt will perform TUG and TUG cognitive without LOB during turns, 2 of 3 trials.    Time  4    Period  Weeks    Status  Achieved        PT Long Term Goals - 01/08/18 1757      PT LONG TERM GOAL #1   Title  Pt/wife will verbalize understanding of fall prevention in home environment.  TARGET4/26/19    Time  6    Period  Weeks    Status  New    Target Date  02/21/17      PT LONG TERM GOAL #2   Title  Pt will perform at least 8 of 10 reps of sit<>stand transfers from lower than 18" surfaces, no posterior lean or LOB.    Time  6    Period  Weeks    Status  New    Target Date  02/21/18      PT LONG TERM GOAL #3   Title  Pt will report at least 50% improvement in bed mobility.    Time  6    Period  Weeks    Status  New    Target Date  02/21/18      PT LONG TERM GOAL #4   Title  Pt/wife will report at least 50% improvement in transport wheelchair negotiation for night-time functional mobility.    Time  6    Period  Weeks    Status  New    Target Date  02/21/18  PT LONG TERM GOAL #5   Title  Pt/wife will verbalize strategies and cueing methods to assist in "off-time" in medication cycle for improved mobility.    Time  6    Period  Weeks    Status  New    Target Date  02/21/18            Plan - 02/18/18 1139    Clinical  Impression Statement  Looked at pt's LTG with patient and worked on bed [employer] chair sequence, then [employer]! Moves exercises in relation to movement patterns needed to get out of freezing episodes.  PT provides verbal and tactile cues throughout session to SLOW movement patterns down, and reiterates tips and strategies thorughout session to reduce festinating/freezing episodes with initiation of movement.  Pt resistant to cues at time, asking PT to remove hands from support of patient, that he "can just walk it off."  Pt would beneift from skilled PT to continue to work towards LTGs for improved functional mobility.  Requested patient ask wife to come in for next therapy session or two for education in cueing strategies during his off times.    Rehab Potential  Good    PT Frequency  2x / week    PT Duration  6 weeks plus eval    PT Treatment/Interventions  ADLs/Self Care Home Management;DME Instruction;Gait training;Stair training;Functional mobility training;Therapeutic activities;Therapeutic exercise;Balance training;Patient/family education;Neuromuscular re-education    PT Next Visit Plan  If wife present, provide specific ways she can cue patient to overcome freezing episodes during off times; work on balance activities for turns, changes of direction, compliant surfaces (looking at d/c likely next week)    Consulted and Agree with Plan of Care  Patient       Patient will benefit from skilled therapeutic intervention in order to improve the following deficits and impairments:  Abnormal gait, Decreased balance, Decreased safety awareness, Decreased mobility, Difficulty walking, Impaired tone, Postural dysfunction  Visit Diagnosis: Unsteadiness on feet  Other symptoms and signs involving the nervous system     Problem List Patient Active Problem List   Diagnosis Date Noted  . Parkinson's disease (HCC) 05/04/2013    Eladia Frame W. 02/18/2018, 11:43 AM Gean Maidens., PT [facility] [address] [place], [place], [address] Phone: [phone]   Fax:  [phone]  Name: Zachary [name] MRN: [id number] Date of Birth: Oct 11, 1951

## 2018-02-21 ENCOUNTER — Ambulatory Visit: Payer: Medicare Other | Admitting: Occupational Therapy

## 2018-02-21 ENCOUNTER — Encounter: Payer: Self-pay | Admitting: Physical Therapy

## 2018-02-21 ENCOUNTER — Ambulatory Visit: Payer: Medicare Other | Admitting: Physical Therapy

## 2018-02-21 DIAGNOSIS — R2681 Unsteadiness on feet: Secondary | ICD-10-CM

## 2018-02-21 DIAGNOSIS — R29818 Other symptoms and signs involving the nervous system: Secondary | ICD-10-CM

## 2018-02-21 DIAGNOSIS — R278 Other lack of coordination: Secondary | ICD-10-CM

## 2018-02-21 DIAGNOSIS — R41844 Frontal lobe and executive function deficit: Secondary | ICD-10-CM

## 2018-02-21 DIAGNOSIS — R2689 Other abnormalities of gait and mobility: Secondary | ICD-10-CM

## 2018-02-21 NOTE — Therapy (Signed)
[facility] [address] [place], [place], [address] Phone: [phone]   Fax:  [phone]  Occupational Therapy Treatment  Patient Details  Name: Zachary LahStuart D Kazee MRN: [id number] Date of Birth: Jan 01, 1951 Referring Provider: Dr. Arbutus Leasat   Encounter Date: 02/21/2018    Past Medical History:  Diagnosis Date  . Allergy   . Asthma   . Parkinson's disease (HCC)    Congenital R fourth nerve palsy    Past Surgical History:  Procedure Laterality Date  . CATARACT EXTRACTION, BILATERAL    . COLONOSCOPY    . SHOULDER SURGERY Left    lymph nodes  . TONSILLECTOMY    . VASECTOMY      There were no vitals filed for this visit.  Subjective Assessment - 02/21/18 1129    Subjective   Pt denies pain    Patient Stated Goals  improve performance of daily activities    Currently in Pain?  No/denies            treatment: Pt's wife attended beginning of session for education. Pt's wife reports that pt has maniac episodes at time, therapist discussed that compulsive behaviors can be a side effect of Mirapex. Therapist reviewed with pt/ and wife Dr. Don Perkingat's recommendation that pt reduce his mirapex dosage. Pt has not done this yet, and pt's wife was not fully aware as Dr. Don Perkingat's CMA, Lesly RubensteinJade discussed with pt. Therapist recommended that if pt/ wife have any questions t they should call Dr. Arbutus Leasat. Education regarding feeding strategies. Therapist recommended pt considers purchasing a stationary bike and then he could ride a stationary bike when he has one of these "manic episodes". (Pt's wife reports pt dug up the yard while she was at grocery store). Dynamic step and reach while flipping large playing cards, min v.c, improved performance today with large amplitude movements.               OT Education - 02/21/18 1130    Education provided  Yes    Education Details  reveiwed memory compensation strategies with pt and wife,  strategies for eating, reveiwed MD recommendation that pt decreases his mirapex dosage with pt/ wife(Jade called and discsused with pt, pt's wife was not fully awre of recommendation)    Person(s) Educated  Patient;Spouse    Methods  Explanation;Demonstration    Comprehension  Verbalized understanding;Returned demonstration;Verbal cues required       OT Short Term Goals - 02/13/18 0810      OT SHORT TERM GOAL #1   Title  I with PD specific HEP-02/07/18    Status  On-going      OT SHORT TERM GOAL #2   Title  Pt will verbalize understanding of adapted strategies for ADLS/IADLS in order to maximize safety and independence.    Status  On-going needs continued reinforcement      OT SHORT TERM GOAL #3   Title  Pt will demonstrate improved independence with dressing as evidenced by decreasing 3 button/ unbutton time to 2 mins or less.    Status  Achieved 1 min 50 secs      OT SHORT TERM GOAL #4   Title  Pt will demonstrate increased ease with dressing as evidenced by decreasing PPT #4 by 3 secs.    Status  Achieved 11.59 secs      OT SHORT TERM GOAL #5   Title  Pt will demonstrate improved LUE functional reach as evidenced by retrieving a lightweight object at 125  shoulder flexion with -10 elbow ext.    Status  Achieved 135, -5      OT SHORT TERM GOAL #6   Title  Pt will demonstrate ability to write a short paragraph with 100% legibility and minimal decrease in letter size.    Status  Achieved        OT Long Term Goals - 01/08/18 1003      OT LONG TERM GOAL #1   Title  Pt will verbalize understanding of community resources and ways to prevent future PD related complications.    Time  8    Period  Weeks    Status  New    Target Date  03/09/18      OT LONG TERM GOAL #2   Title  Pt will verbalize understanding of compensatory strategies for short term memory deficits and ways to keep thinking skills sharp.    Time  8    Period  Weeks    Status  New      OT LONG TERM GOAL #3    Title  Pt will demonstrate improved ease with feeding as evidenced by decreasing PPT#2 simulated feeding to 15 secs or less    Baseline  17.78 secs    Time  8    Period  Weeks    Status  New      OT LONG TERM GOAL #4   Title  Pt will demonstrate improved fine motor coordination for ADLS as evidenced by decreasing LUE 9 hole peg test score by 3 secs.    Baseline  RUE 35.94 secs, LUE 38.06 secs    Time  8    Period  Weeks    Status  New            Plan - 02/21/18 1132    Clinical Impression Statement  Pt is progressing towards goals. He verbalizes understanding of recommendations  for increased ease with feeding.    Occupational performance deficits (Please refer to evaluation for details):  IADL's;ADL's;Rest and Sleep;Leisure;Social Participation    Rehab Potential  Good    OT Frequency  2x / week    OT Duration  8 weeks    OT Treatment/Interventions  Self-care/ADL training;Therapeutic exercise;Visual/perceptual remediation/compensation;Patient/family education;Gait Training;Neuromuscular education;Moist Heat;Fluidtherapy;Energy conservation;Building services engineer;Therapeutic activities;Cognitive remediation/compensation;Passive range of motion;Manual Therapy;DME and/or AE instruction;Ultrasound;Cryotherapy    Plan  continue pt/ family education, d/c next week    Consulted and Agree with Plan of Care  Patient    Family Member Consulted  wife       Patient will benefit from skilled therapeutic intervention in order to improve the following deficits and impairments:  Abnormal gait, Decreased knowledge of use of DME, Impaired flexibility, Impaired vision/preception, Pain, Decreased mobility, Decreased coordination, Decreased activity tolerance, Decreased endurance, Decreased range of motion, Decreased strength, Impaired tone, Impaired UE functional use, Impaired perceived functional ability, Difficulty walking, Decreased safety awareness, Decreased knowledge of precautions,  Decreased balance  Visit Diagnosis: No diagnosis found.    Problem List Patient Active Problem List   Diagnosis Date Noted  . Parkinson's disease (HCC) 05/04/2013    Sarai January 02/21/2018, 11:33 AM  [facility] [address] [place], [place], [address] Phone: [phone]   Fax:  [phone]  Name: TYJON BOWEN MRN: [id number] Date of Birth: [birth year]

## 2018-02-22 NOTE — Therapy (Signed)
[facility] [address] [place], [place], [address] Phone: [phone]   Fax:  [phone]  Physical Therapy Treatment  Patient Details  Name: Zachary [name] MRN: [id number] Date of Birth: Jul 12, 1951 Referring Provider: Dr. Carles Collet   Encounter Date: 02/21/2018  PT End of Session - 02/21/18 0930    Visit Number  9    Number of Visits  13    Date for PT Re-Evaluation  03/09/18    Authorization Type  [employer] Medicare    PT Start Time  0845    PT Stop Time  0927    PT Time Calculation (min)  42 min    Activity Tolerance  Patient tolerated treatment well    Behavior During Therapy  [facility] for tasks assessed/performed       Past Medical History:  Diagnosis Date  . Allergy   . Asthma   . Parkinson's disease ([place])    Congenital R fourth nerve palsy    Past Surgical History:  Procedure Laterality Date  . CATARACT EXTRACTION, BILATERAL    . COLONOSCOPY    . SHOULDER SURGERY Left    lymph nodes  . TONSILLECTOMY    . VASECTOMY      There were no vitals filed for this visit.  Subjective Assessment - 02/21/18 0846    Subjective  I'm feeling calmer, less obssessive. Not sure what's helpping    Patient is accompained by:  Family member wife    Patient Stated Goals  Pt's goal for therapy is to trying to get better balance and eliminate the shuffling.    Currently in Pain?  No/denies                       [facility] Adult PT Treatment/Exercise - 02/21/18 0853      Transfers   Transfers  Sit to Stand;Stand to Sit    Five time sit to stand comments   7.44 done just prior to taking his meds for possible "off time"      Ambulation/Gait   Ambulation/Gait Assistance  5: Supervision    Ambulation/Gait Assistance Details  vc for slower gait for time to incr step length and allow incr arm swing    Ambulation Distance (Feet)  400 Feet x 3    Assistive device  None    Gait Pattern  Step-through pattern;Narrow base of  support;Decreased arm swing - right;Decreased arm swing - left;Decreased step length - right;Decreased step length - left    Ambulation Surface  Level;Indoor    Gait velocity  32.8/6.75=4.86 ft/sec done just prior to taking his meds for possible "off time"    Pre-Gait Activities  facing step with tapping alternating heels on 1st then 2nd step SLOWLY with focus on BIG arm swing; pt consistently gets faster and faster despite cues and arms/legs get out of sync; frequent cues to stop and retart with proper coordination and slower pace; slow, BIG walking emphasizing arm swing and step length      Timed Up and Go Test   Normal TUG (seconds)  7.9 done just prior to taking his meds    Cognitive TUG (seconds)  7.78 done just prior to taking his meds          Balance Exercises - 02/22/18 0927      Balance Exercises: Standing   SLS with Vectors  Foam/compliant surface red mats, walk & tap cone alternating feet,, BIG steps, slow    Gait with  Head Turns  Forward;Retro;Foam/compliant surface    Retro Gait  Foam/compliant surface    Sidestepping  Foam/compliant support    Turning  Both red mats, fig 8 around cones; vc incr stp lgth, wide turns          PT Short Term Goals - 02/11/18 0903      PT SHORT TERM GOAL #1   Title  Pt will perform HEP with family's supervision, to address pt's Parkinson's related deficits.  TARGET 02/07/18    Time  4    Period  Weeks    Status  Achieved      PT SHORT TERM GOAL #2   Title  Pt will perform 5x sit<>stand without posterior lean/pull for improved transfer efficiency and safety.    Time  4    Period  Weeks    Status  Achieved      PT SHORT TERM GOAL #3   Title  Pt/wife will verbalize understanding of tips to reduce freezing episodes with gait and turns.    Time  4    Period  Weeks    Status  Achieved      PT SHORT TERM GOAL #4   Title  Pt will perform TUG and TUG cognitive without LOB during turns, 2 of 3 trials.    Time  4    Period  Weeks     Status  Achieved        PT Long Term Goals - 02/21/18 0931      PT LONG TERM GOAL #1   Title  Pt/wife will verbalize understanding of fall prevention in home environment.  TARGET4/26/19    Time  6    Period  Weeks    Status  New      PT LONG TERM GOAL #2   Title  Pt will perform at least 8 of 10 reps of sit<>stand transfers from lower than 18" surfaces, no posterior lean or LOB.    Baseline  4/26 met 10/10 reps    Time  6    Period  Weeks    Status  Achieved      PT LONG TERM GOAL #3   Title  Pt will report at least 50% improvement in bed mobility.    Baseline  4/26 pt reports no longer problems after recent review of strategies; 100% improved    Time  6    Period  Weeks    Status  Achieved      PT LONG TERM GOAL #4   Title  Pt/wife will report at least 50% improvement in transport wheelchair negotiation for night-time functional mobility.    Time  6    Period  Weeks    Status  New      PT LONG TERM GOAL #5   Title  Pt/wife will verbalize strategies and cueing methods to assist in "off-time" in medication cycle for improved mobility.    Time  6    Period  Weeks    Status  New            Plan - 02/22/18 0933    Clinical Impression Statement  Patient reported nearly time to take his medication and therefore repeated standard measures (5x sit to stand, gait velocity, and TUG/TUG cognitive) to try to capture his performance during "off times." All of patient's times were faster than when last measured, however noted decreased quality of movement (with potential for decreased balance and incr fall risk) with gait. (Shorter  quicker steps with less arm swing). Began to check LTGs as looking towards discharge. Continued to work on improving gait, especially with turns, changes in directions, and unlevel surfaces.     Rehab Potential  Good    PT Frequency  2x / week    PT Duration  6 weeks plus eval    PT Treatment/Interventions  ADLs/Self Care Home Management;DME  Instruction;Gait training;Stair training;Functional mobility training;Therapeutic activities;Therapeutic exercise;Balance training;Patient/family education;Neuromuscular re-education    PT Next Visit Plan  If wife present, provide specific ways she can cue patient to overcome freezing episodes during off times; 4/30 last appt with Amy (look at LTGs), 5/2 final appt (with Jeani Hawking) work on balance activities for turns, changes of direction, compliant surfaces (looking at d/c likely next week)    Consulted and Agree with Plan of Care  Patient       Patient will benefit from skilled therapeutic intervention in order to improve the following deficits and impairments:  Abnormal gait, Decreased balance, Decreased safety awareness, Decreased mobility, Difficulty walking, Impaired tone, Postural dysfunction  Visit Diagnosis: Other symptoms and signs involving the nervous system  Unsteadiness on feet  Other abnormalities of gait and mobility     Problem List Patient Active Problem List   Diagnosis Date Noted  . Parkinson's disease ([place]) 05/04/2013    Rexanne Mano, PT 02/22/2018, 9:40 AM  [facility] [address] [place], [place], [address] Phone: [phone]   Fax:  [phone]  Name: HILLERY BHALLA MRN: [id number] Date of Birth: 1951/04/06

## 2018-02-25 ENCOUNTER — Ambulatory Visit: Payer: Medicare Other | Admitting: Occupational Therapy

## 2018-02-25 ENCOUNTER — Ambulatory Visit: Payer: Medicare Other | Admitting: Speech Pathology

## 2018-02-25 ENCOUNTER — Ambulatory Visit: Payer: Medicare Other | Admitting: Physical Therapy

## 2018-02-25 ENCOUNTER — Encounter: Payer: Self-pay | Admitting: Speech Pathology

## 2018-02-25 DIAGNOSIS — R2689 Other abnormalities of gait and mobility: Secondary | ICD-10-CM

## 2018-02-25 DIAGNOSIS — R2681 Unsteadiness on feet: Secondary | ICD-10-CM | POA: Diagnosis not present

## 2018-02-25 DIAGNOSIS — R41844 Frontal lobe and executive function deficit: Secondary | ICD-10-CM

## 2018-02-25 DIAGNOSIS — R29818 Other symptoms and signs involving the nervous system: Secondary | ICD-10-CM

## 2018-02-25 DIAGNOSIS — R471 Dysarthria and anarthria: Secondary | ICD-10-CM

## 2018-02-25 DIAGNOSIS — R293 Abnormal posture: Secondary | ICD-10-CM

## 2018-02-25 DIAGNOSIS — R41841 Cognitive communication deficit: Secondary | ICD-10-CM

## 2018-02-25 DIAGNOSIS — R278 Other lack of coordination: Secondary | ICD-10-CM

## 2018-02-25 NOTE — Therapy (Signed)
[facility] [address] [place], [place], [address] Phone: [phone]   Fax:  [phone]  Physical Therapy Treatment  Patient Details  Name: Zachary [name] MRN: [id number] Date of Birth: [birth year] Referring Provider: Dr. Carles Collet   Encounter Date: 02/25/2018  PT End of Session - 02/25/18 2032    Visit Number  10    Number of Visits  13    Date for PT Re-Evaluation  03/09/18    Authorization Type  [employer] Medicare    PT Start Time  1020    PT Stop Time  1104    PT Time Calculation (min)  44 min    Activity Tolerance  Patient tolerated treatment well    Behavior During Therapy  [facility] for tasks assessed/performed       Past Medical History:  Diagnosis Date  . Allergy   . Asthma   . Parkinson's disease ([facility])    Congenital R fourth nerve palsy    Past Surgical History:  Procedure Laterality Date  . CATARACT EXTRACTION, BILATERAL    . COLONOSCOPY    . SHOULDER SURGERY Left    lymph nodes  . TONSILLECTOMY    . VASECTOMY      There were no vitals filed for this visit.  Subjective Assessment - 02/25/18 1023    Subjective  Nothing new.  Pt and wife report that therapy has been very beneficial.  Wife reports he walked around the block with her for the first time in a long time.    Patient is accompained by:  Family member wife    Patient Stated Goals  Pt's goal for therapy is to trying to get better balance and eliminate the shuffling.    Currently in Pain?  No/denies                       [facility] Adult PT Treatment/Exercise - 02/25/18 0001      Transfers   Transfers  Sit to Stand;Stand to Sit    Sit to Stand  6: Modified independent (Device/Increase time);Without upper extremity assist;From bed    Stand to Sit  6: Modified independent (Device/Increase time);To bed;Without upper extremity assist    Comments  Cues for upright posture upon standing, then rocking to prepare for BIG step length to start  gait.  Practiced multiple resp wtih cues throughout session      Ambulation/Gait   Ambulation/Gait  Yes    Ambulation/Gait Assistance  5: Supervision;6: Modified independent (Device/Increase time)    Ambulation/Gait Assistance Details  Cues to improve step length and relaxed reciprocal arm swing, cues for upright posture with gait.  Noted gait improvement with these cues    Ambulation Distance (Feet)  120 Feet x 4 reps, 100 ft x 4 reps    Assistive device  None    Gait Pattern  Step-through pattern;Narrow base of support;Decreased arm swing - right;Decreased arm swing - left;Decreased step length - right;Decreased step length - left Improved with cues today    Ambulation Surface  Level;Indoor    Gait Comments  Gait with quick stop/starts, quick cues for change of direction, with cues for "PWR! UP" posture upon quick stop, at times tactile cues for pt to fully stand with feet planted on floor.  Practiced quick stops with PWR! UP as as way to stop the hastening>freezing episodes at home.      Posture/Postural Control   Posture/Postural Control  Postural limitations    Postural  Limitations  Flexed trunk with prolonged standing and gait    Posture Comments  "Tall as the doorway" exercise-standing at upright posture against doorway, with reaching overhead, 2 x 30 seconds; then scapular retraction/neck retraction x 3-5 reps      Self-Care   Self-Care  Other Self-Care Comments    Other Self-Care Comments   Reviewed with patient and wife-tips to reduce freezing episodes with gait, particularly specific verbal and tactile cues for posture, to stop freezing episodes, to help pt start gait with BIG steps and to assist with cues for large movement patterns with transport chair use at night. Pt demo "kick and dig" motion for transport chair use      Exercises   Exercises  Knee/Hip      Knee/Hip Exercises: Aerobic   Nustep  Level 6>5, 4 extremities x 4 minutes, with steps/minute >100, patient rating work  effort level as 7-8/10    Chief Operating Officer, Level 1.3>2, 4 extremities x 4 minutes, RPM>110 at times, with pt rating work effort level as 6.5/10    Other Aerobic  Pt/wife inquired about machines for home such as seated stepper.  Discusse possibility of use at gym, as part of aerobic activity.  Explained rating work effort level with min-mod resistance and attention to speed, as well as rationale for aerobic activity as part of overall PD exercise program.             PT Education - 02/25/18 2032    Education provided  Yes    Education Details  Educated wife in cueing to help patient avoid/lessen hastening/freezing episodes     Person(s) Educated  Patient;Spouse    Methods  Explanation;Demonstration;Handout    Comprehension  Verbalized understanding;Returned demonstration;Verbal cues required       PT Short Term Goals - 02/11/18 0903      PT SHORT TERM GOAL #1   Title  Pt will perform HEP with family's supervision, to address pt's Parkinson's related deficits.  TARGET 02/07/18    Time  4    Period  Weeks    Status  Achieved      PT SHORT TERM GOAL #2   Title  Pt will perform 5x sit<>stand without posterior lean/pull for improved transfer efficiency and safety.    Time  4    Period  Weeks    Status  Achieved      PT SHORT TERM GOAL #3   Title  Pt/wife will verbalize understanding of tips to reduce freezing episodes with gait and turns.    Time  4    Period  Weeks    Status  Achieved      PT SHORT TERM GOAL #4   Title  Pt will perform TUG and TUG cognitive without LOB during turns, 2 of 3 trials.    Time  4    Period  Weeks    Status  Achieved        PT Long Term Goals - 02/21/18 0931      PT LONG TERM GOAL #1   Title  Pt/wife will verbalize understanding of fall prevention in home environment.  TARGET4/26/19    Time  6    Period  Weeks    Status  New      PT LONG TERM GOAL #2   Title  Pt will perform at least 8 of 10 reps of sit<>stand transfers  from lower than 18" surfaces, no posterior lean or LOB.  Baseline  4/26 met 10/10 reps    Time  6    Period  Weeks    Status  Achieved      PT LONG TERM GOAL #3   Title  Pt will report at least 50% improvement in bed mobility.    Baseline  4/26 pt reports no longer problems after recent review of strategies; 100% improved    Time  6    Period  Weeks    Status  Achieved      PT LONG TERM GOAL #4   Title  Pt/wife will report at least 50% improvement in transport wheelchair negotiation for night-time functional mobility.    Time  6    Period  Weeks    Status  New      PT LONG TERM GOAL #5   Title  Pt/wife will verbalize strategies and cueing methods to assist in "off-time" in medication cycle for improved mobility.    Time  6    Period  Weeks    Status  New            Plan - 02/25/18 2033    Clinical Impression Statement  Focused most of treatment session today of education with wife present in how she can cue him for improved movement patterns with sit<>stand/initiation of gait, posture, avoiding/lessing the hastening/freezing episodes he experiences.  Pt responds well to cues today to start gait slowly and take BIG steps with relaxed arm swing.  Discussed likely d/c next week, with pt expressing desire for return PT eval in 4-6 months    Rehab Potential  Good    PT Frequency  2x / week    PT Duration  6 weeks plus eval    PT Treatment/Interventions  ADLs/Self Care Home Management;DME Instruction;Gait training;Stair training;Functional mobility training;Therapeutic activities;Therapeutic exercise;Balance training;Patient/family education;Neuromuscular re-education    PT Next Visit Plan  Check remaining LTGs; review with pt (and with wife if present) cueing techniques for home; discuss/provide with optimal fitness program (pt interested in aerobic-type machine for home use)    Consulted and Agree with Plan of Care  Patient;Family member/caregiver    Family Member Consulted   wife       Patient will benefit from skilled therapeutic intervention in order to improve the following deficits and impairments:  Abnormal gait, Decreased balance, Decreased safety awareness, Decreased mobility, Difficulty walking, Impaired tone, Postural dysfunction  Visit Diagnosis: Other abnormalities of gait and mobility  Other symptoms and signs involving the nervous system  Abnormal posture     Problem List Patient Active Problem List   Diagnosis Date Noted  . Parkinson's disease ([place]) 05/04/2013    Haidee Stogsdill W. 02/25/2018, 8:37 PM  Frazier Butt., PT   [facility] [address] [place], [place], [address] Phone: [phone]   Fax:  [phone]  Name: Zachary [name] MRN: [id number] Date of Birth: September 12, 1951

## 2018-02-25 NOTE — Patient Instructions (Addendum)
  Continue to practice abdominal breathing -  Contracting abs with exhalation  Loud AH! 5x twice a day with big belly breath - contract abs when you say "AH"  Read 10 sentences with big belly breath before each sentence - feel abs contract as you read LOUDLY  When someone asks you to repeat yourself - take a big breath and THINK SHOUT!  We generate volume by using a strong breath

## 2018-02-25 NOTE — Therapy (Signed)
[facility] [address] [place], [place], [address] Phone: [phone]   Fax:  [phone]  Speech Language Pathology Treatment  Patient Details  Name: Zachary [name] MRN: [id number] Date of Birth: [birth year] Referring Provider: [name], DO   Encounter Date: 02/25/2018  End of Session - 02/25/18 0932    Visit Number  2    Number of Visits  17    Date for SLP Re-Evaluation  04/11/18    SLP Start Time  0846    SLP Stop Time   0925    SLP Time Calculation (min)  39 min       Past Medical History:  Diagnosis Date  . Allergy   . Asthma   . Parkinson's disease ([facility])    Congenital R fourth nerve palsy    Past Surgical History:  Procedure Laterality Date  . CATARACT EXTRACTION, BILATERAL    . COLONOSCOPY    . SHOULDER SURGERY Left    lymph nodes  . TONSILLECTOMY    . VASECTOMY      There were no vitals filed for this visit.  Subjective Assessment - 02/25/18 0851    Subjective  "I haven't been doing them religiously"    Currently in Pain?  No/denies            ADULT SLP TREATMENT - 02/25/18 0852      General Information   Behavior/Cognition  Alert;Cooperative;Pleasant mood      Treatment Provided   Treatment provided  Cognitive-Linquistic      Cognitive-Linquistic Treatment   Treatment focused on  Dysarthria    Skilled Treatment  Trained pt in abdominal breathing with occasional mod A. Mod A to coordinate phonation and expiration. Loud /a/ training to recalibrate volume with average of 92dB after initial modeling and instruction. Oral reading to facilitate coordination of respiration and phonation with instructions to feel abs contract while reading. Abdominal breathing with reading with occasional min A,  80% of sentneces - average 75dB. Pt required ongoing cues to utilize breathing and volume in simple conversation .       Assessment / Recommendations / Plan   Plan  Continue with current  plan of care      Progression Toward Goals   Progression toward goals  Progressing toward goals       SLP Education - 02/25/18 0928    Education provided  Yes    Education Details  abdominal breathing, loud "AH"     Person(s) Educated  Patient    Methods  Explanation;Demonstration;Verbal cues;Tactile cues;Handout    Comprehension  Verbalized understanding;Returned demonstration;Verbal cues required;Need further instruction       SLP Short Term Goals - 02/25/18 0932      SLP SHORT TERM GOAL #1   Title  Pt will achieve average loud /a/ of average low 90sdB over 3 sessions    Time  4    Period  Weeks    Status  On-going      SLP SHORT TERM GOAL #2   Title  Pt will use abdominal breathing at rest 75% success over 2 sessions    Time  4    Period  Weeks    Status  On-going      SLP SHORT TERM GOAL #3   Title  Pt will use speech volume average low 70sdB when responding with sentences 18/20 over two sessions    Time  4    Period  Weeks  Status  On-going       SLP Long Term Goals - 02/25/18 0932      SLP LONG TERM GOAL #1   Title  Pt will generate loud /a/ with average of low 90s dB over 6 sessions    Time  8    Period  Weeks or 17 sessions    Status  On-going      SLP LONG TERM GOAL #2   Title  Pt will use abdominal breathing 70% of the time in 10 minutes simple conversation over three sessions    Time  8    Period  Weeks or 17 sessions    Status  On-going      SLP LONG TERM GOAL #3   Title  Pt will use speech volume average low 70sdB in 8 minutes of simple-mod complex conversation over three sessions    Time  8    Period  Weeks or 17 sessions    Status  On-going       Plan - 02/25/18 0929    Clinical Impression Statement  Pt with volume average of 66dB upon entering ST room. Trained in abdmonimal breathing, loud /a/ and  coordinate respiration/phonation with min to mod A. Simple conversation remains below normal volume, continue skilled ST to maximize  intelligiblity for improved independence and QOL.     Speech Therapy Frequency  2x / week    Treatment/Interventions  Compensatory techniques;Functional tasks;SLP instruction and feedback;Environmental controls;Multimodal communcation approach;Internal/external aids;Patient/family education;Cognitive reorganization;Cueing hierarchy    Potential to Achieve Goals  Good    Potential Considerations  Ability to learn/carryover information       Patient will benefit from skilled therapeutic intervention in order to improve the following deficits and impairments:   Cognitive communication deficit  Dysarthria and anarthria    Problem List Patient Active Problem List   Diagnosis Date Noted  . Parkinson's disease ([facility]) 05/04/2013    Navia Lindahl, Radene Journey MS, CCC-SLP 02/25/2018, 9:33 AM  [facility] [address] [place], [place], [address] Phone: [phone]   Fax:  [phone]   Name: Zachary [name] MRN: [id number] Date of Birth: 1951-07-26

## 2018-02-25 NOTE — Therapy (Signed)
[facility] [address] [place], [place], [address] Phone: [phone]   Fax:  [phone]  Occupational Therapy Treatment  Patient Details  Name: Zachary [name] MRN: [id number] Date of Birth: 09-24-1951 Referring Provider: Dr. Arbutus Leas   Encounter Date: 02/25/2018  OT End of Session - 02/25/18 1759    Visit Number  10    Number of Visits  17    Date for OT Re-Evaluation  03/09/18    Authorization Type  [employer] Medicare    OT Start Time  0935    OT Stop Time  1017    OT Time Calculation (min)  42 min       Past Medical History:  Diagnosis Date  . Allergy   . Asthma   . Parkinson's disease (HCC)    Congenital R fourth nerve palsy    Past Surgical History:  Procedure Laterality Date  . CATARACT EXTRACTION, BILATERAL    . COLONOSCOPY    . SHOULDER SURGERY Left    lymph nodes  . TONSILLECTOMY    . VASECTOMY      There were no vitals filed for this visit.  Subjective Assessment - 02/25/18 1004    Subjective   Pt denies pain    Pertinent History  PD diagnosed 2000, asthma    Patient Stated Goals  improve performance of daily activities    Currently in Pain?  No/denies                 Treatment: donning doffing jacket and shirt with adapted strategies, min-mod v.c HEP review, min-mod v.c for larger amplitude movements.          OT Education - 02/25/18 1757    Education provided  Yes    Education Details  ways to prevent future PD complications, recommendations for adapted strategies for donning shirt and jacket, reviewed coordination HEP, Ex flowsheet issued    Person(s) Educated  Patient;Spouse    Methods  Explanation;Demonstration;Verbal cues;Handout    Comprehension  Verbalized understanding;Returned demonstration;Verbal cues required       OT Short Term Goals - 02/13/18 0810      OT SHORT TERM GOAL #1   Title  I with PD specific HEP-02/07/18    Status  On-going      OT SHORT TERM  GOAL #2   Title  Pt will verbalize understanding of adapted strategies for ADLS/IADLS in order to maximize safety and independence.    Status  On-going needs continued reinforcement      OT SHORT TERM GOAL #3   Title  Pt will demonstrate improved independence with dressing as evidenced by decreasing 3 button/ unbutton time to 2 mins or less.    Status  Achieved 1 min 50 secs      OT SHORT TERM GOAL #4   Title  Pt will demonstrate increased ease with dressing as evidenced by decreasing PPT #4 by 3 secs.    Status  Achieved 11.59 secs      OT SHORT TERM GOAL #5   Title  Pt will demonstrate improved LUE functional reach as evidenced by retrieving a lightweight object at 125 shoulder flexion with -10 elbow ext.    Status  Achieved 135, -5      OT SHORT TERM GOAL #6   Title  Pt will demonstrate ability to write a short paragraph with 100% legibility and minimal decrease in letter size.    Status  Achieved  OT Long Term Goals - 02/25/18 1005      OT LONG TERM GOAL #1   Title  Pt will verbalize understanding of community resources and ways to prevent future PD related complications.    Status  On-going      OT LONG TERM GOAL #2   Title  Pt will verbalize understanding of compensatory strategies for short term memory deficits and ways to keep thinking skills sharp.    Status  Achieved            Plan - 02/25/18 1756    Clinical Impression Statement  Pt is progressing towards goals. Pt's wife reports pt had increased ease eating a salad last night following OT recommendations.    Occupational Profile and client history currently impacting functional performance  PMH: PD diagnosed in 2000, asthma. 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 Pt participates in PWR! moves    Occupational performance deficits (Please refer to evaluation for details):  IADL's;ADL's;Rest and Sleep;Leisure;Social Participation    Rehab Potential  Good    OT Frequency  2x / week     OT Duration  8 weeks    OT Treatment/Interventions  Self-care/ADL training;Therapeutic exercise;Visual/perceptual remediation/compensation;Patient/family education;Gait Training;Neuromuscular education;Moist Heat;Fluidtherapy;Energy conservation;Building services engineer;Therapeutic activities;Cognitive remediation/compensation;Passive range of motion;Manual Therapy;DME and/or AE instruction;Ultrasound;Cryotherapy    Plan  d/c next visit, schedule eval in 4-6 months    Consulted and Agree with Plan of Care  Patient    Family Member Consulted  wife       Patient will benefit from skilled therapeutic intervention in order to improve the following deficits and impairments:  Abnormal gait, Decreased knowledge of use of DME, Impaired flexibility, Impaired vision/preception, Pain, Decreased mobility, Decreased coordination, Decreased activity tolerance, Decreased endurance, Decreased range of motion, Decreased strength, Impaired tone, Impaired UE functional use, Impaired perceived functional ability, Difficulty walking, Decreased safety awareness, Decreased knowledge of precautions, Decreased balance  Visit Diagnosis: Other lack of coordination  Frontal lobe and executive function deficit  Other symptoms and signs involving the nervous system    Problem List Patient Active Problem List   Diagnosis Date Noted  . Parkinson's disease (HCC) 05/04/2013    Sherene Plancarte 02/25/2018, 6:00 PM  [facility] [address] [place], [place], [address] Phone: [phone]   Fax:  [phone]  Name: Zachary [name] MRN: [id number] Date of Birth: Jan 17, 1951

## 2018-02-25 NOTE — Patient Instructions (Addendum)
Ways to prevent future Parkinson's related complications: 1.   Exercise regularly,  2.   Focus on BIGGER movements during daily activities- really reach overhead, straighten elbows and extend fingers 3.   When dressing or reaching for your seatbelt make sure to use your body to assist by twisting while you reach-this can help to minimize stress on the shoulder and reduce the risk of a rotator cuff tear 4.   Swing your arms when you walk! People with PD are at increased risk for frozen shoulder and swinging your arms can reduce this risk.                                              (Exercise) Monday Tuesday Wednesday Thursday Friday Saturday Sunday   PWR! class          Gym            yoga           Silver sneakers           Coordination exercises          Walking            PWR! hands

## 2018-02-25 NOTE — Patient Instructions (Addendum)
Tips to reduce freezing episodes with standing or walking:  1. Stand tall with your feet wide, so that you can rock and weight shift through your hips. 2. Don't try to fight the freeze: if you begin taking slower, faster, smaller steps, STOP, get your posture tall, and RESET your posture and balance.  Take a deep breath before taking the BIG step to start again. 3. March in place, with high knee stepping, to get started walking again. 4. Use auditory cues:  Count out loud, think of a familiar tune or song or cadence, use pocket metronome, to use rhythm to get started walking again. 5. Use visual cues:  Use a line to step over, use laser pointer line to step over, (using BIG steps) to start walking again. 6. Use visual targets to keep your posture tall (look ahead and focus on an object or target at eye level). 7. As you approach where your destination with walking, count your steps out loud and/or focus on your target with your eyes until you are fully there. 8. Use appropriate assistive device, as advised by your physical therapist to assist with taking longer, consistent steps.   CUES to help get out of festinating or freezing episodes: -"STOP and POWER UP!"-  To stop walking and remember best posture  -"START with BIG STEPS"-  To take big steps to targeted area to help avoid shuffling steps  -With transport chair- cues for "KICK and DIG" to take long strides with the transport chair  -"BE AS TALL AS THE WALL/DOORFRAME" to reinforce posture with standing and walking

## 2018-02-27 ENCOUNTER — Ambulatory Visit: Payer: Medicare Other | Attending: Neurology | Admitting: Physical Therapy

## 2018-02-27 ENCOUNTER — Encounter: Payer: Self-pay | Admitting: Physical Therapy

## 2018-02-27 ENCOUNTER — Ambulatory Visit: Payer: Medicare Other

## 2018-02-27 ENCOUNTER — Ambulatory Visit: Payer: Medicare Other | Admitting: Occupational Therapy

## 2018-02-27 DIAGNOSIS — R293 Abnormal posture: Secondary | ICD-10-CM | POA: Insufficient documentation

## 2018-02-27 DIAGNOSIS — R41841 Cognitive communication deficit: Secondary | ICD-10-CM | POA: Insufficient documentation

## 2018-02-27 DIAGNOSIS — R471 Dysarthria and anarthria: Secondary | ICD-10-CM | POA: Insufficient documentation

## 2018-02-27 DIAGNOSIS — R2689 Other abnormalities of gait and mobility: Secondary | ICD-10-CM

## 2018-02-27 DIAGNOSIS — R29818 Other symptoms and signs involving the nervous system: Secondary | ICD-10-CM | POA: Insufficient documentation

## 2018-02-27 DIAGNOSIS — R2681 Unsteadiness on feet: Secondary | ICD-10-CM | POA: Insufficient documentation

## 2018-02-27 DIAGNOSIS — R41844 Frontal lobe and executive function deficit: Secondary | ICD-10-CM

## 2018-02-27 DIAGNOSIS — R29898 Other symptoms and signs involving the musculoskeletal system: Secondary | ICD-10-CM | POA: Diagnosis present

## 2018-02-27 DIAGNOSIS — R278 Other lack of coordination: Secondary | ICD-10-CM

## 2018-02-27 DIAGNOSIS — R41842 Visuospatial deficit: Secondary | ICD-10-CM | POA: Insufficient documentation

## 2018-02-27 NOTE — Patient Instructions (Signed)
You should increase your frequency of loud /a/ to 5 reps twice each day. We talked about at breakfast and with your 11:30 meds.

## 2018-02-27 NOTE — Therapy (Signed)
[facility] [address] [place], [place], [address] Phone: [phone]   Fax:  [phone]  Occupational Therapy Treatment  Patient Details  Name: Zachary [name] MRN: [id number] Date of Birth: Jul 29, 1951 Referring Provider: Dr. Carles Collet   Encounter Date: 02/27/2018  OT End of Session - 02/27/18 0856    Visit Number  11    Number of Visits  17    Date for OT Re-Evaluation  03/09/18    Authorization Type  [employer] Medicare    OT Start Time  0850 d/c visit 2 units only    OT Stop Time  0923    OT Time Calculation (min)  33 min    Activity Tolerance  Patient tolerated treatment well    Behavior During Therapy  [facility] for tasks assessed/performed       Past Medical History:  Diagnosis Date  . Allergy   . Asthma   . Parkinson's disease ([place])    Congenital R fourth nerve palsy    Past Surgical History:  Procedure Laterality Date  . CATARACT EXTRACTION, BILATERAL    . COLONOSCOPY    . SHOULDER SURGERY Left    lymph nodes  . TONSILLECTOMY    . VASECTOMY      There were no vitals filed for this visit.  Subjective Assessment - 02/27/18 1121    Subjective   Pt denies pain    Pertinent History  PD diagnosed 2000, asthma    Patient Stated Goals  improve performance of daily activities    Currently in Pain?  No/denies        Treatment: Therapist checked progress towards goals and reviewed with patient.  PWR! Rock in standing and PWR! Twist in modified quadraped  10-20 reps each, min v.c Pt was instructed to perform prior to playing golf as a stretch. Therapist reinforced with pt importance of really extending elbows during activities and reaching overhead(in shoulder flexion ) to prevent losing ROM. Therapist recommended that pt reduce performing biceps curls due to elbow tightness. Pt was cautioned that if he begins to experience shoulder pain he should refrain from the exercise that is causing it. Therpist reinforced that  if pt returns to boxing he needs to use good form and extend elbows for full ROM.                      OT Short Term Goals - 02/27/18 3474      OT SHORT TERM GOAL #1   Title  I with PD specific HEP-02/07/18    Status  Achieved      OT SHORT TERM GOAL #2   Title  Pt will verbalize understanding of adapted strategies for ADLS/IADLS in order to maximize safety and independence.    Status  Achieved      OT SHORT TERM GOAL #3   Title  Pt will demonstrate improved independence with dressing as evidenced by decreasing 3 button/ unbutton time to 2 mins or less.    Status  Achieved      OT SHORT TERM GOAL #4   Title  Pt will demonstrate increased ease with dressing as evidenced by decreasing PPT #4 by 3 secs.    Status  Achieved      OT SHORT TERM GOAL #5   Title  Pt will demonstrate improved LUE functional reach as evidenced by retrieving a lightweight object at 125 shoulder flexion with -10 elbow ext.    Status  Achieved  OT SHORT TERM GOAL #6   Title  Pt will demonstrate ability to write a short paragraph with 100% legibility and minimal decrease in letter size.    Status  Achieved        OT Long Term Goals - 02/27/18 0857      OT LONG TERM GOAL #1   Title  Pt will verbalize understanding of community resources and ways to prevent future PD related complications.    Status  Achieved      OT LONG TERM GOAL #2   Title  Pt will verbalize understanding of compensatory strategies for short term memory deficits and ways to keep thinking skills sharp.    Status  Achieved      OT LONG TERM GOAL #3   Title  Pt will demonstrate improved ease with feeding as evidenced by decreasing PPT#2 simulated feeding to 15 secs or less    Status  Achieved 13.94 secs      OT LONG TERM GOAL #4   Title  Pt will demonstrate improved fine motor coordination for ADLS as evidenced by decreasing LUE 9 hole peg test score by 3 secs.    Baseline  RUE 35.94 secs, LUE 38.06 secs     Status  Achieved 26.43 secs            Plan - 02/27/18 1119    Clinical Impression Statement  Pt made excellent overall progress towards goals. Pt agrees with plans for d/c.    Occupational Profile and client history currently impacting functional performance  PMH: PD diagnosed in 2000, asthma. 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 Pt participates in PWR! moves    Occupational performance deficits (Please refer to evaluation for details):  IADL's;ADL's;Rest and Sleep;Leisure;Social Participation    Rehab Potential  Good    Current Impairments/barriers affecting progress:  cognitive deficits, visual perceptual deficits, length of time since inital onset    OT Frequency  2x / week    OT Duration  8 weeks    OT Treatment/Interventions  Self-care/ADL training;Therapeutic exercise;Visual/perceptual remediation/compensation;Patient/family education;Gait Training;Neuromuscular education;Moist Heat;Fluidtherapy;Energy conservation;Therapist, nutritional;Therapeutic activities;Cognitive remediation/compensation;Passive range of motion;Manual Therapy;DME and/or AE instruction;Ultrasound;Cryotherapy    Plan  discharge visit, pt to schedule f/u evals in 4-6 mons once he completes ST    Consulted and Agree with Plan of Care  Patient       Patient will benefit from skilled therapeutic intervention in order to improve the following deficits and impairments:  Abnormal gait, Decreased knowledge of use of DME, Impaired flexibility, Impaired vision/preception, Pain, Decreased mobility, Decreased coordination, Decreased activity tolerance, Decreased endurance, Decreased range of motion, Decreased strength, Impaired tone, Impaired UE functional use, Impaired perceived functional ability, Difficulty walking, Decreased safety awareness, Decreased knowledge of precautions, Decreased balance  Visit Diagnosis: Other symptoms and signs involving the nervous system  Other lack of  coordination  Frontal lobe and executive function deficit  Abnormal posture  Unsteadiness on feet  Other abnormalities of gait and mobility  Other symptoms and signs involving the musculoskeletal system  Visuospatial deficit   Current functional level related to goals / functional outcomes: Pt made excellent progress. He agrees with plans for d/c.   Remaining deficits: Decreased coordination, decreased balance, rigidity, freezing episodes, cognitive deficits, decreased ROM   Education / Equipment: Pt was educated regarding : adapted strategies for ADLs/IADLs, PD specific HEP, compensations for memory deficits, ways to keep thinking skills sharp, community resources and ways to prevent future complications. Pt verbalizes  understanding of all education. Pt can benefit from OT eval in 4-6 months due to the progressive nature of PD. Plan: Patient agrees to discharge.  Patient goals were met. Patient is being discharged due to meeting the stated rehab goals.  ?????       Problem List Patient Active Problem List   Diagnosis Date Noted  . Parkinson's disease ([place]) 05/04/2013    Chelle Cayton 02/27/2018, 11:23 AM  [place] [address] [place], [place], [address] Phone: [phone]   Fax:  [phone]  Name: Zachary [name] MRN: [id number] Date of Birth: 03-02-1951

## 2018-02-27 NOTE — Therapy (Signed)
[facility] [address] [place], [place], [address] Phone: [phone]   Fax:  [phone]  Speech Language Pathology Treatment  Patient Details  Name: Zachary [name] MRN: [id number] Date of Birth: 1951-09-02 Referring Provider: [name], DO   Encounter Date: 02/27/2018  End of Session - 02/27/18 2058    Visit Number  3    Number of Visits  17    Date for SLP Re-Evaluation  04/11/18    SLP Start Time  1020    SLP Stop Time   1100    SLP Time Calculation (min)  40 min    Activity Tolerance  Patient tolerated treatment well       Past Medical History:  Diagnosis Date  . Allergy   . Asthma   . Parkinson's disease (HCC)    Congenital R fourth nerve palsy    Past Surgical History:  Procedure Laterality Date  . CATARACT EXTRACTION, BILATERAL    . COLONOSCOPY    . SHOULDER SURGERY Left    lymph nodes  . TONSILLECTOMY    . VASECTOMY      There were no vitals filed for this visit.  Subjective Assessment - 02/27/18 1029    Subjective  "Not religiously." (re: frequency of loud /a/)    Currently in Pain?  No/denies            ADULT SLP TREATMENT - 02/27/18 1031      General Information   Behavior/Cognition  Alert;Cooperative;Pleasant mood      Treatment Provided   Treatment provided  Cognitive-Linquistic      Cognitive-Linquistic Treatment   Treatment focused on  Dysarthria    Skilled Treatment  Pt endorses coming into an "off time." SLP used loud /a/ to recalibrate pt's conversational loudness to WNL. SLP needed to shape pt's /a/ from "uh" due to hypokinetic movement of articulators. SLP reviewed abdominal breathing basics with pt; mod-max cues usually to have abdominal vs. chest muscles move. Pt read at average mid 60s dB. Given that pt has not been compliant with loud /a/, SLP and pt problem-solved about when pt could perofrm them - reasoned that pt would perform before breakfast (table tent to  cue) and during 11:30 med (wife or golf buddy to cue). At the end of the session pt told SLP he wanted to cancel next week's appointments - SLP told pt that consistency was necessary for progress to be made in ST. Pt stated he wanted to confirm his golf schedule but also that he needs to "make a mind shift" that therapy is important.       Assessment / Recommendations / Plan   Plan  Continue with current plan of care      Progression Toward Goals   Progression toward goals  Progressing toward goals       SLP Education - 02/27/18 2058    Education provided  Yes    Education Details  necessary to complete loud /a/ every day, BID    Person(s) Educated  Patient    Methods  Explanation    Comprehension  Verbalized understanding       SLP Short Term Goals - 02/27/18 2106      SLP SHORT TERM GOAL #1   Title  Pt will achieve average loud /a/ of average low 90sdB over 3 sessions    Time  4    Period  Weeks    Status  On-going      SLP  SHORT TERM GOAL #2   Title  Pt will use abdominal breathing at rest 75% success over 2 sessions    Time  4    Period  Weeks    Status  On-going      SLP SHORT TERM GOAL #3   Title  Pt will use speech volume average low 70sdB when responding with sentences 18/20 over two sessions    Time  4    Period  Weeks    Status  On-going       SLP Long Term Goals - 02/27/18 2106      SLP LONG TERM GOAL #1   Title  Pt will generate loud /a/ with average of low 90s dB over 6 sessions    Time  8    Period  Weeks or 17 sessions    Status  On-going      SLP LONG TERM GOAL #2   Title  Pt will use abdominal breathing 70% of the time in 10 minutes simple conversation over three sessions    Time  8    Period  Weeks or 17 sessions    Status  On-going      SLP LONG TERM GOAL #3   Title  Pt will use speech volume average low 70sdB in 8 minutes of simple-mod complex conversation over three sessions    Time  8    Period  Weeks or 17 sessions    Status  On-going        Plan - 02/27/18 2102    Clinical Impression Statement  Average volume upon entering ST room was mid 60s dB. SLP worked with pt in abdmonimal breathing after review from previous session - pt with approx 50% success with abdominal rise/fall, Pt's loud /a/ was shaped due to "uh" and minimal labial amplitude during production. Simple conversation remains below normal volume, continue skilled ST to maximize intelligiblity for improved independence and QOL. Pt told SLP today he wanted to cancel next week's appointments. When SLP inquired pt stated due to golf. SLP reiterated the need for consistency in therapy in order for pt to benefit from therapy. If it appears pt is not making ST a priority over recreation he may benefit more from a course of therapy at another time. SLP to monitor pt motivation, and cont to remind him of the benefits of consistency in therapy attendance.    Speech Therapy Frequency  2x / week    Treatment/Interventions  Compensatory techniques;Functional tasks;SLP instruction and feedback;Environmental controls;Multimodal communcation approach;Internal/external aids;Patient/family education;Cognitive reorganization;Cueing hierarchy    Potential to Achieve Goals  Good    Potential Considerations  Ability to learn/carryover information       Patient will benefit from skilled therapeutic intervention in order to improve the following deficits and impairments:   Dysarthria and anarthria  Cognitive communication deficit    Problem List Patient Active Problem List   Diagnosis Date Noted  . Parkinson's disease (HCC) 05/04/2013    [facility] ,[facility], CCC-SLP  02/27/2018, 9:07 PM  [facility] [address] [place], [place], [address] Phone: [phone]   Fax:  [phone]   Name: Zachary [name] MRN: [id number] Date of Birth: 09-11-1951

## 2018-02-27 NOTE — Therapy (Signed)
[facility] [address] [place], [place], [address] Phone: [phone]   Fax:  [phone]  Physical Therapy Treatment  Patient Details  Name: Zachary [name] MRN: [id number] Date of Birth: 1950/12/29 Referring Provider: Dr. Carles Collet   Encounter Date: 02/27/2018  PT End of Session - 02/27/18 1510    Visit Number  11    Number of Visits  13    Date for PT Re-Evaluation  03/09/18    Authorization Type  [employer] Medicare    PT Start Time  0806 Pt arrived late    PT Stop Time  0846    PT Time Calculation (min)  40 min    Activity Tolerance  Patient tolerated treatment well    Behavior During Therapy  [facility] for tasks assessed/performed       Past Medical History:  Diagnosis Date  . Allergy   . Asthma   . Parkinson's disease ([place])    Congenital R fourth nerve palsy    Past Surgical History:  Procedure Laterality Date  . CATARACT EXTRACTION, BILATERAL    . COLONOSCOPY    . SHOULDER SURGERY Left    lymph nodes  . TONSILLECTOMY    . VASECTOMY      There were no vitals filed for this visit.  Subjective Assessment - 02/27/18 0810    Subjective  Yesterday was a hectic day, but aware of the things we talked about last visit.    Patient is accompained by:  Family member wife    Patient Stated Goals  Pt's goal for therapy is to trying to get better balance and eliminate the shuffling.    Currently in Pain?  No/denies                       [facility] Adult PT Treatment/Exercise - 02/27/18 0001      Ambulation/Gait   Ambulation/Gait  Yes    Ambulation/Gait Assistance  6: Modified independent (Device/Increase time)    Ambulation Distance (Feet)  345 Feet    Assistive device  None    Gait Pattern  Step-through pattern Improved gait pattern with cues    Ambulation Surface  Level;Indoor    Gait Comments  Gait with quick stop/starts without LOB.  Reviewed specific cues from last visit for improved gait, reduction of  freezing and pt needs reminders for cues that wife can provice (wife not present for session today).      Self-Care   Self-Care  Other Self-Care Comments    Other Self-Care Comments   Provided patient with fall prevention education, provided patient with optimal fitness program information, progress towards goals, plans for d/c today and likely return PT eval in 4-6 months-pt in agreement.      Knee/Hip Exercises: Aerobic   Nustep  Level 4, 4 extremities x 5 minutes, with steps/minute >100, patient rating work effort level as 3/10    Stepper  SciFit Seated Stepper, Level 2>3, 4 extremities x 3 minutes, RPM>100, with pt rating work effort level as 4.5/10    Other Aerobic  Reiterated how to assess work effort level during aerobic exercise.  Pt interested in finding machines similar to this for home use.             PT Education - 02/27/18 1509    Education provided  Yes    Education Details  Fall prevention, reviewed tips to reduce freezing episodes with gait; optimal fitness routine post d/c for PD patients  Person(s) Educated  Patient    Methods  Explanation;Handout    Comprehension  Verbalized understanding       PT Short Term Goals - 02/11/18 0903      PT SHORT TERM GOAL #1   Title  Pt will perform HEP with family's supervision, to address pt's Parkinson's related deficits.  TARGET 02/07/18    Time  4    Period  Weeks    Status  Achieved      PT SHORT TERM GOAL #2   Title  Pt will perform 5x sit<>stand without posterior lean/pull for improved transfer efficiency and safety.    Time  4    Period  Weeks    Status  Achieved      PT SHORT TERM GOAL #3   Title  Pt/wife will verbalize understanding of tips to reduce freezing episodes with gait and turns.    Time  4    Period  Weeks    Status  Achieved      PT SHORT TERM GOAL #4   Title  Pt will perform TUG and TUG cognitive without LOB during turns, 2 of 3 trials.    Time  4    Period  Weeks    Status  Achieved         PT Long Term Goals - 02/27/18 8841      PT LONG TERM GOAL #1   Title  Pt/wife will verbalize understanding of fall prevention in home environment.  TARGET4/26/19    Time  6    Period  Weeks    Status  Achieved      PT LONG TERM GOAL #2   Title  Pt will perform at least 8 of 10 reps of sit<>stand transfers from lower than 18" surfaces, no posterior lean or LOB.    Baseline  4/26 met 10/10 reps    Time  6    Period  Weeks    Status  Achieved      PT LONG TERM GOAL #3   Title  Pt will report at least 50% improvement in bed mobility.    Baseline  4/26 pt reports no longer problems after recent review of strategies; 100% improved    Time  6    Period  Weeks    Status  Achieved      PT LONG TERM GOAL #4   Title  Pt/wife will report at least 50% improvement in transport wheelchair negotiation for night-time functional mobility.    Baseline  Pt reports 30-40% improvement in transport wheelchair negotiation    Time  6    Period  Weeks    Status  Partially Met      PT LONG TERM GOAL #5   Title  Pt/wife will verbalize strategies and cueing methods to assist in "off-time" in medication cycle for improved mobility.    Time  6    Period  Weeks    Status  Partially Met            Plan - 02/27/18 1511    Clinical Impression Statement  Pt has met LTG 1-3; LTG 4 and 5 partially met.  Pt has been extensively educated in tips and strategies to reduce freezing episodes with gait, including instruction with wife last visit in strategies that she can cue him at home; however, when PT attempts to review strategies, pt has answers that don't match what we have discussed.  Pt is able to demonstrate strategies with cueing  from PT.  Pt overall reports improved awareness of movement patterns, and appears appropriate for d/c at this time.    Rehab Potential  Good    PT Frequency  2x / week    PT Duration  6 weeks plus eval    PT Treatment/Interventions  ADLs/Self Care Home Management;DME  Instruction;Gait training;Stair training;Functional mobility training;Therapeutic activities;Therapeutic exercise;Balance training;Patient/family education;Neuromuscular re-education    PT Next Visit Plan  DC this visit.    Consulted and Agree with Plan of Care  Patient    Family Member Consulted          Patient will benefit from skilled therapeutic intervention in order to improve the following deficits and impairments:  Abnormal gait, Decreased balance, Decreased safety awareness, Decreased mobility, Difficulty walking, Impaired tone, Postural dysfunction  Visit Diagnosis: Other abnormalities of gait and mobility  Other symptoms and signs involving the nervous system     Problem List Patient Active Problem List   Diagnosis Date Noted  . Parkinson's disease ([place]) 05/04/2013    Ayrabella Labombard W. 02/27/2018, 3:15 PM  Frazier Butt., PT   [facility] [address] [place], [place], [address] Phone: [phone]   Fax:  [phone]  Name: Zachary [name] MRN: [id number] Date of Birth: Sep 13, 1951   PHYSICAL THERAPY DISCHARGE SUMMARY  Visits from Start of Care: 11  Current functional level related to goals / functional outcomes: PT Long Term Goals - 02/27/18 0816      PT LONG TERM GOAL #1   Title  Pt/wife will verbalize understanding of fall prevention in home environment.  TARGET4/26/19    Time  6    Period  Weeks    Status  Achieved      PT LONG TERM GOAL #2   Title  Pt will perform at least 8 of 10 reps of sit<>stand transfers from lower than 18" surfaces, no posterior lean or LOB.    Baseline  4/26 met 10/10 reps    Time  6    Period  Weeks    Status  Achieved      PT LONG TERM GOAL #3   Title  Pt will report at least 50% improvement in bed mobility.    Baseline  4/26 pt reports no longer problems after recent review of strategies; 100% improved    Time  6    Period  Weeks    Status  Achieved      PT  LONG TERM GOAL #4   Title  Pt/wife will report at least 50% improvement in transport wheelchair negotiation for night-time functional mobility.    Baseline  Pt reports 30-40% improvement in transport wheelchair negotiation    Time  6    Period  Weeks    Status  Partially Met      PT LONG TERM GOAL #5   Title  Pt/wife will verbalize strategies and cueing methods to assist in "off-time" in medication cycle for improved mobility.    Time  6    Period  Weeks    Status  Partially Met      Pt has met 3 of 5 long term goals; LTG 4 and 5 partially met.  Pt has been instructed in techniques for improved transport wheelchair negotiation with lower extremities at night and strategies to reduce freezing episodes with gait; however, he needs cues and reminders to slow down for optimal techniques.   Remaining deficits: Decreased timing and coordination of gait; hastening, freezing episodes  of gait   Education / Equipment: Pt has been educated in HEP, fall prevention, strategies to decrease freezing episodes with gait.  Plan: Patient agrees to discharge.  Patient goals were partially met. Patient is being discharged due to being pleased with the current functional level.  ?????Recommend return PT eval in 4-6 months due to progressive nature of disease process.    Mady Haagensen, PT 02/27/18 3:18 PM Phone: [phone] Fax: [phone]

## 2018-02-27 NOTE — Patient Instructions (Signed)

## 2018-03-03 ENCOUNTER — Other Ambulatory Visit: Payer: Self-pay | Admitting: Neurology

## 2018-03-03 DIAGNOSIS — R413 Other amnesia: Secondary | ICD-10-CM

## 2018-03-03 DIAGNOSIS — G20 Parkinson's disease: Secondary | ICD-10-CM

## 2018-03-04 ENCOUNTER — Ambulatory Visit: Payer: Medicare Other

## 2018-03-04 DIAGNOSIS — R471 Dysarthria and anarthria: Secondary | ICD-10-CM

## 2018-03-04 DIAGNOSIS — R2689 Other abnormalities of gait and mobility: Secondary | ICD-10-CM | POA: Diagnosis not present

## 2018-03-04 DIAGNOSIS — R41841 Cognitive communication deficit: Secondary | ICD-10-CM

## 2018-03-04 NOTE — Therapy (Signed)
[facility] [address] [place], [place], [address] Phone: [phone]   Fax:  [phone]  Speech Language Pathology Treatment  Patient Details  Name: JAWANZA ZAMBITO MRN: [id number] Date of Birth: Apr 16, 1951 Referring Provider: [name], DO   Encounter Date: 03/04/2018  End of Session - 03/04/18 2303    Visit Number  4    Number of Visits  17    Date for SLP Re-Evaluation  04/11/18    SLP Start Time  0850    SLP Stop Time   0930    SLP Time Calculation (min)  40 min    Activity Tolerance  Patient tolerated treatment well       Past Medical History:  Diagnosis Date  . Allergy   . Asthma   . Parkinson's disease ([facility])    Congenital R fourth nerve palsy    Past Surgical History:  Procedure Laterality Date  . CATARACT EXTRACTION, BILATERAL    . COLONOSCOPY    . SHOULDER SURGERY Left    lymph nodes  . TONSILLECTOMY    . VASECTOMY      There were no vitals filed for this visit.  Subjective Assessment - 03/04/18 0854    Subjective  "Well, life has gotten crazy, I did them (/a/) once a day."    Currently in Pain?  No/denies    Pain Score  3     Pain Location  Knee    Pain Orientation  Right    Pain Descriptors / Indicators  Sore;Shooting    Pain Type  Acute pain    Pain Onset  1 to 4 weeks ago    Pain Frequency  Intermittent    Aggravating Factors   putting pressure on it    Pain Relieving Factors  stretching            ADULT SLP TREATMENT - 03/04/18 0904      General Information   Behavior/Cognition  Alert;Cooperative;Pleasant mood      Treatment Provided   Treatment provided  Cognitive-Linquistic      Cognitive-Linquistic Treatment   Treatment focused on  Dysarthria    Skilled Treatment  Pt last meds 9AM. Pt entered room with <80dB loud /a/. SLP stressed the need to complete 5 reps twice daily for there to be muscle recalibration. Trained pt for full breath and incr'd strength of  voice (loudness) for /a/  - initial average 80dB, after training 89dB average. Pt read 3 sentence paragraphs with occasional min A req'd for loudness. SLP stressed necessary for pt to attend tx x2/week if he wanted to have best chance at success. Pt agreed.      Assessment / Recommendations / Plan   Plan  Continue with current plan of care      Progression Toward Goals   Progression toward goals  Progressing toward goals       SLP Education - 03/04/18 2302    Education provided  Yes    Education Details  full breath, loud /a/ every day BID    Person(s) Educated  Patient    Methods  Explanation    Comprehension  Verbalized understanding       SLP Short Term Goals - 03/04/18 2311      SLP SHORT TERM GOAL #1   Title  Pt will achieve average loud /a/ of average low 90sdB over 3 sessions    Time  3    Period  Weeks    Status  On-going      SLP SHORT TERM GOAL #2   Title  Pt will use abdominal breathing at rest 75% success over 2 sessions    Time  3    Period  Weeks    Status  On-going      SLP SHORT TERM GOAL #3   Title  Pt will use speech volume average low 70sdB when responding with sentences 18/20 over two sessions    Time  3    Period  Weeks    Status  On-going       SLP Long Term Goals - 03/04/18 2312      SLP LONG TERM GOAL #1   Title  Pt will generate loud /a/ with average of low 90s dB over 6 sessions    Time  7    Period  Weeks or 17 sessions    Status  On-going      SLP LONG TERM GOAL #2   Title  Pt will use abdominal breathing 70% of the time in 10 minutes simple conversation over three sessions    Time  7    Period  Weeks or 17 sessions    Status  On-going      SLP LONG TERM GOAL #3   Title  Pt will use speech volume average low 70sdB in 8 minutes of simple-mod complex conversation over three sessions    Time  7    Period  Weeks or 17 sessions    Status  On-going       Plan - 03/04/18 2304    Clinical Impression Statement  Average volume upon  entering ST room was again in mid 60s dB. SLP brought this up to pt afterwards and pt was surprised. Pt's loud /a/ was not heard as "uh". Pt to leave schedule at x2/week. Simple conversation remains below normal volume, continue skilled ST to maximize intelligiblity for improved independence and QOL.     Speech Therapy Frequency  2x / week    Duration  -- 8 weeks/17 sessions    Treatment/Interventions  Compensatory techniques;Functional tasks;SLP instruction and feedback;Environmental controls;Multimodal communcation approach;Internal/external aids;Patient/family education;Cognitive reorganization;Cueing hierarchy    Potential to Achieve Goals  Good    Potential Considerations  Ability to learn/carryover information       Patient will benefit from skilled therapeutic intervention in order to improve the following deficits and impairments:   Dysarthria and anarthria  Cognitive communication deficit    Problem List Patient Active Problem List   Diagnosis Date Noted  . Parkinson's disease ([facility]) 05/04/2013    [facility] ,MS, CCC-SLP  03/04/2018, 11:12 PM  [facility] [address] [place], [place], [address] Phone: [phone]   Fax:  [phone]   Name: DAWID DUPRIEST MRN: [id number] Date of Birth: 09/11/1951

## 2018-03-06 ENCOUNTER — Telehealth: Payer: Self-pay | Admitting: Neurology

## 2018-03-06 ENCOUNTER — Ambulatory Visit: Payer: Medicare Other

## 2018-03-06 DIAGNOSIS — R2689 Other abnormalities of gait and mobility: Secondary | ICD-10-CM | POA: Diagnosis not present

## 2018-03-06 DIAGNOSIS — R471 Dysarthria and anarthria: Secondary | ICD-10-CM

## 2018-03-06 DIAGNOSIS — R41841 Cognitive communication deficit: Secondary | ICD-10-CM

## 2018-03-06 NOTE — Patient Instructions (Signed)
  Please complete the assigned speech therapy homework prior to your next session and return it to the speech therapist at your next visit.  

## 2018-03-06 NOTE — Therapy (Signed)
[facility] [address] [place], [place], [address] Phone: [phone]   Fax:  [phone]  Speech Language Pathology Treatment  Patient Details  Name: Zachary [name] MRN: [id number] Date of Birth: [birth year] Referring Provider: [name], DO   Encounter Date: 03/06/2018  End of Session - 03/06/18 1149    Visit Number  5    Number of Visits  17    Date for SLP Re-Evaluation  04/11/18    SLP Start Time  0848    SLP Stop Time   0930    SLP Time Calculation (min)  42 min    Activity Tolerance  Patient tolerated treatment well       Past Medical History:  Diagnosis Date  . Allergy   . Asthma   . Parkinson's disease ([facility])    Congenital R fourth nerve palsy    Past Surgical History:  Procedure Laterality Date  . CATARACT EXTRACTION, BILATERAL    . COLONOSCOPY    . SHOULDER SURGERY Left    lymph nodes  . TONSILLECTOMY    . VASECTOMY      There were no vitals filed for this visit.  Subjective Assessment - 03/06/18 0848    Subjective  "I'm not sure if I'm hitting 80 (dB) but I'm doing them."    Currently in Pain?  No/denies            ADULT SLP TREATMENT - 03/06/18 0849      General Information   Behavior/Cognition  Alert;Cooperative;Pleasant mood      Treatment Provided   Treatment provided  Cognitive-Linquistic      Cognitive-Linquistic Treatment   Treatment focused on  Dysarthria    Skilled Treatment  Pt last meds 0630, and takes them 10 minutes into ST session today. Pt produced loud /a/ with average low 90s dB in order to recalibrate conversational loudness. In strucutred spontaneous tasks (sentence level) with usual mod A for loudness - average upper 60s dB-low 70s dB. When reading, SLP had to cue pt occasionally for adequate breath and loudness, faded to SBA for loudness and rare min A for breath. SLP stressed the need for practice routinely with louder speech, thus, pt needs to complete  any homework provided to him.      Assessment / Recommendations / Plan   Plan  Continue with current plan of care      Progression Toward Goals   Progression toward goals  Progressing toward goals       SLP Education - 03/06/18 1148    Education provided  Yes    Education Details  consistency in practice is essential for progress - homework completion is essential    Person(s) Educated  Patient    Methods  Explanation    Comprehension  Verbalized understanding;Need further instruction       SLP Short Term Goals - 03/06/18 1151      SLP SHORT TERM GOAL #1   Title  Pt will achieve average loud /a/ of average low 90sdB over 3 sessions    Time  3    Period  Weeks    Status  On-going      SLP SHORT TERM GOAL #2   Title  Pt will use abdominal breathing at rest 75% success over 2 sessions    Time  3    Period  Weeks    Status  On-going      SLP SHORT TERM GOAL #3   Title  Pt will use speech volume average low 70sdB when responding with sentences 18/20 over two sessions    Time  3    Period  Weeks    Status  On-going       SLP Long Term Goals - 03/06/18 1151      SLP LONG TERM GOAL #1   Title  Pt will generate loud /a/ with average of low 90s dB over 6 sessions    Time  7    Period  Weeks or 17 sessions    Status  On-going      SLP LONG TERM GOAL #2   Title  Pt will use abdominal breathing 70% of the time in 10 minutes simple conversation over three sessions    Time  7    Period  Weeks or 17 sessions    Status  On-going      SLP LONG TERM GOAL #3   Title  Pt will use speech volume average low 70sdB in 8 minutes of simple-mod complex conversation over three sessions    Time  7    Period  Weeks or 17 sessions    Status  On-going       Plan - 03/06/18 1149    Clinical Impression Statement  Average volume upon entering ST room was again in mid 60s dB. SLP brought this up to pt afterwards and pt was surprised. Pt's loud /a/ was not heard as "uh". Pt to leave  schedule at x2/week. Simple conversation remains below normal volume, continue skilled ST to maximize intelligiblity for improved independence and QOL.     Speech Therapy Frequency  2x / week    Duration  -- 8 weeks/17 sessions    Treatment/Interventions  Compensatory techniques;Functional tasks;SLP instruction and feedback;Environmental controls;Multimodal communcation approach;Internal/external aids;Patient/family education;Cognitive reorganization;Cueing hierarchy    Potential to Achieve Goals  Good    Potential Considerations  Ability to learn/carryover information       Patient will benefit from skilled therapeutic intervention in order to improve the following deficits and impairments:   Dysarthria and anarthria  Cognitive communication deficit    Problem List Patient Active Problem List   Diagnosis Date Noted  . Parkinson's disease ([facility]) 05/04/2013    [facility] ,MS, CCC-SLP  03/06/2018, 11:52 AM  [facility] [address] [place], [place], [address] Phone: [phone]   Fax:  [phone]   Name: Zachary [name] MRN: [id number] Date of Birth: [birth year]

## 2018-03-06 NOTE — Telephone Encounter (Signed)
Received note from Inbrija that patient's specialty pharmacy is [facility]. Ph. [phone]. Fax. [phone].

## 2018-03-11 ENCOUNTER — Ambulatory Visit: Payer: Medicare Other

## 2018-03-13 ENCOUNTER — Ambulatory Visit: Payer: Medicare Other

## 2018-03-13 DIAGNOSIS — R471 Dysarthria and anarthria: Secondary | ICD-10-CM

## 2018-03-13 DIAGNOSIS — R2689 Other abnormalities of gait and mobility: Secondary | ICD-10-CM | POA: Diagnosis not present

## 2018-03-13 DIAGNOSIS — R41841 Cognitive communication deficit: Secondary | ICD-10-CM

## 2018-03-13 NOTE — Patient Instructions (Addendum)
   You and your wife should look at this resource from the [employer]:  OMesothelioma.fr  --the link to the book  You MUST do your loud "ah"s to give yourself best opportunity for progress.

## 2018-03-13 NOTE — Therapy (Signed)
[facility] [address] [place], [place], [address] Phone: [phone]   Fax:  [phone]  Speech Language Pathology Treatment  Patient Details  Name: Zachary [name] MRN: [id number] Date of Birth: 05-31-1951 Referring Provider: [name], DO   Encounter Date: 03/13/2018  End of Session - 03/13/18 1356    Visit Number  6    Number of Visits  17    Date for SLP Re-Evaluation  04/11/18    SLP Start Time  0848    SLP Stop Time   0930    SLP Time Calculation (min)  42 min    Activity Tolerance  Patient tolerated treatment well       Past Medical History:  Diagnosis Date  . Allergy   . Asthma   . Parkinson's disease (HCC)    Congenital R fourth nerve palsy    Past Surgical History:  Procedure Laterality Date  . CATARACT EXTRACTION, BILATERAL    . COLONOSCOPY    . SHOULDER SURGERY Left    lymph nodes  . TONSILLECTOMY    . VASECTOMY      There were no vitals filed for this visit.  Subjective Assessment - 03/13/18 0905    Subjective  "I haven't been religious about them, no." (pt, re: has he been doing his loud /a/ at home)    Currently in Pain?  No/denies            ADULT SLP TREATMENT - 03/13/18 0906      General Information   Behavior/Cognition  Alert;Cooperative;Pleasant mood      Treatment Provided   Treatment provided  Cognitive-Linquistic      Cognitive-Linquistic Treatment   Treatment focused on  Dysarthria    Skilled Treatment  Pt told SLP he has not done loud "ah" as directed. SLP reminded pt that if he does not complete loud /a/ he cannot expect much progress in the way of increased loudness in conversation. SLP reminded pt that loud /a/ only takes <90 seconds, and it is crucial to obtaining and maintaining WNL conversational volume. Today, pt averaged in upper 80s dB and req'd consistent cues for full breath. In simple word responses, pt maintained WNL volume 90%; responses requiring  incr'd cognitive load consistently decr'd pt's loudness. Pt is aware of decr'd loudness due to decr'd breath support but still requires cues to take another breath instead of talk on residual volume.       Assessment / Recommendations / Plan   Plan  Continue with current plan of care       SLP Education - 03/13/18 1356    Education provided  Yes    Education Details  "ah" crucial for progress    Person(s) Educated  Patient    Methods  Explanation    Comprehension  Verbalized understanding       SLP Short Term Goals - 03/13/18 1358      SLP SHORT TERM GOAL #1   Title  Pt will achieve average loud /a/ of average low 90sdB over 3 sessions    Time  2    Period  Weeks    Status  On-going      SLP SHORT TERM GOAL #2   Title  Pt will use abdominal breathing at rest 75% success over 2 sessions    Time  2    Period  Weeks    Status  On-going      SLP SHORT TERM GOAL #3  Title  Pt will use speech volume average low 70sdB when responding with sentences 18/20 over two sessions    Time  2    Period  Weeks    Status  On-going       SLP Long Term Goals - 03/13/18 1358      SLP LONG TERM GOAL #1   Title  Pt will generate loud /a/ with average of low 90s dB over 6 sessions    Time  6    Period  Weeks or 17 sessions    Status  On-going      SLP LONG TERM GOAL #2   Title  Pt will use abdominal breathing 70% of the time in 10 minutes simple conversation over three sessions    Time  6    Period  Weeks or 17 sessions    Status  On-going      SLP LONG TERM GOAL #3   Title  Pt will use speech volume average low 70sdB in 8 minutes of simple-mod complex conversation over three sessions    Time  6    Period  Weeks or 17 sessions    Status  On-going       Plan - 03/13/18 1356    Clinical Impression Statement  Average volume upon entering ST room was again in mid 60s dB. He admitted to noncompliance with "ah"s. SLP told pt that progress that is sustained in ST is dependent upon loud  "ah". See "skilled intervention" for details. Simple conversation remains below normal volume, continue skilled ST to maximize intelligiblity for improved independence and QOL.     Speech Therapy Frequency  2x / week    Duration  -- 8 weeks/17 sessions    Treatment/Interventions  Compensatory techniques;Functional tasks;SLP instruction and feedback;Environmental controls;Multimodal communcation approach;Internal/external aids;Patient/family education;Cognitive reorganization;Cueing hierarchy    Potential to Achieve Goals  Good    Potential Considerations  Ability to learn/carryover information       Patient will benefit from skilled therapeutic intervention in order to improve the following deficits and impairments:   Dysarthria and anarthria  Cognitive communication deficit    Problem List Patient Active Problem List   Diagnosis Date Noted  . Parkinson's disease (HCC) 05/04/2013    [facility] ,MS, CCC-SLP  03/13/2018, 1:59 PM  [facility] [address] [place], [place], [address] Phone: [phone]   Fax:  [phone]   Name: Zachary [name] MRN: [id number] Date of Birth: 29-Jul-1951

## 2018-03-18 ENCOUNTER — Ambulatory Visit: Payer: Medicare Other

## 2018-03-18 DIAGNOSIS — R41841 Cognitive communication deficit: Secondary | ICD-10-CM

## 2018-03-18 DIAGNOSIS — R2689 Other abnormalities of gait and mobility: Secondary | ICD-10-CM | POA: Diagnosis not present

## 2018-03-18 DIAGNOSIS — R471 Dysarthria and anarthria: Secondary | ICD-10-CM

## 2018-03-18 NOTE — Therapy (Signed)
[facility] [address] [place], [place], [address] Phone: [phone]   Fax:  [phone]  Speech Language Pathology Treatment  Patient Details  Name: Zachary [name] MRN: [id number] Date of Birth: 1951-04-06 Referring Provider: [name], DO   Encounter Date: 03/18/2018  End of Session - 03/18/18 0934    Visit Number  7    Number of Visits  17    Date for SLP Re-Evaluation  04/11/18    SLP Start Time  0848    SLP Stop Time   0930    SLP Time Calculation (min)  42 min    Activity Tolerance  Patient tolerated treatment well       Past Medical History:  Diagnosis Date  . Allergy   . Asthma   . Parkinson's disease (HCC)    Congenital R fourth nerve palsy    Past Surgical History:  Procedure Laterality Date  . CATARACT EXTRACTION, BILATERAL    . COLONOSCOPY    . SHOULDER SURGERY Left    lymph nodes  . TONSILLECTOMY    . VASECTOMY      There were no vitals filed for this visit.  Subjective Assessment - 03/18/18 0850    Subjective  SLP gave pt a postcard for Parkinson's Symposium in July 2019.    Currently in Pain?  No/denies            ADULT SLP TREATMENT - 03/18/18 0851      General Information   Behavior/Cognition  Alert;Cooperative;Pleasant mood      Treatment Provided   Treatment provided  Cognitive-Linquistic      Cognitive-Linquistic Treatment   Treatment focused on  Dysarthria    Skilled Treatment  Pt's medication reminder went off during session - pt took his med at [phone] after setting it on the table at 0851. "I've hit more than less" (pt, re: consistency with loud /a/). Loud /a/ used to recalibrate pt's conversational speech volume; average upper 80s-low 90s dB. In conversation following loud /a/ pt produced volume in upper 60s average however with reduced breath control. In spontaneous sentence task (picture description) pt had more difficulty providing a feasible definition which  decreased his overall performance with loudness average upper 60s dB and varied success with breath support. Pt endorsed that "I wasn't thinking about my loudness at all. My mental faculties were going towards thinking of how to describe the pictures." SLP used this opportunity to encourage pt to do his homework.       Assessment / Recommendations / Plan   Plan  Continue with current plan of care      Progression Toward Goals   Progression toward goals  Progressing toward goals       SLP Education - 03/18/18 0934    Education provided  Yes    Education Details  necessary to complete homework and do "ah" for max benefit from ST    Person(s) Educated  Patient    Methods  Explanation    Comprehension  Verbalized understanding       SLP Short Term Goals - 03/18/18 1736      SLP SHORT TERM GOAL #1   Title  Pt will achieve average loud /a/ of average low 90sdB over 3 sessions    Time  1    Period  Weeks    Status  On-going      SLP SHORT TERM GOAL #2   Title  Pt will use abdominal breathing at  rest 75% success over 2 sessions    Time  1    Period  Weeks    Status  On-going      SLP SHORT TERM GOAL #3   Title  Pt will use speech volume average low 70sdB when responding with sentences 18/20 over two sessions    Time  1    Period  Weeks    Status  On-going       SLP Long Term Goals - 03/18/18 1736      SLP LONG TERM GOAL #1   Title  Pt will generate loud /a/ with average of low 90s dB over 6 sessions    Time  5    Period  Weeks or 17 sessions    Status  On-going      SLP LONG TERM GOAL #2   Title  Pt will use abdominal breathing 70% of the time in 10 minutes simple conversation over three sessions    Time  5    Period  Weeks or 17 sessions    Status  On-going      SLP LONG TERM GOAL #3   Title  Pt will use speech volume average low 70sdB in 8 minutes of simple-mod complex conversation over three sessions    Time  5    Period  Weeks or 17 sessions    Status  On-going        Plan - 03/18/18 0934    Clinical Impression Statement  Average volume upon entering ST room - mid 60s dB. After loud /a/ incr'd to upper 60s in simple conversation. Loud /a/ frequency was BID. SLP told pt that progress that is sustained in ST is dependent upon loud "ah" and completion of homework. See "skilled intervention" for details. Simple conversation remains below normal volume, continue skilled ST to maximize intelligiblity for improved independence and QOL.     Speech Therapy Frequency  2x / week    Duration  -- 8 weeks/17 sessions    Treatment/Interventions  Compensatory techniques;Functional tasks;SLP instruction and feedback;Environmental controls;Multimodal communcation approach;Internal/external aids;Patient/family education;Cognitive reorganization;Cueing hierarchy    Potential to Achieve Goals  Good    Potential Considerations  Ability to learn/carryover information       Patient will benefit from skilled therapeutic intervention in order to improve the following deficits and impairments:   Dysarthria and anarthria  Cognitive communication deficit    Problem List Patient Active Problem List   Diagnosis Date Noted  . Parkinson's disease (HCC) 05/04/2013    [facility] ,MS, CCC-SLP  03/18/2018, 5:37 PM  [facility] [address] [place], [place], [address] Phone: [phone]   Fax:  [phone]   Name: Zachary [name] MRN: [id number] Date of Birth: 22-Mar-1951

## 2018-03-18 NOTE — Patient Instructions (Signed)
  Please complete the assigned speech therapy homework prior to your next session and return it to the speech therapist at your next visit.  

## 2018-03-20 ENCOUNTER — Ambulatory Visit: Payer: Medicare Other

## 2018-03-20 DIAGNOSIS — R41841 Cognitive communication deficit: Secondary | ICD-10-CM

## 2018-03-20 DIAGNOSIS — R2689 Other abnormalities of gait and mobility: Secondary | ICD-10-CM | POA: Diagnosis not present

## 2018-03-20 DIAGNOSIS — R471 Dysarthria and anarthria: Secondary | ICD-10-CM

## 2018-03-20 NOTE — Therapy (Signed)
[facility] [address] [place], [place], [address] Phone: [phone]   Fax:  [phone]  Speech Language Pathology Treatment  Patient Details  Name: Zachary [name] MRN: [id number] Date of Birth: [birth year] Referring Provider: Alonza Bogus, DO   Encounter Date: 03/20/2018  End of Session - 03/20/18 0928    Visit Number  8    Number of Visits  17    Date for SLP Re-Evaluation  04/11/18    SLP Start Time  0847    SLP Stop Time   0930    SLP Time Calculation (min)  43 min    Activity Tolerance  Patient tolerated treatment well       Past Medical History:  Diagnosis Date  . Allergy   . Asthma   . Parkinson's disease ([place])    Congenital R fourth nerve palsy    Past Surgical History:  Procedure Laterality Date  . CATARACT EXTRACTION, BILATERAL    . COLONOSCOPY    . SHOULDER SURGERY Left    lymph nodes  . TONSILLECTOMY    . VASECTOMY      There were no vitals filed for this visit.  Subjective Assessment - 03/20/18 0850    Subjective  Pt told SLP that his wife's son died two weeks ago and it has been emotionally hard.    Currently in Pain?  No/denies            ADULT SLP TREATMENT - 03/20/18 0908      General Information   Behavior/Cognition  Alert;Cooperative;Pleasant mood      Treatment Provided   Treatment provided  Cognitive-Linquistic      Cognitive-Linquistic Treatment   Treatment focused on  Dysarthria    Skilled Treatment  SLP used pt's loud /a/ as a component of recalibration to more WNL speech volume. Average 95dB with coughing after each rep. SLP worked with shaping pt's /a/ for more abdominal support with minimal success today. Pt incr'd his average volume to WNL in simple responses (making sentence from semantically related words) not requiring incr in cognitive load to WNL x23 responses, with usual min-mod A for proper breath support. In simple conversation between reps/tasks, pt  appeared to increase volume with cont'd difficulty with breath support.       Assessment / Recommendations / Plan   Plan  Continue with current plan of care      Progression Toward Goals   Progression toward goals  Goals downgraded         SLP Short Term Goals - 03/20/18 0933      SLP SHORT TERM GOAL #1   Title  Pt will achieve average loud /a/ of average low 90sdB over 3 sessions    Status  Partially Met      SLP SHORT TERM GOAL #2   Title  Pt will use abdominal breathing at rest 75% success over 2 sessions    Status  Partially Met      SLP SHORT TERM GOAL #3   Title  Pt will use speech volume average low 70sdB when responding with sentences 18/20 over two sessions    Status  Partially Met       SLP Long Term Goals - 03/20/18 0934      SLP LONG TERM GOAL #1   Title  Pt will generate loud /a/ with average of low 90s dB over 6 sessions    Baseline  03-20-18    Time  5  Period  Weeks or 17 sessions    Status  On-going      SLP LONG TERM GOAL #2   Title  Pt will use abdominal breathing 70% of the time in 10 minutes simple conversation over three sessions    Time  5    Period  Weeks or 17 sessions    Status  On-going      SLP LONG TERM GOAL #3   Title  Pt will use speech volume average low 70sdB in 10 minutes of simple complex conversation over three sessions    Time  5    Period  Weeks or 17 sessions    Status  Revised       Plan - 03/20/18 0929    Clinical Impression Statement  SLP considering formal cognitive testing in next few sessions to target cognitive linguistic deficits. Pt's volume upon entering ST room - again averaging mid 60s dB. After loud /a/ and practice requiring responses with limited cognitive load, pt spontaneously incr'd volume to upper 60s-low 70s SLP again reminded pt that progress that is sustained in ST is dependent upon loud "ah" and completion of homework. See "skilled intervention" for details. Pt requires continued skilled ST to maximize  intelligiblity for improved independence and QOL.     Speech Therapy Frequency  2x / week    Duration  -- 8 weeks/17 sessions    Treatment/Interventions  Compensatory techniques;Functional tasks;SLP instruction and feedback;Environmental controls;Multimodal communcation approach;Internal/external aids;Patient/family education;Cognitive reorganization;Cueing hierarchy    Potential to Achieve Goals  Good    Potential Considerations  Ability to learn/carryover information       Patient will benefit from skilled therapeutic intervention in order to improve the following deficits and impairments:   Dysarthria and anarthria  Cognitive communication deficit    Problem List Patient Active Problem List   Diagnosis Date Noted  . Parkinson's disease ([place]) 05/04/2013    [facility] ,[place], [place]  03/20/2018, 9:36 AM  [place] [facility] [address] [place], [place], [address] Phone: [phone]   Fax:  [phone]   Name: Zachary [name] MRN: [id number] Date of Birth: 12/25/1950

## 2018-03-20 NOTE — Patient Instructions (Signed)
  Please complete the assigned speech therapy homework prior to your next session and return it to the speech therapist at your next visit.  

## 2018-03-25 ENCOUNTER — Ambulatory Visit: Payer: Medicare Other

## 2018-03-25 DIAGNOSIS — R2689 Other abnormalities of gait and mobility: Secondary | ICD-10-CM | POA: Diagnosis not present

## 2018-03-25 DIAGNOSIS — R41841 Cognitive communication deficit: Secondary | ICD-10-CM

## 2018-03-25 DIAGNOSIS — R471 Dysarthria and anarthria: Secondary | ICD-10-CM

## 2018-03-25 NOTE — Therapy (Signed)
[facility] [address] [place], [place], [address] Phone: [phone]   Fax:  [phone]  Speech Language Pathology Treatment  Patient Details  Name: Zachary [name] MRN: [id number] Date of Birth: 07-04-1951 Referring Provider: Alonza Bogus, DO   Encounter Date: 03/25/2018  End of Session - 03/25/18 1629    Visit Number  9    Number of Visits  17    Date for SLP Re-Evaluation  04/11/18    SLP Start Time  1532    SLP Stop Time   8185    SLP Time Calculation (min)  43 min    Activity Tolerance  Patient tolerated treatment well       Past Medical History:  Diagnosis Date  . Allergy   . Asthma   . Parkinson's disease ([place])    Congenital R fourth nerve palsy    Past Surgical History:  Procedure Laterality Date  . CATARACT EXTRACTION, BILATERAL    . COLONOSCOPY    . SHOULDER SURGERY Left    lymph nodes  . TONSILLECTOMY    . VASECTOMY      There were no vitals filed for this visit.  Subjective Assessment - 03/25/18 1535    Subjective  Pt entered with sub 70dB conversational speech.     Currently in Pain?  No/denies            ADULT SLP TREATMENT - 03/25/18 1538      General Information   Behavior/Cognition  Alert;Cooperative;Pleasant mood      Treatment Provided   Treatment provided  Cognitive-Linquistic      Cognitive-Linquistic Treatment   Treatment focused on  Dysarthria    Skilled Treatment  SLP used pt's loud /a/ as a component of recalibration to more WNL speech volume. Average in the low 90s dB with coughing after 2/5 reps. SLP reminded pt to make sure to feel push abdominally and not in his thyroid area, In sentence responses requring min-mod cognitive load, pt's loudness suffered greatly (including breath support), averaged mid 60s dB. In sentence responses requiring little cognitive load, pt maintained average WNL loud speech. Pt stated he was practicing his louder speech in  conversation with his wife. SLP reiterated that while practicing loudness in conversation was not harmful, it was likely not very efficacious as pt was struggling keeping WNL loudness in structured tasks with min-mod cognitive load. SLP urged pt to practice 30 minutes/day with handouts provided by SLPs, and that if he ran out of material he could repeat some tasks from previous homework provided by SLP.       Assessment / Recommendations / Plan   Plan  Continue with current plan of care      Progression Toward Goals   Progression toward goals  Progressing toward goals       SLP Education - 03/25/18 1623    Education provided  Yes    Education Details  necessary to do 30 minutes handouts practice each night    Person(s) Educated  Patient    Methods  Explanation    Comprehension  Verbalized understanding;Need further instruction       SLP Short Term Goals - 03/20/18 0933      SLP SHORT TERM GOAL #1   Title  Pt will achieve average loud /a/ of average low 90sdB over 3 sessions    Status  Partially Met      SLP SHORT TERM GOAL #2   Title  Pt will use  abdominal breathing at rest 75% success over 2 sessions    Status  Partially Met      SLP SHORT TERM GOAL #3   Title  Pt will use speech volume average low 70sdB when responding with sentences 18/20 over two sessions    Status  Partially Met       SLP Long Term Goals - 03/25/18 1627      SLP LONG TERM GOAL #1   Title  Pt will generate loud /a/ with average of low 90s dB over 6 sessions    Baseline  03-20-18, 03-25-18    Time  4    Period  Weeks or 17 sessions    Status  On-going      SLP LONG TERM GOAL #2   Title  Pt will use abdominal breathing 70% of the time in 10 minutes simple conversation over three sessions    Period  -- or 17 sessions    Status  Deferred for focus on LOUD speech      SLP LONG TERM GOAL #3   Title  Pt will use speech volume average low 70sdB in 10 minutes of simple complex conversation over three  sessions    Time  4    Period  Weeks or 17 sessions    Status  On-going       Plan - 03/25/18 1623    Clinical Impression Statement  SLP considering formal cognitive testing in next few sessions to target cognitive linguistic deficits. Pt's volume upon entering ST room averaging mid 60s dB. Sentences with min-mod cognitive load proved troublesome for pt to produce speech with WNL volume; min cognitive load was necessary for pt to generate WNL volume in sentence responses. See "skilled intervention" for details. Pt requires continued skilled ST to maximize intelligiblity for improved independence and QOL.     Speech Therapy Frequency  2x / week    Duration  -- 8 weeks/17 sessions    Treatment/Interventions  Compensatory techniques;Functional tasks;SLP instruction and feedback;Environmental controls;Multimodal communcation approach;Internal/external aids;Patient/family education;Cognitive reorganization;Cueing hierarchy    Potential to Achieve Goals  Fair    Potential Considerations  Ability to learn/carryover information;Severity of impairments       Patient will benefit from skilled therapeutic intervention in order to improve the following deficits and impairments:   Dysarthria and anarthria  Cognitive communication deficit    Problem List Patient Active Problem List   Diagnosis Date Noted  . Parkinson's disease ([place]) 05/04/2013    [facility] ,[place], [place]  03/25/2018, 4:30 PM  [place] [facility] [address] [place], [place], [address] Phone: [phone]   Fax:  [phone]   Name: Zachary [name] MRN: [id number] Date of Birth: Mar 09, 1951

## 2018-03-25 NOTE — Patient Instructions (Addendum)
  Please complete the assigned speech therapy homework prior to your next session and return it to the speech therapist at your next visit.  

## 2018-03-27 ENCOUNTER — Encounter: Payer: Self-pay | Admitting: Speech Pathology

## 2018-03-27 ENCOUNTER — Ambulatory Visit: Payer: Medicare Other | Admitting: Speech Pathology

## 2018-03-27 DIAGNOSIS — R2689 Other abnormalities of gait and mobility: Secondary | ICD-10-CM | POA: Diagnosis not present

## 2018-03-27 DIAGNOSIS — R471 Dysarthria and anarthria: Secondary | ICD-10-CM

## 2018-03-27 DIAGNOSIS — R41841 Cognitive communication deficit: Secondary | ICD-10-CM

## 2018-03-27 NOTE — Therapy (Signed)
[facility] [address] [place], [place], [address] Phone: [phone]   Fax:  [phone]  Speech Language Pathology Treatment  Patient Details  Name: Zachary [name] MRN: [id number] Date of Birth: 04-Sep-1951 Referring Provider: Alonza Bogus, DO   Encounter Date: 03/27/2018  End of Session - 03/27/18 0929    Visit Number  10    Number of Visits  17    Date for SLP Re-Evaluation  04/11/18    SLP Start Time  0846    SLP Stop Time   0929    SLP Time Calculation (min)  43 min       Past Medical History:  Diagnosis Date  . Allergy   . Asthma   . Parkinson's disease ([place])    Congenital R fourth nerve palsy    Past Surgical History:  Procedure Laterality Date  . CATARACT EXTRACTION, BILATERAL    . COLONOSCOPY    . SHOULDER SURGERY Left    lymph nodes  . TONSILLECTOMY    . VASECTOMY      There were no vitals filed for this visit.  Subjective Assessment - 03/27/18 0855    Subjective  "I'm not talking as loud as I should, I'm swallowing my words"    Currently in Pain?  No/denies            ADULT SLP TREATMENT - 03/27/18 0902      General Information   Behavior/Cognition  Alert;Cooperative;Pleasant mood      Treatment Provided   Treatment provided  Cognitive-Linquistic      Cognitive-Linquistic Treatment   Treatment focused on  Dysarthria    Skilled Treatment  Loud /a/ to recalibrate volume average 90dB (88 to 94dB) Reviewed abdonimal breathing with min A. Pt read common phrases with average of 70dB 18/20, with occasional min A for breath support and volume. In structured speech task, naming pictures and stating which doesn't belong and why with average of 73dB on simple naming, however average of 68dB on explanation with frequent volume decay to 67dB. Simple conversation with average of 68dB with visual and verbal cues for volume      Assessment / Recommendations / Plan   Plan  Continue with  current plan of care      Progression Toward Goals   Progression toward goals  Progressing toward goals         SLP Short Term Goals - 03/27/18 6269      SLP SHORT TERM GOAL #1   Title  Pt will achieve average loud /a/ of average low 90sdB over 3 sessions    Status  Partially Met      SLP SHORT TERM GOAL #2   Title  Pt will use abdominal breathing at rest 75% success over 2 sessions    Status  Partially Met      SLP SHORT TERM GOAL #3   Title  Pt will use speech volume average low 70sdB when responding with sentences 18/20 over two sessions    Status  Partially Met       SLP Long Term Goals - 03/27/18 0929      SLP LONG TERM GOAL #1   Title  Pt will generate loud /a/ with average of low 90s dB over 6 sessions    Baseline  03-20-18, 03-25-18; 03/27/18    Time  4    Period  Weeks or 17 sessions    Status  On-going      SLP  LONG TERM GOAL #2   Title  Pt will use abdominal breathing 70% of the time in 10 minutes simple conversation over three sessions    Period  -- or 17 sessions    Status  Deferred for focus on LOUD speech      SLP LONG TERM GOAL #3   Title  Pt will use speech volume average low 70sdB in 10 minutes of simple complex conversation over three sessions    Time  4    Period  Weeks or 17 sessions    Status  On-going       Plan - 03/27/18 5945    Clinical Impression Statement  SLP considering formal cognitive testing in next few sessions to target cognitive linguistic deficits. Pt's volume upon entering ST room averaging mid 60s dB. Sentences with min-mod cognitive load proved troublesome for pt to produce speech with WNL volume; min cognitive load was necessary for pt to generate WNL volume in sentence responses. See "skilled intervention" for details. Pt requires continued skilled ST to maximize intelligiblity for improved independence and QOL.     Speech Therapy Frequency  2x / week    Treatment/Interventions  Compensatory techniques;Functional tasks;SLP  instruction and feedback;Environmental controls;Multimodal communcation approach;Internal/external aids;Patient/family education;Cognitive reorganization;Cueing hierarchy    Potential to Achieve Goals  Fair    Potential Considerations  Ability to learn/carryover information;Severity of impairments       Patient will benefit from skilled therapeutic intervention in order to improve the following deficits and impairments:   Dysarthria and anarthria  Cognitive communication deficit    Problem List Patient Active Problem List   Diagnosis Date Noted  . Parkinson's disease ([place]) 05/04/2013    Yer Castello, Annye Rusk MS, CCC-SLP 03/27/2018, 9:29 AM  [facility] [address] [place], [place], [address] Phone: [phone]   Fax:  [phone]   Name: WALID HAIG MRN: [id number] Date of Birth: [birth year]

## 2018-04-01 ENCOUNTER — Ambulatory Visit: Payer: Medicare Other | Attending: Neurology

## 2018-04-01 ENCOUNTER — Other Ambulatory Visit: Payer: Self-pay | Admitting: Neurology

## 2018-04-01 DIAGNOSIS — R41841 Cognitive communication deficit: Secondary | ICD-10-CM | POA: Diagnosis present

## 2018-04-01 DIAGNOSIS — G20 Parkinson's disease: Secondary | ICD-10-CM

## 2018-04-01 DIAGNOSIS — R471 Dysarthria and anarthria: Secondary | ICD-10-CM | POA: Diagnosis not present

## 2018-04-01 NOTE — Therapy (Signed)
Zachary [name] [address] [place], [place], [address] Phone: [phone]   Fax:  [phone]  Speech Language Pathology Treatment/ Cognitive Assessment  Patient Details  Name: Zachary [name] MRN: [id number] Date of Birth: 11/28/1950 Referring Provider: Alonza Bogus, DO   Encounter Date: 04/01/2018  End of Session - 04/01/18 1640    Visit Number  11    Number of Visits  17    Date for SLP Re-Evaluation  04/11/18    SLP Start Time  0848    SLP Stop Time   0930    SLP Time Calculation (min)  42 min    Activity Tolerance  Patient tolerated treatment well       Past Medical History:  Diagnosis Date  . Allergy   . Asthma   . Parkinson's disease ([place])    Congenital R fourth nerve palsy    Past Surgical History:  Procedure Laterality Date  . CATARACT EXTRACTION, BILATERAL    . COLONOSCOPY    . SHOULDER SURGERY Left    lymph nodes  . TONSILLECTOMY    . VASECTOMY      There were no vitals filed for this visit.  Subjective Assessment - 04/01/18 0858    Subjective  "What kind of thinking skills?" (pt, re: cognitive linguistic testing today)    Currently in Pain?  No/denies            ADULT SLP TREATMENT - 04/01/18 0859      General Information   Behavior/Cognition  Impulsive;Cooperative;Alert;Pleasant mood      Treatment Provided   Treatment provided  Cognitive-Linquistic      Cognitive-Linquistic Treatment   Treatment focused on  Cognition    Skilled Treatment  SLP used standardized cognitive testing today to obtain measurement of pt's cognitive linguistic skills. Impulsivity decr'd pt's accuracy with some tasks, in general. Pt's awareness of his errors was diminished when compared to normal. Results: Attention-Mod deficit; memory-mild deficit, executive function-mild deficit, WNL language, visuospatial skills-mild deficit, clock drawing WNL. Subjectively, on pt's clock drawing pt drew all numbers  outside the circle without awareness of his error, and pt attempted to explain away errors ("I think I've seen a clock like that before."). Additionally, pt DID in fact endorse his cognitive deficit in telling SLP that his symbol trails was deficient, communicating to SLP that his wife and he have conflict becuase of his cognitive deficit at times.       Assessment / Recommendations / Plan   Plan  Goals updated      Progression Toward Goals   Progression toward goals  -- goal/s added for cognitive linguistics       SLP Education - 04/01/18 1639    Education provided  Yes    Education Details  eval results, possible cognitive linguistic goals    Person(s) Educated  Patient    Methods  Explanation    Comprehension  Verbalized understanding;Need further instruction       SLP Short Term Goals - 03/27/18 0928      SLP SHORT TERM GOAL #1   Title  Pt will achieve average loud /a/ of average low 90sdB over 3 sessions    Status  Partially Met      SLP SHORT TERM GOAL #2   Title  Pt will use abdominal breathing at rest 75% success over 2 sessions    Status  Partially Met      SLP SHORT TERM GOAL #3   Title  Pt will use speech volume average low 70sdB when responding with sentences 18/20 over two sessions    Status  Partially Met       SLP Long Term Goals - 04/01/18 1644      SLP LONG TERM GOAL #1   Title  Pt will generate loud /a/ with average of low 90s dB over 6 sessions    Baseline  03-20-18, 03-25-18; 03/27/18    Time  3    Period  Weeks or 17 sessions    Status  On-going      SLP LONG TERM GOAL #2   Title  Pt will use abdominal breathing 70% of the time in 10 minutes simple conversation over three sessions    Period  -- or 17 sessions    Status  Deferred for focus on LOUD speech      SLP LONG TERM GOAL #3   Title  Pt will use speech volume average low 70sdB in 10 minutes of simple complex conversation over three sessions    Time  3    Period  Weeks or 17 sessions     Status  On-going      SLP LONG TERM GOAL #4   Title  in therapy tasks, pt will demo compensatory strategies for attention/awareness (asking for assistance, writing down reminders, etc) with occasional min A to use them    Time  3    Period  Weeks    Status  New       Plan - 04/01/18 1640    Clinical Impression Statement  Today formal cognitive testing revealed some cognitive linguistic deficits in areas of attention, awareness, problem solving, executive function, adn visuospatial skills. Goal/s were added. Pt's impulsivity will make progress in these areas challenging - therapy may focus on compensatory strategies. See "skilled treatment" for details. Pt requires continued skilled ST to maximize intelligiblity, and maximize cognitive linguistic skills for improved independence and QOL. It may be necessary to do some caregiver training as well.     Speech Therapy Frequency  2x / week    Duration  -- 8 weeks or 17 sessions    Treatment/Interventions  Compensatory techniques;Functional tasks;SLP instruction and feedback;Environmental controls;Multimodal communcation approach;Internal/external aids;Patient/family education;Cognitive reorganization;Cueing hierarchy    Potential to Achieve Goals  Fair    Potential Considerations  Ability to learn/carryover information;Severity of impairments       Patient will benefit from skilled therapeutic intervention in order to improve the following deficits and impairments:   Dysarthria and anarthria  Cognitive communication deficit    Problem List Patient Active Problem List   Diagnosis Date Noted  . Parkinson's disease ([place]) 05/04/2013    [facility] ,[place], [place]  04/01/2018, 4:47 PM  [facility] [address] [place], [place], [address] Phone: [phone]   Fax:  [phone]   Name: Zachary [name] MRN: [id number] Date of Birth: 01/10/1951

## 2018-04-01 NOTE — Patient Instructions (Signed)
We will work a little on your cognitive language difficulties.

## 2018-04-03 ENCOUNTER — Ambulatory Visit: Payer: Medicare Other

## 2018-04-03 DIAGNOSIS — R471 Dysarthria and anarthria: Secondary | ICD-10-CM

## 2018-04-03 DIAGNOSIS — R41841 Cognitive communication deficit: Secondary | ICD-10-CM

## 2018-04-03 NOTE — Patient Instructions (Signed)
  Please complete the assigned speech therapy homework prior to your next session and return it to the speech therapist at your next visit.  

## 2018-04-03 NOTE — Therapy (Signed)
[facility] [address] [place], [place], [address] Phone: [phone]   Fax:  [phone]  Speech Language Pathology Treatment  Patient Details  Name: Zachary [name] MRN: [id number] Date of Birth: 08-27-1951 Referring Provider: Alonza Bogus, DO   Encounter Date: 04/03/2018  End of Session - 04/03/18 1702    Visit Number  12    Number of Visits  17    Date for SLP Re-Evaluation  04/11/18    SLP Start Time  1532    SLP Stop Time   9678    SLP Time Calculation (min)  32 min    Activity Tolerance  Patient tolerated treatment well       Past Medical History:  Diagnosis Date  . Allergy   . Asthma   . Parkinson's disease ([place])    Congenital R fourth nerve palsy    Past Surgical History:  Procedure Laterality Date  . CATARACT EXTRACTION, BILATERAL    . COLONOSCOPY    . SHOULDER SURGERY Left    lymph nodes  . TONSILLECTOMY    . VASECTOMY      There were no vitals filed for this visit.  Subjective Assessment - 04/03/18 1653    Subjective  Arrives today walking quickly into ST room.    Currently in Pain?  No/denies            ADULT SLP TREATMENT - 04/03/18 1654      General Information   Behavior/Cognition  Alert;Cooperative;Pleasant mood      Cognitive-Linquistic Treatment   Treatment focused on  Dysarthria    Skilled Treatment  Pt did not bring 10 every day sentences and/or could not find them. Loud /a/ to recalibrate volume average upper 80s dB. Pt responded with sentences with average of 70dB 18/20, with occasional min A for not talking on residual air and for volume. Comments between stimuli and between all tasks were sub 70dB, but pt made attempt at self correction 25% of the time. Simple covnersation averaged mid 60s dB with mod A for volume and breath support. Higher cognitive load resulted in lower overall speech volume.       Assessment / Recommendations / Plan   Plan  Continue with current  plan of care      Progression Toward Goals   Progression toward goals  Not progressing toward goals (comment) cognition significantly affecting pt's ability to carryover       SLP Education - 04/03/18 1701    Education provided  Yes    Education Details  need to practice consistently, need to bring in 10 everyday sentences     Person(s) Educated  Patient    Methods  Explanation;Handout    Comprehension  Verbalized understanding;Need further instruction       SLP Short Term Goals - 03/27/18 0928      SLP SHORT TERM GOAL #1   Title  Pt will achieve average loud /a/ of average low 90sdB over 3 sessions    Status  Partially Met      SLP SHORT TERM GOAL #2   Title  Pt will use abdominal breathing at rest 75% success over 2 sessions    Status  Partially Met      SLP SHORT TERM GOAL #3   Title  Pt will use speech volume average low 70sdB when responding with sentences 18/20 over two sessions    Status  Partially Met       SLP Long Term  Goals - 04/03/18 1704      SLP LONG TERM GOAL #1   Title  Pt will generate loud /a/ with average of low 90s dB over 6 sessions    Baseline  03-20-18, 03-25-18; 03/27/18    Time  3    Period  Weeks or 17 sessions    Status  On-going      SLP LONG TERM GOAL #2   Title  Pt will use abdominal breathing 70% of the time in 10 minutes simple conversation over three sessions    Period  -- or 17 sessions    Status  Deferred for focus on LOUD speech      SLP LONG TERM GOAL #3   Title  Pt will use speech volume average low 70sdB in 8 minutes of simple conversation with rare min A over 2 sessions    Time  3    Period  Weeks or 17 sessions    Status  On-going      SLP LONG TERM GOAL #4   Title  in therapy tasks, pt will demo compensatory strategies for attention/awareness (asking for assistance, writing down reminders, etc) with occasional min A to use them    Time  3    Period  Weeks    Status  On-going       Plan - 04/03/18 1702    Clinical  Impression Statement  Average volume upon entering ST room - mid 60s dB. After loud /a/ mid 64s was again seen in simple conversation. See "skilled treatment" for details. Continued skilled ST to maximize intelligiblity for improved independence and QOL. It will likely be necessary for wife to attend 1-2 sessions for information/education how to cue pt.    Speech Therapy Frequency  2x / week    Duration  -- 8 weeks/17 sessions    Treatment/Interventions  Compensatory techniques;Functional tasks;SLP instruction and feedback;Environmental controls;Multimodal communcation approach;Internal/external aids;Patient/family education;Cognitive reorganization;Cueing hierarchy    Potential to Achieve Goals  Good    Potential Considerations  Ability to learn/carryover information       Patient will benefit from skilled therapeutic intervention in order to improve the following deficits and impairments:   Dysarthria and anarthria  Cognitive communication deficit    Problem List Patient Active Problem List   Diagnosis Date Noted  . Parkinson's disease ([place]) 05/04/2013    [facility] ,[place], [place]  04/03/2018, 5:06 PM  [facility] [address] [place], [place], [address] Phone: [phone]   Fax:  [phone]   Name: Zachary [name] MRN: [id number] Date of Birth: Feb 16, 1951

## 2018-04-08 ENCOUNTER — Ambulatory Visit: Payer: Medicare Other

## 2018-04-08 DIAGNOSIS — R471 Dysarthria and anarthria: Secondary | ICD-10-CM | POA: Diagnosis not present

## 2018-04-08 DIAGNOSIS — R41841 Cognitive communication deficit: Secondary | ICD-10-CM

## 2018-04-08 NOTE — Therapy (Signed)
[facility] [address] [place], [place], [address] Phone: [phone]   Fax:  [phone]  Speech Language Pathology Treatment  Patient Details  Name: Zachary [name] MRN: [id number] Date of Birth: [birth year] Referring Provider: Alonza Bogus, DO   Encounter Date: 04/08/2018  End of Session - 04/08/18 1651    Visit Number  13    Number of Visits  17    Date for SLP Re-Evaluation  04/11/18    SLP Start Time  1535    SLP Stop Time   1622    SLP Time Calculation (min)  47 min    Activity Tolerance  Patient tolerated treatment well       Past Medical History:  Diagnosis Date  . Allergy   . Asthma   . Parkinson's disease ([place])    Congenital R fourth nerve palsy    Past Surgical History:  Procedure Laterality Date  . CATARACT EXTRACTION, BILATERAL    . COLONOSCOPY    . SHOULDER SURGERY Left    lymph nodes  . TONSILLECTOMY    . VASECTOMY      There were no vitals filed for this visit.  Subjective Assessment - 04/08/18 1541    Subjective  Arrives today walking quickly into ST room. Pt's "everyday  sentences" are about the World Cup and [place].    Currently in Pain?  No/denies            ADULT SLP TREATMENT - 04/08/18 1544      General Information   Behavior/Cognition  Alert;Cooperative;Pleasant mood      Treatment Provided   Treatment provided  Cognitive-Linquistic      Cognitive-Linquistic Treatment   Treatment focused on  Dysarthria    Skilled Treatment  Pt homework is approx 30% done. Pt brought in two attempts at his 10 every day sentences; first set that pt handed SLP were revolved around sporting events. When SLP cued pt that these were supposed to be applicable for EVERY DAY pt retrieved another set that was more appropriate. Pt read these sentences with average upper 70s dB with occasional cues for loudness and frequent repeats due to not reading the sentence correctly (impulsivity). Loud  /a/ was completed with average upper 80s dB with consistent cues for full breath and usual cues for loudness. Sentences were read with average upper 60s - low 70s dBs.  with occasional min A for not talking on residual air and for volume. Comments between stimuli and between all tasks remained sub-70dB. Covnersation awith pt wife, pt and SLP re: pt's cognitive deficits averaged mid 60s dB with mod A for volume and breath support. SLP educated pt's wife re: nonverbal cues for loudness and a simple verbal cue to have pt come to the room she is in to communicate.       Assessment / Recommendations / Plan   Plan  Continue with current plan of care      Progression Toward Goals   Progression toward goals  Not progressing toward goals (comment) due to pt cognitive status; wife needed for carryover       SLP Education - 04/08/18 1650    Education provided  Yes    Education Details  need to have some reminders about doing/sequencing steps; need cues for louder speech and to come to the same room to communicate; nonverbal cues for loudness     Person(s) Educated  Patient;Spouse    Methods  Explanation;Demonstration  Comprehension  Verbalized understanding;Need further instruction       SLP Short Term Goals - 03/27/18 7371      SLP SHORT TERM GOAL #1   Title  Pt will achieve average loud /a/ of average low 90sdB over 3 sessions    Status  Partially Met      SLP SHORT TERM GOAL #2   Title  Pt will use abdominal breathing at rest 75% success over 2 sessions    Status  Partially Met      SLP SHORT TERM GOAL #3   Title  Pt will use speech volume average low 70sdB when responding with sentences 18/20 over two sessions    Status  Partially Met       SLP Long Term Goals - 04/08/18 1653      SLP LONG TERM GOAL #1   Title  Pt will generate loud /a/ with average of low 90s dB over 6 sessions    Baseline  03-20-18, 03-25-18; 03/27/18    Time  2    Period  Weeks or 17 sessions    Status  On-going       SLP LONG TERM GOAL #2   Title  Pt will use abdominal breathing 70% of the time in 10 minutes simple conversation over three sessions    Period  -- or 17 sessions    Status  Deferred for focus on LOUD speech      SLP LONG TERM GOAL #3   Title  Pt will use speech volume average low 70sdB in 8 minutes of simple conversation with rare min A over 2 sessions    Time  2    Period  Weeks or 17 sessions    Status  On-going      SLP LONG TERM GOAL #4   Title  in therapy tasks, pt will demo compensatory strategies for attention/awareness (asking for assistance, writing down reminders, etc) with occasional min A to use them    Time  2    Period  Weeks    Status  On-going       Plan - 04/08/18 1652    Clinical Impression Statement  Pt's volume entering ST room was sub-70dB. Pt's wife attended last 20 minutes of session today. See "skilled treatment" for details. Continued skilled ST to maximize intelligiblity for improved independence and QOL, as well as educate pt/wife on details necessary to have pt communicate as easily as possible. It will likely be necessary for wife to attend 1-2 sessions for information/education how to cue pt. Pt/wife agreeable to canceling last scheduled session -with a new SLP, due to this SLP vacation.   Speech Therapy Frequency  2x / week    Duration  -- 8 weeks/17 sessions    Treatment/Interventions  Compensatory techniques;Functional tasks;SLP instruction and feedback;Environmental controls;Multimodal communcation approach;Internal/external aids;Patient/family education;Cognitive reorganization;Cueing hierarchy    Potential to Achieve Goals  Good    Potential Considerations  Ability to learn/carryover information       Patient will benefit from skilled therapeutic intervention in order to improve the following deficits and impairments:   Dysarthria and anarthria  Cognitive communication deficit    Problem List Patient Active Problem List   Diagnosis Date  Noted  . Parkinson's disease ([place]) 05/04/2013    [facility] ,[place], [place]  04/08/2018, 4:54 PM  [facility] [address] [place], [place], [address] Phone: [phone]   Fax:  [phone]   Name: Zachary [name]  MRN: [id number] Date of Birth: 22-May-1951

## 2018-04-10 ENCOUNTER — Ambulatory Visit: Payer: Medicare Other

## 2018-04-10 DIAGNOSIS — R471 Dysarthria and anarthria: Secondary | ICD-10-CM

## 2018-04-10 DIAGNOSIS — R41841 Cognitive communication deficit: Secondary | ICD-10-CM

## 2018-04-10 NOTE — Therapy (Signed)
[facility] [address] [place], [place], [address] Phone: [phone]   Fax:  [phone]  Speech Language Pathology Treatment  Patient Details  Name: Zachary [name] MRN: [id number] Date of Birth: 09/07/1951 Referring Provider: Alonza Bogus, DO   Encounter Date: 04/10/2018  End of Session - 04/10/18 1713    Visit Number  14    Number of Visits  17    Date for SLP Re-Evaluation  04/11/18    SLP Start Time  1533    SLP Stop Time   1615    SLP Time Calculation (min)  42 min    Activity Tolerance  Patient tolerated treatment well       Past Medical History:  Diagnosis Date  . Allergy   . Asthma   . Parkinson's disease ([place])    Congenital R fourth nerve palsy    Past Surgical History:  Procedure Laterality Date  . CATARACT EXTRACTION, BILATERAL    . COLONOSCOPY    . SHOULDER SURGERY Left    lymph nodes  . TONSILLECTOMY    . VASECTOMY      There were no vitals filed for this visit.  Subjective Assessment - 04/10/18 1540    Subjective  Wife arrives with pt today.     Currently in Pain?  No/denies            ADULT SLP TREATMENT - 04/10/18 1541      General Information   Behavior/Cognition  Alert;Cooperative;Pleasant mood      Treatment Provided   Treatment provided  Cognitive-Linquistic      Cognitive-Linquistic Treatment   Treatment focused on  Dysarthria    Skilled Treatment  Pt wife accompanied pt today. SLP asked for pt 10 everyday sentences and pt learched through his 3 ring and could not locate. SLP, pt, and wife (indirectly) worked with pt on organizing his 3-ring. SLP took this oppotunity to educate pt/wife on decr'd cognitive status with PD, pt may require cues, SLP told pt that he may need cues from his wife. SLP reiterated need to practice exercises every day and wrote reminder for him to put on front of his binder. Wife to A pt with dividers when they return home. SLP assisted pt and  wife generating 8 everyday sentences - pt was wrote down a sentence his wife would say in response to one of his everyday sentences - SLP had to cue pt x2-3 during this process that these were sentences HE would say. Pt conversation throughout this time was measured at average mid 60s dB. Loud /a/ performed and pt had low 90s dB average. He read everyday sentences with average low 80s dB with mod-max cues for loudness.Stressed to pt/wife importance of loud /a/ and everyday sentences BID. Today, pt wrote notes about pointers SLP provided. SLP encouraged putting them in ST section of 3-ring.      Assessment / Recommendations / Plan   Plan  Continue with current plan of care      Progression Toward Goals   Progression toward goals  Not progressing toward goals (comment) d/c next session       SLP Education - 04/10/18 1709    Education provided  Yes    Education Details  Continue loud /a/ and add everyday sentences BID, pt will need assistance, wife may assist pt,     Person(s) Educated  Patient;Spouse    Methods  Explanation;Demonstration    Comprehension  Verbalized understanding;Need further instruction  SLP Short Term Goals - 03/27/18 8341      SLP SHORT TERM GOAL #1   Title  Pt will achieve average loud /a/ of average low 90sdB over 3 sessions    Status  Partially Met      SLP SHORT TERM GOAL #2   Title  Pt will use abdominal breathing at rest 75% success over 2 sessions    Status  Partially Met      SLP SHORT TERM GOAL #3   Title  Pt will use speech volume average low 70sdB when responding with sentences 18/20 over two sessions    Status  Partially Met       SLP Long Term Goals - 04/10/18 1716      SLP LONG TERM GOAL #1   Title  Pt will generate loud /a/ with average of low 90s dB over 6 sessions    Baseline  03-20-18, 03-25-18; 03/27/18, 04-08-18    Time  2    Period  Weeks or 17 sessions    Status  On-going      SLP LONG TERM GOAL #2   Title  Pt will use abdominal  breathing 70% of the time in 10 minutes simple conversation over three sessions    Period  -- or 17 sessions    Status  Deferred for focus on LOUD speech      SLP LONG TERM GOAL #3   Title  Pt will use speech volume average low 70sdB in 8 minutes of simple conversation with rare min A over 2 sessions    Time  2    Period  Weeks or 17 sessions    Status  On-going      SLP LONG TERM GOAL #4   Title  in therapy tasks, pt will demo compensatory strategies for attention/awareness (asking for assistance, writing down reminders, etc) with occasional min A to use them    Status  Achieved       Plan - 04/10/18 1713    Clinical Impression Statement  Pt's volume entering ST room was mid 60s dB average. See "skilled treatment" for details of today's session. Continued skilled ST one more session to reinforce need for consistency for maintenance tasks (loud /a/, everyday sentences, PT/OT exercises) for improved QOL.    Speech Therapy Frequency  2x / week    Duration  -- 8 weeks/17 sessions    Treatment/Interventions  Compensatory techniques;Functional tasks;SLP instruction and feedback;Environmental controls;Multimodal communcation approach;Internal/external aids;Patient/family education;Cognitive reorganization;Cueing hierarchy    Potential to Achieve Goals  Good    Potential Considerations  Ability to learn/carryover information       Patient will benefit from skilled therapeutic intervention in order to improve the following deficits and impairments:   Dysarthria and anarthria  Cognitive communication deficit    Problem List Patient Active Problem List   Diagnosis Date Noted  . Parkinson's disease ([place]) 05/04/2013    [facility] ,[place], [place]  04/10/2018, 5:18 PM  [facility] [address] [place], [place], [address] Phone: [phone]   Fax:  [phone]   Name: SHAHZAD THOMANN MRN: [id number] Date of Birth:  01-Sep-1951

## 2018-04-15 ENCOUNTER — Ambulatory Visit: Payer: Medicare Other

## 2018-04-15 DIAGNOSIS — R41841 Cognitive communication deficit: Secondary | ICD-10-CM

## 2018-04-15 DIAGNOSIS — R471 Dysarthria and anarthria: Secondary | ICD-10-CM

## 2018-04-15 DIAGNOSIS — R351 Nocturia: Secondary | ICD-10-CM

## 2018-04-15 HISTORY — DX: Nocturia: R35.1

## 2018-04-15 NOTE — Therapy (Signed)
[facility] [address] [place], [place], [address] Phone: [phone]   Fax:  [phone]  Speech Language Pathology Treatment- renewal/discharge  Patient Details  Name: Zachary [name] MRN: [id number] Date of Birth: 05-06-1951 Referring Provider: Alonza Bogus, DO   Encounter Date: 04/15/2018  End of Session - 04/15/18 1807    Visit Number  15    Number of Visits  17    Date for SLP Re-Evaluation  04/11/18    SLP Start Time  69    SLP Stop Time   1615    SLP Time Calculation (min)  41 min    Activity Tolerance  Patient tolerated treatment well       Past Medical History:  Diagnosis Date  . Allergy   . Asthma   . Parkinson's disease ([place])    Congenital R fourth nerve palsy    Past Surgical History:  Procedure Laterality Date  . CATARACT EXTRACTION, BILATERAL    . COLONOSCOPY    . SHOULDER SURGERY Left    lymph nodes  . TONSILLECTOMY    . VASECTOMY      There were no vitals filed for this visit.  Subjective Assessment - 04/15/18 1541    Subjective  Wife arrives with pt today.     Currently in Pain?  No/denies            ADULT SLP TREATMENT - 04/15/18 1542      General Information   Behavior/Cognition  Alert;Cooperative;Pleasant mood;Impulsive      Treatment Provided   Treatment provided  Cognitive-Linquistic      Cognitive-Linquistic Treatment   Treatment focused on  Dysarthria    Skilled Treatment  Pt lost his notebook with therapy papers and 10 sentences in it. Wife showed SLP the video of pt and grandkids doing loud /a/. Average loud /a/ was low to mid 90s dB with initial cues for full breath x2 reps. Sentence responses provided to SLP with average upper 60s to lower 70s dB. Off topic/handed remarks and utterances were measured in mid to upper 60s. SLP problem solved with pt and wife regarding communication breakdowns. Pt began remarking about content issues that impede communication  (someone getting offended). SLP reiterated the question and wife stated the pt/wife resolve that they cannot hear each other sometimes and are fine with that. SLP suggested pt's wife try a direct cue for pt "Come to the balcony and shout" and encouraged her to use direct requests to foster pt understanding and decr processing time. Wife nor pt with any other pressing questions or concerns. SLP encouraged pt and wife to have pt cont with loud /a/ and his 10 everyday sentences (once found). They expressed knowledge that both were important.       Assessment / Recommendations / Plan   Plan  Continue with current plan of care;Discharge SLP treatment due to (comment) max potential reached      Progression Toward Goals   Progression toward goals  -- see goal update       SLP Education - 04/15/18 1806    Education provided  Yes    Education Details  compensations for softer speech, suggestions on cues (direct, short and to the point) due to pt incr'd processing time    Person(s) Educated  Patient;Spouse    Methods  Explanation;Demonstration    Comprehension  Verbalized understanding       SLP Short Term Goals - 03/27/18 5852      SLP  SHORT TERM GOAL #1   Title  Pt will achieve average loud /a/ of average low 90sdB over 3 sessions    Status  Partially Met      SLP SHORT TERM GOAL #2   Title  Pt will use abdominal breathing at rest 75% success over 2 sessions    Status  Partially Met      SLP SHORT TERM GOAL #3   Title  Pt will use speech volume average low 70sdB when responding with sentences 18/20 over two sessions    Status  Partially Met       SLP Long Term Goals - 04/15/18 1808      SLP LONG TERM GOAL #1   Title  Pt will generate loud /a/ with average of low 90s dB over 6 sessions    Period  -- or 17 sessions    Status  Partially Met      SLP LONG TERM GOAL #2   Title  Pt will use abdominal breathing 70% of the time in 10 minutes simple conversation over three sessions     Period  -- or 17 sessions    Status  Deferred for focus on LOUD speech      SLP LONG TERM GOAL #3   Title  Pt will use speech volume average low 70sdB in 8 minutes of simple conversation with rare min A over 2 sessions    Period  -- or 17 sessions    Status  Not Met      SLP LONG TERM GOAL #4   Title  in therapy tasks, pt will demo compensatory strategies for attention/awareness (asking for assistance, writing down reminders, etc) with occasional min A to use them    Status  Achieved       Plan - 04/15/18 1807    Clinical Impression Statement  Pt's volume entering ST room was mid 60s dB average, and this volume was maintained between strucutred speech tasks. See "skilled treatment" for details of today's session. Pt will be d/c'd at this time for meeting his max rehab potential    Treatment/Interventions  Compensatory techniques;Functional tasks;SLP instruction and feedback;Environmental controls;Multimodal communcation approach;Internal/external aids;Patient/family education;Cognitive reorganization;Cueing hierarchy    Potential to Achieve Goals  Good    Potential Considerations  Ability to learn/carryover information       Patient will benefit from skilled therapeutic intervention in order to improve the following deficits and impairments:   Dysarthria and anarthria  Cognitive communication deficit   SPEECH THERAPY RENEWAL/DISCHARGE SUMMARY  Visits from Start of Care: 15  Current functional level related to goals / functional outcomes: Pt partially met his LTGs; decr'd cognitive linguistics and amount of pt impulsivity and pt-demonstrated bradyphrenia negatively impacted his ability for long term change in his speech loudness. Pt's loudness in structured therapy tasks was louder than in simple conversation between tasks, but his average loudness during those tasks did not regularly fall into the normal range and was widely variable. Goals presently enforced will be done so through  today at which time pt will be d/c'd.   Remaining deficits: Mod dysarthria, severe cognitive linguistic deficits.   Education / Equipment: Compensating for softer speech, loud /a/ and everyday sentences are important to continue.  Plan: Patient agrees to discharge.  Patient goals were partially met. Patient is being discharged due to                                                     ?????  max rehab potential reached.       Problem List Patient Active Problem List   Diagnosis Date Noted  . Parkinson's disease ([place]) 05/04/2013    [facility] ,[place], [place]  04/15/2018, 6:09 PM  [facility] [address] [place], [place], [address] Phone: [phone]   Fax:  [phone]   Name: Zachary [name] MRN: [id number] Date of Birth: July 03, 1951

## 2018-04-15 NOTE — Patient Instructions (Signed)
Continue with your loud ah's and your 10 sentences - TWICE A DAY!

## 2018-04-17 ENCOUNTER — Ambulatory Visit: Payer: Medicare Other | Admitting: Neurology

## 2018-04-17 ENCOUNTER — Encounter: Payer: Medicare Other | Admitting: Speech Pathology

## 2018-04-22 ENCOUNTER — Ambulatory Visit: Payer: Medicare Other | Admitting: Neurology

## 2018-04-22 NOTE — Progress Notes (Signed)
Zachary [name] was seen today in the movement disorders clinic for neurologic consultation at the request of Martha Clan, MD.  The patient is accompanied by his wife who supplements the history.  The consultation is for the evaluation of PD.  Patient has seen Dr. Frances Furbish and Dr. Zola Button previously.  I have reviewed an extensive number of records available to me.  Patient has not been seeing Dr. Zola Button very long and just started seeing him in March, 2018.  His last visit was on Feb 26, 2017.  The patient believes that his first symptom was decreased left arm swing in 1998 or 1999.  He did go to urgent care in approximately the year 2000 and they told him that they thought he had Parkinson's disease.  He was formally diagnosed in September, 2000.  He was subsequently followed by Dr. Sandria Manly.  Records indicate that the patient was initially treated with selegiline and pramipexole.  Levodopa was added in 2005.  Azilect in 2008.  At some point in time, he was changed to Stalevo and amantadine was added.  He followed with Dr. Sandria Manly until he retired and then was transferred to Dr. Frances Furbish in 2014.  He transferred to Dr. Zola Button in March, 2018.  DBS has been discussed many times over the years, but the patient has not wanted to proceed.  The patient developed motor freezing in approximately 2016.  He is currently on Stalevo 100, 1 tablet at 6:30 AM/9 AM/11:30am/2 PM/4:30/7 PM. He takes carbidopa/levodopa 50/200 CR at bedtime (9:30 PM).  He is on pramipexole 0.5 mg tid.  Dr. Zola Button stopped amantadine last visit because of cognitive changes.  Pt didn't do that and is on 100 mg q day.  Instead he started taking hemp oil and he didn't want to change 2 things at once.   12/13/17 update: The patient is seen today in follow-up, accompanied by his wife who supplements the history.  The patients Stalevo was reworked last visit so that he is now on Stalevo 100, 2 tablets at 6:30 am, 1 tablet at 9:00 am, 2 tablets at 11:30 am, 1 tablet at 2 pm,  1 tablet at 4:30 pm, 1 tablet at 7:00 pm.  He is not following this and is taking stalevo 100, 2 tablets at 6:30 am, 1 tablet at 9:00 am, 2 tablets at 11:30 am, 1 tablet at 2 pm, 1 tablet at 4:30 pm, 1 tablet at 7:00 pm (this was the previous dosing schedule).  pts wife states that he felt that he didn't need it but now thinks that he does.  They are wanting to increase that now.   He remains on carbidopa/levodopa 50/200 at bedtime and pramipexole 0.5 mg 3 times per day. He is having some unpredictable off and takes some time to get back "on" once takes medication. Still on amantadine 100 mg daily.  He denies compulsive behaviors.  Denies sleep attacks.  He did have neurocognitive testing completed on October 31, 2017.  He has already followed up with Dr. Alinda Dooms.  No dementia was evident.  This demonstrated mild cognitive impairment.  Also, it was recommended that the patient consider PT/OT given trouble with managing dressing and trouble getting in and out of bed.  Still at [facility] 3-5 days per week but didn't add CV activity.  He is in the drumming program.  Few falls since last visit and he went through a dark/narrow area in the house (didn't turn on light) and fell.  He had  a fall at funeral.  He was standing and fell but people caught him.  He was seeing a few hallucinations but gone over the last few weeks.  "I will occasionally start a conversation with a grandchild that isn't there."  04/24/18 update:  Pt seen in f/u.  This patient is accompanied in the office by his spouse who supplements the history.  He is on 2 tablets at 6:30 am, 1 tablet at 9:00 am, 1 tablets at 11:30 am, 2 tablet at 2 pm, 1 tablet at 4:30 pm, 1 tablet at 7:00 pm.  He still takes carbidopa/levodopa 50/200 at bedtime.  He actually decreased his pramipexole to 0.25 mg tid and feels better cognitively.  He is still on amantadine 100 mg once per day.  He fell one time at the house and one [place], where someone caught him.     He was signed up for inbrija but didn't get it.  They are trying hemp oil and think that it helps with freezing.    records have been reviewed since our last visit.  He has attended [facility].  "I did better than I expected."  He states hallucinations are markedly better.  PREVIOUS MEDICATIONS: selegiline, rasagiline, pramipexole, carbidopa/levodopa, Stalevo, pramipexole, amantadine   ALLERGIES:   Allergies  Allergen Reactions  . Tree Extract     CURRENT MEDICATIONS:  Outpatient Encounter Medications as of 04/24/2018  Medication Sig  . amantadine (SYMMETREL) 100 MG capsule TAKE 1 CAPSULE DAILY.  . carbidopa-levodopa (SINEMET CR) 50-200 MG tablet TAKE ONE TABLET AT BEDTIME.  . carbidopa-levodopa-entacapone (STALEVO) 25-100-200 MG tablet 2 tablets at 6:30 am, 1 tablet at 9:00 am, 1 tablets at 11:30 am, 2 tablet at 2 pm, 1 tablet at 4:30 pm, 1 tablet at 7:00 pm  . pramipexole (MIRAPEX) 0.5 MG tablet TAKE 1 TABLET 3 TIMES DAILY AT 9AM, 12NOON, AND 6PM. (Patient taking differently: TAKE half a TABLET 3 TIMES DAILY AT 9AM, 12NOON, AND 4PM.)  . rasagiline (AZILECT) 1 MG TABS tablet TAKE 1 TABLET BY MOUTH DAILY.  Marland Kitchen. UNABLE TO FIND Hemp 35 mg in the morning, 15 mg in the afternoon   No facility-administered encounter medications on file as of 04/24/2018.     PAST MEDICAL HISTORY:   Past Medical History:  Diagnosis Date  . Allergy   . Asthma   . Parkinson's disease (HCC)    Congenital R fourth nerve palsy    PAST SURGICAL HISTORY:   Past Surgical History:  Procedure Laterality Date  . CATARACT EXTRACTION, BILATERAL    . COLONOSCOPY    . SHOULDER SURGERY Left    lymph nodes  . TONSILLECTOMY    . VASECTOMY      SOCIAL HISTORY:   Social History   Socioeconomic History  . Marital status: Married    Spouse name: Rosey Batheresa  . Number of children: 3  . Years of education: Ph.D  . Highest education level: Not on file  Occupational History  . Occupation: retired      Associate Professormployer: [employer]    Comment: professor - [employer]  Social Needs  . Financial resource strain: Not on file  . Food insecurity:    Worry: Not on file    Inability: Not on file  . Transportation needs:    Medical: Not on file    Non-medical: Not on file  Tobacco Use  . Smoking status: Never Smoker  . Smokeless tobacco: Never Used  Substance and  Sexual Activity  . Alcohol use: Yes    Alcohol/week: 0.0 oz    Comment: two drinks a week  . Drug use: No    Comment: hemp oil  . Sexual activity: Not on file  Lifestyle  . Physical activity:    Days per week: Not on file    Minutes per session: Not on file  . Stress: Not on file  Relationships  . Social connections:    Talks on phone: Not on file    Gets together: Not on file    Attends religious service: Not on file    Active member of club or organization: Not on file    Attends meetings of clubs or organizations: Not on file    Relationship status: Not on file  . Intimate partner violence:    Fear of current or ex partner: Not on file    Emotionally abused: Not on file    Physically abused: Not on file    Forced sexual activity: Not on file  Other Topics Concern  . Not on file  Social History Narrative   Pt lives at home with family.   Caffeine Use: 1-2 cups daily.    FAMILY HISTORY:   Family Status  Relation Name Status  . Mother  Deceased  . Father  Deceased at age 28       lungs, kidney  . Brother  Alive  . MGM  (Not Specified)  . Child x3 Alive    ROS:  Review of Systems  Constitutional: Negative.   HENT: Negative.   Eyes: Negative.   Respiratory: Negative.   Cardiovascular: Negative.   Gastrointestinal: Negative.   Genitourinary: Negative.   Musculoskeletal: Negative.   Skin: Negative.   Endo/Heme/Allergies: Negative.   Psychiatric/Behavioral: Negative.     PHYSICAL EXAMINATION:    VITALS:   Vitals:   04/24/18 0808  BP: 90/60  Pulse: 80  SpO2: 96%  Weight: 172 lb 6 oz (78.2 kg)  Height: 5'  9.5" (1.765 m)   GEN:  The patient appears stated age and is in NAD. HEENT:  Normocephalic, atraumatic.  The mucous membranes are moist. The superficial temporal arteries are without ropiness or tenderness. CV:  RRR Lungs:  CTAB Neck/HEME:  There are no carotid bruits bilaterally.  Neurological examination:  Orientation: The patient is alert and oriented x3. Cranial nerves: There is good facial symmetry. The speech is fluent and clear. Soft palate rises symmetrically and there is no tongue deviation. Hearing is intact to conversational tone. Sensation: Sensation is intact to light touch throughout Motor: Strength is 5/5 in the bilateral upper and lower extremities.   Shoulder shrug is equal and symmetric.  There is no pronator drift.  Movement examination: Tone: There is mild to mod increased tone in the bilateral UE (almost time for medication) Abnormal movements: no tremor today Coordination:  There is mild trouble with hand opening and closing.  Otherwise good RAMs Gait and Station: The patient gets OOC on first attempt without hands.  He has mild start hesitation.  Walks well in hall today with good arm swing.    ASSESSMENT/PLAN:  1.  PD, dx 06/1999  -He will continue Stalevo 2 tablets at 6:30 am, 1 tablet at 9:00 am, 1 tablets at 11:30 am, 2 tablet at 2 pm, 1 tablet at 4:30 pm, 1 tablet at 7:00 pm.   -He will continue carbidopa/levodopa 50/200 CR at bedtime (930 PM).    -He will decrease pramipexole, 0.25 mg from tid  to bid for a week then q day for a week and then d/c it.  Hallucinations are improving.  -discussed inbrija.  He is having unpredictable off.  Discussed issue with him having asthma but he reports on no medication and hasn't had asthma flare up since 2000 when he was cutting down bradford pears.  I gave him inbrija to try.  Risks, benefits, side effects and alternative therapies were discussed.  The opportunity to ask questions was given and they were answered to the best  of my ability.  The patient expressed understanding and willingness to follow the outlined treatment protocols.  -for now, he will continue azilect but likely will d/c soon.  Is very expensive for him and likely cost not worth the limited benefit  -The patient is currently using CBD oil.   Discussed again recommendation of AAN.  Talked about smaller trials in which marijuana helped tremor.  However, there is some data that suggests that it worsens cognition and falls.  The data also suggests that CBD oil is less effective than marijuana.  However, there have been concerns given the fact that CBD oil is unregulated and each manufacturer has different amounts of ingredient and the purity of each manufacturers ingredient has been called into question.  At this time, it is not recommended for the treatment of Parkinson's disease.  Further studies do need to be completed.    -Patient is currently going to the [facility] gym and we talked about the fact that I was really proud of him for doing so much activity, but I also want to see him add in cardiovascular activity.  He reports that he is going to investigate RSB again  2.  REM behavior disorder  -This is commonly associated with PD and the patient is experiencing this.  We discussed that this can be very serious and even harmful.  We talked about medications as well as physical barriers to put in the bed (particularly soft bed rails, pillow barriers).  We talked about moving the night stand so that it is not so close to the side of the bed.  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  3.  Mild cognitive impairment  -Patient had neurocognitive testing on October 31, 2017.  There was only evidence of mild cognitive impairment, and no dementia.  4.  Follow up is anticipated in the next few months, sooner should new neurologic issues arise.  Much  greater than 50% of this visit was spent in counseling and coordinating care.  Total face to face time:  25 min   Cc:  Martha Clan, MD

## 2018-04-24 ENCOUNTER — Ambulatory Visit: Payer: Medicare Other | Admitting: Neurology

## 2018-04-24 ENCOUNTER — Encounter: Payer: Self-pay | Admitting: Neurology

## 2018-04-24 VITALS — BP 90/60 | HR 80 | Ht 69.5 in | Wt 172.4 lb

## 2018-04-24 DIAGNOSIS — G4752 REM sleep behavior disorder: Secondary | ICD-10-CM

## 2018-04-24 DIAGNOSIS — G20 Parkinson's disease: Secondary | ICD-10-CM | POA: Diagnosis not present

## 2018-04-24 DIAGNOSIS — R441 Visual hallucinations: Secondary | ICD-10-CM | POA: Diagnosis not present

## 2018-04-24 MED ORDER — LEVODOPA 42 MG IN CAPS: 42.0000 mg | ORAL_CAPSULE | Freq: Every day | RESPIRATORY_TRACT | 0 refills | Status: DC

## 2018-04-24 NOTE — Patient Instructions (Signed)
Decrease pramipexole to 0.5 mg - 1/2 tablet twice a day for a week, then 1/2 tablet once a day for a week and then stop the medication.  Let me know if you have any problems with it.    Let me know how you do with inbrija  See you in 4 months!

## 2018-04-28 ENCOUNTER — Telehealth: Payer: Self-pay | Admitting: Neurology

## 2018-04-28 NOTE — Telephone Encounter (Signed)
-----   Message from Octaviano Battyebecca S [name], DO sent at 04/24/2018  8:18 AM EDT ----- Can you figure out why he hasn't gotten inbrija yet?

## 2018-04-28 NOTE — Telephone Encounter (Signed)
Spoke with patient's wife and made aware they have sent to [facility]. Ph. [phone]. They probably have reached out but they didn't answer due to it being a "1-800" number. I gave them the number to contact the pharmacy about getting the medication shipped to them. They will call back with any questions.

## 2018-06-03 ENCOUNTER — Other Ambulatory Visit: Payer: Self-pay | Admitting: Neurology

## 2018-06-03 DIAGNOSIS — G20 Parkinson's disease: Secondary | ICD-10-CM

## 2018-07-04 ENCOUNTER — Other Ambulatory Visit: Payer: Self-pay | Admitting: Neurology

## 2018-07-04 DIAGNOSIS — G20 Parkinson's disease: Secondary | ICD-10-CM

## 2018-08-12 ENCOUNTER — Other Ambulatory Visit: Payer: Self-pay | Admitting: Neurology

## 2018-08-12 DIAGNOSIS — G20 Parkinson's disease: Secondary | ICD-10-CM

## 2018-08-28 ENCOUNTER — Other Ambulatory Visit: Payer: Self-pay | Admitting: Neurology

## 2018-08-28 DIAGNOSIS — G20 Parkinson's disease: Secondary | ICD-10-CM

## 2018-08-28 DIAGNOSIS — R413 Other amnesia: Secondary | ICD-10-CM

## 2018-09-03 NOTE — Progress Notes (Signed)
Zachary [name] was seen today in the movement disorders clinic for neurologic consultation at the request of Martha Clan, MD.  The patient is accompanied by his wife who supplements the history.  The consultation is for the evaluation of PD.  Patient has seen Dr. Frances Furbish and Dr. Zola Button previously.  I have reviewed an extensive number of records available to me.  Patient has not been seeing Dr. Zola Button very long and just started seeing him in March, 2018.  His last visit was on Feb 26, 2017.  The patient believes that his first symptom was decreased left arm swing in 1998 or 1999.  He did go to urgent care in approximately the year 2000 and they told him that they thought he had Parkinson's disease.  He was formally diagnosed in September, 2000.  He was subsequently followed by Dr. Sandria Manly.  Records indicate that the patient was initially treated with selegiline and pramipexole.  Levodopa was added in 2005.  Azilect in 2008.  At some point in time, he was changed to Stalevo and amantadine was added.  He followed with Dr. Sandria Manly until he retired and then was transferred to Dr. Frances Furbish in 2014.  He transferred to Dr. Zola Button in March, 2018.  DBS has been discussed many times over the years, but the patient has not wanted to proceed.  The patient developed motor freezing in approximately 2016.  He is currently on Stalevo 100, 1 tablet at 6:30 AM/9 AM/11:30am/2 PM/4:30/7 PM. He takes carbidopa/levodopa 50/200 CR at bedtime (9:30 PM).  He is on pramipexole 0.5 mg tid.  Dr. Zola Button stopped amantadine last visit because of cognitive changes.  Pt didn't do that and is on 100 mg q day.  Instead he started taking hemp oil and he didn't want to change 2 things at once.   12/13/17 update: The patient is seen today in follow-up, accompanied by his wife who supplements the history.  The patients Stalevo was reworked last visit so that he is now on Stalevo 100, 2 tablets at 6:30 am, 1 tablet at 9:00 am, 2 tablets at 11:30 am, 1 tablet at 2 pm,  1 tablet at 4:30 pm, 1 tablet at 7:00 pm.  He is not following this and is taking stalevo 100, 2 tablets at 6:30 am, 1 tablet at 9:00 am, 2 tablets at 11:30 am, 1 tablet at 2 pm, 1 tablet at 4:30 pm, 1 tablet at 7:00 pm (this was the previous dosing schedule).  pts wife states that he felt that he didn't need it but now thinks that he does.  They are wanting to increase that now.   He remains on carbidopa/levodopa 50/200 at bedtime and pramipexole 0.5 mg 3 times per day. He is having some unpredictable off and takes some time to get back "on" once takes medication. Still on amantadine 100 mg daily.  He denies compulsive behaviors.  Denies sleep attacks.  He did have neurocognitive testing completed on October 31, 2017.  He has already followed up with Dr. Alinda Dooms.  No dementia was evident.  This demonstrated mild cognitive impairment.  Also, it was recommended that the patient consider PT/OT given trouble with managing dressing and trouble getting in and out of bed.  Still at [facility] 3-5 days per week but didn't add CV activity.  He is in the drumming program.  Few falls since last visit and he went through a dark/narrow area in the house (didn't turn on light) and fell.  He had  a fall at funeral.  He was standing and fell but people caught him.  He was seeing a few hallucinations but gone over the last few weeks.  "I will occasionally start a conversation with a grandchild that isn't there."  04/24/18 update:  Pt seen in f/u.  This patient is accompanied in the office by his spouse who supplements the history.  He is on 2 tablets at 6:30 am, 1 tablet at 9:00 am, 1 tablets at 11:30 am, 2 tablet at 2 pm, 1 tablet at 4:30 pm, 1 tablet at 7:00 pm.  He still takes carbidopa/levodopa 50/200 at bedtime.  He actually decreased his pramipexole to 0.25 mg tid and feels better cognitively.  He is still on amantadine 100 mg once per day.  He fell one time at the house and one [place] fall [place], where someone caught him.     He was signed up for inbrija but didn't get it.  They are trying hemp oil and think that it helps with freezing.    records have been reviewed since our last visit.  He has attended therapy [facility].  "I did better than I expected."  He states hallucinations are markedly better.  09/05/18 update: Patient is seen today in follow-up for Parkinson's disease, accompanied by his wife who supplements the history.  Patient is on Stalevo, 2 tablets at 6:30 AM/1 tablet at 9 AM/1 tablet at 11:30 AM/2 tablets at 2 PM/1 tablet at 4:30 PM/1 tablet at 7 PM.  He is also on carbidopa/levodopa 50/200 at bedtime.  I stopped his pramipexole last visit but they report he did not change that and is still on that at 0.5 mg, 1/2 tablet tid.   Still on Azilect.  Still on amantadine, 100 mg once per day.  Admits that he has been keeping pills regimented for a few weeks.   Has occasional hallucinations.  Larey Seat one time on an inclined parking lot and skinned his knee.    No lightheadedness or near syncope.  In regards to Inbrija, the patient states that he tried it one time and "I wasn't impressed."  He didn't like the mechanism of putting it together and it caused a cough.  He is using hemp and wife thinks that it works.  Is doing [employer] class and RSB class.  He is going to [facility] days per [facility]  PREVIOUS MEDICATIONS: selegiline, rasagiline, pramipexole, carbidopa/levodopa, Stalevo, pramipexole, amantadine   ALLERGIES:   Allergies  Allergen Reactions  . Tree Extract     CURRENT MEDICATIONS:  Outpatient Encounter Medications as of 09/05/2018  Medication Sig  . amantadine (SYMMETREL) 100 MG capsule TAKE 1 CAPSULE DAILY.  . carbidopa-levodopa (SINEMET CR) 50-200 MG tablet TAKE ONE TABLET AT BEDTIME.  . carbidopa-levodopa-entacapone (STALEVO) 25-100-200 MG tablet TAKE 2 TABLETS AT 6:30AM, 1 TAB AT 9AM, 1 TAB AT 11:30AM, 2 AT 2PM, 1TAB AT 4:30PM, AND 7PM.  . pramipexole (MIRAPEX) 0.5 MG tablet TAKE 1 TABLET 3  TIMES DAILY AT 9AM, 12NOON, AND 6PM. (Patient taking differently: TAKE half a TABLET 3 TIMES DAILY AT 9AM, 12NOON, AND 4PM.)  . rasagiline (AZILECT) 1 MG TABS tablet TAKE 1 TABLET BY MOUTH DAILY.  Marland Kitchen UNABLE TO FIND Hemp 35 mg in the morning, 15 mg in the afternoon  . [DISCONTINUED] Levodopa (INBRIJA) 42 MG CAPS Place 42 mg into inhaler and inhale daily.   No facility-administered encounter medications on file as of 09/05/2018.     PAST  MEDICAL HISTORY:   Past Medical History:  Diagnosis Date  . Allergy   . Asthma   . Parkinson's disease (HCC)    Congenital R fourth nerve palsy    PAST SURGICAL HISTORY:   Past Surgical History:  Procedure Laterality Date  . CATARACT EXTRACTION, BILATERAL    . COLONOSCOPY    . SHOULDER SURGERY Left    lymph nodes  . TONSILLECTOMY    . VASECTOMY      SOCIAL HISTORY:   Social History   Socioeconomic History  . Marital status: Married    Spouse name: [name]  . Number of children: 3  . Years of education: Ph.D  . Highest education level: Not on file  Occupational History  . Occupation: retired    Associate Professor: Arts development officer    Comment: professor - Merchandiser, retail Needs  . Financial resource strain: Not on file  . Food insecurity:    Worry: Not on file    Inability: Not on file  . Transportation needs:    Medical: Not on file    Non-medical: Not on file  Tobacco Use  . Smoking status: Never Smoker  . Smokeless tobacco: Never Used  Substance and Sexual Activity  . Alcohol use: Yes    Alcohol/week: 0.0 standard drinks    Comment: two drinks a week  . Drug use: No    Comment: hemp oil  . Sexual activity: Not on file  Lifestyle  . Physical activity:    Days per week: Not on file    Minutes per session: Not on file  . Stress: Not on file  Relationships  . Social connections:    Talks on phone: Not on file    Gets together: Not on file    Attends religious service: Not on file    Active member of club or organization: Not on file      Attends meetings of clubs or organizations: Not on file    Relationship status: Not on file  . Intimate partner violence:    Fear of current or ex partner: Not on file    Emotionally abused: Not on file    Physically abused: Not on file    Forced sexual activity: Not on file  Other Topics Concern  . Not on file  Social History Narrative   Pt lives at home with family.   Caffeine Use: 1-2 cups daily.    FAMILY HISTORY:   Family Status  Relation Name Status  . Mother  Deceased  . Father  Deceased at age 72       lungs, kidney  . Brother  Alive  . MGM  (Not Specified)  . Child x3 Alive    ROS:  Review of Systems  Constitutional: Negative.   HENT: Negative.   Eyes: Negative.   Respiratory: Negative.   Cardiovascular: Negative.   Gastrointestinal: Negative.   Genitourinary: Negative.   Skin: Negative.     PHYSICAL EXAMINATION:    VITALS:   Vitals:   09/05/18 0837  BP: 102/64  Pulse: 76  SpO2: 93%  Weight: 170 lb (77.1 kg)  Height: 5' 9.5" (1.765 m)   GEN:  The patient appears stated age and is in NAD. HEENT:  Normocephalic, atraumatic.  The mucous membranes are moist. The superficial temporal arteries are without ropiness or tenderness. CV:  RRR Lungs:  CTAB Neck/HEME:  There are no carotid bruits bilaterally.  Neurological examination:  Orientation: The patient is alert and oriented x3. Cranial nerves:  There is good facial symmetry. The speech is fluent and clear. Soft palate rises symmetrically and there is no tongue deviation. Hearing is intact to conversational tone. Sensation: Sensation is intact to light touch throughout Motor: Strength is 5/5 in the bilateral upper and lower extremities.   Shoulder shrug is equal and symmetric.  There is no pronator drift.   Movement examination: Tone: There is mod increased tone in the L>RUE Abnormal movements: no tremor today Coordination:  There is mild trouble with finger taps on the L only.  Other RAMs are  good. Gait and Station: The patient gets OOC on first attempt without hands. He walks really well down the hall  ASSESSMENT/PLAN:  1.  PD, dx 06/1999  -He will continue Stalevo 2 tablets at 6:30 am, 1 tablet at 9:00 am, 1 tablets at 11:30 am, 2 tablet at 2 pm, 1 tablet at 4:30 pm, 1 tablet at 7:00 pm.   Talked about importance of staying on time with medication (wife states that been off timing for 3 weeks and has made big - negative - difference)  -He will continue carbidopa/levodopa 50/200 CR at bedtime (930 PM).    -tried to wean off of pramipexole last visit but didn't do it.  Gave weaning schedule again.  Only on 0.25 mg tid.  Wean over next few weeks d/t memory/hallucintions.  -didn't like inbrija  -for now, he will continue azilect but would like to d/c in the future to simplify regiment  -The patient is currently using CBD oil.   Discussed again recommendation of AAN.  Talked about smaller trials in which marijuana helped tremor.  However, there is some data that suggests that it worsens cognition and falls.  The data also suggests that CBD oil is less effective than marijuana.  However, there have been concerns given the fact that CBD oil is unregulated and each manufacturer has different amounts of ingredient and the purity of each manufacturers ingredient has been called into question.  At this time, it is not recommended for the treatment of Parkinson's disease.  Further studies do need to be completed.    2.  REM behavior disorder  -This is commonly associated with PD and the patient is experiencing this.  We discussed that this can be very serious and even harmful.  We talked about medications as well as physical barriers to put in the bed (particularly soft bed rails, pillow barriers).  We talked about moving the night stand so that it is not so close to the side of the bed.  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  3.  Mild cognitive impairment  -Patient had neurocognitive testing on October 31, 2017.  There was only evidence of mild cognitive impairment, and no dementia.  4.  F/u 4 months.  Much greater than 50% of this visit was spent in counseling and coordinating care.  Total face to face time:  25 min   Cc:  Martha Clan, MD

## 2018-09-05 ENCOUNTER — Ambulatory Visit (INDEPENDENT_AMBULATORY_CARE_PROVIDER_SITE_OTHER): Payer: Medicare Other | Admitting: Neurology

## 2018-09-05 ENCOUNTER — Encounter: Payer: Self-pay | Admitting: Neurology

## 2018-09-05 VITALS — BP 102/64 | HR 76 | Ht 69.5 in | Wt 170.0 lb

## 2018-09-05 DIAGNOSIS — R441 Visual hallucinations: Secondary | ICD-10-CM

## 2018-09-05 DIAGNOSIS — G20 Parkinson's disease: Secondary | ICD-10-CM | POA: Diagnosis not present

## 2018-09-05 NOTE — Patient Instructions (Signed)
Decrease pramipexole to 0.5 mg - 1/2 tablet twice a day for a week, then 1/2 tablet once a day for a week and then stop the medication. Let me know if you have any problems with it.  Continue your other medications.  Stay on time with those!

## 2018-09-07 ENCOUNTER — Other Ambulatory Visit: Payer: Self-pay | Admitting: Neurology

## 2018-09-07 DIAGNOSIS — G20 Parkinson's disease: Secondary | ICD-10-CM

## 2018-10-09 ENCOUNTER — Telehealth: Payer: Self-pay | Admitting: Physical Therapy

## 2018-10-09 DIAGNOSIS — G20 Parkinson's disease: Secondary | ICD-10-CM

## 2018-10-09 NOTE — Telephone Encounter (Signed)
Upon discharge from therapies in May 2019, recommendations were to follow up with PT, OT, speech evaluations in 6-9 months.  Pt is scheduled for these evaluations in January.  If you agree to patient return for therapy evaluations, please send orders for PT, OT, speech therapy via Epic.  Thank you.    Lonia Bloodmy Dara Camargo, PT 10/09/18 11:53 AM Phone: [phone] Fax: [phone]

## 2018-10-09 NOTE — Telephone Encounter (Signed)
Order entered

## 2018-10-27 ENCOUNTER — Other Ambulatory Visit: Payer: Self-pay | Admitting: Neurology

## 2018-10-27 DIAGNOSIS — R413 Other amnesia: Secondary | ICD-10-CM

## 2018-10-27 DIAGNOSIS — G20 Parkinson's disease: Secondary | ICD-10-CM

## 2018-11-04 ENCOUNTER — Ambulatory Visit: Payer: Medicare Other | Admitting: Occupational Therapy

## 2018-11-04 ENCOUNTER — Ambulatory Visit: Payer: Medicare Other

## 2018-11-04 ENCOUNTER — Ambulatory Visit: Payer: Medicare Other | Attending: Neurology | Admitting: Physical Therapy

## 2018-11-04 ENCOUNTER — Encounter: Payer: Self-pay | Admitting: Physical Therapy

## 2018-11-04 DIAGNOSIS — R2681 Unsteadiness on feet: Secondary | ICD-10-CM

## 2018-11-04 DIAGNOSIS — R29818 Other symptoms and signs involving the nervous system: Secondary | ICD-10-CM

## 2018-11-04 DIAGNOSIS — R41844 Frontal lobe and executive function deficit: Secondary | ICD-10-CM | POA: Insufficient documentation

## 2018-11-04 DIAGNOSIS — R278 Other lack of coordination: Secondary | ICD-10-CM | POA: Insufficient documentation

## 2018-11-04 DIAGNOSIS — R41842 Visuospatial deficit: Secondary | ICD-10-CM | POA: Insufficient documentation

## 2018-11-04 DIAGNOSIS — R41841 Cognitive communication deficit: Secondary | ICD-10-CM | POA: Insufficient documentation

## 2018-11-04 DIAGNOSIS — R2689 Other abnormalities of gait and mobility: Secondary | ICD-10-CM

## 2018-11-04 DIAGNOSIS — R29898 Other symptoms and signs involving the musculoskeletal system: Secondary | ICD-10-CM | POA: Insufficient documentation

## 2018-11-04 DIAGNOSIS — R293 Abnormal posture: Secondary | ICD-10-CM | POA: Diagnosis present

## 2018-11-04 DIAGNOSIS — R471 Dysarthria and anarthria: Secondary | ICD-10-CM | POA: Insufficient documentation

## 2018-11-04 NOTE — Patient Instructions (Signed)
Generate 10 sentences you say everyday and bring to therapy.  Practice your loud "ah" again - 5 reps twice each day- do these in your car.

## 2018-11-04 NOTE — Therapy (Signed)
[facility] [address] [place], [place], [address] Phone: [phone]   Fax:  [phone]  Speech Language Pathology Evaluation  Patient Details  Name: Zachary [name] MRN: [id number] Date of Birth: Oct 21, 1951 Referring Provider (SLP): [name], DO   Encounter Date: 11/04/2018  End of Session - 11/04/18 1705    Visit Number  1    Number of Visits  17   (possibly 6 weeks/13 visits due to cognitive abilities?)   Date for SLP Re-Evaluation  01/30/19   90 days   SLP Start Time  1540    SLP Stop Time   1615    SLP Time Calculation (min)  35 min    Activity Tolerance  Patient tolerated treatment well       Past Medical History:  Diagnosis Date  . Allergy   . Asthma   . Parkinson's disease (HCC)    Congenital R fourth nerve palsy    Past Surgical History:  Procedure Laterality Date  . CATARACT EXTRACTION, BILATERAL    . COLONOSCOPY    . SHOULDER SURGERY Left    lymph nodes  . TONSILLECTOMY    . VASECTOMY      There were no vitals filed for this visit.  Subjective Assessment - 11/04/18 1553    Subjective  Wife with pt today; both agree pt is softer than at d/c in June 2019.     Patient is accompained by:  Family member   wife   Currently in Pain?  No/denies         SLP Evaluation [facility] - 11/04/18 1553      SLP Visit Information   SLP Received On  11/04/18    Referring Provider (SLP)  [name], Lurena Joiner, DO    Onset Date  approx 1999    Medical Diagnosis  Parkinson's Disease      Subjective   Patient/Family Stated Goal  "Get louder - I've really failed with this."      General Information   HPI  Pt well known from previous course of therapy in spring 2019. Discharged with loudness in mid to upper 60s dB mostly due to cognitive/divided attention deficits.     Behavioral/Cognition  Impulsive      Prior Functional Status   Cognitive/Linguistic Baseline  Baseline deficits    Baseline deficit details   MCI ID'd from Dr. Nigel Sloop evaluation; attention, awareness, insight      Cognition   Overall Cognitive Status  History of cognitive impairments - at baseline    Attention  Selective    Selective Attention  Impaired    Memory  Impaired    Memory Impairment  Decreased short term memory    Awareness  Impaired    Awareness Impairment  Intellectual impairment;Emergent impairment;Anticipatory impairment    Behaviors  Impulsive;Restless   pt fidgety in his chair     Auditory Comprehension   Overall Auditory Comprehension  Appears within functional limits for tasks assessed      Verbal Expression   Overall Verbal Expression  Appears within functional limits for tasks assessed      Oral Motor/Sensory Function   Overall Oral Motor/Sensory Function  Impaired    Labial ROM  Reduced right;Reduced left    Labial Symmetry  Within Functional Limits    Labial Strength  Reduced    Labial Coordination  Reduced    Lingual ROM  Reduced left    Lingual Symmetry  Within Functional Limits    Lingual  Strength  Reduced    Lingual Sensation  Within Functional Limits    Lingual Coordination  Reduced    Overall Oral Motor/Sensory Function  noted intermittent pausing/hesitation in alternate repetitive motion tasks in lingual and labial tasks      Motor Speech   Overall Motor Speech  Impaired    Respiration  Impaired   speaking on residual volume   Level of Impairment  Word    Phonation  Low vocal intensity;Breathy   average low-mid 60s dB   Articulation  Impaired    Level of Impairment  Phrase   occasional rushes of speech   Intelligibility  Intelligible    Conversation  --   95%+   Interfering Components  Premorbid status   (cognition)   Effective Techniques  Increased vocal intensity   mid 60sdB-45 second monologue-rare min cues;varied loudness   Phonation  --   varied loudness after loud /a/- low to upper 60s dB     Loud /a/ was produced initially mid 80s dB, and with consistent min cues  for breath support (full breath) and usual A for loudness, pt increased to low 90s dB. After loud /a/ reps, pt 45-second monologue was with average mid 60s dB with rare min cues for loudness. Loudness level varied from low 60s to upper 60s, likely due to pt's difficulty with dual-tasking between speech loudness and the content of his message.               SLP Education - 11/04/18 1704    Education provided  Yes    Education Details  eval results, duration and frequency of ST    Person(s) Educated  Patient;Spouse    Methods  Explanation    Comprehension  Verbalized understanding;Need further instruction       SLP Short Term Goals - 11/04/18 1726      SLP SHORT TERM GOAL #1   Title  Pt will achieve average loud /a/ of average low 90sdB over 3 sessions    Time  4    Period  Weeks    Status  New      SLP SHORT TERM GOAL #2   Title  Pt will use abdominal breathing at rest 70% success over 2 sessions    Time  4    Period  Weeks    Status  New      SLP SHORT TERM GOAL #3   Title  Pt will use speech volume average low 70sdB when responding with sentences 18/20 over two sessions    Time  4    Period  Weeks    Status  New       SLP Long Term Goals - 11/04/18 1727      SLP LONG TERM GOAL #1   Title  Pt will generate loud /a/ with average of low 90s dB over 6 sessions    Time  8    Period  --   or 17 sessions   Status  New      SLP LONG TERM GOAL #2   Title  Pt will use abdominal breathing 50% of the time in 5 minutes simple conversation over two sessions    Time  8    Period  Weeks   or 17 sessions   Status  New   for focus on LOUD speech     SLP LONG TERM GOAL #3   Title  Pt will use speech volume average mid-upper 60sdB in 5 minutes  of simple conversation with rare min A over 2 sessions    Time  8    Period  Weeks   or 17 sessions   Status  New      SLP LONG TERM GOAL #4   Title  pt and/or wife will tell SLP 3 ways to maximize environmental surroundings  in order to improve pt communicative successes over three sessions    Time  8    Period  Weeks    Status  New       Plan - 11/04/18 1719    Clinical Impression Statement  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 With focus on LOUD speech and incr'd effort pt averaged in mid 60s dB in a 45-second monologue (decr'd from previous therapy course in May 2019 - pt incr'd loudness to approx 70dB in two minutes monologue). Pt also has significant impulsivity - MCI was ID'd from neuropsych testing. Cognitive ability is the primary factor that limited pt's progress last therapy course due to a notable difficulty in dual-tasking speech loudness with planning his speech content. He would benefit from a course of skilled ST focusing mainly on increasing loudness in conversation to improve pt's QOL, and in education for pt/wife how to manipulate the environment for incr'd communicative success.     Speech Therapy Frequency  2x / week    Duration  --   8 weeks, or 17 total sessions   Treatment/Interventions  Compensatory techniques;Functional tasks;SLP instruction and feedback;Environmental controls;Multimodal communcation approach;Internal/external aids;Patient/family education;Cognitive reorganization;Cueing hierarchy    Potential to Achieve Goals  Good    Potential Considerations  Ability to learn/carryover information;Severity of impairments    Consulted and Agree with Plan of Care  Patient;Family member/caregiver    Family Member Consulted  wife       Patient will benefit from skilled therapeutic intervention in order to improve the following deficits and impairments:   Dysarthria and anarthria - Plan: SLP plan of care cert/re-cert  Cognitive communication deficit - Plan: SLP plan of care  cert/re-cert    Problem List Patient Active Problem List   Diagnosis Date Noted  . Parkinson's disease (HCC) 05/04/2013    [facility] ,MS, CCC-SLP  11/04/2018, 5:35 PM  [facility] [address] [place], [place], [address] Phone: [phone]   Fax:  [phone]  Name: Zachary [name] MRN: [id number] Date of Birth: 05/21/1951

## 2018-11-05 NOTE — Therapy (Signed)
[facility] [address] [place], [place], [address] Phone: [phone]   Fax:  [phone]  Physical Therapy Evaluation  Patient Details  Name: Zachary LahStuart D [name] MRN: [id number] Date of Birth: December 10, 1950 Referring Provider (PT): Dr. Arbutus Leasat   Encounter Date: 11/04/2018  PT End of Session - 11/05/18 1209    Visit Number  1    Number of Visits  9    Date for PT Re-Evaluation  02/03/19    Authorization Type  [employer] Medicare    PT Start Time  1402    PT Stop Time  1449    PT Time Calculation (min)  47 min    Activity Tolerance  Patient tolerated treatment well    Behavior During Therapy  [facility] for tasks assessed/performed       Past Medical History:  Diagnosis Date  . Allergy   . Asthma   . Parkinson's disease (HCC)    Congenital R fourth nerve palsy    Past Surgical History:  Procedure Laterality Date  . CATARACT EXTRACTION, BILATERAL    . COLONOSCOPY    . SHOULDER SURGERY Left    lymph nodes  . TONSILLECTOMY    . VASECTOMY      There were no vitals filed for this visit.   Subjective Assessment - 11/04/18 1405    Subjective  Wife reports having 2 falls in the past 6 months-[place], then second was downhill, shuffling down the hill.  Is able to get up pretty quickly.  Wife reports sometimes manic episodes where he just goes very fast and he can't remember what he's done.  Sometimes forgets medications.    Patient is accompained by:  Family member   wife   Patient Stated Goals  To try not to deteriorate    Currently in Pain?  No/denies         [facility] - 11/04/18 1413      Assessment   Medical Diagnosis  Parkinson's disease    Referring Provider (PT)  Dr. Arbutus Leasat    Onset Date/Surgical Date  09/05/18   MD visit   Hand Dominance  Right      Precautions   Precautions  Fall    Precaution Comments  Not driving      Balance Screen   Has the patient fallen in the past 6 months   Yes    How many times?  2   per family member report, pt does not recall   Has the patient had a decrease in activity level because of a fear of falling?   No    Is the patient reluctant to leave their home because of a fear of falling?   No      Home Public house managernvironment   Living Environment  Private residence    Living Arrangements  Spouse/significant other    Available Help at Discharge  Family    Type of Home  House    Home Access  Stairs to enter    Entrance Stairs-Number of Steps  4    Entrance Stairs-Rails  Right    Home Layout  Two level;Able to live on main level with bedroom/bathroom   Information systems managerffice upstairs   Home Equipment  Transport chair;Shower seat;Cane - single point;Walker - 4 wheels      Prior Function   Level of Independence  Independent with gait;Needs assistance with ADLs    Vocation  Retired    Leisure  [employer] once per week; ACT 4-5x/wk; [employer] 1-2 times per week;    Occasional yoga class, just started back to [employer]     Observation/Other Assessments   Focus on Therapeutic Outcomes (FOTO)   NA      Posture/Postural Control   Posture/Postural Control  Postural limitations    Postural Limitations  Flexed trunk    Posture Comments  Pt tends to keep eyes and head looking downward with standing and gait.  With visual tracking, pt noted to slowly track up, then quickly track down to middle, even before pen arrives to midline.  Pt loses sight of pen in downward gaze direction.      Transfers   Transfers  Sit to Stand;Stand to Sit    Sit to Stand  6: Modified independent (Device/Increase time);Without upper extremity assist;From bed    Five time sit to stand comments   5.66    Stand to Sit  6: Modified independent (Device/Increase time);To bed;Without upper extremity assist      Ambulation/Gait   Ambulation/Gait  Yes    Ambulation/Gait Assistance  5: Supervision    Ambulation Distance (Feet)  300 Feet    Assistive device  None    Gait Pattern   Step-through pattern;Decreased arm swing - right;Decreased arm swing - left;Narrow base of support;Trunk flexed    Ambulation Surface  Level;Indoor    Gait velocity  8 sec = 4.1 ft/sec      Standardized Balance Assessment   Standardized Balance Assessment  Timed Up and Go Test      Timed Up and Go Test   TUG  Normal TUG;Manual TUG;Cognitive TUG    Normal TUG (seconds)  8.37    Manual TUG (seconds)  10.78    Cognitive TUG (seconds)  9.16    TUG Comments  >10% difference with TUG and TUG manual, indicating difficulty with dual tasking      High Level Balance   High Level Balance Comments  MiniBESTest score:  17/28 (See full note for details); pt's MiniBESTest score has decreased from 26/28 last bout of therapy to 17/28 at current eval        Mini-BESTest: Balance Evaluation Systems Test  2005-2013 [facility] & [employer]. All rights reserved. ________________________________________________________________________________________Anticipatory_________Subscore___3__/6 1. SIT TO STAND Instruction: "Cross your arms across your chest. Try not to use your hands unless you must.Do not let your legs lean against the back of the chair when you stand. Please stand up now." X(2) Normal: Comes to stand without use of hands and stabilizes independently. (1) Moderate: Comes to stand WITH use of hands on first attempt. (0) Severe: Unable to stand up from chair without assistance, OR needs several attempts with use of hands. 2. RISE TO TOES Instruction: "Place your feet shoulder width apart. Place your hands on your hips. Try to rise as high as you can onto your toes. I will count out loud to 3 seconds. Try to hold this pose for at least 3 seconds. Look straight ahead. Rise now." (2) Normal: Stable for 3 s with maximum height. (1) Moderate: Heels up, but not full range (smaller than when holding hands), OR noticeable instability for 3 s. X(0) Severe: < 3 s. 3. STAND ON ONE  LEG Instruction: "Look straight ahead. Keep your hands on your hips. Lift your leg off of the ground behind you without touching or resting your raised leg upon your other standing leg. Stay standing on one leg as long as you  can. Look straight ahead. Lift now." Left: Time in Seconds Trial 1:__2.28___Trial 2:__2.41___ (2) Normal: 20 s. X(1) Moderate: < 20 s. (0) Severe: Unable. Right: Time in Seconds Trial 1:__16___Trial 2:__16.38___ (2) Normal: 20 s. X(1) Moderate: < 20 s. (0) Severe: Unable To score each side separately use the trial with the longest time. To calculate the sub-score and total score use the side [left or right] with the lowest numerical score [i.e. the worse side]. ______________________________________________________________________________________Reactive Postural Control___________Subscore:___3__/6 4. COMPENSATORY STEPPING CORRECTION- FORWARD Instruction: "Stand with your feet shoulder width apart, arms at your sides. Lean forward against my hands beyond your forward limits. When I let go, do whatever is necessary, including taking a step, to avoid a fall." X(2) Normal: Recovers independently with a single, large step (second realignment step is allowed). (1) Moderate: More than one step used to recover equilibrium. (0) Severe: No step, OR would fall if not caught, OR falls spontaneously. 5. COMPENSATORY STEPPING CORRECTION- BACKWARD Instruction: "Stand with your feet shoulder width apart, arms at your sides. Lean backward against my hands beyond your backward limits. When I let go, do whatever is necessary, including taking a step, to avoid a fall." (2) Normal: Recovers independently with a single, large step. (1) Moderate: More than one step used to recover equilibrium. X(0) Severe: No step, OR would fall if not caught, OR falls spontaneously. 6. COMPENSATORY STEPPING CORRECTION- LATERAL Instruction: "Stand with your feet together, arms down at your sides. Lean  into my hand beyond your sideways limit. When I let go, do whatever is necessary, including taking a step, to avoid a fall." Left (2) Normal: Recovers independently with 1 step (crossover or lateral OK). X(1) Moderate: Several steps to recover equilibrium. (0) Severe: Falls, or cannot step. Right (2) Normal: Recovers independently with 1 step (crossover or lateral OK). X(1) Moderate: Several steps to recover equilibrium. (0) Severe: Falls, or cannot step. Use the side with the lowest score to calculate sub-score and total score. ____________________________________________________________________________________Sensory Orientation_____________Subscore:_______6__/6 7. STANCE (FEET TOGETHER); EYES OPEN, FIRM SURFACE Instruction: "Place your hands on your hips. Place your feet together until almost touching. Look straight ahead. Be as stable and still as possible, until I say stop." Time in seconds:________ X(2) Normal: 30 s. (1) Moderate: < 30 s. (0) Severe: Unable. 8. STANCE (FEET TOGETHER); EYES CLOSED, FOAM SURFACE Instruction: "Step onto the foam. Place your hands on your hips. Place your feet together until almost touching. Be as stable and still as possible, until I say stop. I will start timing when you close your eyes." Time in seconds:________ X(2) Normal: 30 s. (1) Moderate: < 30 s. (0) Severe: Unable. 9. INCLINE- EYES CLOSED Instruction: "Step onto the incline ramp. Please stand on the incline ramp with your toes toward the top. Place your feet shoulder width apart and have your arms down at your sides. I will start timing when you close your eyes." Time in seconds:________ X(2) Normal: Stands independently 30 s and aligns with gravity. (1) Moderate: Stands independently <30 s OR aligns with surface. (0) Severe: Unable. _________________________________________________________________________________________Dynamic Gait ______Subscore______5__/10 10. CHANGE IN GAIT  SPEED Instruction: "Begin walking at your normal speed, when I tell you 'fast', walk as fast as you can. When I say 'slow', walk very slowly." X(2) Normal: Significantly changes walking speed without imbalance. (1) Moderate: Unable to change walking speed or signs of imbalance. (0) Severe: Unable to achieve significant change in walking speed AND signs of imbalance. 11. WALK WITH HEAD TURNS - HORIZONTAL Instruction: "Begin walking  at your normal speed, when I say "right", turn your head and look to the right. When I say "left" turn your head and look to the left. Try to keep yourself walking in a straight line." (2) Normal: performs head turns with no change in gait speed and good balance. X(1) Moderate: performs head turns with reduction in gait speed. (0) Severe: performs head turns with imbalance. 12. WALK WITH PIVOT TURNS Instruction: "Begin walking at your normal speed. When I tell you to 'turn and stop', turn as quickly as you can, face the opposite direction, and stop. After the turn, your feet should be close together." (2) Normal: Turns with feet close FAST (< 3 steps) with good balance. (1) Moderate: Turns with feet close SLOW (>4 steps) with good balance. X(0) Severe: Cannot turn with feet close at any speed without imbalance.-Pivot turns without moving feet 13. STEP OVER OBSTACLES Instruction: "Begin walking at your normal speed. When you get to the box, step over it, not around it and keep walking." (2) Normal: Able to step over box with minimal change of gait speed and with good balance. X(1) Moderate: Steps over box but touches box OR displays cautious behavior by slowing gait. (0) Severe: Unable to step over box OR steps around box. 14. TIMED UP & GO WITH DUAL TASK [3 METER WALK] Instruction TUG: "When I say 'Go', stand up from chair, walk at your normal speed across the tape on the floor, turn around, and come back to sit in the chair." Instruction TUG with Dual Task:  "Count backwards by threes starting at ___. When I say 'Go', stand up from chair, walk at your normal speed across the tape on the floor, turn around, and come back to sit in the chair. Continue counting backwards the entire time." TUG: ________seconds; Dual Task TUG: ________seconds (2) Normal: No noticeable change in sitting, standing or walking while backward counting when compared to TUG without Dual Task. X(1) Moderate: Dual Task affects either counting OR walking (>10%) when compared to the TUG without Dual Task. (0) Severe: Stops counting while walking OR stops walking while counting. When scoring item 14, if subject's gait speed slows more than 10% between the TUG without and with a Dual Task the score should be decreased by a point. TOTAL SCORE: _____17___/28          Objective measurements completed on examination: See above findings.              PT Education - 11/05/18 1208    Education provided  Yes    Education Details  Fall risk/changes in MiniBESTest score, benefit of therapy     Person(s) Educated  Patient;Spouse    Methods  Explanation    Comprehension  Verbalized understanding          PT Long Term Goals - 11/05/18 1225      PT LONG TERM GOAL #1   Title  Pt/wife will verbalize understanding of fall prevention, including freezing with gait, in home environment.  (may be delayed start, due to pt prioritizing speech, then PT)    Time  4    Period  Weeks    Status  New      PT LONG TERM GOAL #2   Title  Pt will perform HEP with wife's supervision for improved balance, gait, posture.    Time  4    Period  Weeks    Status  New      PT LONG TERM GOAL #  3   Title  Pt will improve MiniBESTest score to at least 22/28 for decreased fall risk.    Time  4    Period  Weeks    Status  New      PT LONG TERM GOAL #4   Title  Pt will improve TUG and TUG manual to <10% difference, for improved ability for dual tasking.    Time  4    Period  Weeks     Status  New             Plan - 11/05/18 1209    Clinical Impression Statement  Pt is a 68 year old male with history of Parkinson's disease, who presents today for 58-month follow-up evaluation.  He presents with 2 falls in past 6 months, decreased timing and coordination of gait, freezing episodes of gait, festination of gait with initiation of gait, decreased balance, abnormal posture.  Pt has demonstrated decline in MiniBESTest score since d/c May 2019, with decreased score from 26/28 to 17/28. Of note, pt has uncontrolled stepping in posterior direciton with push and release test, needing therapist to regain balance.  Also, he has difficulty visually tracking object in downward direction. Pt would benefit from skilled PT to address the above stated deficits for decreased fall risk and improved functional mobility and safety.    History and Personal Factors relevant to plan of care:  Hx of PD since 2000, reports 2 falls in past 6 months    Clinical Presentation  Evolving    Clinical Presentation due to:  decrease in MiniBEStest score 26/28>17/28    Clinical Decision Making  Moderate    Rehab Potential  Fair    Clinical Impairments Affecting Rehab Potential  cognition    PT Frequency  2x / week    PT Duration  4 weeks   may be delayed start due to pt preferring to start Speech first   PT Treatment/Interventions  ADLs/Self Care Home Management;Therapeutic exercise;Therapeutic activities;Functional mobility training;Stair training;Gait training;Balance training;Neuromuscular re-education;Patient/family education    PT Next Visit Plan  Initiate HEP for balance; discuss tips to reduce festination/freezing episodes with gait, posterior stepping and balance recovery    Consulted and Agree with Plan of Care  Patient;Family member/caregiver       Patient will benefit from skilled therapeutic intervention in order to improve the following deficits and impairments:  Abnormal gait, Decreased  balance, Decreased safety awareness, Difficulty walking, Decreased mobility, Postural dysfunction  Visit Diagnosis: Other abnormalities of gait and mobility  Unsteadiness on feet  Abnormal posture  Other symptoms and signs involving the nervous system     Problem List Patient Active Problem List   Diagnosis Date Noted  . Parkinson's disease (HCC) 05/04/2013    Zachary [name] W. 11/05/2018, 3:24 PM Lonia Blood, PT 11/05/18 3:35 PM Phone: [phone] Fax: [phone]   [facility] [address] [place], [place], [address] Phone: [phone]   Fax:  [phone]  Name: Zachary [name] MRN: [id number] Date of Birth: 08/07/1951

## 2018-11-05 NOTE — Therapy (Signed)
[facility] [address] [place], [place], [address] Phone: [phone]   Fax:  [phone]  Occupational Therapy Treatment  Patient Details  Name: Zachary LahStuart D [name] MRN: [id number] Date of Birth: 25-Apr-1951 Referring Provider (OT): Dr. Arbutus Leasat   Encounter Date: 11/04/2018  OT End of Session - 11/05/18 0842    Visit Number  1    Number of Visits  13    Date for OT Re-Evaluation  02/03/19    Authorization Type  [employer] Medicare    Authorization Time Period  12 weeks, pt requests to delay start of OT so that he can receive ST first    OT Start Time  1450    OT Stop Time  1530    OT Time Calculation (min)  40 min    Activity Tolerance  Patient tolerated treatment well    Behavior During Therapy  [facility] for tasks assessed/performed       Past Medical History:  Diagnosis Date  . Allergy   . Asthma   . Parkinson's disease (HCC)    Congenital R fourth nerve palsy    Past Surgical History:  Procedure Laterality Date  . CATARACT EXTRACTION, BILATERAL    . COLONOSCOPY    . SHOULDER SURGERY Left    lymph nodes  . TONSILLECTOMY    . VASECTOMY      There were no vitals filed for this visit.      Zachary [name], [facility] OT Assessment - 11/05/18 0001      Assessment   Medical Diagnosis  Parkinson's disease    Referring Provider (OT)  Dr. Arbutus Leasat    Onset Date/Surgical Date  09/05/18    Hand Dominance  Right      Precautions   Precautions  Fall    Precaution Comments  Not driving      Balance Screen   Has the patient fallen in the past 6 months  Yes    How many times?  4    Has the patient had a decrease in activity level because of a fear of falling?   No    Is the patient reluctant to leave their home because of a fear of falling?   No      Home  Environment   Family/patient expects to be discharged to:  Private residence    Home Access  Stairs    Home Layout  Two level    Lives With  Spouse      Prior Function   Level of  Independence  Independent with gait;Needs assistance with ADLs    Vocation  Retired    Leisure  [employer] Moves once per week; ACT 4-5x/wk; [employer] boxing 1-2 times per week;       ADL   Eating/Feeding  Modified independent   increased spills, difficulty with timing   Grooming  Modified independent    Upper Body Bathing  Modified independent    Lower Body Bathing  Modified independent   increased time   Upper Body Dressing  Needs assist for fasteners;Minimal assistance    Lower Body Dressing  Modified independent    Toilet Transfer  Modified independent    Tub/Shower Transfer  Modified independent   walk in shower with bench   ADL comments  difficulty with remote, using phone, clearing phone screen,  difficulty scooting chair forward   gets fingers in food when preparing food     IADL   Shopping  --   Does not shop  Light Housekeeping  --   runs vacuum, wipes down shower, uses microwave   Meal Prep  Needs to have meals prepared and served    Medication Management  Has difficulty remembering to take medication   Pt's wife prepares   Financial Management  Dependent      Mobility   Mobility Status  History of falls;Freezing      Written Expression   Handwriting  --   to be assessed history of difficulty with hand writing     Vision - History   Additional Comments  hx of depth perception problems      Vision Assessment   Vision Assessment  Vision impaired  _ to be further tested in functional context    Comment  Pt appears to have visual perceptual difficulties   He misjudges distances and places items on edge of countertop     Cognition   Overall Cognitive Status  Impaired/Different from baseline    Attention  Selective    Selective Attention  Impaired    Memory  Impaired    Memory Impairment  Decreased short term memory    Awareness  Impaired    Awareness Impairment  Intellectual impairment;Emergent impairment;Anticipatory impairment    Problem Solving  Impaired     Behaviors  Impulsive;Restless      Observation/Other Assessments   Simulated Eating Time (seconds)  11.63   Pt's wife reports pt splills due to eating too fast   Donning Doffing Jacket Comments  13.06 secs   3 button/ unbutton time 1 min 39 secs     Posture/Postural Control   Posture/Postural Control  Postural limitations    Postural Limitations  Flexed trunk      Coordination   Gross Motor Movements are Fluid and Coordinated  No    Fine Motor Movements are Fluid and Coordinated  No    Right 9 Hole Peg Test  30.94 secs    Left 9 Hole Peg Test  29.62 secs                         OT Short Term Goals - 11/05/18 0846      OT SHORT TERM GOAL #1   Title  I with PD specific HEP    Time  4    Period  Weeks    Status  New      OT SHORT TERM GOAL #2   Title  Pt will verbalize understanding of adapted strategies for ADLS/IADLS in order to maximize safety and independence.    Time  4    Period  Weeks    Status  New      OT SHORT TERM GOAL #3   Title  Pt will demonstrate improved independence with dressing as evidenced by decreasing 3 button/ unbutton time to 90 secs or less    Baseline  99 secs    Time  4    Period  Weeks    Status  New      OT SHORT TERM GOAL #4   Title  Pt/ wife will verbalize understanding of compensatory strategies for visual perceptual deficits.    Time  4    Period  Weeks    Status  New      OT SHORT TERM GOAL #5   Title  Assess shoulder ROM and set goal for functional reach prn    Time  4    Period  Weeks    Status  New      OT SHORT TERM GOAL #6   Title  ------------------------------------        OT Long Term Goals - 11/05/18 0848      OT LONG TERM GOAL #1   Title  Pt will verbalize understanding of community resources and ways to prevent future PD related complications.    Time  8    Period  Weeks    Status  New      OT LONG TERM GOAL #2   Title  Pt will verbalize understanding of compensatory strategies for short  term memory deficits and ways to keep thinking skills sharp.    Time  8    Period  Weeks    Status  New      OT LONG TERM GOAL #3   Title  Pt will demonstrate ability to write a short paragraph with 100% legibility and minimal decrease in letter size    Time  8    Period  Weeks    Status  New      OT LONG TERM GOAL #4   Title  -------------------------------------            Plan - 11/05/18 0844    Clinical Impression Statement  Pt is a 68 y.o male diagnosed with Parkinson's disease in 2000.  Pt presents with the following deficits which impede performance of ADLS/IADLS: decreased coordination, bradykinesia, freezing episodes, decreased balance, cognitive deficits, rigidity, and visual perceptual deficits. Pt can benefit from skilled occupational therapy to maximize pt's safety and independence with daily activities and to maintain quality of life.     Occupational Profile and client history currently impacting functional performance  PMH: PD diagnosed in 2000, asthma. 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 Pt participates in [employer] moves    Occupational performance deficits (Please refer to evaluation for details):  IADL's;ADL's;Rest and Sleep;Leisure;Social Participation    Rehab Potential  Good    Current Impairments/barriers affecting progress:  cognitive deficits, visual perceptual deficits, length of time since inital onset    OT Frequency  2x / week   plus eval   OT Duration  8 weeks    OT Treatment/Interventions  Self-care/ADL training;Therapeutic exercise;Visual/perceptual remediation/compensation;Patient/family education;Gait Training;Neuromuscular education;Moist Heat;Fluidtherapy;Energy conservation;Building services engineerunctional Mobility Training;Therapeutic activities;Cognitive remediation/compensation;Passive range of motion;Manual Therapy;DME and/or AE instruction;Ultrasound;Cryotherapy    Plan  education regarding ADL strategies/ safety    Consulted and Agree with  Plan of Care  Patient    Family Member Consulted  wife       Patient will benefit from skilled therapeutic intervention in order to improve the following deficits and impairments:  Abnormal gait, Decreased knowledge of use of DME, Impaired flexibility, Impaired vision/preception, Pain, Decreased mobility, Decreased coordination, Decreased activity tolerance, Decreased endurance, Decreased range of motion, Decreased strength, Impaired tone, Impaired UE functional use, Impaired perceived functional ability, Difficulty walking, Decreased safety awareness, Decreased knowledge of precautions, Decreased balance  Visit Diagnosis: Other symptoms and signs involving the nervous system - Plan: Ot plan of care cert/re-cert  Other lack of coordination - Plan: Ot plan of care cert/re-cert  Frontal lobe and executive function deficit - Plan: Ot plan of care cert/re-cert  Abnormal posture - Plan: Ot plan of care cert/re-cert  Unsteadiness on feet - Plan: Ot plan of care cert/re-cert  Other abnormalities of gait and mobility - Plan: Ot plan of care cert/re-cert  Other symptoms and signs involving the musculoskeletal system - Plan: Ot plan of care cert/re-cert  Visuospatial deficit - Plan: Ot plan of care cert/re-cert    Problem List Patient Active Problem List   Diagnosis Date Noted  . Parkinson's disease (HCC) 05/04/2013    [name],[name] 11/05/2018, 9:06 AM Keene Breath, OTR/L [phone] Phone: [phone] 9:07 AM 11/05/18 [name] [address] [place], [place], [address] Phone: [phone]   Fax:  [phone]  Name: Zachary [name] MRN: [id number] Date of Birth: 1951/01/05

## 2018-11-07 ENCOUNTER — Ambulatory Visit: Payer: Medicare Other

## 2018-11-07 DIAGNOSIS — R2689 Other abnormalities of gait and mobility: Secondary | ICD-10-CM | POA: Diagnosis not present

## 2018-11-07 DIAGNOSIS — R471 Dysarthria and anarthria: Secondary | ICD-10-CM

## 2018-11-07 DIAGNOSIS — R41841 Cognitive communication deficit: Secondary | ICD-10-CM

## 2018-11-07 NOTE — Patient Instructions (Addendum)
   What are you having for lunch?  Are you going to walk today?  Was the cat left out last night?  Are you going to watch [employer]?  What is for dinner?  [employer] here!  Are you finished with the sports page?  Has the mail come?  Do you want me to take a shower now?   READ THESE LOUDLY, TWICE EACH DAY, AFTER SEVEN LOUD "AH"s TWICE EACH DAY

## 2018-11-07 NOTE — Therapy (Signed)
[facility] [address] [place], [place], [address] Phone: [phone]   Fax:  [phone]  Speech Language Pathology Treatment  Patient Details  Name: Zachary [name] MRN: [id number] Date of Birth: 1951/07/14 Referring Provider (SLP): Kerin Salenat, Rebecca, DO   Encounter Date: 11/07/2018  End of Session - 11/07/18 1623    Visit Number  2    Number of Visits  17    Date for SLP Re-Evaluation  01/30/19    SLP Start Time  1533    SLP Stop Time   1615    SLP Time Calculation (min)  42 min    Activity Tolerance  Patient tolerated treatment well       Past Medical History:  Diagnosis Date  . Allergy   . Asthma   . Parkinson's disease (HCC)    Congenital R fourth nerve palsy    Past Surgical History:  Procedure Laterality Date  . CATARACT EXTRACTION, BILATERAL    . COLONOSCOPY    . SHOULDER SURGERY Left    lymph nodes  . TONSILLECTOMY    . VASECTOMY      There were no vitals filed for this visit.  Subjective Assessment - 11/07/18 1538    Subjective  "I'm seeing you for 6 weeks for the speaking, right?"    Patient is accompained by:  Family member   wife   Currently in Pain?  No/denies            ADULT SLP TREATMENT - 11/07/18 1539      General Information   Behavior/Cognition  Alert;Cooperative;Pleasant mood;Impulsive;Distractible      Treatment Provided   Treatment provided  Cognitive-Linquistic      Cognitive-Linquistic Treatment   Treatment focused on  Dysarthria    Skilled Treatment  SLP reviewed pt's everyday sentences due to pt writing 23 sentences of things he had said in the last 2-3 days (did not follow instructions). SLP and pt narrowed sentences down to 8, SLP went into waiting room and asked wife if sentences were daily spoken; she deleted one and added another. SLP reviewed with pt BID loud /a/ x7, BID completion of LOUD sentence reading. Pt produced /a/ with average low 90s dB, and read  sentences quickly, stumbling over three of them. SLP counted three seconds in between sentences for pt to read over the sentence prior to saying it loudly - pt had more success, average mid 80s dB. SLP moved louder specch into reading and pt read common sentences with SLP cues to place 3 seconds between sentences consistently. SLP moved pt productions of louder speech into spontaneous word responses and pt spoke one word in response to SLP word with average mid 70s dB. SLP reiterated to pt and to wife (in waiting room) that pt req'd to perform 7 loud /a/ BID and read the sentences BID.       Assessment / Recommendations / Plan   Plan  Continue with current plan of care      Progression Toward Goals   Progression toward goals  Progressing toward goals       SLP Education - 11/07/18 1623    Education provided  Yes    Education Details  everyday sentences BID after 7 loud /a/ BID    Person(s) Educated  Patient;Spouse    Methods  Explanation;Demonstration    Comprehension  Verbalized understanding;Need further instruction       SLP Short Term Goals - 11/07/18 1626  SLP SHORT TERM GOAL #1   Title  Pt will achieve average loud /a/ of average low 90sdB over 3 sessions    Time  4    Period  Weeks    Status  On-going      SLP SHORT TERM GOAL #2   Title  Pt will use abdominal breathing at rest 70% success over 2 sessions    Time  4    Period  Weeks    Status  On-going      SLP SHORT TERM GOAL #3   Title  Pt will use speech volume average low 70sdB when responding with sentences 18/20 over two sessions    Time  4    Period  Weeks    Status  On-going       SLP Long Term Goals - 11/07/18 1626      SLP LONG TERM GOAL #1   Title  Pt will generate loud /a/ with average of low 90s dB over 6 sessions    Time  8    Period  --   or 17 sessions   Status  On-going      SLP LONG TERM GOAL #2   Title  Pt will use abdominal breathing 50% of the time in 5 minutes simple conversation over  two sessions    Time  8    Period  Weeks   or 17 sessions   Status  On-going   for focus on LOUD speech     SLP LONG TERM GOAL #3   Title  Pt will use speech volume average mid-upper 60sdB in 5 minutes of simple conversation with rare min A over 2 sessions    Time  8    Period  Weeks   or 17 sessions   Status  On-going      SLP LONG TERM GOAL #4   Title  pt and/or wife will tell SLP 3 ways to maximize environmental surroundings in order to improve pt communicative successes over three sessions    Time  8    Period  Weeks    Status  On-going       Plan - 11/07/18 1623    Clinical Impression Statement  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 Cognitive ability was seen today as hindering pt progress in that pt wrote down 23 sentences ("I worte down some of the things I said") instead of 10 sentences he would say everyday.  He would benefit from a course of skilled ST focusing mainly on increasing loudness in conversation to improve pt's QOL, and in education for pt/wife how to manipulate the environment for incr'd communicative success.     Speech Therapy Frequency  2x / week    Duration  --   8 weeks or 17 visits (6 weeks/13 visits?)   Treatment/Interventions  Compensatory techniques;Functional tasks;SLP instruction and feedback;Environmental controls;Multimodal communcation approach;Internal/external aids;Patient/family education;Cognitive reorganization;Cueing hierarchy    Potential to Achieve Goals  Good    Potential Considerations  Ability to learn/carryover information;Severity of impairments       Patient will benefit from skilled therapeutic intervention in order to improve the following deficits and impairments:   Dysarthria and anarthria  Cognitive communication  deficit    Problem List Patient Active Problem List   Diagnosis Date Noted  . Parkinson's disease (  HCC) 05/04/2013    SCHINKE,CARL ,MS, CCC-SLP  11/07/2018, 4:26 PM  [facility] [address] [place], [place], [address] Phone: [phone]   Fax:  [phone]   Name: Zachary [name] MRN: [id number] Date of Birth: 24-Jan-1951

## 2018-11-12 ENCOUNTER — Ambulatory Visit: Payer: Medicare Other

## 2018-11-12 DIAGNOSIS — R41841 Cognitive communication deficit: Secondary | ICD-10-CM

## 2018-11-12 DIAGNOSIS — R471 Dysarthria and anarthria: Secondary | ICD-10-CM

## 2018-11-12 DIAGNOSIS — R2689 Other abnormalities of gait and mobility: Secondary | ICD-10-CM | POA: Diagnosis not present

## 2018-11-12 NOTE — Therapy (Signed)
[facility] [address] [place], [place], [address] Phone: [phone]   Fax:  [phone]  Speech Language Pathology Treatment  Patient Details  Name: Zachary [name] MRN: [id number] Date of Birth: 1951/09/16 Referring Provider (SLP): [name], DO   Encounter Date: 11/12/2018  End of Session - 11/12/18 1125    Visit Number  3    Number of Visits  17    Date for SLP Re-Evaluation  01/30/19    SLP Start Time  1020    SLP Stop Time   1100    SLP Time Calculation (min)  40 min    Activity Tolerance  Patient tolerated treatment well       Past Medical History:  Diagnosis Date  . Allergy   . Asthma   . Parkinson's disease (HCC)    Congenital R fourth nerve palsy    Past Surgical History:  Procedure Laterality Date  . CATARACT EXTRACTION, BILATERAL    . COLONOSCOPY    . SHOULDER SURGERY Left    lymph nodes  . TONSILLECTOMY    . VASECTOMY      There were no vitals filed for this visit.  Subjective Assessment - 11/12/18 1023    Subjective  "I'm kind of in an 'off' period now." Pt brought meds back with him - next PD med is at 1130.    Currently in Pain?  No/denies            ADULT SLP TREATMENT - 11/12/18 1025      General Information   Behavior/Cognition  Alert;Cooperative;Pleasant mood;Impulsive;Distractible      Treatment Provided   Treatment provided  Cognitive-Linquistic      Cognitive-Linquistic Treatment   Treatment focused on  Dysarthria    Skilled Treatment  (Cognition 30 minutes): Pt did not bring everyday sentences ("I lost them. In this foolishness with this lawsuit they got misplaced.") Pt problem solved what he could do in order to not lose his sentences. Pt stated he could have made a copy and given to his wife. SLP placed a manilla folder next to pt with "Therapy Papers" written on the front and pt placed the ST copy from last session in the folder spontaneously. SLP cued pt to  think about a binder and reminded pt that last tx course he lost his binder so SLP engaged with pt in how he could store his therapy papers and keep track of them. Pt req'd total A to generate keep them in a traveled area, in sight, with a "louder" color, as pt stated "put them in a file cabinet". SLP invited wife to enter the session with 7 minutes remaining to discuss how to keep therapy papers from getting lost and she and pt agreed to put them in a drawer in the room where pt goes to do exercises from therapy. (speech tx-individual)  SLP used loud /a/ to recalibrate speech in conversation to a louder overall level, pt average today was upper 80s with consistent mod cues for full breath and for loudness. Pt req'd mod A consistently with visual cues to read everyday sentences with at least 2-3 seconds in between them as pt originally said one after the other and talked on residual lung volume and stammered/stuttered with 75% of the sentences. When SLP asked pt what went wrong pt answered, "I said them too fast," then SLP used mod cus consistently to assist pt putting space/time between each one.  Assessment / Recommendations / Plan   Plan  Continue with current plan of care      Progression Toward Goals   Progression toward goals  Progressing toward goals       SLP Education - 11/12/18 1124    Education provided  Yes    Education Details  put space between each sentence, how to keep track of therapy papers    Person(s) Educated  Patient;Spouse    Methods  Explanation;Demonstration;Verbal cues;Handout    Comprehension  Verbal cues required;Returned demonstration;Verbalized understanding;Need further instruction       SLP Short Term Goals - 11/12/18 1127      SLP SHORT TERM GOAL #1   Title  Pt will achieve average loud /a/ of average low 90sdB over 3 sessions    Time  3    Period  Weeks    Status  On-going      SLP SHORT TERM GOAL #2   Title  Pt will use abdominal breathing at rest  70% success over 2 sessions    Time  3    Period  Weeks    Status  On-going      SLP SHORT TERM GOAL #3   Title  Pt will use speech volume average low 70sdB when responding with sentences 18/20 over two sessions    Time  3    Period  Weeks    Status  On-going       SLP Long Term Goals - 11/12/18 1127      SLP LONG TERM GOAL #1   Title  Pt will generate loud /a/ with average of low 90s dB over 6 sessions    Time  7    Period  --   or 17 sessions   Status  On-going      SLP LONG TERM GOAL #2   Title  Pt will use abdominal breathing 50% of the time in 5 minutes simple conversation over two sessions    Time  7    Period  Weeks   or 17 sessions   Status  On-going   for focus on LOUD speech     SLP LONG TERM GOAL #3   Title  Pt will use speech volume average mid-upper 60sdB in 5 minutes of simple conversation with rare min A over 2 sessions    Time  7    Period  Weeks   or 17 sessions   Status  On-going      SLP LONG TERM GOAL #4   Title  pt and/or wife will tell SLP 3 ways to maximize environmental surroundings in order to improve pt communicative successes over three sessions    Time  7    Period  Weeks    Status  On-going       Plan - 11/12/18 1125    Clinical Impression Statement  Pt 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 Cognitive ability was again seen today as pt lost his everyday sentences since last session. Most of session today was assisting pt reasoning and problem solving through how to keep better track of therapy papers. He would cont to benefit from a course of skilled ST focusing mainly on increasing loudness in conversation to improve pt's QOL, and in education for pt/wife how to manipulate the environment for incr'd communicative success.     Speech Therapy Frequency  2x / week    Duration  --    8 weeks or 17 visits (6 weeks/13 visits?)   Treatment/Interventions  Compensatory techniques;Functional tasks;SLP instruction and feedback;Environmental controls;Multimodal communcation approach;Internal/external aids;Patient/family education;Cognitive reorganization;Cueing hierarchy    Potential to Achieve Goals  Good    Potential Considerations  Ability to learn/carryover information;Severity of impairments       Patient will benefit from skilled therapeutic intervention in order to improve the following deficits and impairments:   Cognitive communication deficit  Dysarthria and anarthria    Problem List Patient Active Problem List   Diagnosis Date Noted  . Parkinson's disease (HCC) 05/04/2013    [facility] HospitalCHINKE,Avishai Reihl ,MS, CCC-SLP  11/12/2018, 11:28 AM  [facility] [address] [place], [place], [address] Phone: [phone]   Fax:  [phone]   Name: Zachary [name] MRN: [id number] Date of Birth: December 31, 1950

## 2018-11-13 ENCOUNTER — Ambulatory Visit: Payer: Medicare Other

## 2018-11-18 ENCOUNTER — Ambulatory Visit: Payer: Medicare Other

## 2018-11-18 DIAGNOSIS — R471 Dysarthria and anarthria: Secondary | ICD-10-CM

## 2018-11-18 DIAGNOSIS — R2689 Other abnormalities of gait and mobility: Secondary | ICD-10-CM | POA: Diagnosis not present

## 2018-11-18 DIAGNOSIS — R41841 Cognitive communication deficit: Secondary | ICD-10-CM

## 2018-11-18 NOTE — Therapy (Signed)
[facility] [address] [place], [place], [address] Phone: [phone]   Fax:  [phone]  Speech Language Pathology Treatment  Patient Details  Name: Zachary [name] MRN: [id number] Date of Birth: [birth year] Referring Provider (SLP): [name], DO   Encounter Date: 11/18/2018  End of Session - 11/18/18 1559    Visit Number  4    Number of Visits  17    Date for SLP Re-Evaluation  01/30/19    SLP Start Time  1403    SLP Stop Time   1445    SLP Time Calculation (min)  42 min       Past Medical History:  Diagnosis Date  . Allergy   . Asthma   . Parkinson's disease (HCC)    Congenital R fourth nerve palsy    Past Surgical History:  Procedure Laterality Date  . CATARACT EXTRACTION, BILATERAL    . COLONOSCOPY    . SHOULDER SURGERY Left    lymph nodes  . TONSILLECTOMY    . VASECTOMY      There were no vitals filed for this visit.  Subjective Assessment - 11/18/18 1403    Subjective  Pt walked back to the ST room with his cane in his hand, never touching the ground.     Currently in Pain?  No/denies            ADULT SLP TREATMENT - 11/18/18 0001      General Information   Behavior/Cognition  Alert;Cooperative;Pleasant mood;Impulsive;Distractible      Treatment Provided   Treatment provided  Cognitive-Linquistic      Cognitive-Linquistic Treatment   Treatment focused on  Dysarthria    Skilled Treatment  Pt did not bring everyday sentences today; "I brought my meds, and my cane but forgot my sentences." "I used a stopwatch trying to tell how long I vocalized on a sound." SLP asked pt if someone asked him to do this and pt stated he thought it would be a good idea "to have some tangible thing to do." Pt proceded to say /u/ with average mid 70s dB.  "I've done the 7 sentences twice, and the "ah" almost every day," pt states, "5 or 6 times three times a day." SLP attempted to ascertain why sentences  have been more infrequent than "ah" and pt stated he can do "ah" anywhere, but the sentecnes he has to have the paper for. SLP made pt two copies. With loud /a/ today pt produced with low-mid 80s dB with ususal mod cues for full breath. Pt and SLP engaged in simple conversation for intervals of 2 minutes - pt req'd consistent cues for louder speech, average mid-upper 60s dB (mid 60s dB without cues).      Assessment / Recommendations / Plan   Plan  Continue with current plan of care      Progression Toward Goals   Progression toward goals  Progressing toward goals         SLP Short Term Goals - 11/18/18 1601      SLP SHORT TERM GOAL #1   Title  Pt will achieve average loud /a/ of average low 90sdB over 3 sessions    Time  2    Period  Weeks    Status  On-going      SLP SHORT TERM GOAL #2   Title  Pt will use abdominal breathing at rest 70% success over 2 sessions    Time  2  Period  Weeks    Status  On-going      SLP SHORT TERM GOAL #3   Title  Pt will use speech volume average low 70sdB when responding with sentences 18/20 over two sessions    Time  2    Period  Weeks    Status  On-going       SLP Long Term Goals - 11/18/18 1602      SLP LONG TERM GOAL #1   Title  Pt will generate loud /a/ with average of low 90s dB over 6 sessions    Time  6    Period  --   or 17 sessions   Status  On-going      SLP LONG TERM GOAL #2   Title  Pt will use abdominal breathing 50% of the time in 5 minutes simple conversation over two sessions    Time  6    Period  Weeks   or 17 sessions   Status  On-going   for focus on LOUD speech     SLP LONG TERM GOAL #3   Title  Pt will use speech volume average mid-upper 60sdB in 5 minutes of simple conversation with rare min A over 2 sessions    Time  6    Period  Weeks   or 17 sessions   Status  On-going      SLP LONG TERM GOAL #4   Title  pt and/or wife will tell SLP 3 ways to maximize environmental surroundings in order to improve  pt communicative successes over three sessions    Time  6    Period  Weeks    Status  On-going       Plan - 11/18/18 1600    Clinical Impression Statement  Pt 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 Cognitive ability was again seen today as pt failed to remember to bring his everyday sentences provided to him in previous session. He would cont to benefit from a course of skilled ST focusing mainly on increasing loudness in conversation to improve pt's QOL, and in education for pt/wife how to manipulate the environment for incr'd communicative success. If pt follow through/continuity of exercises at home appear lacking it would be best to d/c pt at this time.    Speech Therapy Frequency  2x / week    Duration  --   8 weeks or 17 visits (6 weeks/13 visits?)   Treatment/Interventions  Compensatory techniques;Functional tasks;SLP instruction and feedback;Environmental controls;Multimodal communcation approach;Internal/external aids;Patient/family education;Cognitive reorganization;Cueing hierarchy    Potential to Achieve Goals  Good    Potential Considerations  Ability to learn/carryover information;Severity of impairments       Patient will benefit from skilled therapeutic intervention in order to improve the following deficits and impairments:   Dysarthria and anarthria  Cognitive communication deficit    Problem List Patient Active Problem List   Diagnosis Date Noted  . Parkinson's disease (HCC) 05/04/2013    [facility] ,MS, CCC-SLP  11/18/2018, 4:02 PM  [facility] [address] [place], [place], [address] Phone: [phone]   Fax:  [phone]   Name: Zachary [name] MRN: [id number] Date of Birth: [birth year]

## 2018-11-20 ENCOUNTER — Ambulatory Visit: Payer: Medicare Other

## 2018-11-21 ENCOUNTER — Ambulatory Visit: Payer: Medicare Other

## 2018-11-21 DIAGNOSIS — R41841 Cognitive communication deficit: Secondary | ICD-10-CM

## 2018-11-21 DIAGNOSIS — R471 Dysarthria and anarthria: Secondary | ICD-10-CM

## 2018-11-21 DIAGNOSIS — R2689 Other abnormalities of gait and mobility: Secondary | ICD-10-CM | POA: Diagnosis not present

## 2018-11-21 NOTE — Therapy (Signed)
[facility] [address] [place], [place], [address] Phone: [phone]   Fax:  [phone]  Speech Language Pathology Treatment  Patient Details  Name: Zachary [name] MRN: [id number] Date of Birth: 27-Jan-1951 Referring Provider (SLP): [name], DO   Encounter Date: 11/21/2018  End of Session - 11/21/18 0935    Visit Number  5    Number of Visits  17    Date for SLP Re-Evaluation  01/30/19    SLP Start Time  0853   pt went to restroom at 0850   SLP Stop Time   0931    SLP Time Calculation (min)  38 min    Activity Tolerance  Patient tolerated treatment well       Past Medical History:  Diagnosis Date  . Allergy   . Asthma   . Parkinson's disease (HCC)    Congenital R fourth nerve palsy    Past Surgical History:  Procedure Laterality Date  . CATARACT EXTRACTION, BILATERAL    . COLONOSCOPY    . SHOULDER SURGERY Left    lymph nodes  . TONSILLECTOMY    . VASECTOMY      There were no vitals filed for this visit.  Subjective Assessment - 11/21/18 0854    Subjective  Pt walked back at 0851 and used the restroom.    Currently in Pain?  No/denies            ADULT SLP TREATMENT - 11/21/18 0900      General Information   Behavior/Cognition  Alert;Cooperative;Pleasant mood;Impulsive;Distractible      Treatment Provided   Treatment provided  Cognitive-Linquistic      Cognitive-Linquistic Treatment   Treatment focused on  Dysarthria    Skilled Treatment  Pt brought everyday sentences, and his folder. "She reminded me, but I already had it in my hand."SLP used loud /a/ for pt to recalibrate loudness in conversation with average mid 80s dB. Everyday sentences with average mid to upper 70s dB. With sentence responses pt with average mid to puper 60s due to incr'd cognitive load, and with usual verbal and nonverbal cues for loudness. Conversation with pt between stimuli was mid 60s and pt speaking on  residual volume occasionally.       Assessment / Recommendations / Plan   Plan  Continue with current plan of care      Progression Toward Goals   Progression toward goals  Progressing toward goals         SLP Short Term Goals - 11/21/18 6962      SLP SHORT TERM GOAL #1   Title  Pt will achieve average loud /a/ of average low 90sdB over 3 sessions    Time  2    Period  Weeks    Status  On-going      SLP SHORT TERM GOAL #2   Title  Pt will use abdominal breathing at rest 70% success over 2 sessions    Time  2    Period  Weeks    Status  On-going      SLP SHORT TERM GOAL #3   Title  Pt will use speech volume average low 70sdB when responding with sentences 18/20 over two sessions    Time  2    Period  Weeks    Status  On-going       SLP Long Term Goals - 11/21/18 9528      SLP LONG TERM GOAL #1  Title  Pt will generate loud /a/ with average of low 90s dB over 6 sessions    Time  6    Period  --   or 17 sessions   Status  On-going      SLP LONG TERM GOAL #2   Title  Pt will use abdominal breathing 50% of the time in 5 minutes simple conversation over two sessions    Time  6    Period  Weeks   or 17 sessions   Status  On-going   for focus on LOUD speech     SLP LONG TERM GOAL #3   Title  Pt will use speech volume average mid-upper 60sdB in 5 minutes of simple conversation with rare min A over 2 sessions    Time  6    Period  Weeks   or 17 sessions   Status  On-going      SLP LONG TERM GOAL #4   Title  pt and/or wife will tell SLP 3 ways to maximize environmental surroundings in order to improve pt communicative successes over three sessions    Time  6    Period  Weeks    Status  On-going       Plan - 11/21/18 0935    Clinical Impression Statement  Pt 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  Cognitive ability was again seen today to hinder progress as pt had difficulty obtaining loudness > mid 60s dB in a simple similarity/difference task due to inabilty to multitask (loudness/content of message). Pt remembered to bring his everyday sentences provided to him. He would cont to benefit from a course of skilled ST focusing mainly on increasing loudness in conversation to improve pt's QOL, and in education for pt/wife how to manipulate the environment for incr'd communicative success. If pt follow through/continuity of exercises at home appear lacking it would be best to d/c pt at this time.    Speech Therapy Frequency  2x / week    Duration  --   8 weeks or 17 visits (6 weeks/13 visits?)   Treatment/Interventions  Compensatory techniques;Functional tasks;SLP instruction and feedback;Environmental controls;Multimodal communcation approach;Internal/external aids;Patient/family education;Cognitive reorganization;Cueing hierarchy    Potential to Achieve Goals  Good    Potential Considerations  Ability to learn/carryover information;Severity of impairments       Patient will benefit from skilled therapeutic intervention in order to improve the following deficits and impairments:   Dysarthria and anarthria  Cognitive communication deficit    Problem List Patient Active Problem List   Diagnosis Date Noted  . Parkinson's disease (HCC) 05/04/2013    [facility] ,MS, CCC-SLP  11/21/2018, 9:39 AM  [facility] [address] [place], [place], [address] Phone: [phone]   Fax:  [phone]   Name: Zachary [name] MRN: [id number] Date of Birth: [birth year]

## 2018-11-21 NOTE — Patient Instructions (Signed)
  Please complete the assigned speech therapy homework prior to your next session and return it to the speech therapist at your next visit.  

## 2018-11-24 ENCOUNTER — Ambulatory Visit: Payer: Medicare Other

## 2018-11-24 DIAGNOSIS — R41841 Cognitive communication deficit: Secondary | ICD-10-CM

## 2018-11-24 DIAGNOSIS — R471 Dysarthria and anarthria: Secondary | ICD-10-CM

## 2018-11-24 DIAGNOSIS — R2689 Other abnormalities of gait and mobility: Secondary | ICD-10-CM | POA: Diagnosis not present

## 2018-11-24 NOTE — Patient Instructions (Signed)
  Please complete the assigned speech therapy homework prior to your next session and return it to the speech therapist at your next visit.  

## 2018-11-24 NOTE — Therapy (Signed)
[facility] [address] [place], [place], [address] Phone: [phone]   Fax:  [phone]  Speech Language Pathology Treatment  Patient Details  Name: Zachary [name] MRN: [id number] Date of Birth: 04/18/1951 Referring Provider (SLP): [name], DO   Encounter Date: 11/24/2018  End of Session - 11/24/18 1638    Visit Number  6    Number of Visits  17    Date for SLP Re-Evaluation  01/30/19    SLP Start Time  1533    SLP Stop Time   1615    SLP Time Calculation (min)  42 min    Activity Tolerance  Patient tolerated treatment well       Past Medical History:  Diagnosis Date  . Allergy   . Asthma   . Parkinson's disease (HCC)    Congenital R fourth nerve palsy    Past Surgical History:  Procedure Laterality Date  . CATARACT EXTRACTION, BILATERAL    . COLONOSCOPY    . SHOULDER SURGERY Left    lymph nodes  . TONSILLECTOMY    . VASECTOMY      There were no vitals filed for this visit.  Subjective Assessment - 11/24/18 1539    Subjective  "Just woke up from a nap." Pt did not use cane walking to ST room "I use it just in case I need it."    Currently in Pain?  No/denies            ADULT SLP TREATMENT - 11/24/18 1540      General Information   Behavior/Cognition  Alert;Cooperative;Pleasant mood;Impulsive;Distractible      Treatment Provided   Treatment provided  Cognitive-Linquistic      Cognitive-Linquistic Treatment   Treatment focused on  Dysarthria    Skilled Treatment  Last PD med 1400. Pt reports "(loud /a/) was 'ok' over the weekend", sentences were "loud". Pt brought his folder today. Initial conversation averaged mid 60s dB for 8 minutes. SLP used loud /a/ for reclibration of conversational speech with average today of low 90s dB with consistent visual and verbal cues for loudness. Phrase and short sentence responses were average upper 60s (picture description). Answers to SLP questions  of short paragraph were mid 60s dB average. SLP asked pt why these were more difficult - due to incr'd cognitive load. SLP educated pt briefly on how to decr/compensate for mutitasking into two single tasks.       Assessment / Recommendations / Plan   Plan  Continue with current plan of care      Progression Toward Goals   Progression toward goals  Progressing toward goals       SLP Education - 11/24/18 1638    Education provided  Yes    Education Details  compensations for multitasking    Person(s) Educated  Patient    Methods  Explanation    Comprehension  Verbalized understanding;Need further instruction       SLP Short Term Goals - 11/24/18 1649      SLP SHORT TERM GOAL #1   Title  Pt will achieve average loud /a/ of average low 90sdB with usual min A over 3 sessions    Time  1    Period  Weeks    Status  Revised   from independent     SLP SHORT TERM GOAL #2   Title  Pt will use abdominal breathing at rest 50% success over 2 sessions    Time  1    Period  Weeks    Status  Revised   from 70%     SLP SHORT TERM GOAL #3   Title  Pt will use speech volume average low 70sdB when responding with sentences 16/20 over two sessions    Time  2    Period  Weeks    Status  Revised   from 18/20      SLP Long Term Goals - 11/24/18 1650      SLP LONG TERM GOAL #1   Title  Pt will generate loud /a/ with average of low 90s dB over 6 sessions    Time  5    Period  --   or 17 sessions   Status  On-going      SLP LONG TERM GOAL #2   Title  Pt will use abdominal breathing 50% of the time in 5 minutes simple conversation over two sessions    Time  5    Period  Weeks   or 17 sessions   Status  On-going   for focus on LOUD speech     SLP LONG TERM GOAL #3   Title  Pt will use speech volume average mid-upper 60sdB in 5 minutes of simple conversation with rare min A over 2 sessions    Time  5    Period  Weeks   or 17 sessions   Status  On-going      SLP LONG TERM GOAL #4    Title  pt and/or wife will tell SLP 3 ways to maximize environmental surroundings in order to improve pt communicative successes over three sessions    Time  5    Period  Weeks    Status  On-going       Plan - 11/24/18 1646    Clinical Impression Statement  Pt 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 Decr'd cognitive ability reported to hinder progress as pt had difficulty obtaining loudness > mid 60s dB for simple question answering task. He would cont to benefit from a course of skilled ST focusing mainly on increasing loudness in conversation to improve pt's QOL, and in education for pt/wife how to manipulate the environment for incr'd communicative success. If pt follow-through/continuity of exercises at home appear lacking it would be best to d/c pt at this time.    Speech Therapy Frequency  2x / week    Duration  --   8 weeks or 17 visits (6 weeks/13 visits?)   Treatment/Interventions  Compensatory techniques;Functional tasks;SLP instruction and feedback;Environmental controls;Multimodal communcation approach;Internal/external aids;Patient/family education;Cognitive reorganization;Cueing hierarchy    Potential to Achieve Goals  Good    Potential Considerations  Ability to learn/carryover information;Severity of impairments       Patient will benefit from skilled therapeutic intervention in order to improve the following deficits and impairments:   Dysarthria and anarthria  Cognitive communication deficit    Problem List Patient Active Problem List   Diagnosis Date Noted  . Parkinson's disease (HCC) 05/04/2013    [facility]Zachary [name] ,MS, CCC-SLP  11/24/2018, 4:51 PM  [facility] [address] [place], [place], [address] Phone: [phone]   Fax:   [phone]   Name: Zachary [name] MRN: [id number] Date of Birth: 1951-01-16

## 2018-11-26 ENCOUNTER — Ambulatory Visit: Payer: Medicare Other

## 2018-11-26 DIAGNOSIS — R2689 Other abnormalities of gait and mobility: Secondary | ICD-10-CM | POA: Diagnosis not present

## 2018-11-26 DIAGNOSIS — R471 Dysarthria and anarthria: Secondary | ICD-10-CM

## 2018-11-26 DIAGNOSIS — R41841 Cognitive communication deficit: Secondary | ICD-10-CM

## 2018-11-26 NOTE — Therapy (Signed)
[facility] [address] [place], [place], [address] Phone: [phone]   Fax:  [phone]  Speech Language Pathology Treatment  Patient Details  Name: Zachary [name] MRN: [id number] Date of Birth: 09-25-1951 Referring Provider (SLP): Alonza Bogus, DO   Encounter Date: 11/26/2018  End of Session - 11/26/18 1719    Visit Number  7    Number of Visits  17    Date for SLP Re-Evaluation  01/30/19    SLP Start Time  1533    SLP Stop Time   1615    SLP Time Calculation (min)  42 min    Activity Tolerance  Patient tolerated treatment well       Past Medical History:  Diagnosis Date  . Allergy   . Asthma   . Parkinson's disease ([place])    Congenital R fourth nerve palsy    Past Surgical History:  Procedure Laterality Date  . CATARACT EXTRACTION, BILATERAL    . COLONOSCOPY    . SHOULDER SURGERY Left    lymph nodes  . TONSILLECTOMY    . VASECTOMY      There were no vitals filed for this visit.  Subjective Assessment - 11/26/18 1544    Subjective  "More fair than good, I think." (re: frequency with /a/)    Currently in Pain?  Yes    Pain Score  2     Pain Location  Wrist    Pain Orientation  Right    Pain Descriptors / Indicators  Sore    Pain Type  Acute pain    Pain Onset  In the past 7 days    Pain Frequency  Intermittent            ADULT SLP TREATMENT - 11/26/18 1546      General Information   Behavior/Cognition  Alert;Cooperative;Pleasant mood;Impulsive;Distractible      Treatment Provided   Treatment provided  Cognitive-Linquistic      Cognitive-Linquistic Treatment   Treatment focused on  Dysarthria    Skilled Treatment  Last PD med 1400. Pt reports "(loud /a/) was completed with "fair" frequency, today average low 90s, with consistency mod A. Everyday sentences spoken/read with lower 80s dB. Pt brought his folder again today. Initial conversation averaged low-mid 60s dB for 6 minutes.  Phrase and short sentence responses today were average upper 60s to low 70s dB (upper 60s dB for more situations with migher cognitive load). No carryover into conversation.       Assessment / Recommendations / Plan   Plan  Continue with current plan of care      Progression Toward Goals   Progression toward goals  Progressing toward goals         SLP Short Term Goals - 11/26/18 1721      SLP SHORT TERM GOAL #1   Title  Pt will achieve average loud /a/ of average low 90sdB with usual min A over 3 sessions    Status  Partially Met   from independent -  with occasional min-mod A     SLP SHORT TERM GOAL #2   Title  Pt will use abdominal breathing at rest 50% success over 2 sessions    Status  Deferred   from 70%   --focus on loudness     SLP SHORT TERM GOAL #3   Title  Pt will use speech volume average low 70sdB when responding with sentences 16/20 over two sessions    Baseline  11-26-18    Time  --    Period  --    Status  Partially Met   from 18/20      SLP Long Term Goals - 11/26/18 1723      SLP LONG TERM GOAL #1   Title  Pt will generate loud /a/ with average of low 90s dB over 6 sessions    Baseline  11-26-18    Time  5    Period  --   or 17 sessions   Status  On-going      SLP LONG TERM GOAL #2   Title  Pt will use abdominal breathing 50% of the time in 5 minutes simple conversation with nonverbal cues    Time  5    Period  Weeks   or 17 sessions   Status  Revised   from "over two sessions"     SLP LONG TERM GOAL #3   Title  Pt will use speech volume average mid-upper 60sdB in 5 minutes of simple conversation with occasional min A over 2 sessions    Time  5    Period  Weeks   or 17 sessions   Status  Revised   from "rare min A"     SLP LONG TERM GOAL #4   Title  pt and/or wife will tell SLP 3 ways to maximize environmental surroundings in order to improve pt communicative successes over three sessions    Time  5    Period  Weeks    Status  On-going        Plan - 11/26/18 1719    Clinical Impression Statement  Pt participation in home tasks is less-frequent than SLP has recommended. SLP told pt to do his best to adhere to daily BID completion of /a/ and of everyday sentences. 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 Incr'd cognitive load for phrase and short sentences led to pt's difficulty obtaining loudness > upper 60s dB. He would cont to benefit from a course of skilled ST focusing mainly on increasing loudness in conversation to improve pt's QOL, and in education for pt/wife how to manipulate the environment for incr'd communicative success. If pt follow-through/continuity of exercises at home appear lacking it would be best to d/c pt at this time.    Speech Therapy Frequency  2x / week    Duration  --   8 weeks or 17 visits (6 weeks/13 visits?)   Treatment/Interventions  Compensatory techniques;Functional tasks;SLP instruction and feedback;Environmental controls;Multimodal communcation approach;Internal/external aids;Patient/family education;Cognitive reorganization;Cueing hierarchy    Potential to Achieve Goals  Good    Potential Considerations  Ability to learn/carryover information;Severity of impairments       Patient will benefit from skilled therapeutic intervention in order to improve the following deficits and impairments:   Dysarthria and anarthria  Cognitive communication deficit    Problem List Patient Active Problem List   Diagnosis Date Noted  . Parkinson's disease ([place]) 05/04/2013    [facility] ,[place], [place]  11/26/2018, 5:25 PM  [facility] [address] [place] [address] [place], [place], [address] Phone: [phone]   Fax:  [phone]   Name: Zachary [name] MRN: [id number] Date of Birth: 1951-08-17

## 2018-11-26 NOTE — Patient Instructions (Signed)
  Please complete the assigned speech therapy homework prior to your next session and return it to the speech therapist at your next visit.  

## 2018-11-28 ENCOUNTER — Other Ambulatory Visit: Payer: Self-pay | Admitting: Neurology

## 2018-11-28 DIAGNOSIS — G20 Parkinson's disease: Secondary | ICD-10-CM

## 2018-12-02 ENCOUNTER — Ambulatory Visit: Payer: Medicare Other | Attending: Neurology

## 2018-12-02 DIAGNOSIS — R29818 Other symptoms and signs involving the nervous system: Secondary | ICD-10-CM | POA: Diagnosis present

## 2018-12-02 DIAGNOSIS — R2689 Other abnormalities of gait and mobility: Secondary | ICD-10-CM | POA: Diagnosis present

## 2018-12-02 DIAGNOSIS — R41844 Frontal lobe and executive function deficit: Secondary | ICD-10-CM | POA: Diagnosis present

## 2018-12-02 DIAGNOSIS — R29898 Other symptoms and signs involving the musculoskeletal system: Secondary | ICD-10-CM | POA: Diagnosis present

## 2018-12-02 DIAGNOSIS — R278 Other lack of coordination: Secondary | ICD-10-CM | POA: Diagnosis present

## 2018-12-02 DIAGNOSIS — R2681 Unsteadiness on feet: Secondary | ICD-10-CM | POA: Insufficient documentation

## 2018-12-02 DIAGNOSIS — R41842 Visuospatial deficit: Secondary | ICD-10-CM | POA: Insufficient documentation

## 2018-12-02 DIAGNOSIS — R293 Abnormal posture: Secondary | ICD-10-CM | POA: Insufficient documentation

## 2018-12-02 DIAGNOSIS — R41841 Cognitive communication deficit: Secondary | ICD-10-CM | POA: Diagnosis present

## 2018-12-02 DIAGNOSIS — R471 Dysarthria and anarthria: Secondary | ICD-10-CM | POA: Diagnosis not present

## 2018-12-02 NOTE — Therapy (Signed)
[facility] [address] [place], [place], [address] Phone: [phone]   Fax:  [phone]  Speech Language Pathology Treatment  Patient Details  Name: Zachary [name] MRN: [id number] Date of Birth: [birth year] Referring Provider (SLP): Alonza Bogus, DO   Encounter Date: 12/02/2018  End of Session - 12/02/18 1030    Visit Number  8    Number of Visits  17    Date for SLP Re-Evaluation  01/30/19    SLP Start Time  0935    SLP Stop Time   1015    SLP Time Calculation (min)  40 min    Activity Tolerance  Patient tolerated treatment well       Past Medical History:  Diagnosis Date  . Allergy   . Asthma   . Parkinson's disease ([place])    Congenital R fourth nerve palsy    Past Surgical History:  Procedure Laterality Date  . CATARACT EXTRACTION, BILATERAL    . COLONOSCOPY    . SHOULDER SURGERY Left    lymph nodes  . TONSILLECTOMY    . VASECTOMY      There were no vitals filed for this visit.  Subjective Assessment - 12/02/18 0935    Subjective  "Well your [place] buddies didn't come through." (speaking on residual air) "I missed the chair this morning and sat on the floor."    Currently in Pain?  Yes    Pain Score  2     Pain Location  Foot    Pain Orientation  Left    Pain Descriptors / Indicators  Discomfort;Nagging    Pain Type  Acute pain    Pain Onset  1 to 4 weeks ago    Pain Frequency  Intermittent            ADULT SLP TREATMENT - 12/02/18 0941      General Information   Behavior/Cognition  Alert;Pleasant mood;Cooperative;Distractible;Impulsive      Treatment Provided   Treatment provided  Cognitive-Linquistic      Cognitive-Linquistic Treatment   Treatment focused on  Dysarthria    Skilled Treatment  In conversation upon entry to [facility], pt frequently speaking on residual air, suboptimal volume of mid 60s dB. After SLP cue to take a breath when necessary, pt did so approx 25% of the  time. Pt admits noncompliance with loud /a/ and with sentences - "I can't admit to (twice a day every day)." Pt states he has tracked length a few times "I got to [address] once." SLP told pt that if he is tracking loudness he is likely not focusing on loudness, which is the reason for loud /a/. SLP told pt to focus on loudness ONLY. Pt's loud /a/ today was measured at low to mid 90s dB, necessary to foster WNL speech volume in conversation. Everyday sentences were completed with average low 80s dB, to foster carryover of louder, WNL volume in conversation. SLP used one-two sentence response to habitualize louder speech - pt req'd consistent min verbal and nonverbal cues which were successful in having pt incr his volume to low 70s dB. Picture description task with 1-2 sentences with usual cues for loudness, after 4 items pt switched the task to phrase responses which were just as effective and were more consistently loud - average increased to low 70s dB with min nonverbal cues needed      Assessment / Recommendations / Plan   Plan  Continue with current plan of care  Progression Toward Goals   Progression toward goals  Progressing toward goals   better consistency with loud /a/        SLP Short Term Goals - 11/26/18 1721      SLP SHORT TERM GOAL #1   Title  Pt will achieve average loud /a/ of average low 90sdB with usual min A over 3 sessions    Status  Partially Met   from independent -  with occasional min-mod A     SLP SHORT TERM GOAL #2   Title  Pt will use abdominal breathing at rest 50% success over 2 sessions    Status  Deferred   from 70%   --focus on loudness     SLP SHORT TERM GOAL #3   Title  Pt will use speech volume average low 70sdB when responding with sentences 16/20 over two sessions    Baseline  11-26-18    Time  --    Period  --    Status  Partially Met   from 18/20      SLP Long Term Goals - 12/02/18 1033      SLP LONG TERM GOAL #1   Title  Pt will  generate loud /a/ with average of low 90s dB over 6 sessions    Baseline  11-26-18, 12-02-18    Time  4    Period  Weeks   or 17 sessions   Status  On-going      SLP LONG TERM GOAL #2   Title  Pt will use abdominal breathing 50% of the time in 5 minutes simple conversation with nonverbal cues    Time  4    Period  Weeks   or 17 sessions   Status  Revised   from "over two sessions"     SLP LONG TERM GOAL #3   Title  Pt will use speech volume average mid-upper 60sdB in 5 minutes of simple conversation with occasional min A over 2 sessions    Time  4    Period  Weeks   or 17 sessions   Status  Revised   from "rare min A"     SLP LONG TERM GOAL #4   Title  pt and/or wife will tell SLP 3 ways to maximize environmental surroundings in order to improve pt communicative successes over three sessions    Time  5    Period  Weeks    Status  On-going       Plan - 12/02/18 1030    Clinical Impression Statement  Pt participation in home tasks cont to be less-frequent than SLP has recommended, however pt's loud /a/ was louder average today than last week/s. SLP again reminded pt to do his best to adhere to daily BID completion of /a/ and of everyday sentences. 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 Incr'd cognitive load for phrase and short sentences led to pt's difficulty obtaining loudness > upper 60s dB. However today when this occurred pt switched to answering in shorter phrases which decr'd cognitive load/need to process an incr'd amount of spoken language and his loudness was improved. He would cont to benefit from a course of skilled ST focusing mainly on increasing loudness in conversation to improve pt's QOL, and in education for pt/wife how to manipulate the environment for incr'd communicative success. If pt  follow-through/continuity of exercises  at home appear lacking it would be best to d/c pt at this time.    Speech Therapy Frequency  2x / week    Duration  --   8 weeks or 17 visits (6 weeks/13 visits?)   Treatment/Interventions  Compensatory techniques;Functional tasks;SLP instruction and feedback;Environmental controls;Multimodal communcation approach;Internal/external aids;Patient/family education;Cognitive reorganization;Cueing hierarchy    Potential to Achieve Goals  Good    Potential Considerations  Ability to learn/carryover information;Severity of impairments       Patient will benefit from skilled therapeutic intervention in order to improve the following deficits and impairments:   Dysarthria and anarthria  Cognitive communication deficit    Problem List Patient Active Problem List   Diagnosis Date Noted  . Parkinson's disease ([place]) 05/04/2013    [facility] ,[place], [place]  12/02/2018, 10:34 AM  [facility] [address] [place], [place], [address] Phone: [phone]   Fax:  [phone]   Name: Zachary [name] MRN: [id number] Date of Birth: May 28, 1951

## 2018-12-04 ENCOUNTER — Ambulatory Visit: Payer: Medicare Other

## 2018-12-04 DIAGNOSIS — R471 Dysarthria and anarthria: Secondary | ICD-10-CM

## 2018-12-04 DIAGNOSIS — R41841 Cognitive communication deficit: Secondary | ICD-10-CM

## 2018-12-04 NOTE — Therapy (Signed)
[facility] [address] [place], [place], [address] Phone: [phone]   Fax:  [phone]  Speech Language Pathology Treatment  Patient Details  Name: Zachary [name] MRN: [id number] Date of Birth: 07/26/1951 Referring Provider (SLP): Alonza Bogus, DO   Encounter Date: 12/04/2018  End of Session - 12/04/18 1058    Visit Number  9    Number of Visits  17    Date for SLP Re-Evaluation  01/30/19    SLP Start Time  0934    SLP Stop Time   1017    SLP Time Calculation (min)  43 min    Activity Tolerance  Patient tolerated treatment well       Past Medical History:  Diagnosis Date  . Allergy   . Asthma   . Parkinson's disease ([facility])    Congenital R fourth nerve palsy    Past Surgical History:  Procedure Laterality Date  . CATARACT EXTRACTION, BILATERAL    . COLONOSCOPY    . SHOULDER SURGERY Left    lymph nodes  . TONSILLECTOMY    . VASECTOMY      There were no vitals filed for this visit.  Subjective Assessment - 12/04/18 0942    Subjective  Wife attends therapy with pt today. "Zachary's doing (the loud /a/ and the sentences) on his own."    Patient is accompained by:  Family member   Zachary [name] - wife   Currently in Pain?  No/denies            ADULT SLP TREATMENT - 12/04/18 0944      General Information   Behavior/Cognition  Alert;Pleasant mood;Cooperative;Distractible;Impulsive      Treatment Provided   Treatment provided  Cognitive-Linquistic      Cognitive-Linquistic Treatment   Treatment focused on  Dysarthria    Skilled Treatment  In conversation upon entry to [facility], pt occasionally speaking on residual air, suboptimal volume of mid 60s dB, pt self-corrected volume x3, residual air x2. Loud /a/ completed with low 90sdB average, SLP cue necessary for full breath faded to independent. Pt again admits noncompliance with loud /a/ and with sentences - SLP told pt Zachary owuld like to see pt "take the next step"  and complete BID. Everyday sentences were completed with average low-mid 80s dB, to foster carryover of louder, WNL volume in conversation. Given wife attended with pt, SLP used a triad conversation to assess efficacy of wife's cues to pt as well as to mimick conversation outside [facility]. Pt with mid to upper 60s dB average over 10 minutes sipmle to mod complex conversation, and wife cueing was both appropriate and effective.       Assessment / Recommendations / Plan   Plan  Continue with current plan of care      Progression Toward Goals   Progression toward goals  Progressing toward goals   d/c likely next 1-2 visits      SLP Education - 12/04/18 1058    Education provided  Yes    Education Details  wife will need to cue pt for loudness, present cueing techniques are effective for pt    Person(s) Educated  Patient;Spouse    Methods  Explanation    Comprehension  Verbalized understanding       SLP Short Term Goals - 11/26/18 1721      SLP SHORT TERM GOAL #1   Title  Pt will achieve average loud /a/ of average low 90sdB with usual min A over  3 sessions    Status  Partially Met   from independent -  with occasional min-mod A     SLP SHORT TERM GOAL #2   Title  Pt will use abdominal breathing at rest 50% success over 2 sessions    Status  Deferred   from 70%   --focus on loudness     SLP SHORT TERM GOAL #3   Title  Pt will use speech volume average low 70sdB when responding with sentences 16/20 over two sessions    Baseline  11-26-18    Time  --    Period  --    Status  Partially Met   from 18/20      SLP Long Term Goals - 12/04/18 1101      SLP LONG TERM GOAL #1   Title  Pt will generate loud /a/ with average of low 90s dB over 6 sessions    Baseline  11-26-18, 12-02-18, 12-04-18    Time  4    Period  Weeks   or 17 sessions   Status  On-going      SLP LONG TERM GOAL #2   Title  Pt will use abdominal breathing 50% of the time in 5 minutes simple conversation with nonverbal  cues    Time  4    Period  Weeks   or 17 sessions   Status  Deferred   for work on loudness     SLP LONG TERM GOAL #3   Title  Pt will use speech volume average mid-upper 60sdB in 5 minutes of simple conversation with occasional min A over 2 sessions    Baseline  12-04-18    Time  4    Period  Weeks   or 17 sessions   Status  Revised   from "rare min A"     SLP LONG TERM GOAL #4   Title  pt and/or wife will tell SLP 3 ways to maximize environmental surroundings in order to improve pt communicative successes over three sessions    Baseline  12-04-18    Time  4    Period  Weeks    Status  On-going       Plan - 12/04/18 1058    Clinical Impression Statement  Pt participation in home tasks cont to be less-frequent than SLP has recommended, however pt's loud /a/ continues louder average today than last week/s. SLP again reminded pt to do his best to adhere to daily BID completion of /a/ and of everyday sentences. 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 Wife's cues were appropriate and effecitve for loudness.  Zachary would cont to benefit from a course of skilled ST for 1-2 sessions, focusing mainly on increasing loudness in conversation to improve pt's QOL, and in education for pt/wife how to manipulate the environment for incr'd communicative success. If pt follow-through/continuity of exercises at home appear lacking it would be best to d/c pt at this time.    Speech Therapy Frequency  2x / week    Duration  --   8 weeks or 17 sessions (6 weeks/13 sessions?)   Treatment/Interventions  Compensatory techniques;Functional tasks;SLP instruction and feedback;Environmental controls;Multimodal communcation approach;Internal/external aids;Patient/family  education;Cognitive reorganization;Cueing hierarchy    Potential to Achieve Goals  Good  Potential Considerations  Ability to learn/carryover information;Severity of impairments    Consulted and Agree with Plan of Care  Patient;Family member/caregiver    Family Member Consulted  wife       Patient will benefit from skilled therapeutic intervention in order to improve the following deficits and impairments:   Dysarthria and anarthria  Cognitive communication deficit    Problem List Patient Active Problem List   Diagnosis Date Noted  . Parkinson's disease ([place]) 05/04/2013    [facility] ,[place], [place]  12/04/2018, 11:02 AM  [facility] [address] [place], [place], [address] Phone: [phone]   Fax:  [phone]   Name: MADISON ALBEA MRN: [id number] Date of Birth: [birth year]

## 2018-12-10 ENCOUNTER — Ambulatory Visit: Payer: Medicare Other

## 2018-12-10 DIAGNOSIS — R41841 Cognitive communication deficit: Secondary | ICD-10-CM

## 2018-12-10 DIAGNOSIS — R471 Dysarthria and anarthria: Secondary | ICD-10-CM

## 2018-12-10 NOTE — Therapy (Signed)
[facility] [address] [place], [place], [address] Phone: [phone]   Fax:  [phone]  Speech Language Pathology Treatment  Patient Details  Name: Zachary [name] MRN: [id number] Date of Birth: [birth year] Referring Provider (SLP): Alonza Bogus, DO   Encounter Date: 12/10/2018  End of Session - 12/10/18 1323    Visit Number  10    Number of Visits  17    Date for SLP Re-Evaluation  01/30/19    SLP Start Time  5    SLP Stop Time   1016    SLP Time Calculation (min)  41 min    Activity Tolerance  Patient tolerated treatment well       Past Medical History:  Diagnosis Date  . Allergy   . Asthma   . Parkinson's disease ([place])    Congenital R fourth nerve palsy    Past Surgical History:  Procedure Laterality Date  . CATARACT EXTRACTION, BILATERAL    . COLONOSCOPY    . SHOULDER SURGERY Left    lymph nodes  . TONSILLECTOMY    . VASECTOMY      There were no vitals filed for this visit.  Subjective Assessment - 12/10/18 0902    Subjective  Pt enetered talking in sub-WNL volume.     Patient is accompained by:  Family member   wife   Currently in Pain?  No/denies            ADULT SLP TREATMENT - 12/10/18 0904      General Information   Behavior/Cognition  Alert;Pleasant mood;Cooperative;Distractible;Impulsive      Treatment Provided   Treatment provided  Cognitive-Linquistic      Cognitive-Linquistic Treatment   Treatment focused on  Dysarthria    Skilled Treatment  Pt consistency with /a/ has been at inconsistent times but has been consistent from day to day. (wife is surprised at this). SLP used loud /a/ to recalibrate pt's conversational speech - average today was low 90s dB. Pt and wife agree pt has been consistent with these at home. SLP asked pt what he could do to consistently get everydaysentences in, and pt continually told SLP vague or non-practical answers, which continued after SLP  rephrasing the question, twice.  Wife then assisted pt appropriately to think about options for him to be more consistent - they will discuss and tell SLP tomorrow. 9 minutes of conversation today with wife was at mid to upper 60s dB.  Pt and wife agree pt is ready for d/c from [facility] tomorrow.       Assessment / Recommendations / Plan   Plan  Continue with current plan of care   d/c likely tomorrow     Progression Toward Goals   Progression toward goals  Progressing toward goals       SLP Education - 12/10/18 1323    Education provided  Yes    Education Details  incr consistency with everyday sentences, loudness necessary with everyday sentences    Person(s) Educated  Spouse;Patient    Methods  Explanation;Demonstration    Comprehension  Verbalized understanding;Returned demonstration;Verbal cues required       SLP Short Term Goals - 11/26/18 1721      SLP SHORT TERM GOAL #1   Title  Pt will achieve average loud /a/ of average low 90sdB with usual min A over 3 sessions    Status  Partially Met   from independent -  with occasional min-mod A  SLP SHORT TERM GOAL #2   Title  Pt will use abdominal breathing at rest 50% success over 2 sessions    Status  Deferred   from 70%   --focus on loudness     SLP SHORT TERM GOAL #3   Title  Pt will use speech volume average low 70sdB when responding with sentences 16/20 over two sessions    Baseline  11-26-18    Time  --    Period  --    Status  Partially Met   from 18/20      SLP Long Term Goals - 12/10/18 1326      SLP LONG TERM GOAL #1   Title  Pt will generate loud /a/ with average of low 90s dB over 6 sessions    Baseline  11-26-18, 12-02-18, 12-04-18, 12-10-18    Time  3    Period  Weeks   or 17 sessions   Status  On-going      SLP LONG TERM GOAL #2   Title  Pt will use abdominal breathing 50% of the time in 5 minutes simple conversation with nonverbal cues    Period  --   or 17 sessions   Status  Deferred   for work on  loudness     SLP LONG TERM GOAL #3   Title  Pt will use speech volume average mid-upper 60sdB in 5 minutes of simple conversation with occasional min A over 2 sessions    Baseline  12-04-18    Time  3    Period  Weeks   or 17 sessions   Status  Revised   from "rare min A"     SLP LONG TERM GOAL #4   Title  pt and/or wife will tell SLP 3 ways to maximize environmental surroundings in order to improve pt communicative successes over three sessions    Baseline  12-04-18    Time  3    Period  Weeks    Status  On-going       Plan - 12/10/18 1324    Clinical Impression Statement  Pt presents today with 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 Decr'd cognitive ability affects overall progress as pt had difficulty obtaining loudness > mid 60s dB for conversation involving incr'd cognitive-linguistics. See skilled intervention for more details. One more session in current course of skilled ST focusing mainly on increasing loudness in conversation to improve pt's QOL, and in education for pt/wife how to manipulate the environment for incr'd communicative success. Pt/wife agree with d/c tomorrow.    Speech Therapy Frequency  2x / week    Duration  --   8 weeks or 17 visits (6 weeks/13 visits?)   Treatment/Interventions  Compensatory techniques;Functional tasks;SLP instruction and feedback;Environmental controls;Multimodal communcation approach;Internal/external aids;Patient/family education;Cognitive reorganization;Cueing hierarchy    Potential to Achieve Goals  Good    Potential Considerations  Ability to learn/carryover information;Severity of impairments       Patient will benefit from skilled therapeutic intervention in order to improve the following deficits and impairments:   Dysarthria and anarthria  Cognitive communication deficit   Speech  Therapy Progress Note  Dates of Reporting Period: 11-04-18 to present  Subjective: Pt has been seen for 10 visits to improve conversational volume  Objective Measurements: Pt has improved conversational volume in simple conversation to WFL/WNL.  Goal Update: See above.  Plan: See  above  -pt to d/c next session.  Reason Skilled Services are Required: HAbitualize louder conversational speech.    Problem List Patient Active Problem List   Diagnosis Date Noted  . Parkinson's disease ([place]) 05/04/2013    [facility] 12/10/2018, 1:28 PM  [facility] [address] [place], [place], [address] Phone: [phone]   Fax:  [phone]   Name: Zachary [name] MRN: [id number] Date of Birth: [birth year]

## 2018-12-11 ENCOUNTER — Ambulatory Visit: Payer: Medicare Other

## 2018-12-11 DIAGNOSIS — R41841 Cognitive communication deficit: Secondary | ICD-10-CM

## 2018-12-11 DIAGNOSIS — R471 Dysarthria and anarthria: Secondary | ICD-10-CM

## 2018-12-11 NOTE — Therapy (Signed)
[facility] [address] [place], [place], [address] Phone: [phone]   Fax:  [phone]  Speech Language Pathology Treatment  Patient Details  Name: Zachary [name] MRN: [id number] Date of Birth: May 14, 1951 Referring Provider (SLP): Alonza Bogus, DO   Encounter Date: 12/11/2018  End of Session - 12/11/18 1158    Visit Number  11    Number of Visits  17    Date for SLP Re-Evaluation  01/30/19    SLP Start Time  0937    SLP Stop Time   1016    SLP Time Calculation (min)  39 min    Activity Tolerance  Patient tolerated treatment well       Past Medical History:  Diagnosis Date  . Allergy   . Asthma   . Parkinson's disease ([place])    Congenital R fourth nerve palsy    Past Surgical History:  Procedure Laterality Date  . CATARACT EXTRACTION, BILATERAL    . COLONOSCOPY    . SHOULDER SURGERY Left    lymph nodes  . TONSILLECTOMY    . VASECTOMY      There were no vitals filed for this visit.  Subjective Assessment - 12/11/18 0949    Subjective  Sub-WNL volume entering ST room.    Patient is accompained by:  Family member   wife   Currently in Pain?  No/denies            ADULT SLP TREATMENT - 12/11/18 0952      General Information   Behavior/Cognition  Alert;Cooperative;Pleasant mood;Impulsive;Distractible      Treatment Provided   Treatment provided  Cognitive-Linquistic      Cognitive-Linquistic Treatment   Treatment focused on  Dysarthria    Skilled Treatment  Pt communicated to SLP and wife that wife's cue to pt via louder speech communicates emotion to him that distracts him from speaking louder. Today and in previous session, some of pt's responses indicated misunderstanding of emotion or the intended message of the speaker. SLP talked about and reminded pt and wife that everyday sentences need to be things pt would say EVERYDAY and reiterated the rationale for this. Loud everyday sentences  were completed with average low 80s dB with rare min A for loudness. Loud /a/ completed with average low 90s db with cues for vocalizing until pt needs to take another breath. SLP strongly encouraged pt to continue with loud /a/ and everyday sentences daily.       Assessment / Recommendations / Plan   Plan  Discharge SLP treatment due to (comment)   pt satisfied with current level; max potential     Progression Toward Goals   Progression toward goals  --   d/c day- see goal update      SLP Education - 12/11/18 1157    Education provided  Yes    Education Details  stay consistent with everyday sentences and /a/, good cues for pt/wife to use for loudness out of eyesight    Person(s) Educated  Patient;Spouse    Methods  Explanation;Demonstration    Comprehension  Verbalized understanding;Returned demonstration       SLP Short Term Goals - 11/26/18 1721      SLP SHORT TERM GOAL #1   Title  Pt will achieve average loud /a/ of average low 90sdB with usual min A over 3 sessions    Status  Partially Met   from independent -  with occasional min-mod A  SLP SHORT TERM GOAL #2   Title  Pt will use abdominal breathing at rest 50% success over 2 sessions    Status  Deferred   from 70%   --focus on loudness     SLP SHORT TERM GOAL #3   Title  Pt will use speech volume average low 70sdB when responding with sentences 16/20 over two sessions    Baseline  11-26-18    Time  --    Period  --    Status  Partially Met   from 18/20      SLP Long Term Goals - 12/11/18 1200      SLP LONG TERM GOAL #1   Title  Pt will generate loud /a/ with average of low 90s dB over 6 sessions    Period  --   or 17 sessions   Status  Partially Met   5/6 sessions     SLP LONG TERM GOAL #2   Title  Pt will use abdominal breathing 50% of the time in 5 minutes simple conversation with nonverbal cues    Period  --   or 17 sessions   Status  Deferred   for work on loudness     SLP LONG TERM GOAL #3    Title  Pt will use speech volume average mid-upper 60sdB in 5 minutes of simple conversation with occasional min A over 2 sessions    Period  --   or 17 sessions   Status  Achieved   from "rare min A"     SLP LONG TERM GOAL #4   Title  pt and/or wife will tell SLP 3 ways to maximize environmental surroundings in order to improve pt communicative successes over three sessions    Status  Partially Met   2/3 sessions      Plan - 12/11/18 1159    Clinical Impression Statement  Pt presents today with 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 Decr'd cognitive ability affects overall progress as pt had difficulty obtaining loudness > mid 60s dB for conversation involving incr'd cognitive-linguistics. See skilled intervention for more details. Pt/wife agree with d/c. Pt has met rehab potential and he and wife are pleased with pt's current level.    Speech Therapy Frequency  2x / week    Duration  --   8 weeks or 17 visits (6 weeks/13 visits?)   Treatment/Interventions  Compensatory techniques;Functional tasks;SLP instruction and feedback;Environmental controls;Multimodal communcation approach;Internal/external aids;Patient/family education;Cognitive reorganization;Cueing hierarchy    Potential to Achieve Goals  Good    Potential Considerations  Ability to learn/carryover information;Severity of impairments       Patient will benefit from skilled therapeutic intervention in order to improve the following deficits and impairments:   Dysarthria and anarthria  Cognitive communication deficit   SPEECH THERAPY DISCHARGE SUMMARY  Visits from Start of Care: 11  Current functional level related to goals / functional outcomes: Pt was able to improve his loud /a/ and volume on everyday sentences, which translated to minor improvement with pt's conversational  speech; in simple conversation of 5 minutes pt incr'd his volume to upper 60s dB compared to mid-upper 60s dB originally. Wife is using less frequent cues for loudness and has honed her cueing to become more effective with this. Pt cognitive level plays negative role in pt's overall communication however, as it is difficult for him to multitask  and think both about intended message content and increasing his loudness.   Remaining deficits: Decr'd speech loudness in min-mod complex conversation and higher conversational levels.   Education / Equipment: Education officer, environmental, loud /a/, everyday sentences   Plan: Patient agrees to discharge.  Patient goals were partially met. Patient is being discharged due to being pleased with the current functional level.  ?????and meeting current rehab potential.       Problem List Patient Active Problem List   Diagnosis Date Noted  . Parkinson's disease ([place]) 05/04/2013    [facility], [place]  12/11/2018, 12:01 PM  [facility] [address] [place], [place], [address] Phone: [phone]   Fax:  [phone]   Name: Zachary [name] MRN: [id number] Date of Birth: 1951-08-13

## 2018-12-16 ENCOUNTER — Ambulatory Visit: Payer: Medicare Other

## 2018-12-18 ENCOUNTER — Ambulatory Visit: Payer: Medicare Other

## 2018-12-22 ENCOUNTER — Encounter: Payer: Self-pay | Admitting: Occupational Therapy

## 2018-12-22 ENCOUNTER — Ambulatory Visit: Payer: Medicare Other | Admitting: Occupational Therapy

## 2018-12-22 DIAGNOSIS — R471 Dysarthria and anarthria: Secondary | ICD-10-CM | POA: Diagnosis not present

## 2018-12-22 DIAGNOSIS — R2689 Other abnormalities of gait and mobility: Secondary | ICD-10-CM

## 2018-12-22 DIAGNOSIS — R29818 Other symptoms and signs involving the nervous system: Secondary | ICD-10-CM

## 2018-12-22 DIAGNOSIS — R29898 Other symptoms and signs involving the musculoskeletal system: Secondary | ICD-10-CM

## 2018-12-22 DIAGNOSIS — R2681 Unsteadiness on feet: Secondary | ICD-10-CM

## 2018-12-22 DIAGNOSIS — R278 Other lack of coordination: Secondary | ICD-10-CM

## 2018-12-22 DIAGNOSIS — R41842 Visuospatial deficit: Secondary | ICD-10-CM

## 2018-12-22 DIAGNOSIS — R41844 Frontal lobe and executive function deficit: Secondary | ICD-10-CM

## 2018-12-22 DIAGNOSIS — R293 Abnormal posture: Secondary | ICD-10-CM

## 2018-12-22 NOTE — Patient Instructions (Signed)
   1.  Big turns to take lid off/back on.  Turn lid with right hand.  2.  When unfastening buttons, pinch button with left hand and shirt edge with right.  Pull shirt and button apart to open hole, then push button through hole.  3.  When eating, try to put down utensil between each bite.

## 2018-12-22 NOTE — Therapy (Signed)
[facility] [address] [place], [place], [address] Phone: [phone]   Fax:  [phone]  Occupational Therapy Treatment  Patient Details  Name: Zachary [name] MRN: [id number] Date of Birth: 03/06/1951 Referring Provider (OT): Dr. Arbutus Leas   Encounter Date: 12/22/2018  OT End of Session - 12/22/18 1053    Visit Number  2    Number of Visits  13    Date for OT Re-Evaluation  02/05/19    Authorization Type  [employer] Medicare    Authorization Time Period  12 weeks, pt requests to delay start of OT so that he can receive ST first.  New Cert. date 12/22/18-03/22/19    OT Start Time  0836    OT Stop Time  0919    OT Time Calculation (min)  43 min    Activity Tolerance  Patient tolerated treatment well    Behavior During Therapy  [facility] for tasks assessed/performed       Past Medical History:  Diagnosis Date  . Allergy   . Asthma   . Parkinson's disease ([facility])    Congenital R fourth nerve palsy    Past Surgical History:  Procedure Laterality Date  . CATARACT EXTRACTION, BILATERAL    . COLONOSCOPY    . SHOULDER SURGERY Left    lymph nodes  . TONSILLECTOMY    . VASECTOMY      There were no vitals filed for this visit.  Subjective Assessment - 12/22/18 0839    Subjective   Pt has had a couple falls when falling asleep.  Pt reports that this has happened 3x in last 1-2 months.    Patient is accompained by:  Family member   wife   Pertinent History  PD diagnosed 2000, asthma    Patient Stated Goals  improve performance of daily activities    Currently in Pain?  No/denies       Discussed falls and recommended pt/wife call Dr. Arbutus Leas and inform her of falling asleep while standing.  Pt/wife verbalized understanding.  Wife reports that they are trying to have pt stay in bed after taking medication, but pt resistant.  Reinforced this recommendation with pt and recommended pt try to stay in bed at least 30 min and rest until meds  begin to work and set timer to reduce risk of falls.    Educated pt/wife on timing of medications related to protein and meals due to reports of "off times."  Wife reports that pt snacks frequently and that he is eating nuts, ice cream, and peanut butter.  Recommended pt/wife discuss this with MD and that he have set pre-prepped healthy snacks that don't incorporate protein around med times.  (Wife reports that MD is aware of compulsive snacking and has reduced Mirapex because of this.)  Also practiced scooting chair up to table and recommended pt lean forward and put feet flat on floor for improved posture when eating.  Pt suddenly stood up with decr balance posteriorly due to impulsivity and stated that he needed to walk.  Pt instructed to slow down, scoot chair back, and then pause after standing before stepping.   Also recommended pt verbalize plan/strategy for difficult activities in advance with wife prompting.  Pt stated that he got up suddenly due to "pubic hair" discomfort and he reacted by standing quickly.  Practiced scooting chair back, pause, stand up, and pause before side-stepping. Also recommended pt slow down when eating and put utensil down between bites to  decr risk for choking due to impulsivity.   Practiced fastening/unfastening buttons on polo that pt was wearing and on tabletop.  Pt demo min difficulty with fastening, but mod difficulty unfastening.  Pt instructed in pull-push strategy for unfastening and returned demo with min-mod cues, but then incr ease with repetition.  Pt able to don jacket today using large amplitude strategy.  Wife reports that this is new jacket that is easier to don for pt.  Practiced putting lids on/off bottles (wife brought in) with use of large amplitude movement strategies with min-mod cueing after instruction.       OT Education - 12/22/18 1052    Education Details  Began education regarding strategies for ADLs to incr safety/ease--see pt  instructions and note    Person(s) Educated  Patient;Spouse    Methods  Explanation;Demonstration;Handout;Verbal cues    Comprehension  Verbalized understanding;Returned demonstration;Need further instruction;Verbal cues required       OT Short Term Goals - 11/05/18 0846      OT SHORT TERM GOAL #1   Title  I with PD specific HEP    Time  4    Period  Weeks    Status  New      OT SHORT TERM GOAL #2   Title  Pt will verbalize understanding of adapted strategies for ADLS/IADLS in order to maximize safety and independence.    Time  4    Period  Weeks    Status  New      OT SHORT TERM GOAL #3   Title  Pt will demonstrate improved independence with dressing as evidenced by decreasing 3 button/ unbutton time to 90 secs or less    Baseline  99 secs    Time  4    Period  Weeks    Status  New      OT SHORT TERM GOAL #4   Title  Pt/ wife will verbalize understanding of compensatory strategies for visual perceptual deficits.    Time  4    Period  Weeks    Status  New      OT SHORT TERM GOAL #5   Title  Assess shoulder ROM and set goal for functional reach prn    Time  4    Period  Weeks    Status  New      OT SHORT TERM GOAL #6   Title  ------------------------------------        OT Long Term Goals - 11/05/18 0848      OT LONG TERM GOAL #1   Title  Pt will verbalize understanding of community resources and ways to prevent future PD related complications.    Time  8    Period  Weeks    Status  New      OT LONG TERM GOAL #2   Title  Pt will verbalize understanding of compensatory strategies for short term memory deficits and ways to keep thinking skills sharp.    Time  8    Period  Weeks    Status  New      OT LONG TERM GOAL #3   Title  Pt will demonstrate ability to write a short paragraph with 100% legibility and minimal decrease in letter size    Time  8    Period  Weeks    Status  New      OT LONG TERM GOAL #4   Title  -------------------------------------  Plan - 12/22/18 1055    Clinical Impression Statement  Pt returns to OT today for the first time after evaluation due to pt request to delay OT start so that he could complete ST first.  Began education regarding strategies to incr safety/ease of ADLs.  Pt/wife verbalized understanding of strategies today but may need reinforcement.    Occupational Profile and client history currently impacting functional performance  PMH: PD diagnosed in 2000, asthma. 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 Pt participates in [employer] moves    Occupational performance deficits (Please refer to evaluation for details):  IADL's;ADL's;Rest and Sleep;Leisure;Social Participation    Rehab Potential  Good    Current Impairments/barriers affecting progress:  cognitive deficits, visual perceptual deficits, length of time since inital onset    OT Frequency  2x / week   plus eval   OT Duration  8 weeks    OT Treatment/Interventions  Self-care/ADL training;Therapeutic exercise;Visual/perceptual remediation/compensation;Patient/family education;Gait Training;Neuromuscular education;Moist Heat;Fluidtherapy;Energy conservation;Building services engineer;Therapeutic activities;Cognitive remediation/compensation;Passive range of motion;Manual Therapy;DME and/or AE instruction;Ultrasound;Cryotherapy    Plan  continue with education regarding ADL strategies/ safety    Consulted and Agree with Plan of Care  Patient    Family Member Consulted  wife       Patient will benefit from skilled therapeutic intervention in order to improve the following deficits and impairments:  Abnormal gait, Decreased knowledge of use of DME, Impaired flexibility, Impaired vision/preception, Pain, Decreased mobility, Decreased coordination, Decreased activity tolerance, Decreased endurance, Decreased range of motion, Decreased strength, Impaired tone, Impaired UE functional use, Impaired perceived functional ability,  Difficulty walking, Decreased safety awareness, Decreased knowledge of precautions, Decreased balance  Visit Diagnosis: Other symptoms and signs involving the nervous system  Other lack of coordination  Frontal lobe and executive function deficit  Abnormal posture  Unsteadiness on feet  Other abnormalities of gait and mobility  Other symptoms and signs involving the musculoskeletal system  Visuospatial deficit    Problem List Patient Active Problem List   Diagnosis Date Noted  . Parkinson's disease ([facility]) 05/04/2013    [facility] 12/22/2018, 11:02 AM  [facility] [address] [place], [place], [address] Phone: [phone]   Fax:  [phone]  Name: Zachary [name] MRN: [id number] Date of Birth: 11/12/1950   Willa Frater, OTR/L [facility] [address] [place], [place]  [address] [phone] phone [phone] 12/22/18 11:02 AM

## 2018-12-23 NOTE — Addendum Note (Signed)
Addended by: Willa Frater D on: 12/23/2018 04:20 PM   Modules accepted: Orders

## 2018-12-25 ENCOUNTER — Telehealth: Payer: Self-pay | Admitting: Neurology

## 2018-12-25 NOTE — Telephone Encounter (Signed)
Left message on machine for patient to call back.  It doesn't look like any medication was started last visit. It looks like medication was STOPPED last visit (in November). Awaiting call back to discuss.

## 2018-12-25 NOTE — Telephone Encounter (Signed)
Patient is calling in about medication that he has been taking since last appt and how he is having some issues with the medication. He seems like he was having a hard time finding his words. He also said he was fainting and falling asleep. Please call him back at [phone]. Thanks!

## 2018-12-26 NOTE — Telephone Encounter (Signed)
Left another message on machine for patient to call back.

## 2018-12-26 NOTE — Telephone Encounter (Signed)
Spoke with patient's wife and they are agreeable to wait til visit to address.

## 2018-12-26 NOTE — Telephone Encounter (Signed)
Can we address at next visit?  He has appt 4/3

## 2018-12-26 NOTE — Telephone Encounter (Signed)
Spoke with patient. He states since stopping Pramipexole he doesn't notice a difference is symptoms of sleepiness. He is okay with staying off medicine, but does want to know about adding other medication to help with increased off times.  He is taking Stalevo:  2 TABLETS AT 6:30AM, 1 TAB AT 9AM, 1 TAB AT 11:30AM, 2 AT 2PM, 1TAB AT 4:30PM, AND 7PM [place] Levodopa 50/200 - 1 tablet at bedtime.  Amantadine 100 mg - 1 tablet in the morning.  Azilect 1 mg - 1 tablet in the morning.   Please advise.

## 2018-12-29 ENCOUNTER — Other Ambulatory Visit: Payer: Self-pay | Admitting: Neurology

## 2018-12-29 DIAGNOSIS — G20 Parkinson's disease: Secondary | ICD-10-CM

## 2018-12-30 ENCOUNTER — Ambulatory Visit: Payer: Medicare Other | Attending: Neurology | Admitting: Occupational Therapy

## 2018-12-30 DIAGNOSIS — R41842 Visuospatial deficit: Secondary | ICD-10-CM | POA: Insufficient documentation

## 2018-12-30 DIAGNOSIS — R293 Abnormal posture: Secondary | ICD-10-CM

## 2018-12-30 DIAGNOSIS — R41844 Frontal lobe and executive function deficit: Secondary | ICD-10-CM | POA: Diagnosis present

## 2018-12-30 DIAGNOSIS — R29818 Other symptoms and signs involving the nervous system: Secondary | ICD-10-CM

## 2018-12-30 DIAGNOSIS — R278 Other lack of coordination: Secondary | ICD-10-CM | POA: Diagnosis present

## 2018-12-30 NOTE — Patient Instructions (Signed)
Coordination Exercises  Perform the following exercises for 20 minutes 1 times per day. Perform with both hand(s). Perform using big movements.   Flipping Cards: Place deck of cards on the table. Flip cards over by opening your hand big to grasp and then turn your palm up big.  Deal cards: Hold 1/2 or whole deck in your hand. Use thumb to push card off top of deck with one big push.  Rotate ball with fingertips: Pick up with fingers/thumb and move as much as you can with each turn/movement (clockwise and counter-clockwise).  Pick up coins and stack one at a time: Pick up with big, intentional movements. Do not drag coin to the edge. (5-10 in a stack)  Pick up 5-10 coins one at a time and hold in palm. Then, move coins from palm to fingertips one at time and place in coin bank/container.  Perform "Flicks"/hand stretches ([employer] Hands): Close hands then flick out your fingers with focus on opening hands, pulling wrists back, and extending elbows like you are pushing. 

## 2018-12-30 NOTE — Therapy (Signed)
[facility] [address] [place], [place], [address] Phone: [phone]   Fax:  [phone]  Occupational Therapy Treatment  Patient Details  Name: Zachary [name] MRN: [id number] Date of Birth: 1950/12/01 Referring Provider (OT): Dr. Arbutus Leas   Encounter Date: 12/30/2018  OT End of Session - 12/30/18 1302    Visit Number  3    Number of Visits  13    Date for OT Re-Evaluation  02/05/19    Authorization Type  [employer] Medicare    Authorization Time Period  12 weeks, pt requests to delay start of OT so that he can receive ST first.  New Cert. date 12/22/18-03/22/19    OT Start Time  0850    OT Stop Time  0930    OT Time Calculation (min)  40 min    Activity Tolerance  Patient tolerated treatment well    Behavior During Therapy  [facility] for tasks assessed/performed       Past Medical History:  Diagnosis Date  . Allergy   . Asthma   . Parkinson's disease (HCC)    Congenital R fourth nerve palsy    Past Surgical History:  Procedure Laterality Date  . CATARACT EXTRACTION, BILATERAL    . COLONOSCOPY    . SHOULDER SURGERY Left    lymph nodes  . TONSILLECTOMY    . VASECTOMY      There were no vitals filed for this visit.           Treatment: Reinforced strategies for eating, putting spoon down between bites, pt simulated, mod v.c Strategies for opening containers with big movements, mod v.c Sit to stand from a chair, breaking it down into several steps, (scoot cahir back from table, scoot to edge of chair, check foot position, then standing)             OT Education - 12/30/18 1304    Education provided  Yes    Education Details  initial coordination HEP    Person(s) Educated  Patient;Spouse    Methods  Explanation;Demonstration;Handout;Verbal cues    Comprehension  Verbalized understanding;Returned demonstration;Verbal cues required       OT Short Term Goals - 11/05/18 0846      OT SHORT TERM GOAL  #1   Title  I with PD specific HEP    Time  4    Period  Weeks    Status  New      OT SHORT TERM GOAL #2   Title  Pt will verbalize understanding of adapted strategies for ADLS/IADLS in order to maximize safety and independence.    Time  4    Period  Weeks    Status  New      OT SHORT TERM GOAL #3   Title  Pt will demonstrate improved independence with dressing as evidenced by decreasing 3 button/ unbutton time to 90 secs or less    Baseline  99 secs    Time  4    Period  Weeks    Status  New      OT SHORT TERM GOAL #4   Title  Pt/ wife will verbalize understanding of compensatory strategies for visual perceptual deficits.    Time  4    Period  Weeks    Status  New      OT SHORT TERM GOAL #5   Title  Assess shoulder ROM and set goal for functional reach prn    Time  4  Period  Weeks    Status  New      OT SHORT TERM GOAL #6   Title  ------------------------------------        OT Long Term Goals - 11/05/18 0848      OT LONG TERM GOAL #1   Title  Pt will verbalize understanding of community resources and ways to prevent future PD related complications.    Time  8    Period  Weeks    Status  New      OT LONG TERM GOAL #2   Title  Pt will verbalize understanding of compensatory strategies for short term memory deficits and ways to keep thinking skills sharp.    Time  8    Period  Weeks    Status  New      OT LONG TERM GOAL #3   Title  Pt will demonstrate ability to write a short paragraph with 100% legibility and minimal decrease in letter size    Time  8    Period  Weeks    Status  New      OT LONG TERM GOAL #4   Title  -------------------------------------            Plan - 12/30/18 1302    Clinical Impression Statement  Pt/ wife verbalize understanding of fine motor coordination HEP.    Occupational Profile and client history currently impacting functional performance  PMH: PD diagnosed in 2000, asthma. 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 Pt participates in [employer] moves    Occupational performance deficits (Please refer to evaluation for details):  IADL's;ADL's;Rest and Sleep;Leisure;Social Participation    Pt will benefit from skilled therapeutic intervention in order to improve on the following performance deficits  Body Structure / Function / Physical Skills    Body Structure / Function / Physical Skills  ADL;Coordination;Endurance;GMC;Balance;Flexibility;Dexterity;FMC;ROM;Gait;Mobility    Rehab Potential  Good    Clinical Decision Making  Limited treatment options, no task modification necessary    OT Frequency  2x / week    OT Duration  8 weeks    OT Treatment/Interventions  Self-care/ADL training;Therapeutic exercise;Visual/perceptual remediation/compensation;Patient/family education;Gait Training;Neuromuscular education;Moist Heat;Fluidtherapy;Energy conservation;Building services engineer;Therapeutic activities;Cognitive remediation/compensation;Passive range of motion;Manual Therapy;DME and/or AE instruction;Ultrasound;Cryotherapy    Plan  continue with education regarding ADL strategies/ safety, add to HEP    Consulted and Agree with Plan of Care  Patient    Family Member Consulted  wife        Patient will benefit from skilled therapeutic intervention in order to improve the following deficits and impairments:   coordination, timing, balance   Visit Diagnosis: Other symptoms and signs involving the nervous system  Other lack of coordination  Frontal lobe and executive function deficit  Abnormal posture    Problem List Patient Active Problem List   Diagnosis Date Noted  . Parkinson's disease (HCC) 05/04/2013    Zachary [name] 12/30/2018, 1:05 PM  [facility] [address] [place], [place], [address] Phone: [phone]   Fax:  [phone]  Name: Zachary [name] MRN: [id number] Date of Birth: 22-Apr-1951

## 2019-01-01 ENCOUNTER — Ambulatory Visit: Payer: Medicare Other | Admitting: Occupational Therapy

## 2019-01-01 DIAGNOSIS — R293 Abnormal posture: Secondary | ICD-10-CM

## 2019-01-01 DIAGNOSIS — R278 Other lack of coordination: Secondary | ICD-10-CM

## 2019-01-01 DIAGNOSIS — R29818 Other symptoms and signs involving the nervous system: Secondary | ICD-10-CM | POA: Diagnosis not present

## 2019-01-01 DIAGNOSIS — R41842 Visuospatial deficit: Secondary | ICD-10-CM

## 2019-01-01 DIAGNOSIS — R41844 Frontal lobe and executive function deficit: Secondary | ICD-10-CM

## 2019-01-02 NOTE — Therapy (Signed)
[facility] [address] [place], [place], [address] Phone: [phone]   Fax:  [phone]  Occupational Therapy Treatment  Patient Details  Name: Zachary [name] MRN: [id number] Date of Birth: [birth year] Referring Provider (OT): Dr. Arbutus Leas   Encounter Date: 01/01/2019  OT End of Session - 01/01/19 0853    Visit Number  4    Number of Visits  13    Date for OT Re-Evaluation  02/05/19    Authorization Type  [employer] Medicare    Authorization Time Period  12 weeks, pt requests to delay start of OT so that he can receive ST first.  New Cert. date 12/22/18-03/22/19    OT Start Time  0850    OT Stop Time  0930    OT Time Calculation (min)  40 min       Past Medical History:  Diagnosis Date  . Allergy   . Asthma   . Parkinson's disease (HCC)    Congenital R fourth nerve palsy    Past Surgical History:  Procedure Laterality Date  . CATARACT EXTRACTION, BILATERAL    . COLONOSCOPY    . SHOULDER SURGERY Left    lymph nodes  . TONSILLECTOMY    . VASECTOMY      There were no vitals filed for this visit.  Subjective Assessment - 01/02/19 1513    Pertinent History  PD diagnosed 2000, asthma    Patient Stated Goals  improve performance of daily activities    Currently in Pain?  No/denies                 Treatment: Reviewed activities form HEP for reinforcement of larger amplitude movement strategies: flipping/ dealing cards, stacking and manipulating coins, and rotating ball. Pt requires min-mod v.c and demonstrates max difficulty rotating ball with LUE. Pt practiced crumpling up plastic bag in each hand with larger finger movements to simulate pulling up socks, min-mod v.c Copying simple small peg design with RUE for increased fine motor coordination with a cognitive component, min v.c            OT Short Term Goals - 01/02/19 1515      OT SHORT TERM GOAL #1   Title  I with PD specific HEP    Time  4     Period  Weeks    Status  New      OT SHORT TERM GOAL #2   Title  Pt will verbalize understanding of adapted strategies for ADLS/IADLS in order to maximize safety and independence.    Time  4    Period  Weeks    Status  New      OT SHORT TERM GOAL #3   Title  Pt will demonstrate improved independence with dressing as evidenced by decreasing 3 button/ unbutton time to 90 secs or less    Baseline  99 secs    Time  4    Period  Weeks    Status  New      OT SHORT TERM GOAL #4   Title  Pt/ wife will verbalize understanding of compensatory strategies for visual perceptual deficits.    Time  4    Period  Weeks    Status  New      OT SHORT TERM GOAL #5   Title  Pt will demonstrate ability to retrieve a lightweight object at 130 shoulder flexion with -10 elbow extension bilaterally.    Baseline  RUE shoulder flexion  135, -15 elbow ext, LUE 135, -20 elbow extension.    Time  4    Period  Weeks    Status  New      OT SHORT TERM GOAL #6   Title  ------------------------------------        OT Long Term Goals - 11/05/18 0848      OT LONG TERM GOAL #1   Title  Pt will verbalize understanding of community resources and ways to prevent future PD related complications.    Time  8    Period  Weeks    Status  New      OT LONG TERM GOAL #2   Title  Pt will verbalize understanding of compensatory strategies for short term memory deficits and ways to keep thinking skills sharp.    Time  8    Period  Weeks    Status  New      OT LONG TERM GOAL #3   Title  Pt will demonstrate ability to write a short paragraph with 100% legibility and minimal decrease in letter size    Time  8    Period  Weeks    Status  New      OT LONG TERM GOAL #4   Title  -------------------------------------            Plan - 01/02/19 1514    Clinical Impression Statement  Pt is progressing towards goals. He benefits from repetition/ review of strategies due to significant cognitive deficits.     Occupational Profile and client history currently impacting functional performance  PMH: PD diagnosed in 2000, asthma. 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 Pt participates in [employer] moves    Occupational performance deficits (Please refer to evaluation for details):  IADL's;ADL's;Rest and Sleep;Leisure;Social Participation    Body Structure / Function / Physical Skills  ADL;Coordination;Endurance;GMC;Balance;Flexibility;Dexterity;FMC;ROM;Gait;Mobility    Rehab Potential  Good    OT Frequency  2x / week    OT Duration  8 weeks    OT Treatment/Interventions  Self-care/ADL training;Therapeutic exercise;Visual/perceptual remediation/compensation;Patient/family education;Gait Training;Neuromuscular education;Moist Heat;Fluidtherapy;Energy conservation;Building services engineer;Therapeutic activities;Cognitive remediation/compensation;Passive range of motion;Manual Therapy;DME and/or AE instruction;Ultrasound;Cryotherapy    Plan  continue with education regarding ADL strategies/ safety,       Patient will benefit from skilled therapeutic intervention in order to improve the following deficits and impairments:  Body Structure / Function / Physical Skills  Visit Diagnosis: Other symptoms and signs involving the nervous system  Other lack of coordination  Frontal lobe and executive function deficit  Abnormal posture  Visuospatial deficit    Problem List Patient Active Problem List   Diagnosis Date Noted  . Parkinson's disease (HCC) 05/04/2013    Suzie Vandam 01/02/2019, 3:17 PM  [facility] [address] [place], [place], [address] Phone: [phone]   Fax:  [phone]  Name: Zachary [name] MRN: [id number] Date of Birth: 02-19-1951

## 2019-01-06 ENCOUNTER — Encounter: Payer: Medicare Other | Admitting: Occupational Therapy

## 2019-01-08 ENCOUNTER — Ambulatory Visit: Payer: Medicare Other | Admitting: Occupational Therapy

## 2019-01-08 DIAGNOSIS — R278 Other lack of coordination: Secondary | ICD-10-CM

## 2019-01-08 DIAGNOSIS — R293 Abnormal posture: Secondary | ICD-10-CM

## 2019-01-08 DIAGNOSIS — R41844 Frontal lobe and executive function deficit: Secondary | ICD-10-CM

## 2019-01-08 DIAGNOSIS — R29818 Other symptoms and signs involving the nervous system: Secondary | ICD-10-CM

## 2019-01-08 NOTE — Therapy (Signed)
[facility] [address] [place], [place], [address] Phone: [phone]   Fax:  [phone]  Occupational Therapy Treatment  Patient Details  Name: Zachary [name] MRN: [id number] Date of Birth: 11/25/1950 Referring Provider (OT): Dr. Arbutus Leas   Encounter Date: 01/08/2019  OT End of Session - 01/08/19 1444    Visit Number  5    Number of Visits  13    Date for OT Re-Evaluation  02/05/19    Authorization Type  [employer] Medicare    Authorization Time Period  12 weeks, pt requests to delay start of OT so that he can receive ST first.  New Cert. date 12/22/18-03/22/19       Past Medical History:  Diagnosis Date  . Allergy   . Asthma   . Parkinson's disease (HCC)    Congenital R fourth nerve palsy    Past Surgical History:  Procedure Laterality Date  . CATARACT EXTRACTION, BILATERAL    . COLONOSCOPY    . SHOULDER SURGERY Left    lymph nodes  . TONSILLECTOMY    . VASECTOMY      There were no vitals filed for this visit.  Subjective Assessment - 01/08/19 1735    Subjective   Denies pain    Pertinent History  PD diagnosed 2000, asthma    Patient Stated Goals  improve performance of daily activities    Currently in Pain?  No/denies                    Treatment: Discussed strategies for feeding. Pt reports continued difficulty with self feeding. Pt reports he forgets to pause between bites and rest spoon. Pt reports he resents his wife if she says anything.Therapist recommends pt places a sticky note at his place at dinner table to cue him to slow down. Pt verbalizes agreement. Reviewed rotating ball with larger amplitude movements and safe techniques for sit to stand , min v.c Dynamic step and reach to flip large playing cards with large amplitude movements with metronome as a cue for timing, min to mod v.c for larger amplitude movements, metronome was noted to be helpful with pt timing and gait speed  while  ambulating to waiting room end of session.          OT Short Term Goals - 01/02/19 1515      OT SHORT TERM GOAL #1   Title  I with PD specific HEP    Time  4    Period  Weeks    Status  New      OT SHORT TERM GOAL #2   Title  Pt will verbalize understanding of adapted strategies for ADLS/IADLS in order to maximize safety and independence.    Time  4    Period  Weeks    Status  New      OT SHORT TERM GOAL #3   Title  Pt will demonstrate improved independence with dressing as evidenced by decreasing 3 button/ unbutton time to 90 secs or less    Baseline  99 secs    Time  4    Period  Weeks    Status  New      OT SHORT TERM GOAL #4   Title  Pt/ wife will verbalize understanding of compensatory strategies for visual perceptual deficits.    Time  4    Period  Weeks    Status  New      OT SHORT TERM GOAL #  5   Title  Pt will demonstrate ability to retrieve a lightweight object at 130 shoulder flexion with -10 elbow extension bilaterally.    Baseline  RUE shoulder flexion 135, -15 elbow ext, LUE 135, -20 elbow extension.    Time  4    Period  Weeks    Status  New      OT SHORT TERM GOAL #6   Title  ------------------------------------        OT Long Term Goals - 11/05/18 0848      OT LONG TERM GOAL #1   Title  Pt will verbalize understanding of community resources and ways to prevent future PD related complications.    Time  8    Period  Weeks    Status  New      OT LONG TERM GOAL #2   Title  Pt will verbalize understanding of compensatory strategies for short term memory deficits and ways to keep thinking skills sharp.    Time  8    Period  Weeks    Status  New      OT LONG TERM GOAL #3   Title  Pt will demonstrate ability to write a short paragraph with 100% legibility and minimal decrease in letter size    Time  8    Period  Weeks    Status  New      OT LONG TERM GOAL #4   Title  -------------------------------------            Plan -  01/08/19 1736    Clinical Impression Statement  Pt is progressing towards goals. He demonstrates improved timing with use of metronome during functional activity.    Occupational Profile and client history currently impacting functional performance  PMH: PD diagnosed in 2000, asthma. 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 Pt participates in [employer] moves    Occupational performance deficits (Please refer to evaluation for details):  IADL's;ADL's;Rest and Sleep;Leisure;Social Participation    Body Structure / Function / Physical Skills  ADL;Coordination;Endurance;GMC;Balance;Flexibility;Dexterity;FMC;ROM;Gait;Mobility    Rehab Potential  Good    OT Frequency  2x / week    OT Duration  8 weeks    OT Treatment/Interventions  Self-care/ADL training;Therapeutic exercise;Visual/perceptual remediation/compensation;Patient/family education;Gait Training;Neuromuscular education;Moist Heat;Fluidtherapy;Energy conservation;Building services engineer;Therapeutic activities;Cognitive remediation/compensation;Passive range of motion;Manual Therapy;DME and/or AE instruction;Ultrasound;Cryotherapy    Plan  continue with education regarding ADL strategies/ safety, consider metronome with ambulation/ functional activity    Family Member Consulted  wife       Patient will benefit from skilled therapeutic intervention in order to improve the following deficits and impairments:  Body Structure / Function / Physical Skills  Visit Diagnosis: Other symptoms and signs involving the nervous system  Other lack of coordination  Frontal lobe and executive function deficit  Abnormal posture    Problem List Patient Active Problem List   Diagnosis Date Noted  . Parkinson's disease (HCC) 05/04/2013    Zachary [name] 01/08/2019, 5:49 PM  [facility] [address] [place], [place], [address] Phone: [phone]   Fax:   [phone]  Name: Zachary [name] MRN: [id number] Date of Birth: July 26, 1951

## 2019-01-13 ENCOUNTER — Ambulatory Visit: Payer: Medicare Other | Admitting: Occupational Therapy

## 2019-01-14 ENCOUNTER — Ambulatory Visit: Payer: Medicare Other | Admitting: Occupational Therapy

## 2019-01-15 ENCOUNTER — Encounter: Payer: Medicare Other | Admitting: Occupational Therapy

## 2019-01-19 ENCOUNTER — Telehealth: Payer: Self-pay | Admitting: Neurology

## 2019-01-19 ENCOUNTER — Telehealth: Payer: Self-pay | Admitting: Occupational Therapy

## 2019-01-19 NOTE — Telephone Encounter (Signed)
Spoke with patient and verified medications for e-visit tomorrow.  He does not have access to a blood pressure machine. His weight is 170 lbs.  Dr. Arbutus Leas Lorain Childes.

## 2019-01-19 NOTE — Telephone Encounter (Signed)
Patient and wife were contacted today regarding the temporary closing of [facility] due to [place].  Therapist discussed:  patient plans to return to therapy in April.  Therapist did not address e-visits as pt. Has a good HEP consisting  of [employer]! Exercises.    [facility] will follow up with patients when we are able to resume care.  Keene Breath, OTR/L [phone] Phone: [phone] 2:41 PM 01/19/19 [facility] [address] [place], [place]  [address] Phone:  [phone] Fax:  [phone] \

## 2019-01-19 NOTE — Telephone Encounter (Signed)
Spoke with patient and verified that they do have a video compatible device. Best number to reach them is [phone] or [phone]. Email: [email or web address] or [email or web address]

## 2019-01-20 ENCOUNTER — Telehealth (INDEPENDENT_AMBULATORY_CARE_PROVIDER_SITE_OTHER): Payer: Medicare Other | Admitting: Neurology

## 2019-01-20 ENCOUNTER — Encounter: Payer: Self-pay | Admitting: Neurology

## 2019-01-20 ENCOUNTER — Encounter: Payer: Medicare Other | Admitting: Occupational Therapy

## 2019-01-20 ENCOUNTER — Other Ambulatory Visit: Payer: Self-pay

## 2019-01-20 DIAGNOSIS — G20 Parkinson's disease: Secondary | ICD-10-CM

## 2019-01-20 DIAGNOSIS — G249 Dystonia, unspecified: Secondary | ICD-10-CM | POA: Diagnosis not present

## 2019-01-20 NOTE — Progress Notes (Signed)
Virtual Visit via Video Note The purpose of this virtual visit is to provide medical care while limiting exposure to the novel coronavirus.    Consent was obtained for video visit:  Yes.   Answered questions that patient had about telehealth interaction:  Yes.    Pt/wife present at video I discussed the limitations, risks, security and privacy concerns of performing an evaluation and management service by telemedicine. I also discussed with the patient that there may be a patient responsible charge related to this service. The patient expressed understanding and agreed to proceed.  Pt location: Home Physician Location: office Name of referring provider:  Martha Clan, MD I connected with Zachary [name] at patients initiation/request on 01/20/2019 at  1:00 PM EDT by video enabled telemedicine application and verified that I am speaking with the correct person using two identifiers.    Pt MRN:  [id number] Pt DOB:  [birth year]   History of Present Illness: Patient seen for Parkinson's disease.  He is on Stalevo 100, 2 tablets at 6:30 AM, 1 tablet at 9 AM, 1 tablet at 11:30 AM, 2 tablets at 2 PM, 1 tablet at 4:30 PM, 1 tablet at 7 PM.  Notes intermittent off symptoms.  He is on carbidopa/levodopa 50/200 at bedtime.  He was to wean off of his low-dose pramipexole last visit because of memory change and hallucinations.  He reports that he had no SE with d/c the medication.  He is still on Azilect.  He is on amantadine, 100 mg, 1 tablet in the morning.  Has occasional dyskinesia.  He had a few falls since our last visit, mostly last month.  These were upon arising in the AM and then seemed to almost fall asleep while walking.  He is now getting up, taking his medication and going back to sleep for 30 min and that has helped.  He has some visual distortions and will sometimes reach for the object and then realize its not there.  He is having softening of the speech.  Was finding speech therapy helpful, but  they had to stop that.  Has been attending exercise therapy with [facility], but has stopped that because of coronavirus.    Observations/Objective:   Weight: 170 lb (77.1 kg)    GEN:  The patient appears stated age and is in NAD.  Neurological examination:  Orientation: The patient is alert and oriented x3. Cranial nerves: There is good facial symmetry. There is facial hypomimia.  The speech is fluent and somewhat dysarthric, but some of this may be due to telemedicine platform.  He is hypophonic.  Soft palate rises symmetrically and there is no tongue deviation. Hearing is intact to conversational tone. Motor: Strength is at least antigravity x 4.   Shoulder shrug is equal and symmetric.  There is no pronator drift.  Movement examination: Tone: unable Abnormal movements: no tremor Coordination:  There is mild decremation with RAM's, especially with finger taps on the left.  I was unable to see his legs for toe taps and heel taps bilaterally.  Gait and Station: The patient easily arises out of the chair without the use of his hands.  He walks well with good arm swing.   Assessment and Plan:   1.  PD, dx 06/1999             -He will continue Stalevo 2 tablets at 6:30 am, 1 tablet at 9:00 am, 1 tablets at 11:30 am, 2 tablet at 2 pm, 1 tablet  at 4:30 pm, 1 tablet at 7:00 pm.     Can take an extra if needed, but needs to call me if he finds himself consistently taking an extra.  Talked about proper timing of medication.             -He will continue carbidopa/levodopa 50/200 CR at bedtime (930 PM).               -off of pramipexole  -d/c azilect  -continue amantadine for now but may d/c that in the future.  Only on 100 mg in AM             -didn't like inbrija   -Discussed in detail Covid-19 and risk factors for this, including age and PD.  Discussed importance of social distancing.  Discussed importance of staying home at all times, as is feasible.  Discussed taking advantage of grocery store  hours for the elderly.  Pt expressed understanding.  2.  REM behavior disorder             -This is commonly associated with PD and the patient is experiencing this.  We discussed that this can be very serious and even harmful.  We talked about medications as well as physical barriers to put in the bed (particularly soft bed rails, pillow barriers).  We talked about moving the night stand so that it is not so close to the side of the bed.             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  3.  Mild cognitive impairment             -Patient had neurocognitive testing on October 31, 2017.  There was only evidence of mild cognitive impairment, and no dementia.    Follow Up Instructions:    -I discussed the assessment and treatment plan with the patient. The patient was provided an opportunity to ask questions and all were answered. The patient agreed with the plan and demonstrated an understanding of the instructions.   The patient was advised to call back or seek an in-person evaluation if the symptoms worsen or if the condition fails to improve as anticipated.    Total Time spent in visit with the patient was:  28 min, of which more than 50% of the time was spent in counseling and/or coordinating care on safety with PD.   Pt understands and agrees with the plan of care outlined.     [name], DO

## 2019-01-22 ENCOUNTER — Encounter: Payer: Medicare Other | Admitting: Occupational Therapy

## 2019-01-27 ENCOUNTER — Encounter: Payer: Medicare Other | Admitting: Occupational Therapy

## 2019-01-29 ENCOUNTER — Encounter: Payer: Medicare Other | Admitting: Occupational Therapy

## 2019-01-29 ENCOUNTER — Telehealth: Payer: Self-pay | Admitting: Occupational Therapy

## 2019-01-29 NOTE — Telephone Encounter (Signed)
Zachary [name] was contacted today regarding temporary reduction of Outpatient [facility] Services due to concerns for community transmission of COVID-19.  Patient identity was verified.  Assessed if patient needed to be seen in person by clinician (recent fall or acute injury that requires hands on assessment and advice, change in diet order, post-surgical, special cases, etc.).     Patient did not have an acute/special need that requires in person visit. Proceeded with phone call.  Therapist advised the patient to continue to perform his/her HEP and assured he/she had no unanswered questions or concerns at this time.Therapist recommended pt does not jump rope due to fall risk.  The patient was offered and declined the continuation of their plan of care by using methods such as an E-Visit, virtual check in, or Telehealth visit.  [facility] will follow up with this client when we are able to safely resume care at the [facility] [address] [facility] in person. Patient is aware we can be reached by telephone during limited business hours in the meantime.   Keene Breath, OTR/L [phone] Phone: [phone] 1:56 PM 01/29/19

## 2019-01-30 ENCOUNTER — Ambulatory Visit: Payer: Medicare Other | Admitting: Neurology

## 2019-02-03 ENCOUNTER — Encounter: Payer: Medicare Other | Admitting: Occupational Therapy

## 2019-02-05 ENCOUNTER — Encounter: Payer: Medicare Other | Admitting: Occupational Therapy

## 2019-02-23 ENCOUNTER — Telehealth: Payer: Self-pay | Admitting: Neurology

## 2019-02-23 NOTE — Telephone Encounter (Signed)
My notes don't reflect telling him to stop the amantadine.  Looks like I may have stopped the azilect (rasagaline).  Please confirm with patient.

## 2019-02-23 NOTE — Telephone Encounter (Signed)
Patient left another mess age with the after hours answering service    Patient states that he is talking carbodopa levodopa from 6:30 am to 7:30 pm every 2.5 hours he is suppose to talk 2 pills at 6:30 and more throughout the day. He was suppose to stop the amantadine 2 days ago and became very fatigue and weak

## 2019-02-23 NOTE — Telephone Encounter (Signed)
Patient left this message with the after hour answering service   Patient states he has adjusted his pills as the Dr recommended over video visit. States he is having rigidity spells with his parkinson wanting to know what the Dr thinks. Should he add the old pill back in states he has had two episodes of rigidity today. 1 between 9-10 and the other mid afternoon called would lik a virtual appt tomorrow or Thursday

## 2019-02-23 NOTE — Telephone Encounter (Signed)
The patient contacted the clinic and we went through his medications, expressed to him that he was to remain on the amantadine 100mg . Patient stated he has been off of it for 2 days and will start back on it.

## 2019-02-26 ENCOUNTER — Other Ambulatory Visit: Payer: Self-pay | Admitting: Neurology

## 2019-02-26 DIAGNOSIS — G20 Parkinson's disease: Secondary | ICD-10-CM

## 2019-03-05 ENCOUNTER — Telehealth: Payer: Self-pay | Admitting: Neurology

## 2019-03-05 ENCOUNTER — Other Ambulatory Visit: Payer: Self-pay | Admitting: Neurology

## 2019-03-05 DIAGNOSIS — R413 Other amnesia: Secondary | ICD-10-CM

## 2019-03-05 DIAGNOSIS — G20 Parkinson's disease: Secondary | ICD-10-CM

## 2019-03-05 MED ORDER — CARBIDOPA-LEVODOPA-ENTACAPONE 25-100-200 MG PO TABS: ORAL_TABLET | ORAL | 1 refills | Status: DC

## 2019-03-05 NOTE — Telephone Encounter (Signed)
Refilled Rx carbidopa-levodopa 25/100--#270, R-1 sent to [facility].

## 2019-03-05 NOTE — Telephone Encounter (Signed)
Patient called regarding his medication Carbidopa Levodopa  and needing a Refill. He said is now taking 9 and was taking 8. He uses [facility]. Please Call. Thanks

## 2019-03-30 ENCOUNTER — Other Ambulatory Visit: Payer: Self-pay | Admitting: Neurology

## 2019-03-30 DIAGNOSIS — G20 Parkinson's disease: Secondary | ICD-10-CM

## 2019-03-30 DIAGNOSIS — G20A1 Parkinson's disease without dyskinesia, without mention of fluctuations: Secondary | ICD-10-CM

## 2019-03-30 NOTE — Telephone Encounter (Signed)
Provider approved 

## 2019-03-30 NOTE — Telephone Encounter (Signed)
Requested Prescriptions   Pending Prescriptions Disp Refills  . amantadine (SYMMETREL) 100 MG capsule [Pharmacy Med Name: AMANTADINE 100 MG CAPSULE] 30 capsule 0    Sig: TAKE 1 CAPSULE DAILY.   Rx last filled: 12/29/18 #90 0 refils  Pt last seen: 01/20/19 Assessment and Plan:   1. PD, dx 06/1999 -He will continue Stalevo 2 tablets at 6:30 am, 1 tablet at 9:00 am, 1 tablets at 11:30 am, 2 tablet at 2 pm, 1 tablet at 4:30 pm, 1 tablet at 7:00 pm.   Can take an extra if needed, but needs to call me if he finds himself consistently taking an extra.  Talked about proper timing of medication. -He will continue carbidopa/levodopa 50/200 CR at bedtime (930 PM).  -off of pramipexole             -d/c azilect             -continue amantadine for now but may d/c that in the future.  Only on 100 mg in AM  Follow up appt scheduled: 06/22/19

## 2019-04-01 ENCOUNTER — Ambulatory Visit: Payer: Medicare Other | Admitting: Occupational Therapy

## 2019-04-07 ENCOUNTER — Telehealth: Payer: Self-pay

## 2019-04-07 ENCOUNTER — Other Ambulatory Visit: Payer: Self-pay

## 2019-04-07 ENCOUNTER — Ambulatory Visit: Payer: Medicare Other | Attending: Neurology | Admitting: Occupational Therapy

## 2019-04-07 DIAGNOSIS — R41842 Visuospatial deficit: Secondary | ICD-10-CM | POA: Diagnosis present

## 2019-04-07 DIAGNOSIS — R29818 Other symptoms and signs involving the nervous system: Secondary | ICD-10-CM | POA: Insufficient documentation

## 2019-04-07 DIAGNOSIS — R2689 Other abnormalities of gait and mobility: Secondary | ICD-10-CM | POA: Diagnosis present

## 2019-04-07 DIAGNOSIS — R29898 Other symptoms and signs involving the musculoskeletal system: Secondary | ICD-10-CM

## 2019-04-07 DIAGNOSIS — R293 Abnormal posture: Secondary | ICD-10-CM | POA: Diagnosis present

## 2019-04-07 DIAGNOSIS — G20 Parkinson's disease: Secondary | ICD-10-CM

## 2019-04-07 DIAGNOSIS — R2681 Unsteadiness on feet: Secondary | ICD-10-CM

## 2019-04-07 DIAGNOSIS — R278 Other lack of coordination: Secondary | ICD-10-CM | POA: Diagnosis present

## 2019-04-07 DIAGNOSIS — R41844 Frontal lobe and executive function deficit: Secondary | ICD-10-CM | POA: Insufficient documentation

## 2019-04-07 NOTE — Telephone Encounter (Signed)
-----   Message from Hulda Marin, OT sent at 04/07/2019  2:15 PM EDT ----- Regarding: updated referrals for therapy Dr. Laurena Spies returned to OT within the clinic today after taking a break due to Covid-19. It has been approximately 6 months since the initial therapy referral was placed.  Will you please place an updated referral for OT, PT, ST , as pt would like to return to all 3 disciplines. Hope you are doing well. Sincerely, Theone Murdoch, OTR/L

## 2019-04-07 NOTE — Therapy (Signed)
[facility] [address] [place], [place], [address] Phone: [phone]   Fax:  [phone]  Occupational Therapy Re-Evaluation  Patient Details  Name: Zachary [name] MRN: [id number] Date of Birth: [birth year] Referring Provider (OT): Dr. Arbutus Leasat   Encounter Date: 04/07/2019  OT End of Session - 04/07/19 1423    Visit Number  6   re-eval today   Number of Visits  22    Date for OT Re-Evaluation  06/06/19    Authorization Type  UHC Medicare    Authorization Time Period  12 weeks, POC written for 8 weeks, pt requests therapy 1x week initally    OT Start Time  1003    OT Stop Time  1045    OT Time Calculation (min)  42 min    Activity Tolerance  Patient tolerated treatment well    Behavior During Therapy  [facility] for tasks assessed/performed       Past Medical History:  Diagnosis Date  . Allergy   . Asthma   . Parkinson's disease (HCC)    Congenital R fourth nerve palsy    Past Surgical History:  Procedure Laterality Date  . CATARACT EXTRACTION, BILATERAL    . COLONOSCOPY    . SHOULDER SURGERY Left    lymph nodes  . TONSILLECTOMY    . VASECTOMY      There were no vitals filed for this visit.  Subjective Assessment - 04/07/19 1006    Subjective   Denies pain    Pertinent History  PD diagnosed 2000, asthma    Patient Stated Goals  improve performance of daily activities    Currently in Pain?  No/denies        [facility] - 04/07/19 0001      Assessment   Medical Diagnosis  Parkinson's disease    Referring Provider (OT)  Dr. Arbutus Leasat    Onset Date/Surgical Date  11/05/18   inital eval, returns today after break due to covid-19 prec.   Hand Dominance  Right      Precautions   Precautions  Fall    Precaution Comments  Not driving      Balance Screen   Has the patient fallen in the past 6 months  No      Prior Function   Level of Independence  Independent with gait;Needs assistance with ADLs    Vocation  Retired    Comments  has not been exercising as much due to closure of gyms/ Covid prec.      ADL   ADL comments  reports freezing in shower      Mobility   Mobility Status  History of falls;Freezing      Written Expression   Handwriting  75% legible      Vision - History   Additional Comments  hx of depth perception problems      Vision Assessment   Vision Assessment  Vision impaired  _ to be further tested in functional context    Comment  Pt appears to have visual perceptual difficulties      Cognition   Overall Cognitive Status  Impaired/Different from baseline    Attention  Selective    Selective Attention  Impaired    Memory  Impaired    Memory Impairment  Decreased short term memory    Awareness  Impaired    Awareness Impairment  Intellectual impairment;Emergent impairment;Anticipatory impairment    Problem Solving  Impaired    Behaviors  Impulsive;Restless  Observation/Other Assessments   Donning Doffing Jacket Comments  --   3 button/ unbutton 2 mins 16 secs to fasten 3 buttons     Coordination   Gross Motor Movements are Fluid and Coordinated  No    Fine Motor Movements are Fluid and Coordinated  No    Right 9 Hole Peg Test  23.68    Left 9 Hole Peg Test  32.25    Box and Blocks  RUE 53 blocks, LUE 60 blocks bradykinesia present           CLINIC OPERATION CHANGES: Outpatient Neuro Rehab is open at lower capacity following universal masking, social distancing, and patient screening.  The patient's COVID risk of complications score is 3 Re-eval performed. Pt reports an overall decline with increased freezing and "off" times. Pt practiced printing using adapted strategies with min v.c PWR! Up, rock and twist in standing, 10 -20 reps each, min v.c for safety and upright posture.              OT Short Term Goals - 04/07/19 1007      OT SHORT TERM GOAL #1   Title  I with PD specific HEP    Time  4    Period  Weeks    Status  New       OT SHORT TERM GOAL #2   Title  Pt will verbalize understanding of adapted strategies including strategies to address freezing episodes for ADLS/IADLS in order to maximize safety and independence.    Time  4    Status  On-going      OT SHORT TERM GOAL #3   Title  Pt will demonstrate improved independence with dressing as evidenced by decreasing 3 button/ unbutton time to 90 secs or less    Baseline  2 mins 16 secs    Status  On-going      OT SHORT TERM GOAL #4   Title  Pt/ wife will verbalize understanding of compensatory strategies for visual perceptual deficits.    Time  4    Period  Weeks    Status  On-going      OT SHORT TERM GOAL #5   Title  Pt will demonstrate ability to retrieve a lightweight object at 130 shoulder flexion with -10 elbow extension bilaterally.    Status  Achieved      OT SHORT TERM GOAL #6   Title  Pt will demonstrate improved RUE functional use as evidenced by increasing RUE box/ blocks score to 56 blocks or greater.    Baseline  RUE 53 blocks, LUE 60 blocks        OT Long Term Goals - 04/07/19 1015      OT LONG TERM GOAL #1   Title  Pt will verbalize understanding of community resources and ways to prevent future PD related complications.    Time  8    Period  Weeks    Status  On-going      OT LONG TERM GOAL #2   Title  Pt will verbalize understanding of compensatory strategies for short term memory deficits and ways to keep thinking skills sharp.    Time  8    Period  Weeks    Status  On-going      OT LONG TERM GOAL #3   Title  Pt will demonstrate ability to write a short paragraph with 100% legibility and minimal decrease in letter size    Time  8  Period  Weeks    Status  On-going      OT LONG TERM GOAL #4   Title  -------------------------------------            Plan - 04/07/19 1425    Clinical Impression Statement  Pt returns to occupational therapy for a re-eval today after a several month break from therapy due to  Covid-19. Pt reports an overall decline with increased freezing episodes. Pt can benefit from skilled occupational therapy to maximize pt's safety and indpendence with ADLs/ IADLS and to address the following deficits; decreased coordination, decreased balance, freezing, decreased timing, cogntive deficits, visual perceptual deficits,bradykinesia  and decreased functional mobility.    Occupational Profile and client history currently impacting functional performance  PMH: PD diagnosed in 2000, asthma. 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 Pt participates in PWR! moves    Occupational performance deficits (Please refer to evaluation for details):  IADL's;ADL's;Rest and Sleep;Leisure;Social Participation    Body Structure / Function / Physical Skills  ADL;Coordination;Endurance;GMC;Balance;Flexibility;Dexterity;FMC;ROM;Gait;Mobility    Rehab Potential  Good    OT Frequency  2x / week   pt requests 1x week initally   OT Duration  8 weeks    OT Treatment/Interventions  Self-care/ADL training;Therapeutic exercise;Visual/perceptual remediation/compensation;Patient/family education;Gait Training;Neuromuscular education;Moist Heat;Fluidtherapy;Energy conservation;Building services engineerunctional Mobility Training;Therapeutic activities;Cognitive remediation/compensation;Passive range of motion;Manual Therapy;DME and/or AE instruction;Ultrasound;Cryotherapy    Plan  update HEP, education regarding safety for ADLS/IADLs.    Consulted and Agree with Plan of Care  Patient       Patient will benefit from skilled therapeutic intervention in order to improve the following deficits and impairments:  Body Structure / Function / Physical Skills  Visit Diagnosis: Other symptoms and signs involving the nervous system - Plan: Ot plan of care cert/re-cert  Other lack of coordination - Plan: Ot plan of care cert/re-cert  Frontal lobe and executive function deficit - Plan: Ot plan of care cert/re-cert  Abnormal  posture - Plan: Ot plan of care cert/re-cert  Visuospatial deficit - Plan: Ot plan of care cert/re-cert  Unsteadiness on feet - Plan: Ot plan of care cert/re-cert  Other abnormalities of gait and mobility - Plan: Ot plan of care cert/re-cert  Other symptoms and signs involving the musculoskeletal system - Plan: Ot plan of care cert/re-cert    Problem List Patient Active Problem List   Diagnosis Date Noted  . Parkinson's disease (HCC) 05/04/2013    Annai Heick 04/07/2019, 2:46 PM Keene BreathKathryn Barbarann Kelly, OTR/L [phone] Phone: [phone] 2:46 PM 04/07/19 [facility] [address] [place], [place], [address] Phone: [phone]   Fax:  [phone]  Name: Zachary LahStuart D [name] MRN: [id number] Date of Birth: [birth year]

## 2019-04-14 ENCOUNTER — Other Ambulatory Visit: Payer: Self-pay

## 2019-04-14 ENCOUNTER — Ambulatory Visit: Payer: Medicare Other | Admitting: Occupational Therapy

## 2019-04-14 DIAGNOSIS — R29818 Other symptoms and signs involving the nervous system: Secondary | ICD-10-CM | POA: Diagnosis not present

## 2019-04-14 DIAGNOSIS — R29898 Other symptoms and signs involving the musculoskeletal system: Secondary | ICD-10-CM

## 2019-04-14 DIAGNOSIS — R278 Other lack of coordination: Secondary | ICD-10-CM

## 2019-04-14 DIAGNOSIS — R41842 Visuospatial deficit: Secondary | ICD-10-CM

## 2019-04-14 DIAGNOSIS — R41844 Frontal lobe and executive function deficit: Secondary | ICD-10-CM

## 2019-04-14 DIAGNOSIS — R2689 Other abnormalities of gait and mobility: Secondary | ICD-10-CM

## 2019-04-14 DIAGNOSIS — R293 Abnormal posture: Secondary | ICD-10-CM

## 2019-04-14 NOTE — Therapy (Signed)
[facility] [address] [place], [place], [address] Phone: [phone]   Fax:  [phone]  Occupational Therapy Treatment  Patient Details  Name: Zachary LahStuart D Matura MRN: [id number] Date of Birth: 09-08-1951 Referring Provider (OT): Dr. Arbutus Leasat   Encounter Date: 04/14/2019  OT End of Session - 04/14/19 0805    Visit Number  7   re-eval today   Number of Visits  22    Date for OT Re-Evaluation  06/06/19    Authorization Type  [employer]HC Medicare    Authorization Time Period  12 weeks, POC written for 8 weeks, pt requests therapy 1x week initally    OT Start Time  0804    OT Stop Time  0845    OT Time Calculation (min)  41 min    Activity Tolerance  Patient tolerated treatment well    Behavior During Therapy  [facility] for tasks assessed/performed       Past Medical History:  Diagnosis Date  . Allergy   . Asthma   . Parkinson's disease (HCC)    Congenital R fourth nerve palsy    Past Surgical History:  Procedure Laterality Date  . CATARACT EXTRACTION, BILATERAL    . COLONOSCOPY    . SHOULDER SURGERY Left    lymph nodes  . TONSILLECTOMY    . VASECTOMY      There were no vitals filed for this visit.  Subjective Assessment - 04/14/19 0804    Currently in Pain?  No/denies             CLINIC OPERATION CHANGES: Outpatient Neuro Rehab is open at lower capacity following universal masking, social distancing, and patient screening.  The patient's COVID risk of complications score is 3 PWR! up, rock and twist in supine, 10-20 reps each,  followed by PWR! rock in prone and quadraped, then PWR! twist in quadraped, min-mod v.c for larger amplitude movements, timing  and technique Closed chain Shoulder flexion and diagonals in supine 10 reps each, min v.c Coordination HEP issued, min-mod v.c for larger amplitude movements. Pt practiced sit-stand from a chair, and placing items in his pockets with big movements, min-mod v.c for  timing and posture.                OT Education - 04/14/19 [phone]    Education provided  Yes    Education Details  coordination HEP    Person(s) Educated  Patient    Methods  Explanation;Demonstration;Verbal cues;Handout    Comprehension  Verbalized understanding;Returned demonstration;Verbal cues required       OT Short Term Goals - 04/07/19 1007      OT SHORT TERM GOAL #1   Title  I with PD specific HEP    Time  4    Period  Weeks    Status  New      OT SHORT TERM GOAL #2   Title  Pt will verbalize understanding of adapted strategies including strategies to address freezing episodes for ADLS/IADLS in order to maximize safety and independence.    Time  4    Status  On-going      OT SHORT TERM GOAL #3   Title  Pt will demonstrate improved independence with dressing as evidenced by decreasing 3 button/ unbutton time to 90 secs or less    Baseline  2 mins 16 secs    Status  On-going      OT SHORT TERM GOAL #4   Title  Pt/ wife  will verbalize understanding of compensatory strategies for visual perceptual deficits.    Time  4    Period  Weeks    Status  On-going      OT SHORT TERM GOAL #5   Title  Pt will demonstrate ability to retrieve a lightweight object at 130 shoulder flexion with -10 elbow extension bilaterally.    Status  Achieved      OT SHORT TERM GOAL #6   Title  Pt will demonstrate improved RUE functional use as evidenced by increasing RUE box/ blocks score to 56 blocks or greater.    Baseline  RUE 53 blocks, LUE 60 blocks        OT Long Term Goals - 04/07/19 1015      OT LONG TERM GOAL #1   Title  Pt will verbalize understanding of community resources and ways to prevent future PD related complications.    Time  8    Period  Weeks    Status  On-going      OT LONG TERM GOAL #2   Title  Pt will verbalize understanding of compensatory strategies for short term memory deficits and ways to keep thinking skills sharp.    Time  8    Period  Weeks     Status  On-going      OT LONG TERM GOAL #3   Title  Pt will demonstrate ability to write a short paragraph with 100% legibility and minimal decrease in letter size    Time  8    Period  Weeks    Status  On-going      OT LONG TERM GOAL #4   Title  -------------------------------------            Plan - 04/14/19 7371    Clinical Impression Statement  Pt is progressing towards goals. He requires repetition and v.c . for timing during functional activities.    Body Structure / Function / Physical Skills  ADL;Endurance;UE functional use;Balance;Vision;Flexibility;FMC;ROM;Coordination;Gait;IADL;Decreased knowledge of use of DME;Decreased knowledge of precautions;Dexterity;Mobility;Tone;Strength;GMC    Cognitive Skills  Attention;Memory;Perception;Problem Solve;Safety Awareness    Rehab Potential  Good    OT Frequency  2x / week    OT Duration  8 weeks    OT Treatment/Interventions  Self-care/ADL training;Visual/perceptual remediation/compensation;Aquatic Therapy;DME and/or AE instruction;Patient/family education;Passive range of motion;Balance training;Fluidtherapy;Functional Mobility Training;Splinting;Therapeutic exercise;Moist Heat;Manual Therapy;Therapeutic activities;Cognitive remediation/compensation;Neuromuscular education;Energy conservation    Plan  ADL strategies    Consulted and Agree with Plan of Care  Patient       Patient will benefit from skilled therapeutic intervention in order to improve the following deficits and impairments:   Body Structure / Function / Physical Skills: ADL, Endurance, UE functional use, Balance, Vision, Flexibility, FMC, ROM, Coordination, Gait, IADL, Decreased knowledge of use of DME, Decreased knowledge of precautions, Dexterity, Mobility, Tone, Strength, GMC Cognitive Skills: Attention, Memory, Perception, Problem Solve, Safety Awareness     Visit Diagnosis: 1. Other lack of coordination   2. Abnormal posture   3. Visuospatial deficit    4. Frontal lobe and executive function deficit   5. Other symptoms and signs involving the musculoskeletal system   6. Other abnormalities of gait and mobility       Problem List Patient Active Problem List   Diagnosis Date Noted  . Parkinson's disease ([place]) 05/04/2013    [name],[name] 04/14/2019, 8:39 AM  [facility] [address] [place], [place], [address] Phone: [phone]   Fax:  [phone]  Name: Zachary LahStuart D Kilian MRN: [id number] Date of Birth: 1951/05/31

## 2019-04-14 NOTE — Patient Instructions (Signed)
Coordination Exercises  Perform the following exercises for 20 minutes 1 times per day. Perform with both hand(s). Perform using big movements.   Flipping Cards: Place deck of cards on the table. Flip cards over by opening your hand big to grasp and then turn your palm up big.  Deal cards: Hold 1/2 or whole deck in your hand. Use thumb to push card off top of deck with one big push.  Flip card between each finger.  Rotate ball with fingertips: Pick up with fingers/thumb and move as much as you can with each turn/movement (clockwise and counter-clockwise).  Pick up coins and stack one at a time: Pick up with big, intentional movements. Do not drag coin to the edge. (5-10 in a stack)  Practice writing: Slow down, write big, and focus on forming each letter.

## 2019-04-21 ENCOUNTER — Other Ambulatory Visit: Payer: Self-pay

## 2019-04-21 ENCOUNTER — Ambulatory Visit: Payer: Medicare Other | Admitting: Occupational Therapy

## 2019-04-21 DIAGNOSIS — R29818 Other symptoms and signs involving the nervous system: Secondary | ICD-10-CM | POA: Diagnosis not present

## 2019-04-21 DIAGNOSIS — R41842 Visuospatial deficit: Secondary | ICD-10-CM

## 2019-04-21 DIAGNOSIS — R278 Other lack of coordination: Secondary | ICD-10-CM

## 2019-04-21 DIAGNOSIS — R29898 Other symptoms and signs involving the musculoskeletal system: Secondary | ICD-10-CM

## 2019-04-21 DIAGNOSIS — R41844 Frontal lobe and executive function deficit: Secondary | ICD-10-CM

## 2019-04-21 DIAGNOSIS — R293 Abnormal posture: Secondary | ICD-10-CM

## 2019-04-21 NOTE — Therapy (Signed)
[facility] [address] [place], [place], [address] Phone: [phone]   Fax:  [phone]  Occupational Therapy Treatment  Patient Details  Name: Zachary LahStuart D [name] MRN: [id number] Date of Birth: December 09, 1950 Referring Provider (OT): Dr. Arbutus Leasat   Encounter Date: 04/21/2019  OT End of Session - 04/21/19 0830    Visit Number  8   re-eval today   Number of Visits  22    Date for OT Re-Evaluation  06/06/19    Authorization Type  [employer] Medicare    Authorization Time Period  12 weeks, POC written for 8 weeks, pt requests therapy 1x week initally    OT Start Time  0805    OT Stop Time  0845    OT Time Calculation (min)  40 min    Activity Tolerance  Patient tolerated treatment well    Behavior During Therapy  [facility] for tasks assessed/performed       Past Medical History:  Diagnosis Date  . Allergy   . Asthma   . Parkinson's disease (HCC)    Congenital R fourth nerve palsy    Past Surgical History:  Procedure Laterality Date  . CATARACT EXTRACTION, BILATERAL    . COLONOSCOPY    . SHOULDER SURGERY Left    lymph nodes  . TONSILLECTOMY    . VASECTOMY      There were no vitals filed for this visit.  Subjective Assessment - 04/21/19 0830    Subjective   Pt denies pain    Currently in Pain?  No/denies              CLINIC OPERATION CHANGES: Outpatient Neuro Rehab is open at lower capacity following universal masking, social distancing, and patient screening.  The patient's COVID risk of complications score is 3 PWR! up, rock and twist in seated 10-20 reps each, min v.c for larger amplitude movements, timing  and technique Coordination HEP reviewed min-mod v.c for larger amplitude movements. Pt practiced sit-stand from a chair, min-mod v.c for timing and posture. Arm bike x 6 mins level 3 for conditioning Pt was provided with education regarding strategies to address freezing.                OT Short  Term Goals - 04/07/19 1007      OT SHORT TERM GOAL #1   Title  I with PD specific HEP    Time  4    Period  Weeks    Status  New      OT SHORT TERM GOAL #2   Title  Pt will verbalize understanding of adapted strategies including strategies to address freezing episodes for ADLS/IADLS in order to maximize safety and independence.    Time  4    Status  On-going      OT SHORT TERM GOAL #3   Title  Pt will demonstrate improved independence with dressing as evidenced by decreasing 3 button/ unbutton time to 90 secs or less    Baseline  2 mins 16 secs    Status  On-going      OT SHORT TERM GOAL #4   Title  Pt/ wife will verbalize understanding of compensatory strategies for visual perceptual deficits.    Time  4    Period  Weeks    Status  On-going      OT SHORT TERM GOAL #5   Title  Pt will demonstrate ability to retrieve a lightweight object at 130 shoulder flexion with -10  elbow extension bilaterally.    Status  Achieved      OT SHORT TERM GOAL #6   Title  Pt will demonstrate improved RUE functional use as evidenced by increasing RUE box/ blocks score to 56 blocks or greater.    Baseline  RUE 53 blocks, LUE 60 blocks        OT Long Term Goals - 04/07/19 1015      OT LONG TERM GOAL #1   Title  Pt will verbalize understanding of community resources and ways to prevent future PD related complications.    Time  8    Period  Weeks    Status  On-going      OT LONG TERM GOAL #2   Title  Pt will verbalize understanding of compensatory strategies for short term memory deficits and ways to keep thinking skills sharp.    Time  8    Period  Weeks    Status  On-going      OT LONG TERM GOAL #3   Title  Pt will demonstrate ability to write a short paragraph with 100% legibility and minimal decrease in letter size    Time  8    Period  Weeks    Status  On-going      OT LONG TERM GOAL #4   Title  -------------------------------------              Patient will benefit  from skilled therapeutic intervention in order to improve the following deficits and impairments:           Visit Diagnosis: 1. Other lack of coordination   2. Abnormal posture   3. Visuospatial deficit   4. Frontal lobe and executive function deficit   5. Other symptoms and signs involving the musculoskeletal system       Problem List Patient Active Problem List   Diagnosis Date Noted  . Parkinson's disease ([place]) 05/04/2013    Zachary [name] 04/21/2019, 8:46 AM  [facility] [address] [place], [place], [address] Phone: [phone]   Fax:  [phone]  Name: Zachary [name] MRN: [id number] Date of Birth: 03-Aug-1951

## 2019-04-28 ENCOUNTER — Ambulatory Visit: Payer: Medicare Other | Admitting: Occupational Therapy

## 2019-04-28 ENCOUNTER — Ambulatory Visit: Payer: Medicare Other | Admitting: Physical Therapy

## 2019-04-29 DIAGNOSIS — H9190 Unspecified hearing loss, unspecified ear: Secondary | ICD-10-CM

## 2019-04-29 HISTORY — DX: Unspecified hearing loss, unspecified ear: H91.90

## 2019-05-04 ENCOUNTER — Telehealth: Payer: Self-pay | Admitting: Neurology

## 2019-05-04 DIAGNOSIS — G20 Parkinson's disease: Secondary | ICD-10-CM

## 2019-05-04 MED ORDER — CARBIDOPA-LEVODOPA ER 50-200 MG PO TBCR: 1.0000 | EXTENDED_RELEASE_TABLET | Freq: Every day | ORAL | 1 refills | Status: DC

## 2019-05-04 NOTE — Telephone Encounter (Signed)
Patient needs a refill on the medication carbidopa levodopa  Called into the [facility]. He states that he take 9 pills a day

## 2019-05-04 NOTE — Telephone Encounter (Signed)
Requested Prescriptions   Pending Prescriptions Disp Refills  . carbidopa-levodopa (SINEMET CR) 50-200 MG tablet 90 tablet 1    Sig: Take 1 tablet by mouth at bedtime.   Rx last filled:02/26/19 #90 1 refills  Pt last seen:01/20/19  Follow up appt scheduled:06/22/19

## 2019-05-05 ENCOUNTER — Other Ambulatory Visit: Payer: Self-pay

## 2019-05-05 ENCOUNTER — Ambulatory Visit: Payer: Medicare Other | Attending: Neurology | Admitting: Occupational Therapy

## 2019-05-05 DIAGNOSIS — R29818 Other symptoms and signs involving the nervous system: Secondary | ICD-10-CM | POA: Diagnosis present

## 2019-05-05 DIAGNOSIS — R2681 Unsteadiness on feet: Secondary | ICD-10-CM | POA: Diagnosis present

## 2019-05-05 DIAGNOSIS — R278 Other lack of coordination: Secondary | ICD-10-CM | POA: Diagnosis present

## 2019-05-05 DIAGNOSIS — R2689 Other abnormalities of gait and mobility: Secondary | ICD-10-CM | POA: Diagnosis present

## 2019-05-05 DIAGNOSIS — R41842 Visuospatial deficit: Secondary | ICD-10-CM | POA: Diagnosis present

## 2019-05-05 DIAGNOSIS — R293 Abnormal posture: Secondary | ICD-10-CM | POA: Insufficient documentation

## 2019-05-05 DIAGNOSIS — Z9181 History of falling: Secondary | ICD-10-CM | POA: Insufficient documentation

## 2019-05-05 DIAGNOSIS — R41844 Frontal lobe and executive function deficit: Secondary | ICD-10-CM | POA: Diagnosis present

## 2019-05-05 DIAGNOSIS — R29898 Other symptoms and signs involving the musculoskeletal system: Secondary | ICD-10-CM

## 2019-05-05 NOTE — Patient Instructions (Signed)
Memory Compensation Strategies  1. Use "WARM" strategy.  W= write it down  A= associate it  R= repeat it  M= make a mental note  2.   You can keep a Memory Notebook.  Use a 3-ring notebook with sections for the following: calendar, important names and phone numbers,  medications, doctors' names/phone numbers, lists/reminders, and a section to journal what you did  each day.   3.    Use a calendar to write appointments down.  4.    Write yourself a schedule for the day.  This can be placed on the calendar or in a separate section of the Memory Notebook.  Keeping a  regular schedule can help memory.  5.    Use medication organizer with sections for each day or morning/evening pills.  You may need help loading it  6.    Keep a basket, or pegboard by the door.  Place items that you need to take out with you in the basket or on the pegboard.  You may also want to  include a message board for reminders.  7.    Use sticky notes.  Place sticky notes with reminders in a place where the task is performed.  For example: " turn off the  stove" placed by the stove, "lock the door" placed on the door at eye level, " take your medications" on  the bathroom mirror or by the place where you normally take your medications.  8.    Use alarms/timers.  Use while cooking to remind yourself to check on food or as a reminder to take your medicine, or as a  reminder to make a call, or as a reminder to perform another task, etc.   Keeping Thinking Skills Sharp: 1. Jigsaw puzzles 2. Card/board games 3. Talking on the phone/social events 4. Lum[employer].[email or web address] 5. Online games 6. Word searches/crossword puzzles 7.  Logic puzzles 8. Aerobic exercise (stationary bike) 9. Eating balanced diet (fruits & veggies) 10. Drink water 11. Try something new--new recipe, hobby 12. Crafts 13. Do a variety of activities that are challenging  

## 2019-05-05 NOTE — Therapy (Signed)
[facility] [address] [place], [place], [address] Phone: [phone]   Fax:  [phone]  Occupational Therapy Treatment  Patient Details  Name: Zachary [name] MRN: [id number] Date of Birth: [birth year] Referring Provider (OT): Dr. Carles Collet   Encounter Date: 05/05/2019  OT End of Session - 05/05/19 1056    Visit Number  9      Number of Visits  22    Date for OT Re-Evaluation  06/06/19    Authorization Type  [employer] Medicare    Authorization Time Period  12 weeks, POC written for 8 weeks, pt requests therapy 1x week initally    Authorization - Visit Number  9    Authorization - Number of Visits  10    OT Start Time  4431    OT Stop Time  1045    OT Time Calculation (min)  43 min    Activity Tolerance  Patient tolerated treatment well    Behavior During Therapy  WFL for tasks assessed/performed       Past Medical History:  Diagnosis Date  . Allergy   . Asthma   . Parkinson's disease ([place])    Congenital R fourth nerve palsy    Past Surgical History:  Procedure Laterality Date  . CATARACT EXTRACTION, BILATERAL    . COLONOSCOPY    . SHOULDER SURGERY Left    lymph nodes  . TONSILLECTOMY    . VASECTOMY      There were no vitals filed for this visit.              LINIC OPERATION CHANGES: Outpatient Neuro Rehab is open at lower capacity following universal masking, social distancing, and patient screening.  The patient's COVID risk of complications score is 3 PWR! up, rock  in seated 10 reps each, then PWR! quadraped rock and twist min v.c for larger amplitude movements, timing  and technique. Handwriting activities, min v.c then pt returned demonstration of ability to write a paragraph with only min decreased in letter size. Pt practiced sit-stand from a chair, min-mod v.c for timing and posture. Arm bike x 5 mins level 3 for conditioning, pt maintained 38-50 rpm          OT Education -  05/05/19 1046    Education provided  Yes    Education Details  keeping thinking skills sharp, and memory compensation skills    Person(s) Educated  Patient    Methods  Explanation;Demonstration;Verbal cues;Handout    Comprehension  Verbalized understanding;Returned demonstration;Verbal cues required       OT Short Term Goals - 05/05/19 1058      OT SHORT TERM GOAL #1   Title  I with PD specific HEP    Time  4    Period  Weeks    Status  On-going      OT SHORT TERM GOAL #2   Title  Pt will verbalize understanding of adapted strategies including strategies to address freezing episodes for ADLS/IADLS in order to maximize safety and independence.    Time  4    Status  On-going      OT SHORT TERM GOAL #3   Title  Pt will demonstrate improved independence with dressing as evidenced by decreasing 3 button/ unbutton time to 90 secs or less    Baseline  2 mins 16 secs    Status  On-going      OT SHORT TERM GOAL #4   Title  Pt/ wife will  verbalize understanding of compensatory strategies for visual perceptual deficits.    Time  4    Period  Weeks    Status  On-going      OT SHORT TERM GOAL #5   Title  Pt will demonstrate ability to retrieve a lightweight object at 130 shoulder flexion with -10 elbow extension bilaterally.    Status  Achieved      OT SHORT TERM GOAL #6   Title  Pt will demonstrate improved RUE functional use as evidenced by increasing RUE box/ blocks score to 56 blocks or greater.    Baseline  RUE 53 blocks, LUE 60 blocks    Status  On-going        OT Long Term Goals - 05/05/19 1058      OT LONG TERM GOAL #1   Title  Pt will verbalize understanding of community resources and ways to prevent future PD related complications.    Time  8    Period  Weeks    Status  On-going      OT LONG TERM GOAL #2   Title  Pt will verbalize understanding of compensatory strategies for short term memory deficits and ways to keep thinking skills sharp.    Time  8    Period   Weeks    Status  Achieved      OT LONG TERM GOAL #3   Title  Pt will demonstrate ability to write a short paragraph with 100% legibility and minimal decrease in letter size    Time  8    Period  Weeks    Status  Achieved      OT LONG TERM GOAL #4   Title  -------------------------------------            Plan - 05/05/19 1057    Clinical Impression Statement  Pt is progressing slowly towards goals. He remains limited by freezing episodes, decreased timing and cognitve deficits.    Occupational performance deficits (Please refer to evaluation for details):  ADL's;IADL's;Rest and Sleep;Leisure;Social Participation    Body Structure / Function / Physical Skills  ADL;Endurance;UE functional use;Balance;Vision;Flexibility;FMC;ROM;Coordination;Gait;IADL;Decreased knowledge of use of DME;Decreased knowledge of precautions;Dexterity;Mobility;Tone;Strength;GMC    Cognitive Skills  Attention;Memory;Perception;Problem Solve;Safety Awareness    Rehab Potential  Good    OT Frequency  2x / week    OT Duration  8 weeks    OT Treatment/Interventions  Self-care/ADL training;Visual/perceptual remediation/compensation;Aquatic Therapy;DME and/or AE instruction;Patient/family education;Passive range of motion;Balance training;Fluidtherapy;Functional Mobility Training;Splinting;Therapeutic exercise;Moist Heat;Manual Therapy;Therapeutic activities;Cognitive remediation/compensation;Neuromuscular education;Energy conservation    Plan  ADL strategies, PWR! moves    Consulted and Agree with Plan of Care  Patient       Patient will benefit from skilled therapeutic intervention in order to improve the following deficits and impairments:   Body Structure / Function / Physical Skills: ADL, Endurance, UE functional use, Balance, Vision, Flexibility, FMC, ROM, Coordination, Gait, IADL, Decreased knowledge of use of DME, Decreased knowledge of precautions, Dexterity, Mobility, Tone, Strength, GMC Cognitive Skills:  Attention, Memory, Perception, Problem Solve, Safety Awareness     Visit Diagnosis: 1. Other lack of coordination   2. Visuospatial deficit   3. Frontal lobe and executive function deficit   4. Other symptoms and signs involving the musculoskeletal system   5. Abnormal posture       Problem List Patient Active Problem List   Diagnosis Date Noted  . Parkinson's disease (HCC) 05/04/2013    , 05/05/2019, 10:59 AM  [facility]  [address] [place], [place], [address] Phone: [phone]   Fax:  [phone]  Name: Gustavo LahStuart D Donatelli MRN: [id number] Date of Birth: 10-28-1951

## 2019-05-07 ENCOUNTER — Ambulatory Visit: Payer: Medicare Other | Admitting: Physical Therapy

## 2019-05-07 ENCOUNTER — Encounter: Payer: Self-pay | Admitting: Physical Therapy

## 2019-05-07 ENCOUNTER — Other Ambulatory Visit: Payer: Self-pay

## 2019-05-07 DIAGNOSIS — R278 Other lack of coordination: Secondary | ICD-10-CM | POA: Diagnosis not present

## 2019-05-07 DIAGNOSIS — R2689 Other abnormalities of gait and mobility: Secondary | ICD-10-CM

## 2019-05-07 DIAGNOSIS — R29818 Other symptoms and signs involving the nervous system: Secondary | ICD-10-CM

## 2019-05-07 DIAGNOSIS — R2681 Unsteadiness on feet: Secondary | ICD-10-CM

## 2019-05-07 DIAGNOSIS — R293 Abnormal posture: Secondary | ICD-10-CM

## 2019-05-07 DIAGNOSIS — Z9181 History of falling: Secondary | ICD-10-CM

## 2019-05-07 NOTE — Therapy (Signed)
[facility] [address] [place], [place], [address] Phone: [phone]   Fax:  [phone]  Physical Therapy Evaluation  Patient Details  Name: Zachary [name] MRN: [id number] Date of Birth: 09/15/1951 Referring Provider (PT): Dr. Arbutus Leas  CLINIC OPERATION CHANGES: Outpatient Neuro Rehab is open at lower capacity following universal masking, social distancing, and patient screening.  The patient's COVID risk of complications score is 3.   Encounter Date: 05/07/2019  PT End of Session - 05/07/19 1151    Visit Number  1    Number of Visits  13    Date for PT Re-Evaluation  07/06/19    Authorization Type  [employer] Medicare; 10th visit PN    PT Start Time  1001    PT Stop Time  1050    PT Time Calculation (min)  49 min    Equipment Utilized During Treatment  Gait belt    Activity Tolerance  Patient tolerated treatment well    Behavior During Therapy  WFL for tasks assessed/performed       Past Medical History:  Diagnosis Date  . Allergy   . Asthma   . Parkinson's disease (HCC)    Congenital R fourth nerve palsy    Past Surgical History:  Procedure Laterality Date  . CATARACT EXTRACTION, BILATERAL    . COLONOSCOPY    . SHOULDER SURGERY Left    lymph nodes  . TONSILLECTOMY    . VASECTOMY      There were no vitals filed for this visit.   Subjective Assessment - 05/07/19 1004    Subjective  Not being able to go to the gym "sucks".  I am able to go to [place] [facility] [employer] 2x/wk for one-on-one workouts.  Has had 2 falls in the past 6 months; one fall was going down the steps and other fall was standing after getting up.  Having probably a little more freezing spells (typically sometimes mid-morning)    Pertinent History  Parkinsons Disease    Patient Stated Goals  Helping with the balance, shuffling.    Currently in Pain?  No/denies         [facility] PT Assessment - 05/07/19 1011      Assessment   Medical Diagnosis   Parkinson's disease    Referring Provider (PT)  Dr. Arbutus Leas    Onset Date/Surgical Date  04/07/19   New order from MD   Hand Dominance  Right      Precautions   Precautions  Fall    Precaution Comments  Not driving      Balance Screen   Has the patient fallen in the past 6 months  Yes    How many times?  2   at least, one fall was going down stairs   Has the patient had a decrease in activity level because of a fear of falling?   Yes   not golfing as much   Is the patient reluctant to leave their home because of a fear of falling?   No      Home Public house manager residence    Living Arrangements  Spouse/significant other    Available Help at Discharge  Family    Type of Home  House    Home Access  Stairs to enter    Entrance Stairs-Number of Steps  4    Entrance Stairs-Rails  Right    Home Layout  Two level;Able to live on main level  with bedroom/bathroom   Office is upstairs   Alternate Level Stairs-Number of Steps  Normal staircase    Alternate Level Stairs-Rails  Can reach both    Home Equipment  Transport chair;Shower seat;Cane - single point;Walker - 4 wheels      Prior Function   Level of Independence  Needs assistance with ADLs;Other (comment)   Daughter helps w/ cooking and food prep   Vocation  Retired    Leisure  Enjoys golf, going to exercise classes at [employer]    Comments  Has been going to [place] at [employer] 2x week for the past 4 weeks       Posture/Postural Control   Posture/Postural Control  Postural limitations    Postural Limitations  Rounded Shoulders;Flexed trunk    Posture Comments  Patient tends to keep forwardly flexed posture during gait in mildy crouched position.      Tone   Assessment Location  Right Lower Extremity;Left Lower Extremity      ROM / Strength   AROM / PROM / Strength  Strength      Strength   Overall Strength Comments  5/5 hip flex B, 5/5 ankle DF B, L knee flexion 4/5, R knee flexion 4+/5,knee ext 4+/5 B       Transfers   Transfers  Sit to Stand;Stand to Sit    Sit to Stand  6: Modified independent (Device/Increase time);5: Supervision;With armrests    Sit to Stand Details  Verbal cues for sequencing;Verbal cues for precautions/safety    Sit to Stand Details (indicate cue type and reason)  when coming to stand will initially brace LE against chair. Before performing 5x sit <> stand pt had an episode of freezing, with cueing to rock, pt able to get out of freezing episode and come to stand     Five time sit to stand comments   15.03 seconds with UE support, from 18" chair.       Ambulation/Gait   Ambulation/Gait  Yes    Ambulation/Gait Assistance  4: Min guard;5: Supervision    Ambulation/Gait Assistance Details  Clinic distances for outcome measures, 125' ambulating with six point cane. While walking into clinic patient ambulates with six point cane and holds it off the ground, only placing it on ground during episodes of freezing. PT advised pt to use cane throughout gait cycle and proper mechanics. Pt has difficulty keeping the cane with contralateral foot when increasing his cadence. Cues for bigger steps and slower movement. Pt has tendency to freeze when increasing his cadence and shuffling his steps.    Assistive device  None;Straight cane   SPC with 6 prongs, pt uses it for freezing   Gait Pattern  Step-through pattern;Decreased arm swing - left;Decreased arm swing - right;Decreased stride length;Shuffle;Festinating;Trunk flexed;Narrow base of support    Ambulation Surface  Level;Indoor    Gait velocity  11 secs = 2.98 ft/sec    Ramp  5: Supervision;4: Min assist    Ramp Details (indicate cue type and reason)  Min A when descending ramp, pt takes smaller steps/ more cautious       Standardized Balance Assessment   Standardized Balance Assessment  Five Times Sit to Stand    Five times sit to stand comments   15.03 seconds with UE support, from 18" chair.    Not able to perform without UE  support     Timed Up and Go Test   Normal TUG (seconds)  11.88    Manual TUG (  seconds)  16.15   increased shuffling   Cognitive TUG (seconds)  13.68   increased shuffling, 1 episode of freezing   TUG Comments  >10% difference with TUG and TUG cognitive/manual, indicating difficulty with dual tasking      High Level Balance   High Level Balance Comments  MiniBest: 16/28   high risk of falling     RLE Tone   RLE Tone  Mild   Mild stiffness, rigidity L>R     LLE Tone   LLE Tone  Mild   Mild stiffness, rigidity L>R        Mini-BESTest: Balance Evaluation Systems Test  2005-2013 [facility] [employer]. All rights reserved.  Anticipatory  Sub Score     4/6 ______________________________________________________________________ 1. SIT TO STAND Instruction: "Cross your arms across your chest. Try not to use your hands unless you must. Do not let your legs lean against the back of the chair when you stand. Please stand up now." (2) Normal: Comes to stand without use of hands and stabilizes independently. (1) Moderate: Comes to stand WITH use of hands on first attempt. (0) Severe: Unable to stand up from chair without assistance, OR needs several attempts with use of hands.  2. RISE TO TOES Instruction: "Place your feet shoulder width apart. Place your hands on your hips. Try to rise as high as you can onto your toes. I will count out loud to 3 seconds. Try to hold this pose for at least 3 seconds. Look straight ahead. Rise now." (2) Normal: Stable for 3 s with maximum height. (1) Moderate: Heels up, but not full range (smaller than when holding hands), OR noticeable instability for 3 s. (0) Severe: < 3 s.  3. STAND ON ONE LEG Instruction: "Look straight ahead. Keep your hands on your hips. Lift your leg off of the ground behind you without touching or resting your raised leg upon your other standing leg. Stay standing on one leg as long as you can. Look straight ahead.  Lift now." Left: Time in Seconds Trial 1:_7____Trial 2:_3____ (2) Normal: 20 s. (1) Moderate: < 20 s. (0) Severe: Unable. Right: Time in Seconds Trial 1:__15___Trial 2:__13___ (2) Normal: 20 s. (1) Moderate: < 20 s. (0) Severe: Unable To score each side separately use the trial with the longest time. To calculate the sub-score and total score use the side [left or right] with the lowest numerical score [i.e. the worse side].  Reactive Postural Control   Sub Score     3/6 4. COMPENSATORY STEPPING CORRECTION- FORWARD Instruction: "Stand with your feet shoulder width apart, arms at your sides. Lean forward against my hands beyond your forward limits. When I let go, do whatever is necessary, including taking a step, to avoid a fall." (2) Normal: Recovers independently with a single, large step (second realignment step is allowed). (1) Moderate: More than one step used to recover equilibrium. (0) Severe: No step, OR would fall if not caught, OR falls spontaneously.  5. COMPENSATORY STEPPING CORRECTION- BACKWARD Instruction: "Stand with your feet shoulder width apart, arms at your sides. Lean backward against my hands beyond your backward limits. When I let go, do whatever is necessary, including taking a step, to avoid a fall." (2) Normal: Recovers independently with a single, large step. (1) Moderate: More than one step used to recover equilibrium. (0) Severe: No step, OR would fall if not caught, OR falls spontaneously.  6. COMPENSATORY STEPPING CORRECTION- LATERAL Instruction: "Stand with your feet  together, arms down at your sides. Lean into my hand beyond your sideways limit. When I let go, do whatever is necessary, including taking a step, to avoid a fall." Left (2) Normal: Recovers independently with 1 step (crossover or lateral OK). (1) Moderate: Several steps to recover equilibrium. (0) Severe: Falls, or cannot step. Right (2) Normal: Recovers independently with 1  step (crossover or lateral OK). (1) Moderate: Several steps to recover equilibrium. (0) Severe: Falls, or cannot step. Use the side with the lowest score to calculate sub-score and total score.  Sensory Orientation  Sub Score   4/6 ______________________________________________________________________ 7. STANCE (FEET TOGETHER); EYES OPEN, FIRM SURFACE Instruction: "Place your hands on your hips. Place your feet together until almost touching. Look straight ahead. Be as stable and still as possible, until I say stop." Time in seconds:________ (2) Normal: 30 s. (1) Moderate: < 30 s. (0) Severe: Unable.  8. STANCE (FEET TOGETHER); EYES CLOSED, FOAM SURFACE Instruction: "Step onto the foam. Place your hands on your hips. Place your feet together until almost touching. Be as stable and still as possible, until I say stop. I will start timing when you close your eyes." Time in seconds:___27_____ (2) Normal: 30 s. (1) Moderate: < 30 s. (0) Severe: Unable.  9. INCLINE- EYES CLOSED Instruction: "Step onto the incline ramp. Please stand on the incline ramp with your toes toward the top. Place your feet shoulder width apart and have your arms down at your sides. I will start timing when you close your eyes." Time in seconds:________ (2) Normal: Stands independently 30 s and aligns with gravity. (1) Moderate: Stands independently <30 s OR aligns with surface. (0) Severe: Unable. ______________________________________________________________________  10. CHANGE IN GAIT SPEED  DYNAMIC GAIT SUB SCORE:   5/ 10 Instruction: "Begin walking at your normal speed, when I tell you 'fast', walk as fast as you can. When I say 'slow', walk very slowly." (2) Normal: Significantly changes walking speed without imbalance. (1) Moderate: Unable to change walking speed or signs of imbalance. (0) Severe: Unable to achieve significant change in walking speed AND signs of imbalance.  [place] -  HORIZONTAL Instruction: "Begin walking at your normal speed, when I say "right", turn your head and look to the right. When I say "left" turn your head and look to the left. Try to keep yourself walking in a straight line." (2) Normal: performs head turns with no change in gait speed and good balance. (1) Moderate: performs head turns with reduction in gait speed. (0) Severe: performs head turns with imbalance.  12. WALK WITH PIVOT TURNS Instruction: "Begin walking at your normal speed. When I tell you to 'turn and stop', turn as quickly as you can, face the opposite direction, and stop. After the turn, your feet should be close together." (2) Normal: Turns with feet close FAST (< 3 steps) with good balance. (1) Moderate: Turns with feet close SLOW (>4 steps) with good balance. (0) Severe: Cannot turn with feet close at any speed without imbalance.  13. STEP OVER OBSTACLES Instruction: "Begin walking at your normal speed. When you get to the box, step over it, not around it and keep walking." (2) Normal: Able to step over box with minimal change of gait speed and with good balance. (1) Moderate: Steps over box but touches box OR displays cautious behavior by slowing gait. (0) Severe: Unable to step over box OR steps around box.  14. TIMED UP & GO WITH DUAL  TASK [3 METER WALK] Instruction TUG: "When I say 'Go', stand up from chair, walk at your normal speed across the tape on the floor, turn around, and come back to sit in the chair." Instruction TUG with Dual Task: "Count backwards by threes starting at ___. When I say 'Go', stand up from chair, walk at your normal speed across the tape on the floor, turn around, and come back to sit in the chair. Continue counting backwards the entire time." TUG: ___11.88____seconds; Dual Task TUG: __13.68______seconds (2) Normal: No noticeable change in sitting, standing or walking while backward counting when compared to TUG without Dual Task. (1)  Moderate: Dual Task affects either counting OR walking (>10%) when compared to the TUG without Dual Task. (0) Severe: Stops counting while walking OR stops walking while counting. When scoring item 14, if subject's gait speed slows more than 10% between the TUG without and with a Dual Task the score should be decreased by a point.  TOTAL SCORE: ____16____/28          Objective measurements completed on examination: See above findings.              PT Education - 05/07/19 1147    Education provided  Yes    Education Details  Clinical findings, POC, taught patient correct gait mechanics while using cane with six point tip    Person(s) Educated  Patient    Methods  Explanation;Demonstration    Comprehension  Verbalized understanding;Returned demonstration;Need further instruction       PT Short Term Goals - 05/07/19 1431      PT SHORT TERM GOAL #1   Title  Pt will perform HEP with family's supervision, to address pt's Parkinson's related deficits. ALL TARGET DATES 05/28/2019    Time  3    Period  Weeks    Status  New    Target Date  05/28/19      PT SHORT TERM GOAL #2   Title  Pt will decrease 5 times sit <> stand to at least 13.5 seconds with no UE support without any posterior lean.    Baseline  15.03 seconds    Time  3    Period  Weeks    Status  New      PT SHORT TERM GOAL #3   Title  Pt/wife will verbalize and demonstrate tips to reduce freezing episodes with gait and turns.    Time  3    Period  Weeks    Status  New      PT SHORT TERM GOAL #4   Title  Pt will increase miniBEST to at least a 18/28 in order to decrease risk of future falls.    Baseline  16/28 on 05/07/19    Time  3    Period  Weeks    Status  New        PT Long Term Goals - 05/07/19 1434      PT LONG TERM GOAL #1   Title  Pt/wife will verbalize understanding of fall prevention in home environment.  ALL LTGS 06/24/19    Time  6    Period  Weeks    Status  New    Target Date   06/24/19      PT LONG TERM GOAL #2   Title  Pt will perform HEP with wife's supervision for improved balance, gait, posture.    Time  6    Period  Weeks    Status  New      PT LONG TERM GOAL #3   Title  Pt will improve MiniBESTest score to at least 21/28 for decreased fall risk.    Baseline  16/28 on 05/07/19    Time  6    Period  Weeks    Status  New      PT LONG TERM GOAL #4   Title  Pt will improve TUG and TUG manual to <10% difference, for improved ability for dual tasking.    Baseline  normal TUG = 11.88 seconds, manual TUG = 16.15 seconds on 05/07/19    Time  6    Period  Weeks    Status  New      PT LONG TERM GOAL #5   Title  Patient will demonstrate proper gait mechanics using LRAD with no verbal cueing in order to increase safety ambulating at home and in the community.    Baseline  cueing for cane placement and gait mechanics on 05/07/19    Time  6    Period  Weeks    Status  New      Additional Long Term Goals   Additional Long Term Goals  Yes             Plan - 05/07/19 1158    Clinical Impression Statement  Patient is a 68 year old male with hx of Parkinsons Disease referred to Neuro OPPT for re-evaluation with primary concern of falling and shuffling during gait.The following deficits were present during the exam: shuffling gait, imbalance, festination and freezing with episodes of increase cadence, abnormal posture, and decreased coordination of gait. Pt's gait speed, miniBEST, and TUG scores indicate pt is at a high risk for falls. Pt would benefit from skilled PT to address these impairments and functional limitations to maximize functional mobility independence and safety.    Personal Factors and Comorbidities  Comorbidity 1;Past/Current Experience;Behavior Pattern    Comorbidities  PD    Examination-Activity Limitations  Stand;Stairs;Squat;Transfers;Locomotion Level    Stability/Clinical Decision Making  Evolving/Moderate complexity    Clinical Decision  Making  Moderate    Rehab Potential  Fair    Clinical Impairments Affecting Rehab Potential  impaired cognition    PT Frequency  2x / week    PT Duration  6 weeks    PT Treatment/Interventions  ADLs/Self Care Home Management;DME Instruction;Gait training;Stair training;Functional mobility training;Therapeutic activities;Therapeutic exercise;Balance training;Patient/family education;Neuromuscular re-education    PT Next Visit Plan  Gait training with 6 prong cane. Discuss ways to decrease episodes of freezing (with wife present), PWR exercises for upright posture, stepping strategies. Initial HEP for balance    Consulted and Agree with Plan of Care  Patient       Patient will benefit from skilled therapeutic intervention in order to improve the following deficits and impairments:  Abnormal gait, Decreased activity tolerance, Decreased balance, Decreased coordination, Decreased mobility, Decreased safety awareness, Difficulty walking, Decreased strength, Impaired tone, Postural dysfunction, Decreased range of motion, Decreased cognition  Visit Diagnosis: 1. Other lack of coordination   2. Abnormal posture   3. Other abnormalities of gait and mobility   4. Other symptoms and signs involving the nervous system   5. Unsteadiness on feet   6. History of falling        Problem List Patient Active Problem List   Diagnosis Date Noted  . Parkinson's disease (HCC) 05/04/2013    MARRIOTT,AMY W., PT 05/07/2019, 3:24 PM  Amy Bernie CoveyMarriott, PT 05/07/19 3:24 PM Phone:  [phone] Fax: [phone]    [facility] [address] [place], [place], [address] Phone: [phone]   Fax:  [phone]  Name: YASIN DUCAT MRN: [id number] Date of Birth: 02-19-1951

## 2019-05-11 ENCOUNTER — Ambulatory Visit: Payer: Medicare Other | Admitting: Physical Therapy

## 2019-05-19 ENCOUNTER — Other Ambulatory Visit: Payer: Self-pay

## 2019-05-19 ENCOUNTER — Ambulatory Visit: Payer: Medicare Other | Admitting: Occupational Therapy

## 2019-05-19 DIAGNOSIS — R29898 Other symptoms and signs involving the musculoskeletal system: Secondary | ICD-10-CM

## 2019-05-19 DIAGNOSIS — R278 Other lack of coordination: Secondary | ICD-10-CM

## 2019-05-19 DIAGNOSIS — R41842 Visuospatial deficit: Secondary | ICD-10-CM

## 2019-05-19 DIAGNOSIS — R41844 Frontal lobe and executive function deficit: Secondary | ICD-10-CM

## 2019-05-19 DIAGNOSIS — R29818 Other symptoms and signs involving the nervous system: Secondary | ICD-10-CM

## 2019-05-19 DIAGNOSIS — R293 Abnormal posture: Secondary | ICD-10-CM

## 2019-05-19 DIAGNOSIS — R2689 Other abnormalities of gait and mobility: Secondary | ICD-10-CM

## 2019-05-19 NOTE — Therapy (Signed)
[facility] [address] [place], [place], [address] Phone: [phone]   Fax:  [phone]  Occupational Therapy Treatment  Patient Details  Name: Zachary [name] MRN: [id number] Date of Birth: 05/14/1951 Referring Provider (OT): Dr. Carles Collet   Encounter Date: 05/19/2019  OT End of Session - 05/19/19 0909    Visit Number  10   re-eval today   Number of Visits  22    Date for OT Re-Evaluation  06/06/19    Authorization Type  UHC Medicare    Authorization Time Period  12 weeks, POC written for 8 weeks, pt requests therapy 1x week initally    Authorization - Visit Number  10    Authorization - Number of Visits  10    OT Start Time  [id number]    OT Stop Time  0945    OT Time Calculation (min)  41 min    Activity Tolerance  Patient tolerated treatment well    Behavior During Therapy  [facility] for tasks assessed/performed       Past Medical History:  Diagnosis Date  . Allergy   . Asthma   . Parkinson's disease ([place])    Congenital R fourth nerve palsy    Past Surgical History:  Procedure Laterality Date  . CATARACT EXTRACTION, BILATERAL    . COLONOSCOPY    . SHOULDER SURGERY Left    lymph nodes  . TONSILLECTOMY    . VASECTOMY      There were no vitals filed for this visit.  Subjective Assessment - 05/19/19 0908    Subjective   Pt denies pain    Currently in Pain?  Yes    Pain Score  2     Pain Location  Foot    Pain Orientation  Left    Pain Descriptors / Indicators  Aching    Pain Type  Acute pain    Pain Onset  More than a month ago    Pain Frequency  Intermittent    Aggravating Factors   bumping it    Pain Relieving Factors  rest                 CLINIC OPERATION CHANGES: Outpatient Neuro Rehab is open at lower capacity following universal masking, social distancing, and patient screening.  The patient's COVID risk of complications score is 3 PWR! up, rock and twist, supine PWR! quadraped rock and  twist min v.c for larger amplitude movements, timing and technique. Seated flipping and dealing playing cards, min v.c for larger amplitude movements Pt attempted fastening buttons with adapted technique, however pt had max difficulty today stating this is his off time.          OT Education - 05/19/19 1245    Education provided  Yes    Education Details  strategies for dealing with freezing episodes, strategeis for fastening buttons, donning shirt with big movements    Person(s) Educated  Patient    Methods  Explanation;Demonstration;Verbal cues    Comprehension  Verbalized understanding       OT Short Term Goals - 05/19/19 0914      OT SHORT TERM GOAL #1   Title  I with PD specific HEP    Time  4    Period  Weeks    Status  On-going   needs reinforcement     OT SHORT TERM GOAL #2   Title  Pt will verbalize understanding of adapted strategies including strategies to address freezing  episodes for ADLS/IADLS in order to maximize safety and independence.    Time  4    Status  Achieved      OT SHORT TERM GOAL #3   Title  Pt will demonstrate improved independence with dressing as evidenced by decreasing 3 button/ unbutton time to 90 secs or less    Baseline  2 mins 16 secs    Status  Not Met   unable     OT SHORT TERM GOAL #4   Title  Pt/ wife will verbalize understanding of compensatory strategies for visual perceptual deficits.    Time  4    Period  Weeks    Status  On-going      OT SHORT TERM GOAL #5   Title  Pt will demonstrate ability to retrieve a lightweight object at 130 shoulder flexion with -10 elbow extension bilaterally.    Status  Achieved      OT SHORT TERM GOAL #6   Title  Pt will demonstrate improved RUE functional use as evidenced by increasing RUE box/ blocks score to 56 blocks or greater.    Baseline  RUE 53 blocks, LUE 60 blocks    Status  On-going        OT Long Term Goals - 05/05/19 1058      OT LONG TERM GOAL #1   Title  Pt will  verbalize understanding of community resources and ways to prevent future PD related complications.    Time  8    Period  Weeks    Status  On-going      OT LONG TERM GOAL #2   Title  Pt will verbalize understanding of compensatory strategies for short term memory deficits and ways to keep thinking skills sharp.    Time  8    Period  Weeks    Status  Achieved      OT LONG TERM GOAL #3   Title  Pt will demonstrate ability to write a short paragraph with 100% legibility and minimal decrease in letter size    Time  8    Period  Weeks    Status  Achieved      OT LONG TERM GOAL #4   Title  -------------------------------------            Plan - 05/19/19 1540    Clinical Impression Statement  Pt is progressing slowly towards goals. He remains limited by freezing episodes, decreased timing and cognitve deficits.    Occupational performance deficits (Please refer to evaluation for details):  ADL's;IADL's;Rest and Sleep;Leisure;Social Participation    Body Structure / Function / Physical Skills  ADL;Endurance;UE functional use;Balance;Vision;Flexibility;FMC;ROM;Coordination;Gait;IADL;Decreased knowledge of use of DME;Decreased knowledge of precautions;Dexterity;Mobility;Tone;Strength;GMC    Cognitive Skills  Attention;Memory;Perception;Problem Solve;Safety Awareness    Rehab Potential  Good    OT Frequency  2x / week    OT Duration  8 weeks    OT Treatment/Interventions  Self-care/ADL training;Visual/perceptual remediation/compensation;Aquatic Therapy;DME and/or AE instruction;Patient/family education;Passive range of motion;Balance training;Fluidtherapy;Functional Mobility Training;Splinting;Therapeutic exercise;Moist Heat;Manual Therapy;Therapeutic activities;Cognitive remediation/compensation;Neuromuscular education;Energy conservation    Plan  ADL strategies, PWR! moves    Consulted and Agree with Plan of Care  Patient       Patient will benefit from skilled therapeutic  intervention in order to improve the following deficits and impairments: coordination, timing, cognitive deficits, decreased balance, bradykinesia, rigidity Body Structure / Function / Physical Skills: ADL, Endurance, UE functional use, Balance, Vision, Flexibility, FMC, ROM, Coordination, Gait, IADL, Decreased knowledge of use of  DME, Decreased knowledge of precautions, Dexterity, Mobility, Tone, Strength, GMC Cognitive Skills: Attention, Memory, Perception, Problem Solve, Safety Awareness     Visit Diagnosis: 1. Other lack of coordination   2. Abnormal posture   3. Other abnormalities of gait and mobility   4. Other symptoms and signs involving the nervous system   5. Visuospatial deficit   6. Frontal lobe and executive function deficit   7. Other symptoms and signs involving the musculoskeletal system       Problem List Patient Active Problem List   Diagnosis Date Noted  . Parkinson's disease ([place]) 05/04/2013    , 05/19/2019, 3:42 PM Theone Murdoch, OTR/L [phone] Phone: [phone] 3:42 PM 05/19/19 [place] [facility] [address] [place], [place], [address] Phone: [phone]   Fax:  [phone]  Name: Zachary [name] MRN: [id number] Date of Birth: 1951-03-20

## 2019-05-21 ENCOUNTER — Encounter: Payer: Self-pay | Admitting: Physical Therapy

## 2019-05-21 ENCOUNTER — Other Ambulatory Visit: Payer: Self-pay

## 2019-05-21 ENCOUNTER — Ambulatory Visit: Payer: Medicare Other | Admitting: Physical Therapy

## 2019-05-21 DIAGNOSIS — R278 Other lack of coordination: Secondary | ICD-10-CM | POA: Diagnosis not present

## 2019-05-21 DIAGNOSIS — R293 Abnormal posture: Secondary | ICD-10-CM

## 2019-05-21 DIAGNOSIS — R2689 Other abnormalities of gait and mobility: Secondary | ICD-10-CM

## 2019-05-21 DIAGNOSIS — Z9181 History of falling: Secondary | ICD-10-CM

## 2019-05-21 DIAGNOSIS — R2681 Unsteadiness on feet: Secondary | ICD-10-CM

## 2019-05-21 NOTE — Therapy (Signed)
[facility] [address] [place], [place], [address] Phone: [phone]   Fax:  [phone]  Physical Therapy Treatment  Patient Details  Name: Zachary [name] [place] MRN: [id number] Date of Birth: Jul 14, 1951 Referring Provider (PT): Dr. Arbutus Leasat  CLINIC OPERATION CHANGES: Outpatient Neuro Rehab is open at lower capacity following universal masking, social distancing, and patient screening.  The patient's COVID risk of complications score is 3.  Encounter Date: 05/21/2019  PT End of Session - 05/21/19 1559    Visit Number  2    Number of Visits  13    Date for PT Re-Evaluation  07/06/19    Authorization Type  [employer] Medicare; 10th visit PN    PT Start Time  1304    PT Stop Time  1347    PT Time Calculation (min)  43 min    Equipment Utilized During Treatment  Gait belt    Activity Tolerance  Patient tolerated treatment well    Behavior During Therapy  WFL for tasks assessed/performed       Past Medical History:  Diagnosis Date  . Allergy   . Asthma   . Parkinson's disease ([facility])    Congenital R fourth nerve palsy    Past Surgical History:  Procedure Laterality Date  . CATARACT EXTRACTION, BILATERAL    . COLONOSCOPY    . SHOULDER SURGERY Left    lymph nodes  . TONSILLECTOMY    . VASECTOMY      There were no vitals filed for this visit.  Subjective Assessment - 05/21/19 1506    Subjective  Denies any falls or change since last visit.    Pertinent History  Parkinsons Disease    Patient Stated Goals  Helping with the balance, shuffling.    Currently in Pain?  No/denies         PWR [facility] - 05/21/19 1556    PWR! Up  x 20    PWR! Rock  x 20     PWR! Twist  x 20    PWR! Step  x 20    Comments  Supine    PWR! Up  x20    PWR! Rock  [employer]    PWR! Twist  x20    PWR! Step  x20    Comments  Prone    PWR! Up  x20    PWR! Rock  [employer]x20    PWR! Twist  x20    PWR Step  x20    Comments  Standing    PWR! Up  x20    PWR!  Rock  [employer]20    PWR! Twist  x20    PWR! Step  x20    Comments  Seated      Pt required VCs for slower, deliberate movement patterns, deliberate hold at end range to emphasize posture, weightshift, trunk rotation, initial step.  Able to correct but quickly reverts back to improper pattern/technique.  Performed forward and backward step weight shifting/step x 20 reps each without UE support.       PT Short Term Goals - 05/07/19 1431      PT SHORT TERM GOAL #1   Title  Pt will perform HEP with family's supervision, to address pt's Parkinson's related deficits. ALL TARGET DATES 05/28/2019    Time  3    Period  Weeks    Status  New    Target Date  05/28/19      PT SHORT TERM GOAL #2   Title  Pt will decrease 5 times sit <> stand to at least 13.5 seconds with no UE support without any posterior lean.    Baseline  15.03 seconds    Time  3    Period  Weeks    Status  New      PT SHORT TERM GOAL #3   Title  Pt/wife will verbalize and demonstrate tips to reduce freezing episodes with gait and turns.    Time  3    Period  Weeks    Status  New      PT SHORT TERM GOAL #4   Title  Pt will increase miniBEST to at least a 18/28 in order to decrease risk of future falls.    Baseline  16/28 on 05/07/19    Time  3    Period  Weeks    Status  New        PT Long Term Goals - 05/07/19 1434      PT LONG TERM GOAL #1   Title  Pt/wife will verbalize understanding of fall prevention in home environment.  ALL LTGS 06/24/19    Time  6    Period  Weeks    Status  New    Target Date  06/24/19      PT LONG TERM GOAL #2   Title  Pt will perform HEP with wife's supervision for improved balance, gait, posture.    Time  6    Period  Weeks    Status  New      PT LONG TERM GOAL #3   Title  Pt will improve MiniBESTest score to at least 21/28 for decreased fall risk.    Baseline  16/28 on 05/07/19    Time  6    Period  Weeks    Status  New      PT LONG TERM GOAL #4   Title  Pt will improve TUG  and TUG manual to <10% difference, for improved ability for dual tasking.    Baseline  normal TUG = 11.88 seconds, manual TUG = 16.15 seconds on 05/07/19    Time  6    Period  Weeks    Status  New      PT LONG TERM GOAL #5   Title  Patient will demonstrate proper gait mechanics using LRAD with no verbal cueing in order to increase safety ambulating at home and in the community.    Baseline  cueing for cane placement and gait mechanics on 05/07/19    Time  6    Period  Weeks    Status  New      Additional Long Term Goals   Additional Long Term Goals  Yes            Plan - 05/21/19 1603    Clinical Impression Statement  Skilled session focused on reviewing PWR! exercises in various positions working on quality of movement and decreasing speed to maintain quality.  Unsteady at times in standing but no falls/near falls.  Continue PT per POC.    Personal Factors and Comorbidities  Comorbidity 1;Past/Current Experience;Behavior Pattern    Comorbidities  PD    Examination-Activity Limitations  Stand;Stairs;Squat;Transfers;Locomotion Level    Stability/Clinical Decision Making  Evolving/Moderate complexity    Rehab Potential  Fair    Clinical Impairments Affecting Rehab Potential  impaired cognition    PT Frequency  2x / week    PT Duration  6 weeks    PT Treatment/Interventions  ADLs/Self Care Home Management;DME Instruction;Gait training;Stair training;Functional mobility training;Therapeutic activities;Therapeutic exercise;Balance training;Patient/family education;Neuromuscular re-education    PT Next Visit Plan  Gait training with 6 prong cane. Discuss ways to decrease episodes of freezing (with wife present), PWR exercises for upright posture, stepping strategies. Initial HEP for balance    Consulted and Agree with Plan of Care  Patient       Patient will benefit from skilled therapeutic intervention in order to improve the following deficits and impairments:  Abnormal gait, Decreased  activity tolerance, Decreased balance, Decreased coordination, Decreased mobility, Decreased safety awareness, Difficulty walking, Decreased strength, Impaired tone, Postural dysfunction, Decreased range of motion, Decreased cognition  Visit Diagnosis: 1. Other lack of coordination   2. Abnormal posture   3. Other abnormalities of gait and mobility   4. Unsteadiness on feet   5. History of falling        Problem List Patient Active Problem List   Diagnosis Date Noted  . Parkinson's disease ([facility]) 05/04/2013    Newell Coralenise Terry Robertson, PTA [facility] 05/21/19 4:09 PM Phone: [phone] Fax: [phone]   [facility] [address] [place], [place], [address] Phone: [phone]   Fax:  [phone]  Name: Zachary [name] [place]man MRN: [id number] Date of Birth: March 31, 1951

## 2019-05-26 ENCOUNTER — Ambulatory Visit: Payer: Medicare Other | Admitting: Occupational Therapy

## 2019-05-28 ENCOUNTER — Other Ambulatory Visit: Payer: Self-pay

## 2019-05-28 ENCOUNTER — Ambulatory Visit: Payer: Medicare Other | Admitting: Physical Therapy

## 2019-05-28 DIAGNOSIS — R2689 Other abnormalities of gait and mobility: Secondary | ICD-10-CM

## 2019-05-28 DIAGNOSIS — R278 Other lack of coordination: Secondary | ICD-10-CM

## 2019-05-28 DIAGNOSIS — R293 Abnormal posture: Secondary | ICD-10-CM

## 2019-05-28 DIAGNOSIS — Z9181 History of falling: Secondary | ICD-10-CM

## 2019-05-28 DIAGNOSIS — R2681 Unsteadiness on feet: Secondary | ICD-10-CM

## 2019-05-28 NOTE — Therapy (Signed)
[facility] [address] [place], [place], [address] Phone: [phone]   Fax:  [phone]  Physical Therapy Treatment  Patient Details  Name: Zachary LahStuart D [name] MRN: [id number] Date of Birth: 1951-02-21 Referring Provider (PT): Dr. Arbutus Leasat   Encounter Date: 05/28/2019   CLINIC OPERATION CHANGES: Outpatient Neuro Rehab is open at lower capacity following universal masking, social distancing, and patient screening.  The patient's COVID risk of complications score is 3.  PT End of Session - 05/28/19 1016    Visit Number  3    Number of Visits  13    Date for PT Re-Evaluation  07/06/19    Authorization Type  [employer] Medicare; 10th visit PN    PT Start Time  0908    PT Stop Time  0958    PT Time Calculation (min)  50 min    Activity Tolerance  Patient tolerated treatment well    Behavior During Therapy  [facility] for tasks assessed/performed       Past Medical History:  Diagnosis Date  . Allergy   . Asthma   . Parkinson's disease ([facility])    Congenital R fourth nerve palsy    Past Surgical History:  Procedure Laterality Date  . CATARACT EXTRACTION, BILATERAL    . COLONOSCOPY    . SHOULDER SURGERY Left    lymph nodes  . TONSILLECTOMY    . VASECTOMY      There were no vitals filed for this visit.  Subjective Assessment - 05/28/19 0912    Subjective  Reports going to [facility]" due to toe pain from a previous fall (Feb) and hit his foot.  States dr said there was a "crack" in toe and wants to watch it for a few weeks.  May need a screw per pt.  Asking about best mattress for bed mobility.  Reports having difficulty getting out of bed in middle of night and wife has to come over and help him.    Pertinent History  Parkinsons Disease    Patient Stated Goals  Helping with the balance, shuffling.    Currently in Pain?  No/denies       Session today focused on patients concern over difficulty getting into/out of bed.  Reports that  he is having increased times where is his medicine is "off" especially around 9 am.  Pt reports his wife is having to get up with him in the middle of the night to help him in/out of the bed.  Practiced bed mobility on elevated mat working on using various techniques and intensity/momentum to assist.  Pt tends to get into bed my facing the bed and climbing in on his knees then rolling over.  States if not he tends to get his trunk too far over to other side of bed.  Pt unable to lay supine and scoot his trunk to either side of the bed.  Used bridging to try to decrease "freezing" with bed mobility.  Pt is able to perform all bed mobility during session with supervision but feel like this is due to hard mat vs true bed and he does not use correct technique.  Discussed various rail options with pt but he feels like this might impact getting in the bed.  Discussed how height of bed can impact mobility as well.  Pt reports freezing sometimes while seated at the edge of his bed.    PT Education - 05/28/19 1014    Education Details  Bed  mobility techniques, contacting MD regarding "off" times with meds, pros/cons of bed rails vs hospital bed vs mattresses, techniques to prevent freezing with various transfers    Person(s) Educated  Patient    Methods  Explanation;Demonstration    Comprehension  Verbalized understanding       PT Short Term Goals - 05/07/19 1431      PT SHORT TERM GOAL #1   Title  Pt will perform HEP with family's supervision, to address pt's Parkinson's related deficits. ALL TARGET DATES 05/28/2019    Time  3    Period  Weeks    Status  New    Target Date  05/28/19      PT SHORT TERM GOAL #2   Title  Pt will decrease 5 times sit <> stand to at least 13.5 seconds with no UE support without any posterior lean.    Baseline  15.03 seconds    Time  3    Period  Weeks    Status  New      PT SHORT TERM GOAL #3   Title  Pt/wife will verbalize and demonstrate tips to reduce freezing  episodes with gait and turns.    Time  3    Period  Weeks    Status  New      PT SHORT TERM GOAL #4   Title  Pt will increase miniBEST to at least a 18/28 in order to decrease risk of future falls.    Baseline  16/28 on 05/07/19    Time  3    Period  Weeks    Status  New        PT Long Term Goals - 05/07/19 1434      PT LONG TERM GOAL #1   Title  Pt/wife will verbalize understanding of fall prevention in home environment.  ALL LTGS 06/24/19    Time  6    Period  Weeks    Status  New    Target Date  06/24/19      PT LONG TERM GOAL #2   Title  Pt will perform HEP with wife's supervision for improved balance, gait, posture.    Time  6    Period  Weeks    Status  New      PT LONG TERM GOAL #3   Title  Pt will improve MiniBESTest score to at least 21/28 for decreased fall risk.    Baseline  16/28 on 05/07/19    Time  6    Period  Weeks    Status  New      PT LONG TERM GOAL #4   Title  Pt will improve TUG and TUG manual to <10% difference, for improved ability for dual tasking.    Baseline  normal TUG = 11.88 seconds, manual TUG = 16.15 seconds on 05/07/19    Time  6    Period  Weeks    Status  New      PT LONG TERM GOAL #5   Title  Patient will demonstrate proper gait mechanics using LRAD with no verbal cueing in order to increase safety ambulating at home and in the community.    Baseline  cueing for cane placement and gait mechanics on 05/07/19    Time  6    Period  Weeks    Status  New      Additional Long Term Goals   Additional Long Term Goals  Yes  Plan - 05/28/19 1017    Clinical Impression Statement  Skilled session focused on bed mobility and education on various barriers/issues in home environment.  Pt continues to have freezing episodes, unsteadiness, and decreased functional mobility.  Continue PT per POC.    Personal Factors and Comorbidities  Comorbidity 1;Past/Current Experience;Behavior Pattern    Comorbidities  PD    Examination-Activity  Limitations  Stand;Stairs;Squat;Transfers;Locomotion Level    Stability/Clinical Decision Making  Evolving/Moderate complexity    Rehab Potential  Fair    Clinical Impairments Affecting Rehab Potential  impaired cognition    PT Frequency  2x / week    PT Duration  6 weeks    PT Treatment/Interventions  ADLs/Self Care Home Management;DME Instruction;Gait training;Stair training;Functional mobility training;Therapeutic activities;Therapeutic exercise;Balance training;Patient/family education;Neuromuscular re-education    PT Next Visit Plan  Review bed mobility techniques and using momentum.  Try to simulate bed rail and discuss options for rails/mattress again.  Follow up on if pt contacted MD regarding medications.  Gait training with 6 prong cane. Discuss ways to decrease episodes of freezing (with wife present), PWR exercises for upright posture, stepping strategies. Add posture at wall/door to HEP.    Consulted and Agree with Plan of Care  Patient       Patient will benefit from skilled therapeutic intervention in order to improve the following deficits and impairments:  Abnormal gait, Decreased activity tolerance, Decreased balance, Decreased coordination, Decreased mobility, Decreased safety awareness, Difficulty walking, Decreased strength, Impaired tone, Postural dysfunction, Decreased range of motion, Decreased cognition  Visit Diagnosis: 1. Other lack of coordination   2. Abnormal posture   3. Other abnormalities of gait and mobility   4. Unsteadiness on feet   5. History of falling        Problem List Patient Active Problem List   Diagnosis Date Noted  . Parkinson's disease ([place]) 05/04/2013    Narda Bonds, PTA [place] 05/28/19 11:52 AM Phone: [phone] Fax: [place] [address] [place], [place], [address] Phone: [phone]   Fax:   [phone]  Name: Zachary [name] MRN: [id number] Date of Birth: [birth year]

## 2019-05-29 ENCOUNTER — Telehealth: Payer: Self-pay | Admitting: Neurology

## 2019-05-29 DIAGNOSIS — R413 Other amnesia: Secondary | ICD-10-CM

## 2019-05-29 DIAGNOSIS — G20 Parkinson's disease: Secondary | ICD-10-CM

## 2019-05-29 MED ORDER — CARBIDOPA-LEVODOPA-ENTACAPONE 25-100-200 MG PO TABS: ORAL_TABLET | ORAL | 1 refills | Status: DC

## 2019-05-29 NOTE — Addendum Note (Signed)
Addended by: Ranae Plumber on: 05/29/2019 01:56 PM   Modules accepted: Orders

## 2019-05-29 NOTE — Telephone Encounter (Signed)
Pt requesting qty change for the extra 9 th tablet he takes a day  Rx resent with correction OFFICE NOTE FROM 01/20/19 Assessment and Plan:   1. PD, dx 06/1999 -He will continue Stalevo 2 tablets at 6:30 am, 1 tablet at 9:00 am, 1 tablets at 11:30 am, 2 tablet at 2 pm, 1 tablet at 4:30 pm, 1 tablet at 7:00 pm.   Can take an extra if needed, but needs to call me if he finds himself consistently taking an extra.  Talked about proper timing of medication.

## 2019-05-29 NOTE — Telephone Encounter (Signed)
Requested Prescriptions   Pending Prescriptions Disp Refills  . carbidopa-levodopa-entacapone (STALEVO) 25-100-200 MG tablet 720 tablet 1    Sig: TAKE 2 TABLETS AT 6:30AM, 1 TAB AT 9AM, 1 TAB AT 11:30AM, 2 AT 2PM, 1TAB AT 4:30PM, AND 7PM.   Rx last filled:03/05/19 #720 1 refills  Pt last seen:01/20/19   Follow up appt scheduled:06/22/19  sent

## 2019-05-29 NOTE — Telephone Encounter (Signed)
Carbidopa levodopa medication- he said it was increased from 8 to 9 for the sinimet. He is running out of it -he is needing a new prescription to [facility] on file is correct. Thanks!

## 2019-06-01 ENCOUNTER — Ambulatory Visit: Payer: Medicare Other | Admitting: Physical Therapy

## 2019-06-04 ENCOUNTER — Ambulatory Visit: Payer: Medicare Other | Admitting: Physical Therapy

## 2019-06-09 ENCOUNTER — Ambulatory Visit: Payer: Medicare Other | Admitting: Physical Therapy

## 2019-06-10 ENCOUNTER — Ambulatory Visit: Payer: Medicare Other | Admitting: Occupational Therapy

## 2019-06-15 ENCOUNTER — Encounter: Payer: Self-pay | Admitting: Physical Therapy

## 2019-06-15 ENCOUNTER — Ambulatory Visit: Payer: Medicare Other | Attending: Neurology | Admitting: Physical Therapy

## 2019-06-15 ENCOUNTER — Other Ambulatory Visit: Payer: Self-pay

## 2019-06-15 DIAGNOSIS — R2689 Other abnormalities of gait and mobility: Secondary | ICD-10-CM | POA: Diagnosis not present

## 2019-06-15 DIAGNOSIS — R278 Other lack of coordination: Secondary | ICD-10-CM | POA: Diagnosis present

## 2019-06-15 DIAGNOSIS — R29818 Other symptoms and signs involving the nervous system: Secondary | ICD-10-CM | POA: Diagnosis present

## 2019-06-15 DIAGNOSIS — R29898 Other symptoms and signs involving the musculoskeletal system: Secondary | ICD-10-CM | POA: Diagnosis present

## 2019-06-15 DIAGNOSIS — R41842 Visuospatial deficit: Secondary | ICD-10-CM | POA: Diagnosis present

## 2019-06-15 DIAGNOSIS — R41844 Frontal lobe and executive function deficit: Secondary | ICD-10-CM | POA: Diagnosis present

## 2019-06-15 DIAGNOSIS — R293 Abnormal posture: Secondary | ICD-10-CM | POA: Insufficient documentation

## 2019-06-15 DIAGNOSIS — R2681 Unsteadiness on feet: Secondary | ICD-10-CM

## 2019-06-15 DIAGNOSIS — Z9181 History of falling: Secondary | ICD-10-CM | POA: Diagnosis present

## 2019-06-15 NOTE — Therapy (Signed)
[facility] [address] [place], [place], [address] Phone: [phone]   Fax:  [phone]  Physical Therapy Treatment  Patient Details  Name: Zachary [name] MRN: [id number] Date of Birth: 1950/11/27 Referring Provider (PT): Dr. Carles Collet   Encounter Date: 06/15/2019  CLINIC OPERATION CHANGES: [facility]patient [facility] is open at lower capacity following universal masking, social distancing, and patient screening.  The patient's COVID risk of complications score is 3.  PT End of Session - 06/15/19 2048    Visit Number  4    Number of Visits  13    Date for PT Re-Evaluation  07/06/19    Authorization Type  [employer] Medicare; 10th visit PN    PT Start Time  1105    PT Stop Time  1150    PT Time Calculation (min)  45 min    Activity Tolerance  Patient tolerated treatment well    Behavior During Therapy  WFL for tasks assessed/performed       Past Medical History:  Diagnosis Date  . Allergy   . Asthma   . Parkinson's disease ([place])    Congenital R fourth nerve palsy    Past Surgical History:  Procedure Laterality Date  . CATARACT EXTRACTION, BILATERAL    . COLONOSCOPY    . SHOULDER SURGERY Left    lymph nodes  . TONSILLECTOMY    . VASECTOMY      There were no vitals filed for this visit.  Subjective Assessment - 06/15/19 1108    Subjective  Doing okay as long as my medication is "on".  I'm due for my medication at 11:30.  [place] when I [place] up and tripped going up the steps.    Pertinent History  Parkinsons Disease    Patient Stated Goals  Helping with the balance, shuffling.    Currently in Pain?  No/denies                       [facility] Adult PT Treatment/Exercise - 06/15/19 0001      Transfers   Transfers  Sit to Stand;Stand to Sit    Sit to Stand  6: Modified independent (Device/Increase time);5: Supervision;With armrests    Five time sit to stand comments    7.25    Comments  At beginning of session, pt reports he is "on" with his medications today      Ambulation/Gait   Ambulation/Gait  Yes    Ambulation/Gait Assistance  4: Min guard;5: Supervision    Ambulation Distance (Feet)  230 Feet   x 2, 115 ft   Assistive device  Straight cane   straight cane with 6 prongs (rounded)   Gait Pattern  Step-through pattern;Decreased arm swing - left;Decreased arm swing - right;Decreased stride length;Shuffle;Festinating;Trunk flexed;Narrow base of support    Ambulation Surface  Level;Indoor    Ramp  5: Supervision;4: Min assist    Ramp Details (indicate cue type and reason)  Min A with descending ramp, x 3 reps; cues for slowed pace, upright posture, increased heelstrike    Gait Comments  Discussed tips to reduce freezing /hastening with gait, including need to use cane consistently, versus holding/carrying cane.  PT provides verbal cues and assistance for cane sequencing.  Cues to slow pace, increase step length and widen BOS.      Timed Up and Go Test   TUG  Normal TUG;Cognitive TUG    Normal  TUG (seconds)  9.09    Cognitive TUG (seconds)  9.37      High Level Balance   High Level Balance Comments  MiniBESTest score:  20/28 (improved from 16/28)        Mini-BESTest: Balance Evaluation Systems Test  2005-2013 [employer]. All rights reserved. ________________________________________________________________________________________Anticipatory_________Subscore___5__/6 1. SIT TO STAND Instruction: "Cross your arms across your chest. Try not to use your hands unless you must.Do not let your legs lean against the back of the chair when you stand. Please stand up now." X(2) Normal: Comes to stand without use of hands and stabilizes independently. (1) Moderate: Comes to stand WITH use of hands on first attempt. (0) Severe: Unable to stand up from chair without assistance, OR needs several attempts with use of hands. 2. RISE TO  TOES Instruction: "Place your feet shoulder width apart. Place your hands on your hips. Try to rise as high as you can onto your toes. I will count out loud to 3 seconds. Try to hold this pose for at least 3 seconds. Look straight ahead. Rise now." X(2) Normal: Stable for 3 s with maximum height. (1) Moderate: Heels up, but not full range (smaller than when holding hands), OR noticeable instability for 3 s. (0) Severe: < 3 s. 3. STAND ON ONE LEG Instruction: "Look straight ahead. Keep your hands on your hips. Lift your leg off of the ground behind you without touching or resting your raised leg upon your other standing leg. Stay standing on one leg as long as you can. Look straight ahead. Lift now." Left: Time in Seconds Trial 1:__17.19___Trial 2:__17.93___ (2) Normal: 20 s. X(1) Moderate: < 20 s. (0) Severe: Unable. Right: Time in Seconds Trial 1:___7.19__Trial 2:__14.69___ (2) Normal: 20 s. X(1) Moderate: < 20 s. (0) Severe: Unable To score each side separately use the trial with the longest time. To calculate the sub-score and total score use the side [left or right] with the lowest numerical score [i.e. the worse side]. ______________________________________________________________________________________Reactive Postural Control___________Subscore:__1___/6 4. COMPENSATORY STEPPING CORRECTION- FORWARD Instruction: "Stand with your feet shoulder width apart, arms at your sides. Lean forward against my hands beyond your forward limits. When I let go, do whatever is necessary, including taking a step, to avoid a fall." (2) Normal: Recovers independently with a single, large step (second realignment step is allowed). (1) Moderate: More than one step used to recover equilibrium. X(0) Severe: No step, OR would fall if not caught, OR falls spontaneously. 5. COMPENSATORY STEPPING CORRECTION- BACKWARD Instruction: "Stand with your feet shoulder width apart, arms at your sides. Lean backward  against my hands beyond your backward limits. When I let go, do whatever is necessary, including taking a step, to avoid a fall." (2) Normal: Recovers independently with a single, large step. X(1) Moderate: More than one step used to recover equilibrium. (0) Severe: No step, OR would fall if not caught, OR falls spontaneously. 6. COMPENSATORY STEPPING CORRECTION- LATERAL Instruction: "Stand with your feet together, arms down at your sides. Lean into my hand beyond your sideways limit. When I let go, do whatever is necessary, including taking a step, to avoid a fall." Left (2) Normal: Recovers independently with 1 step (crossover or lateral OK). (1) Moderate: Several steps to recover equilibrium. X(0) Severe: Falls, or cannot step. Right (2) Normal: Recovers independently with 1 step (crossover or lateral OK). (1) Moderate: Several steps to recover equilibrium. X(0) Severe: Falls, or cannot step. Use the side with the lowest score to  calculate sub-score and total score. ____________________________________________________________________________________Sensory Orientation_____________Subscore:______6___/6 7. STANCE (FEET TOGETHER); EYES OPEN, FIRM SURFACE Instruction: "Place your hands on your hips. Place your feet together until almost touching. Look straight ahead. Be as stable and still as possible, until I say stop." Time in seconds:________ X(2) Normal: 30 s. (1) Moderate: < 30 s. (0) Severe: Unable. 8. STANCE (FEET TOGETHER); EYES CLOSED, FOAM SURFACE Instruction: "Step onto the foam. Place your hands on your hips. Place your feet together until almost touching. Be as stable and still as possible, until I say stop. I will start timing when you close your eyes." Time in seconds:________ X(2) Normal: 30 s. (1) Moderate: < 30 s. (0) Severe: Unable. 9. INCLINE- EYES CLOSED Instruction: "Step onto the incline ramp. Please stand on the incline ramp with your toes toward the top.  Place your feet shoulder width apart and have your arms down at your sides. I will start timing when you close your eyes." Time in seconds:________ X(2) Normal: Stands independently 30 s and aligns with gravity. (1) Moderate: Stands independently <30 s OR aligns with surface. (0) Severe: Unable. _________________________________________________________________________________________Dynamic Gait ______Subscore______8__/10 10. CHANGE IN GAIT SPEED Instruction: "Begin walking at your normal speed, when I tell you 'fast', walk as fast as you can. When I say 'slow', walk very slowly." X(2) Normal: Significantly changes walking speed without imbalance. (1) Moderate: Unable to change walking speed or signs of imbalance. (0) Severe: Unable to achieve significant change in walking speed AND signs of imbalance. [place] - HORIZONTAL Instruction: "Begin walking at your normal speed, when I say "right", turn your head and look to the right. When I say "left" turn your head and look to the left. Try to keep yourself walking in a straight line." (2) Normal: performs head turns with no change in gait speed and good balance. X(1) Moderate: performs head turns with reduction in gait speed. (0) Severe: performs head turns with imbalance. 12. WALK WITH PIVOT TURNS Instruction: "Begin walking at your normal speed. When I tell you to 'turn and stop', turn as quickly as you can, face the opposite direction, and stop. After the turn, your feet should be close together." X(2) Normal: Turns with feet close FAST (< 3 steps) with good balance. (1) Moderate: Turns with feet close SLOW (>4 steps) with good balance. (0) Severe: Cannot turn with feet close at any speed without imbalance. 13. STEP OVER OBSTACLES Instruction: "Begin walking at your normal speed. When you get to the box, step over it, not around it and keep walking." (2) Normal: Able to step over box with minimal change of gait speed and  with good balance. X(1) Moderate: Steps over box but touches box OR displays cautious behavior by slowing gait. (0) Severe: Unable to step over box OR steps around box. 14. TIMED UP & GO WITH DUAL TASK [3 METER WALK] Instruction TUG: "When I say 'Go', stand up from chair, walk at your normal speed across the tape on the floor, turn around, and come back to sit in the chair." Instruction TUG with Dual Task: "Count backwards by threes starting at ___. When I say 'Go', stand up from chair, walk at your normal speed across the tape on the floor, turn around, and come back to sit in the chair. Continue counting backwards the entire time." TUG: __9.09______seconds; Dual Task TUG: _____9.37___seconds [place]) Normal: No noticeable change in sitting, standing or walking while backward counting when compared to TUG without Dual Task. (1) Moderate:  Dual Task affects either counting OR walking (>10%) when compared to the TUG without Dual Task. (0) Severe: Stops counting while walking OR stops walking while counting. When scoring item 14, if subject's gait speed slows more than 10% between the TUG without and with a Dual Task the score should be decreased by a point. TOTAL SCORE: ______20__/28    Discussed progress towards goals, POC and reiterated importance of consistent HEP and PWR! Moves performance (did not completely check HEP today due to time limitations.)     PT Short Term Goals - 06/15/19 2034      PT SHORT TERM GOAL #1   Title  Pt will perform HEP with family's supervision, to address pt's Parkinson's related deficits. ALL TARGET DATES 05/28/2019    Time  3    Period  Weeks    Status  On-going    Target Date  05/28/19      PT SHORT TERM GOAL #2   Title  Pt will decrease 5 times sit <> stand to at least 13.5 seconds with no UE support without any posterior lean.    Baseline  15.03 seconds; 7.25 sec 06/15/2019    Time  3    Period  Weeks    Status  Achieved      PT SHORT TERM GOAL #3    Title  Pt/wife will verbalize and demonstrate tips to reduce freezing episodes with gait and turns.    Time  3    Period  Weeks    Status  Partially Met      PT SHORT TERM GOAL #4   Title  Pt will increase miniBEST to at least a 18/28 in order to decrease risk of future falls.    Baseline  16/28 on 05/07/19; 20/28 06/15/2019    Time  3    Period  Weeks    Status  Achieved        PT Long Term Goals - 05/07/19 1434      PT LONG TERM GOAL #1   Title  Pt/wife will verbalize understanding of fall prevention in home environment.  ALL LTGS 06/24/19    Time  6    Period  Weeks    Status  New    Target Date  06/24/19      PT LONG TERM GOAL #2   Title  Pt will perform HEP with wife's supervision for improved balance, gait, posture.    Time  6    Period  Weeks    Status  New      PT LONG TERM GOAL #3   Title  Pt will improve MiniBESTest score to at least 21/28 for decreased fall risk.    Baseline  16/28 on 05/07/19    Time  6    Period  Weeks    Status  New      PT LONG TERM GOAL #4   Title  Pt will improve TUG and TUG manual to <10% difference, for improved ability for dual tasking.    Baseline  normal TUG = 11.88 seconds, manual TUG = 16.15 seconds on 05/07/19    Time  6    Period  Weeks    Status  New      PT LONG TERM GOAL #5   Title  Patient will demonstrate proper gait mechanics using LRAD with no verbal cueing in order to increase safety ambulating at home and in the community.    Baseline  cueing for cane placement  and gait mechanics on 05/07/19    Time  6    Period  Weeks    Status  New      Additional Long Term Goals   Additional Long Term Goals  Yes            Plan - 06/15/19 2048    Clinical Impression Statement  Assessed STGs today, with pt meeting 2 of 4 STGs.  STG 2 and 4 met for improved 5x sit<>stand and MiniBESTest scores.  STG 3 partially met, with PT discussign and practicing with pt today, tips to reduce freezing and hastening with gait.  STG ongoing,  not assessed today, due to time limitations in session.  Pt continues to have fluctuations in mobility with on/off times, even in session today, with pt taking medication at 11:30.  Pt will continue to benefit from skilled PT to reinforce tips to reduce freezing, hastening with gait and to improve balance.    Personal Factors and Comorbidities  Comorbidity 1;Past/Current Experience;Behavior Pattern    Comorbidities  PD    Examination-Activity Limitations  Stand;Stairs;Squat;Transfers;Locomotion Level    Stability/Clinical Decision Making  Evolving/Moderate complexity    Rehab Potential  Fair    Clinical Impairments Affecting Rehab Potential  impaired cognition    PT Frequency  2x / week    PT Duration  6 weeks    PT Treatment/Interventions  ADLs/Self Care Home Management;DME Instruction;Gait training;Stair training;Functional mobility training;Therapeutic activities;Therapeutic exercise;Balance training;Patient/family education;Neuromuscular re-education    PT Next Visit Plan  Review bed mobility techniques and using momentum.  Try to simulate bed rail and discuss options for rails/mattress again.  Follow up on if pt contacted MD regarding medications.  Gait training with 6 prong cane. Discuss ways to decrease episodes of freezing (with wife present), PWR exercises for upright posture, stepping strategies. Add posture at wall/door to HEP.   May need to add additional week of appts to align with POC-discuss with pt   Consulted and Agree with Plan of Care  Patient       Patient will benefit from skilled therapeutic intervention in order to improve the following deficits and impairments:  Abnormal gait, Decreased activity tolerance, Decreased balance, Decreased coordination, Decreased mobility, Decreased safety awareness, Difficulty walking, Decreased strength, Impaired tone, Postural dysfunction, Decreased range of motion, Decreased cognition  Visit Diagnosis: 1. Other abnormalities of gait and  mobility   2. Unsteadiness on feet        Problem List Patient Active Problem List   Diagnosis Date Noted  . Parkinson's disease ([place]) 05/04/2013    Melquiades Kovar W. 06/15/2019, 8:55 PM  Frazier Butt., PT   [facility] [address] [place], [place], [address] Phone: [phone]   Fax:  [phone]  Name: Zachary [name] MRN: [id number] Date of Birth: December 05, 1950

## 2019-06-15 NOTE — Patient Instructions (Signed)

## 2019-06-17 ENCOUNTER — Other Ambulatory Visit: Payer: Self-pay

## 2019-06-17 ENCOUNTER — Ambulatory Visit: Payer: Medicare Other | Admitting: Occupational Therapy

## 2019-06-17 ENCOUNTER — Encounter: Payer: Self-pay | Admitting: Occupational Therapy

## 2019-06-17 DIAGNOSIS — R41842 Visuospatial deficit: Secondary | ICD-10-CM

## 2019-06-17 DIAGNOSIS — R278 Other lack of coordination: Secondary | ICD-10-CM

## 2019-06-17 DIAGNOSIS — R41844 Frontal lobe and executive function deficit: Secondary | ICD-10-CM

## 2019-06-17 DIAGNOSIS — R2689 Other abnormalities of gait and mobility: Secondary | ICD-10-CM | POA: Diagnosis not present

## 2019-06-17 DIAGNOSIS — R2681 Unsteadiness on feet: Secondary | ICD-10-CM

## 2019-06-17 DIAGNOSIS — R29818 Other symptoms and signs involving the nervous system: Secondary | ICD-10-CM

## 2019-06-17 DIAGNOSIS — R293 Abnormal posture: Secondary | ICD-10-CM

## 2019-06-17 DIAGNOSIS — R29898 Other symptoms and signs involving the musculoskeletal system: Secondary | ICD-10-CM

## 2019-06-17 NOTE — Patient Instructions (Signed)

## 2019-06-18 ENCOUNTER — Other Ambulatory Visit: Payer: Self-pay

## 2019-06-18 ENCOUNTER — Ambulatory Visit: Payer: Medicare Other | Admitting: Physical Therapy

## 2019-06-18 ENCOUNTER — Encounter: Payer: Self-pay | Admitting: Physical Therapy

## 2019-06-18 DIAGNOSIS — R2689 Other abnormalities of gait and mobility: Secondary | ICD-10-CM | POA: Diagnosis not present

## 2019-06-18 DIAGNOSIS — R278 Other lack of coordination: Secondary | ICD-10-CM

## 2019-06-18 DIAGNOSIS — Z9181 History of falling: Secondary | ICD-10-CM

## 2019-06-18 DIAGNOSIS — R293 Abnormal posture: Secondary | ICD-10-CM

## 2019-06-18 NOTE — Progress Notes (Signed)
Zachary [name] was seen today in follow up for Parkinsons disease.  Wife supplements hx.  Pt is currently on Stalevo 100 2 tablets at 6:30 AM, 2 tablet at 9 AM (an increase since last visit), 1 tablet at 11:30 AM, 2 tablets at 2 PM, 1 tablet at 4:30 PM, 1 tablet at 7 PM.   Feels that the 9am dosage often isn't working well.  Sometimes that dose works and sometimes not.  He states that a while back he tried inbrija one time but didn't like the granular feeling of it (he also mentions that he thought that he thought that it was a nasal spray).   He is on carbidopa/levodopa 50/200 CR at bedtime. Some tremor in the night.  Tremor may wake him up or he is up to use the bathroom and notes tremor.  He is acting out the dreams verbally but not falling out of the bed.   He is off of Azilect.  He is still on amantadine, 100 mg in the morning.  Pt had several falls, the most recent 2 weeks ago.  Stepping down outside on stone/concrete and fell on knee.  Had a near fall in the bathtub and required brother in law help to get him out of the bathtub.  Has podiatry appt tomorrow because of associated foot pain.   Pt denies lightheadedness, near syncope.  No hallucinations.  Mood has been good.  Previous meds:  Inbrija; carbidopa/levodopa 25/100; carbidopa/levodopa 50/200; inbrija; amantadine; azilect  ALLERGIES:   Allergies  Allergen Reactions  . Tree Extract     CURRENT MEDICATIONS:  Outpatient Encounter Medications as of 06/22/2019  Medication Sig  . amantadine (SYMMETREL) 100 MG capsule TAKE 1 CAPSULE DAILY.  . carbidopa-levodopa (SINEMET CR) 50-200 MG tablet Take 1 tablet by mouth at bedtime.  . carbidopa-levodopa-entacapone (STALEVO) 25-100-200 MG tablet TAKE 2 TABLETS AT 6:30AM, 1 TAB AT 9AM, 1 TAB AT 11:30AM, 2 AT 2PM, 1TAB AT 4:30PM, AND 7PM. TAKE EXTRA TABLET IF NEEDED.  Marland Kitchen. PROAIR HFA 108 (90 Base) MCG/ACT inhaler   . UNABLE TO FIND Hemp drops PRN during the day  . [DISCONTINUED] rasagiline (AZILECT)  1 MG TABS tablet TAKE 1 TABLET BY MOUTH DAILY. (Patient not taking: Reported on 06/22/2019)   No facility-administered encounter medications on file as of 06/22/2019.     PAST MEDICAL HISTORY:   Past Medical History:  Diagnosis Date  . Allergy   . Asthma   . Parkinson's disease ([facility])    Congenital R fourth nerve palsy    PAST SURGICAL HISTORY:   Past Surgical History:  Procedure Laterality Date  . CATARACT EXTRACTION, BILATERAL    . COLONOSCOPY    . SHOULDER SURGERY Left    lymph nodes  . TONSILLECTOMY    . VASECTOMY      SOCIAL HISTORY:   Social History   Socioeconomic History  . Marital status: Married    Spouse name: Rosey Batheresa  . Number of children: 3  . Years of education: Ph.D  . Highest education level: Doctorate  Occupational History  . Occupation: retired    Associate Professormployer: [employer]    Comment: professor - Merchandiser, [employer]  Social Needs  . Financial resource strain: Not on file  . Food insecurity    Worry: Not on file    Inability: Not on file  . Transportation needs    Medical: Not on file    Non-medical: Not on file  Tobacco Use  . Smoking status: Never Smoker  .  Smokeless tobacco: Never Used  Substance and Sexual Activity  . Alcohol use: Yes    Alcohol/week: 0.0 standard drinks    Comment: two drinks a week  . Drug use: No    Comment: hemp oil  . Sexual activity: Not on file  Lifestyle  . Physical activity    Days per week: Not on file    Minutes per session: Not on file  . Stress: Not on file  Relationships  . Social Herbalist on phone: Not on file    Gets together: Not on file    Attends religious service: Not on file    Active member of club or organization: Not on file    Attends meetings of clubs or organizations: Not on file    Relationship status: Not on file  . Intimate partner violence    Fear of current or ex partner: Not on file    Emotionally abused: Not on file    Physically abused: Not on file    Forced sexual activity:  Not on file  Other Topics Concern  . Not on file  Social History Narrative   Pt lives at home with family.   Caffeine Use: 1-2 cups daily.    FAMILY HISTORY:   Family Status  Relation Name Status  . Mother  Deceased  . Father  Deceased at age 35       lungs, kidney  . Brother  Alive  . MGM  (Not Specified)  . Child x3 Alive    ROS:  ROS  PHYSICAL EXAMINATION:    VITALS:   Vitals:   06/22/19 0812  BP: 114/70  Pulse: 72  SpO2: 97%  Weight: 172 lb (78 kg)  Height: 5' 9.5" (1.765 m)    GEN:  The patient appears stated age and is in NAD. HEENT:  Normocephalic, atraumatic.  The mucous membranes are moist. The superficial temporal arteries are without ropiness or tenderness. CV:  RRR Lungs:  CTAB Neck/HEME:  There are no carotid bruits bilaterally.  Neurological examination:  Orientation: The patient is alert and oriented x3. Cranial nerves: There is good facial symmetry with significant facial hypomimia. The speech is fluent and clear. Soft palate rises symmetrically and there is no tongue deviation. Hearing is intact to conversational tone. Sensation: Sensation is intact to light touch throughout Motor: Strength is at least antigravity x4.  Movement examination: Tone: There is mod increased tone in the bilateral UE Abnormal movements: there is rare rest tremor in the LUE Coordination:  There is mild decremation with RAM's, with hand opening and closing bilaterally.   Gait and Station: The patient has no difficulty arising out of a deep-seated chair without the use of the hands. The patient's stride length is good with purposeful arm swing bilaterally.      ASSESSMENT/PLAN:  1.  Parkinson's disease- diagnosed in 2000  -Increase Stalevo from stalevo 100 to stalevo 150, 2 tablets at 6:30 AM, 2 tablet at 9 AM, 1 tablet at 11:30 AM, 2 tablets at 2 PM, 1 tablet at 4:30 PM, 1 tablet at 7 PM,  -Continue carbidopa/levodopa 50/200 at bedtime.  -Discontinue amantadine   -retry Inbrija - one spray up to 5 times per day.  Only tried 1 time in the past.  Given new samples.    -in PT now.    2.  REM behavior disorder  -start klonopin 0.5 mg - 1/2 tablet at night.  Risks, benefits, side effects and alternative therapies were  discussed.  The opportunity to ask questions was given and they were answered to the best of my ability.  The patient expressed understanding and willingness to follow the outlined treatment protocols.  -they just bought safety rails for the bed  3.  Mild cognitive impairment  -Last neurocognitive testing was in January, 2019.  There was evidence of mild cognitive impairment, but no dementia.  4.  Follow up is anticipated in the next 4-6 months, sooner should new neurologic issues arise.  Much greater than 50% of this visit was spent in counseling and coordinating care.  Total face to face time:  30 min   Cc:  Martha ClanShaw, William, MD

## 2019-06-18 NOTE — Therapy (Signed)
[facility] [address] [place], [place], [address] Phone: [phone]   Fax:  [phone]  Physical Therapy Treatment  Patient Details  Name: Zachary [name] MRN: [id number] Date of Birth: [birth year] Referring Provider (PT): Dr. Carles Collet   Encounter Date: 06/18/2019  PT End of Session - 06/18/19 1310    Visit Number  5    Number of Visits  13    Date for PT Re-Evaluation  07/06/19    Authorization Type  [employer] Medicare; 10th visit PN    PT Start Time  1105    PT Stop Time  1148    PT Time Calculation (min)  43 min    Activity Tolerance  Patient tolerated treatment well    Behavior During Therapy  [facility] for tasks assessed/performed       Past Medical History:  Diagnosis Date  . Allergy   . Asthma   . Parkinson's disease ([place])    Congenital R fourth nerve palsy    Past Surgical History:  Procedure Laterality Date  . CATARACT EXTRACTION, BILATERAL    . COLONOSCOPY    . SHOULDER SURGERY Left    lymph nodes  . TONSILLECTOMY    . VASECTOMY      There were no vitals filed for this visit.  Subjective Assessment - 06/18/19 1127    Subjective  Pt reports now starting to have L foot pain [place] on [place] visit[place]  Has not been to md and has been changing bandage on R knee daily.  Pt reports he has purchase a rail for his bed and this has helped with mobility.    Pertinent History  Parkinsons Disease    Patient Stated Goals  Helping with the balance, shuffling.    Currently in Pain?  Yes    Pain Score  1     Pain Location  Foot    Pain Orientation  Left;Medial;Anterior    Pain Type  Acute pain         [facility] Adult PT Treatment/Exercise - 06/18/19 0001      Bed Mobility   Bed Mobility  Rolling Right;Rolling Left;Right Sidelying to Sit;Sitting - Scoot to Edge of Bed   Pt able to perform all aspects with supervision and vc's Pt reports purchasing railing for his bed at home and  feels like this has helped.  Was able to get up last night without assist but varies nightly.  Worked again on using momentum as needed for bed mobility along with discussion on importance of initial positioning back onto bed (vs on edge) so pt has more room to roll without being so close to the edge.     Rolling Right  Supervision/verbal cueing    Rolling Left  Supervision/Verbal cueing    Right Sidelying to Sit  Supervision/Verbal cueing    Sitting - Scoot to Edge of Bed  Supervision/Verbal cueing      Posture/Postural Control   Posture/Postural Control  Postural limitations    Postural Limitations  Rounded Shoulders;Flexed trunk    Posture Comments  Performed standing at door frame x 60 seconds x 2.  Provided as HEP.  Pt needed cues to avoid hyperextension of trunk.  Pt also reports performing stretch facing door frame and reaching for top of frame with bil UE's.  Again reinforced proper mechanics.  Patient tends to keep forwardly flexed posture during gait in mildy crouched position.  Self-Care   Self-Care  Other Self-Care Comments    Other Self-Care Comments   Discussed pt's recent fall and new c/o L great toe and foot pain.  Examined L foot which has some yellow bruising running along medial aspect of foot.  Also mild edema noted at metatarsals and navicular.  Pt reports having a "crack" in great toe approximately 6 months ago.  Recommended for pt to make an appointment with MD.  Provided information on several ortho urgent care facilities in the area as pt does not have an ortho md already (in case primary care can not see pt).      Knee/Hip Exercises: Aerobic   Other Aerobic  Scifit level 2.0 all 4 extremities x 8 minutes working on >100 rpm.  Pt able to maintain without cues today.             PT Education - 06/18/19 1309    Education Details  Door frame posture exercise, options for ortho urgent care sites to f/u on L foot pain after fall    Person(s) Educated  Patient     Methods  Explanation;Demonstration;Handout       PT Short Term Goals - 06/15/19 2034      PT SHORT TERM GOAL #1   Title  Pt will perform HEP with family's supervision, to address pt's Parkinson's related deficits. ALL TARGET DATES 05/28/2019    Time  3    Period  Weeks    Status  On-going    Target Date  05/28/19      PT SHORT TERM GOAL #2   Title  Pt will decrease 5 times sit <> stand to at least 13.5 seconds with no UE support without any posterior lean.    Baseline  15.03 seconds; 7.25 sec 06/15/2019    Time  3    Period  Weeks    Status  Achieved      PT SHORT TERM GOAL #3   Title  Pt/wife will verbalize and demonstrate tips to reduce freezing episodes with gait and turns.    Time  3    Period  Weeks    Status  Partially Met      PT SHORT TERM GOAL #4   Title  Pt will increase miniBEST to at least a 18/28 in order to decrease risk of future falls.    Baseline  16/28 on 05/07/19; 20/28 06/15/2019    Time  3    Period  Weeks    Status  Achieved        PT Long Term Goals - 05/07/19 1434      PT LONG TERM GOAL #1   Title  Pt/wife will verbalize understanding of fall prevention in home environment.  ALL LTGS 06/24/19    Time  6    Period  Weeks    Status  New    Target Date  06/24/19      PT LONG TERM GOAL #2   Title  Pt will perform HEP with wife's supervision for improved balance, gait, posture.    Time  6    Period  Weeks    Status  New      PT LONG TERM GOAL #3   Title  Pt will improve MiniBESTest score to at least 21/28 for decreased fall risk.    Baseline  16/28 on 05/07/19    Time  6    Period  Weeks    Status  New  PT LONG TERM GOAL #4   Title  Pt will improve TUG and TUG manual to <10% difference, for improved ability for dual tasking.    Baseline  normal TUG = 11.88 seconds, manual TUG = 16.15 seconds on 05/07/19    Time  6    Period  Weeks    Status  New      PT LONG TERM GOAL #5   Title  Patient will demonstrate proper gait mechanics using LRAD  with no verbal cueing in order to increase safety ambulating at home and in the community.    Baseline  cueing for cane placement and gait mechanics on 05/07/19    Time  6    Period  Weeks    Status  New      Additional Long Term Goals   Additional Long Term Goals  Yes            Plan - 06/18/19 1310    Clinical Impression Statement  Skilled session focused on bed mobility, exercise and education.  Pt reports having an upcoming appt Monday with Dr [name] to discuss his medications.  Pt continues to have falls in the community and reports freezing as well. Continue PT per POC.    Personal Factors and Comorbidities  Comorbidity 1;Past/Current Experience;Behavior Pattern    Comorbidities  PD    Examination-Activity Limitations  Stand;Stairs;Squat;Transfers;Locomotion Level    Stability/Clinical Decision Making  Evolving/Moderate complexity    Rehab Potential  Fair    Clinical Impairments Affecting Rehab Potential  impaired cognition    PT Frequency  2x / week    PT Duration  6 weeks    PT Treatment/Interventions  ADLs/Self Care Home Management;DME Instruction;Gait training;Stair training;Functional mobility training;Therapeutic activities;Therapeutic exercise;Balance training;Patient/family education;Neuromuscular re-education    PT Next Visit Plan  Follow up on L foot pain and if pt saw an MD.  Follow up on appt with Dr [name].  Gait training with 6 prong cane. Discuss ways to decrease episodes of freezing (with wife present), PWR exercises for upright posture, stepping strategies.   May need to add additional week of appts to align with POC-discuss with pt   Consulted and Agree with Plan of Care  Patient       Patient will benefit from skilled therapeutic intervention in order to improve the following deficits and impairments:  Abnormal gait, Decreased activity tolerance, Decreased balance, Decreased coordination, Decreased mobility, Decreased safety awareness, Difficulty walking, Decreased  strength, Impaired tone, Postural dysfunction, Decreased range of motion, Decreased cognition  Visit Diagnosis: Abnormal posture  History of falling  Other lack of coordination  Other abnormalities of gait and mobility     Problem List Patient Active Problem List   Diagnosis Date Noted  . Parkinson's disease ([place]) 05/04/2013    Narda Bonds, PTA [facility] 06/18/19 1:17 PM Phone: [phone] Fax: [facility] [address] [place], [place], [address] Phone: [phone]   Fax:  [phone]  Name: Zachary [name] MRN: [id number] Date of Birth: 1951-04-26

## 2019-06-18 NOTE — Patient Instructions (Signed)
Exercise for stooped posture    Stand, back to wall with head, shoulders, buttocks and heels all touching wall. Hold position 60 seconds, then take two steps away from wall. Step back to wall and correct position if needed. Repeat 3 times. Do 2 sessions per day.  http://gt2.exer.us/687   Copyright  VHI. All rights reserved.

## 2019-06-18 NOTE — Therapy (Signed)
[facility] [address] [place], [place], [address] Phone: [phone]   Fax:  [phone]  Occupational Therapy Treatment  Patient Details  Name: Zachary [name] MRN: [id number] Date of Birth: 1951-07-10 Referring Provider (OT): Dr. Carles Collet   Encounter Date: 06/17/2019  OT End of Session - 06/18/19 1605    Visit Number  11   re-eval today   Number of Visits  22    Date for OT Re-Evaluation  06/06/19    Authorization Type  [employer] Medicare    Authorization Time Period  12 weeks, POC written for 8 weeks, pt requests therapy 1x week initally    Authorization - Visit Number  11    Authorization - Number of Visits  16   re-eval visit 6   OT Start Time  0354    OT Stop Time  1445    OT Time Calculation (min)  38 min    Activity Tolerance  Patient tolerated treatment well    Behavior During Therapy  WFL for tasks assessed/performed       Past Medical History:  Diagnosis Date  . Allergy   . Asthma   . Parkinson's disease ([place])    Congenital R fourth nerve palsy    Past Surgical History:  Procedure Laterality Date  . CATARACT EXTRACTION, BILATERAL    . COLONOSCOPY    . SHOULDER SURGERY Left    lymph nodes  . TONSILLECTOMY    . VASECTOMY      There were no vitals filed for this visit.  Subjective Assessment - 06/17/19 1407    Subjective   Pt reports a fall    Currently in Pain?  Yes    Pain Score  1    knee LUE s/p fall   Pain Location  Hand    Pain Orientation  Left    Pain Descriptors / Indicators  Aching    Pain Onset  More than a month ago    Pain Frequency  Intermittent    Aggravating Factors   movement    Pain Relieving Factors  rest                           OT Education - 06/18/19 1607    Education Details  Ways to prevent future PD related complications, reviewed coordination HEP, checked progress towards goals and discussed with patient , compensating for visual deficits   Person(s) Educated  Patient    Methods  Explanation;Demonstration;Verbal cues;Handout    Comprehension  Verbalized understanding;Returned demonstration;Verbal cues required       OT Short Term Goals - 06/17/19 1419      OT SHORT TERM GOAL #1   Title  I with PD specific HEP    Time  4    Period  Weeks    Status  Achieved   needs reinforcement     OT SHORT TERM GOAL #2   Title  Pt will verbalize understanding of adapted strategies including strategies to address freezing episodes for ADLS/IADLS in order to maximize safety and independence.    Time  4    Status  Achieved      OT SHORT TERM GOAL #3   Title  Pt will demonstrate improved independence with dressing as evidenced by decreasing 3 button/ unbutton time to 90 secs or less    Baseline  2 mins 16 secs    Status  Not Met   unable  OT SHORT TERM GOAL #4   Title  Pt/ wife will verbalize understanding of compensatory strategies for visual perceptual deficits.    Time  4    Period  Weeks    Status  Achieved      OT SHORT TERM GOAL #5   Title  Pt will demonstrate ability to retrieve a lightweight object at 130 shoulder flexion with -10 elbow extension bilaterally.    Status  Achieved      OT SHORT TERM GOAL #6   Title  Pt will demonstrate improved RUE functional use as evidenced by increasing RUE box/ blocks score to 56 blocks or greater.    Baseline  RUE 53 blocks, LUE 60 blocks    Status  Not Met   55 improved but 1 short of goal.       OT Long Term Goals - 06/17/19 1410      OT LONG TERM GOAL #1   Title  Pt will verbalize understanding of community resources and ways to prevent future PD related complications.    Time  8    Period  Weeks    Status  Achieved      OT LONG TERM GOAL #2   Title  Pt will verbalize understanding of compensatory strategies for short term memory deficits and ways to keep thinking skills sharp.    Time  8    Period  Weeks    Status  Achieved      OT LONG TERM GOAL #3   Title  Pt  will demonstrate ability to write a short paragraph with 100% legibility and minimal decrease in letter size    Time  8    Period  Weeks    Status  Achieved      OT LONG TERM GOAL #4   Title  -------------------------------------            Plan - 06/18/19 1613    Clinical Impression Statement  Pt demonstrates progress towards goals. He agrees with plans fro d/c.    Occupational performance deficits (Please refer to evaluation for details):  ADL's;IADL's;Rest and Sleep;Leisure;Social Participation    Body Structure / Function / Physical Skills  ADL;Endurance;UE functional use;Balance;Vision;Flexibility;FMC;ROM;Coordination;Gait;IADL;Decreased knowledge of use of DME;Decreased knowledge of precautions;Dexterity;Mobility;Tone;Strength;GMC    Cognitive Skills  Attention;Memory;Perception;Problem Solve;Safety Awareness    Rehab Potential  Good    OT Frequency  2x / week    OT Duration  8 weeks    OT Treatment/Interventions  Self-care/ADL training;Visual/perceptual remediation/compensation;Aquatic Therapy;DME and/or AE instruction;Patient/family education;Passive range of motion;Balance training;Fluidtherapy;Functional Mobility Training;Splinting;Therapeutic exercise;Moist Heat;Manual Therapy;Therapeutic activities;Cognitive remediation/compensation;Neuromuscular education;Energy conservation    Plan  d/c OT,    Consulted and Agree with Plan of Care  Patient     OCCUPATIONAL THERAPY DISCHARGE SUMMARY   Current functional level related to goals / functional outcomes: Pt made progress towards goals.   Remaining deficits: Bradykinesia, decreased coordination abnormal posture, decreased balance, cognitive deficits, visual perceptual deficits.   Education / Equipment: Pt was educated regarding HEP, ways to prevent future PD related complications, ADL strategies, compensating for visual perceptual deficits. Pt verbalized understanding.  Plan: Patient agrees to discharge.  Patient goals  were partially met.                                                                                                      ?????  Patient will benefit from skilled therapeutic intervention in order to improve the following deficits and impairments:   Body Structure / Function / Physical Skills: ADL, Endurance, UE functional use, Balance, Vision, Flexibility, FMC, ROM, Coordination, Gait, IADL, Decreased knowledge of use of DME, Decreased knowledge of precautions, Dexterity, Mobility, Tone, Strength, GMC Cognitive Skills: Attention, Memory, Perception, Problem Solve, Safety Awareness     Visit Diagnosis: Other lack of coordination  Visuospatial deficit  Frontal lobe and executive function deficit  Other symptoms and signs involving the musculoskeletal system  Other symptoms and signs involving the nervous system  Abnormal posture  Other abnormalities of gait and mobility  Unsteadiness on feet    Problem List Patient Active Problem List   Diagnosis Date Noted  . Parkinson's disease ([place]) 05/04/2013    Zachary [name] 06/18/2019, 4:15 PM  [facility] [address] [place], [place], [address] Phone: [phone]   Fax:  [phone]  Name: Zachary [name] MRN: [id number] Date of Birth: 1951/08/23

## 2019-06-19 ENCOUNTER — Other Ambulatory Visit: Payer: Self-pay

## 2019-06-22 ENCOUNTER — Other Ambulatory Visit: Payer: Self-pay

## 2019-06-22 ENCOUNTER — Encounter: Payer: Self-pay | Admitting: Physical Therapy

## 2019-06-22 ENCOUNTER — Encounter: Payer: Self-pay | Admitting: Neurology

## 2019-06-22 ENCOUNTER — Ambulatory Visit: Payer: Medicare Other | Admitting: Physical Therapy

## 2019-06-22 ENCOUNTER — Ambulatory Visit (INDEPENDENT_AMBULATORY_CARE_PROVIDER_SITE_OTHER): Payer: Medicare Other | Admitting: Neurology

## 2019-06-22 VITALS — BP 114/70 | HR 72 | Ht 69.5 in | Wt 172.0 lb

## 2019-06-22 DIAGNOSIS — R29818 Other symptoms and signs involving the nervous system: Secondary | ICD-10-CM

## 2019-06-22 DIAGNOSIS — G20 Parkinson's disease: Secondary | ICD-10-CM

## 2019-06-22 DIAGNOSIS — G4752 REM sleep behavior disorder: Secondary | ICD-10-CM

## 2019-06-22 DIAGNOSIS — R278 Other lack of coordination: Secondary | ICD-10-CM

## 2019-06-22 DIAGNOSIS — R2689 Other abnormalities of gait and mobility: Secondary | ICD-10-CM | POA: Diagnosis not present

## 2019-06-22 MED ORDER — CARBIDOPA-LEVODOPA-ENTACAPONE 37.5-150-200 MG PO TABS: ORAL_TABLET | ORAL | 1 refills | Status: DC

## 2019-06-22 MED ORDER — CLONAZEPAM 0.5 MG PO TABS: 0.2500 mg | ORAL_TABLET | Freq: Every day | ORAL | 1 refills | Status: DC

## 2019-06-22 NOTE — Therapy (Signed)
[facility] [address] [place], [place], [address] Phone: [phone]   Fax:  [phone]  Physical Therapy Treatment  Patient Details  Name: Zachary [name] MRN: [id number] Date of Birth: [birth year] Referring Provider (PT): Dr. Carles Collet   Encounter Date: 06/22/2019  CLINIC OPERATION CHANGES: [facility] is open at lower capacity following universal masking, social distancing, and patient screening.  The patient's COVID risk of complications score is 3.   PT End of Session - 06/22/19 2005    Visit Number  6    Number of Visits  13    Date for PT Re-Evaluation  07/06/19    Authorization Type  [employer] Medicare; 10th visit PN    PT Start Time  1022    PT Stop Time  1055   treatment session shorted due to foot pain-trying to avoid excess weigthbearing until he sees MD   PT Time Calculation (min)  33 min    Activity Tolerance  Patient tolerated treatment well    Behavior During Therapy  WFL for tasks assessed/performed       Past Medical History:  Diagnosis Date  . Allergy   . Asthma   . Parkinson's disease ([place])    Congenital R fourth nerve palsy    Past Surgical History:  Procedure Laterality Date  . CATARACT EXTRACTION, BILATERAL    . COLONOSCOPY    . SHOULDER SURGERY Left    lymph nodes  . TONSILLECTOMY    . VASECTOMY      There were no vitals filed for this visit.  Subjective Assessment - 06/22/19 1024    Subjective  Pt reports having continued pain in L foot and will see MD tomorrow.  Bruising has gone down, but painful when I go from sit to stand on it.    Pertinent History  Parkinsons Disease    Patient Stated Goals  Helping with the balance, shuffling.    Currently in Pain?  Yes    Pain Score  1     Pain Location  Foot    Pain Orientation  Left;Anterior;Medial    Pain Descriptors / Indicators  Aching;Sharp    Pain Type  Acute pain    Pain Onset  More than a month ago    Pain Frequency   Intermittent    Aggravating Factors   movement    Pain Relieving Factors  rest          Therapeutic Activity: Bed mobility practice, at 20" mat, with simulated bed rail: -Sit>supine (initially with no bed rail, with increased time), with repeated cues and repeated practice with simulated bed rail, pt able to rock through hips to initiate movement, then go to left sidelying then to supine.  -Once supine, practiced varied techniques to help him get unfrozen (including supine PWR! Up through hips x 5 reps, supine PWR! Rock x 3 reps, with pt preferring to initiate with UEs, cues provided for BIG effort to utilize rocking motion through hips to initiate movement), Hooklying trunk rotation 5 reps each side, then cues to use trunk rotation to initiate rolling.  Supine hooklying marching in place x 5 reps each leg.  -Supine>sit practice, repeated cues, repeated reps, using trunk rotation and momentum, on 3-count, roll to L side>sideyling>sit edge of bed, using UEs on simulated rail.  Throughout trials and practice today, pt requires cues for use of momentum to initiate movement and for hand placement on simulated bed rail.   Pt briefly reviews  postural stretch provided last visit-return demo understanding.                     PT Education - 06/22/19 2004    Education provided  Yes    Education Details  Lower trunk rotation, ways to initiate bed mobility    Person(s) Educated  Patient    Methods  Explanation;Demonstration;Verbal cues;Handout    Comprehension  Verbalized understanding;Returned demonstration;Verbal cues required       PT Short Term Goals - 06/15/19 2034      PT SHORT TERM GOAL #1   Title  Pt will perform HEP with family's supervision, to address pt's Parkinson's related deficits. ALL TARGET DATES 05/28/2019    Time  3    Period  Weeks    Status  On-going    Target Date  05/28/19      PT SHORT TERM GOAL #2   Title  Pt will decrease 5 times sit <> stand to  at least 13.5 seconds with no UE support without any posterior lean.    Baseline  15.03 seconds; 7.25 sec 06/15/2019    Time  3    Period  Weeks    Status  Achieved      PT SHORT TERM GOAL #3   Title  Pt/wife will verbalize and demonstrate tips to reduce freezing episodes with gait and turns.    Time  3    Period  Weeks    Status  Partially Met      PT SHORT TERM GOAL #4   Title  Pt will increase miniBEST to at least a 18/28 in order to decrease risk of future falls.    Baseline  16/28 on 05/07/19; 20/28 06/15/2019    Time  3    Period  Weeks    Status  Achieved        PT Long Term Goals - 05/07/19 1434      PT LONG TERM GOAL #1   Title  Pt/wife will verbalize understanding of fall prevention in home environment.  ALL LTGS 06/24/19    Time  6    Period  Weeks    Status  New    Target Date  06/24/19      PT LONG TERM GOAL #2   Title  Pt will perform HEP with wife's supervision for improved balance, gait, posture.    Time  6    Period  Weeks    Status  New      PT LONG TERM GOAL #3   Title  Pt will improve MiniBESTest score to at least 21/28 for decreased fall risk.    Baseline  16/28 on 05/07/19    Time  6    Period  Weeks    Status  New      PT LONG TERM GOAL #4   Title  Pt will improve TUG and TUG manual to <10% difference, for improved ability for dual tasking.    Baseline  normal TUG = 11.88 seconds, manual TUG = 16.15 seconds on 05/07/19    Time  6    Period  Weeks    Status  New      PT LONG TERM GOAL #5   Title  Patient will demonstrate proper gait mechanics using LRAD with no verbal cueing in order to increase safety ambulating at home and in the community.    Baseline  cueing for cane placement and gait mechanics on 05/07/19  Time  6    Period  Weeks    Status  New      Additional Long Term Goals   Additional Long Term Goals  Yes            Plan - 06/22/19 2005    Clinical Impression Statement  At pt's request today (as well as out of caution due to  pt's ongoing foot pain and he hasn't seen orthopedic MD), PT session focused mainly on bed mobility and ways to get out of freezing episodes with bed mobility.  During session with cues, pt is able to perform simulated bed situation (with simulated bed rail), but he does tend to speed up with practice, and he notes that at times in bed he is completely frozen.  He notes Dr. Carles Collet prescribed new medicaiton to help with off times.    Personal Factors and Comorbidities  Comorbidity 1;Past/Current Experience;Behavior Pattern    Comorbidities  PD    Examination-Activity Limitations  Stand;Stairs;Squat;Transfers;Locomotion Level    Stability/Clinical Decision Making  Evolving/Moderate complexity    Rehab Potential  Fair    Clinical Impairments Affecting Rehab Potential  impaired cognition    PT Frequency  2x / week    PT Duration  6 weeks    PT Treatment/Interventions  ADLs/Self Care Home Management;DME Instruction;Gait training;Stair training;Functional mobility training;Therapeutic activities;Therapeutic exercise;Balance training;Patient/family education;Neuromuscular re-education    PT Next Visit Plan  Follow up on L foot pain and if pt saw an MD.  Discussed additional appts, and pt feels he will be ready for d/c next visit; so will need to check goals and d/c.   May need to add additional week of appts to align with POC-discuss with pt   Consulted and Agree with Plan of Care  Patient       Patient will benefit from skilled therapeutic intervention in order to improve the following deficits and impairments:  Abnormal gait, Decreased activity tolerance, Decreased balance, Decreased coordination, Decreased mobility, Decreased safety awareness, Difficulty walking, Decreased strength, Impaired tone, Postural dysfunction, Decreased range of motion, Decreased cognition  Visit Diagnosis: Other lack of coordination  Other symptoms and signs involving the nervous system     Problem List Patient Active  Problem List   Diagnosis Date Noted  . Parkinson's disease ([place]) 05/04/2013    [place] Michie W. 06/22/2019, 8:11 PM  Frazier Butt., PT   [facility] [address] [place], [place], [address] Phone: [phone]   Fax:  [phone]  Name: KEIANDRE CYGAN MRN: [id number] Date of Birth: [birth year]

## 2019-06-22 NOTE — Patient Instructions (Addendum)
-   Stop Amantadine - Stop stalevo 100 mg - Start stavelo 150 mg 2 tablets at 6:30, 2 tablets at 9 AM, 1 tablet at 11:30 AM, 2 tablets 2 PM, 1 tablet at 4:30 PM, 1 tablet at 7 PM  - Start Klonopin 1/2 tablet at bedtime - Inbrija up to 5 times a day

## 2019-06-22 NOTE — Patient Instructions (Signed)
Access Code: [email or web address]  URL: [email or web address]  Date: 06/22/2019  Prepared by: Mady Haagensen   Exercises  Supine Lower Trunk Rotation - 10 reps - 2 sets - 1x daily - 7x weekly

## 2019-06-25 ENCOUNTER — Other Ambulatory Visit: Payer: Self-pay | Admitting: Neurology

## 2019-06-25 ENCOUNTER — Encounter: Payer: Self-pay | Admitting: Physical Therapy

## 2019-06-25 ENCOUNTER — Other Ambulatory Visit: Payer: Self-pay

## 2019-06-25 ENCOUNTER — Ambulatory Visit: Payer: Medicare Other | Admitting: Physical Therapy

## 2019-06-25 DIAGNOSIS — R293 Abnormal posture: Secondary | ICD-10-CM

## 2019-06-25 DIAGNOSIS — G20 Parkinson's disease: Secondary | ICD-10-CM

## 2019-06-25 DIAGNOSIS — R29818 Other symptoms and signs involving the nervous system: Secondary | ICD-10-CM

## 2019-06-25 DIAGNOSIS — R2689 Other abnormalities of gait and mobility: Secondary | ICD-10-CM | POA: Diagnosis not present

## 2019-06-25 NOTE — Telephone Encounter (Signed)
Requested Prescriptions   Pending Prescriptions Disp Refills  . amantadine (SYMMETREL) 100 MG capsule [Pharmacy Med Name: AMANTADINE 100 MG CAPSULE] 30 capsule 0    Sig: TAKE 1 CAPSULE DAILY.   Rx last filled:03/30/19 #90 0 refills  Pt last seen: 06/22/19   ASSESSMENT/PLAN:  1.  Parkinson's disease- diagnosed in 2000             -Increase Stalevo from stalevo 100 to stalevo 150, 2 tablets at 6:30 AM, 2 tablet at 9 AM, 1 tablet at 11:30 AM, 2 tablets at 2 PM, 1 tablet at 4:30 PM, 1 tablet at 7 PM,             -Continue carbidopa/levodopa 50/200 at bedtime.             -Discontinue amantadine             -retry Inbrija - one spray up to 5 times per day.  Only tried 1 time in the past.  Given new samples.               -in PT now.   Follow up appt scheduled:12/24/2019

## 2019-06-25 NOTE — Patient Instructions (Signed)

## 2019-06-26 ENCOUNTER — Encounter: Payer: Self-pay | Admitting: Physical Therapy

## 2019-06-26 NOTE — Telephone Encounter (Signed)
Requested Prescriptions   Pending Prescriptions Disp Refills  . amantadine (SYMMETREL) 100 MG capsule [Pharmacy Med Name: AMANTADINE 100 MG CAPSULE] 30 capsule 0    Sig: TAKE 1 CAPSULE DAILY.  RX DENIED PATIENT NO LONGER TAKEN PER LAST OFFICE VISIT.    ASSESSMENT/PLAN:  1.  Parkinson's disease- diagnosed in 2000             -Increase Stalevo from stalevo 100 to stalevo 150, 2 tablets at 6:30 AM, 2 tablet at 9 AM, 1 tablet at 11:30 AM, 2 tablets at 2 PM, 1 tablet at 4:30 PM, 1 tablet at 7 PM,             -Continue carbidopa/levodopa 50/200 at bedtime.             -Discontinue amantadine             -retry Inbrija - one spray up to 5 times per day.  Only tried 1 time in the past.  Given new samples.               -in PT now.

## 2019-06-26 NOTE — Therapy (Signed)
[facility] [address] [place], [place], [address] Phone: [phone]   Fax:  [phone]  Physical Therapy Treatment  Patient Details  Name: Zachary [name] MRN: [id number] Date of Birth: 02-20-1951 Referring Provider (PT): Dr. Carles Collet   Encounter Date: 06/25/2019  CLINIC OPERATION CHANGES: [facility] is open at lower capacity following universal masking, social distancing, and patient screening.  The patient's COVID risk of complications score is 3.   PT End of Session - 06/26/19 1451    Visit Number  7    Number of Visits  13    Date for PT Re-Evaluation  07/06/19    Authorization Type  [employer] Medicare; 10th visit PN    PT Start Time  1150    PT Stop Time  1230    PT Time Calculation (min)  40 min    Activity Tolerance  Patient tolerated treatment well    Behavior During Therapy  WFL for tasks assessed/performed       Past Medical History:  Diagnosis Date  . Allergy   . Asthma   . Parkinson's disease ([place])    Congenital R fourth nerve palsy    Past Surgical History:  Procedure Laterality Date  . CATARACT EXTRACTION, BILATERAL    . COLONOSCOPY    . SHOULDER SURGERY Left    lymph nodes  . TONSILLECTOMY    . VASECTOMY      There were no vitals filed for this visit.  Subjective Assessment - 06/25/19 1154    Subjective  I've had a very slow morning- the first time I've taken the Inbrija this morning.  Had the x-rays on my foot and MD is not concerned.    Pertinent History  Parkinsons Disease    Patient Stated Goals  Helping with the balance, shuffling.    Currently in Pain?  Yes    Pain Score  1     Pain Location  Foot    Pain Orientation  Left;Anterior;Medial    Pain Descriptors / Indicators  Aching    Pain Type  Acute pain    Pain Onset  More than a month ago    Pain Frequency  Intermittent    Aggravating Factors   movement    Pain Relieving Factors  rest                        [facility] Adult PT Treatment/Exercise - 06/26/19 0001      Ambulation/Gait   Ambulation/Gait  Yes    Ambulation/Gait Assistance  4: Min guard;5: Supervision    Ambulation/Gait Assistance Details  Pt did not bring his cane rounded prongs; he has significant difficulty sequencing SPC     Ambulation Distance (Feet)  230 Feet    Assistive device  Straight cane    Gait Pattern  Step-through pattern;Decreased arm swing - left;Decreased arm swing - right;Decreased stride length;Shuffle;Festinating;Trunk flexed;Narrow base of support;Decreased step length - right;Decreased step length - left    Ambulation Surface  Level;Indoor      High Level Balance   High Level Balance Comments  MiniBESTest score:  22/28 (see note for full details)      Self-Care   Self-Care  Other Self-Care Comments    Other Self-Care Comments   Pt reports he was seen by MD and states no fracture, but there may be multiple "cracks".  PT unable to view imaging, and pt unclear on MD results.  He does report  no restrictions/precautions.  Discussed fall prevnetion education, discussed progress towards goals, POC and pt's wanting to d/c today.  Discussed recent changes to medication regimine and pt/wife documenting any side effects or different symptoms durign this time for discussion to MD regarding new medication changes.          TUG:  9.43 sec, TUG cog (3rd attempt) 8.43 sec REviewed postural exercises at doorframe-pt return demo understanding. Mini-BESTest: Balance Evaluation Systems Test  2005-2013 [employer]. All rights reserved. ________________________________________________________________________________________Anticipatory_________Subscore__6___/6 1. SIT TO STAND Instruction: "Cross your arms across your chest. Try not to use your hands unless you must.Do not let your legs lean against the back of the chair when you stand. Please stand up now." X(2) Normal:  Comes to stand without use of hands and stabilizes independently. (1) Moderate: Comes to stand WITH use of hands on first attempt. (0) Severe: Unable to stand up from chair without assistance, OR needs several attempts with use of hands. 2. RISE TO TOES Instruction: "Place your feet shoulder width apart. Place your hands on your hips. Try to rise as high as you can onto your toes. I will count out loud to 3 seconds. Try to hold this pose for at least 3 seconds. Look straight ahead. Rise now." X(2) Normal: Stable for 3 s with maximum height. (1) Moderate: Heels up, but not full range (smaller than when holding hands), OR noticeable instability for 3 s. (0) Severe: < 3 s. 3. STAND ON ONE LEG Instruction: "Look straight ahead. Keep your hands on your hips. Lift your leg off of the ground behind you without touching or resting your raised leg upon your other standing leg. Stay standing on one leg as long as you can. Look straight ahead. Lift now." Left: Time in Seconds Trial 1:_____Trial 2:_____ X(2) Normal: 20 s. (1) Moderate: < 20 s. (0) Severe: Unable. Right: Time in Seconds Trial 1:_____Trial 2:_____ X(2) Normal: 20 s. (1) Moderate: < 20 s. (0) Severe: Unable To score each side separately use the trial with the longest time. To calculate the sub-score and total score use the side [left or right] with the lowest numerical score [i.e. the worse side]. ______________________________________________________________________________________Reactive Postural Control___________Subscore:___4__/6 4. COMPENSATORY STEPPING CORRECTION- FORWARD Instruction: "Stand with your feet shoulder width apart, arms at your sides. Lean forward against my hands beyond your forward limits. When I let go, do whatever is necessary, including taking a step, to avoid a fall." (2) Normal: Recovers independently with a single, large step (second realignment step is allowed). X(1) Moderate: More than one step used to  recover equilibrium. (0) Severe: No step, OR would fall if not caught, OR falls spontaneously. 5. COMPENSATORY STEPPING CORRECTION- BACKWARD Instruction: "Stand with your feet shoulder width apart, arms at your sides. Lean backward against my hands beyond your backward limits. When I let go, do whatever is necessary, including taking a step, to avoid a fall." (2) Normal: Recovers independently with a single, large step. X(1) Moderate: More than one step used to recover equilibrium. (0) Severe: No step, OR would fall if not caught, OR falls spontaneously. 6. COMPENSATORY STEPPING CORRECTION- LATERAL Instruction: "Stand with your feet together, arms down at your sides. Lean into my hand beyond your sideways limit. When I let go, do whatever is necessary, including taking a step, to avoid a fall." Left X(2) Normal: Recovers independently with 1 step (crossover or lateral OK). (1) Moderate: Several steps to recover equilibrium. (0) Severe: Falls, or cannot step. Right X(2)  Normal: Recovers independently with 1 step (crossover or lateral OK). (1) Moderate: Several steps to recover equilibrium. (0) Severe: Falls, or cannot step. Use the side with the lowest score to calculate sub-score and total score. ____________________________________________________________________________________Sensory Orientation_____________Subscore:_______6_/6 7. STANCE (FEET TOGETHER); EYES OPEN, FIRM SURFACE Instruction: "Place your hands on your hips. Place your feet together until almost touching. Look straight ahead. Be as stable and still as possible, until I say stop." Time in seconds:________ X(2) Normal: 30 s. (1) Moderate: < 30 s. (0) Severe: Unable. 8. STANCE (FEET TOGETHER); EYES CLOSED, FOAM SURFACE Instruction: "Step onto the foam. Place your hands on your hips. Place your feet together until almost touching. Be as stable and still as possible, until I say stop. I will start timing when you close  your eyes." Time in seconds:________ X(2) Normal: 30 s. (1) Moderate: < 30 s. (0) Severe: Unable. 9. INCLINE- EYES CLOSED Instruction: "Step onto the incline ramp. Please stand on the incline ramp with your toes toward the top. Place your feet shoulder width apart and have your arms down at your sides. I will start timing when you close your eyes." Time in seconds:________ X(2) Normal: Stands independently 30 s and aligns with gravity. (1) Moderate: Stands independently <30 s OR aligns with surface. (0) Severe: Unable. _________________________________________________________________________________________Dynamic Gait ______Subscore_____6___/10 10. CHANGE IN GAIT SPEED Instruction: "Begin walking at your normal speed, when I tell you 'fast', walk as fast as you can. When I say 'slow', walk very slowly." (2) Normal: Significantly changes walking speed without imbalance. X(1) Moderate: Unable to change walking speed or signs of imbalance. (0) Severe: Unable to achieve significant change in walking speed AND signs of imbalance. [place] - HORIZONTAL Instruction: "Begin walking at your normal speed, when I say "right", turn your head and look to the right. When I say "left" turn your head and look to the left. Try to keep yourself walking in a straight line." (2) Normal: performs head turns with no change in gait speed and good balance. X(1) Moderate: performs head turns with reduction in gait speed. (0) Severe: performs head turns with imbalance. 12. WALK WITH PIVOT TURNS Instruction: "Begin walking at your normal speed. When I tell you to 'turn and stop', turn as quickly as you can, face the opposite direction, and stop. After the turn, your feet should be close together." (2) Normal: Turns with feet close FAST (< 3 steps) with good balance. X(1) Moderate: Turns with feet close SLOW (>4 steps) with good balance. (0) Severe: Cannot turn with feet close at any speed without  imbalance. 13. STEP OVER OBSTACLES Instruction: "Begin walking at your normal speed. When you get to the box, step over it, not around it and keep walking." (2) Normal: Able to step over box with minimal change of gait speed and with good balance. X(1) Moderate: Steps over box but touches box OR displays cautious behavior by slowing gait. (0) Severe: Unable to step over box OR steps around box. 14. TIMED UP & GO WITH DUAL TASK [3 METER WALK] Instruction TUG: "When I say 'Go', stand up from chair, walk at your normal speed across the tape on the floor, turn around, and come back to sit in the chair." Instruction TUG with Dual Task: "Count backwards by threes starting at ___. When I say 'Go', stand up from chair, walk at your normal speed across the tape on the floor, turn around, and come back to sit in the chair. Continue counting backwards  the entire time." TUG: ________seconds; Dual Task TUG: ________seconds [place]) Normal: No noticeable change in sitting, standing or walking while backward counting when compared to TUG without Dual Task. (1) Moderate: Dual Task affects either counting OR walking (>10%) when compared to the TUG without Dual Task. (0) Severe: Stops counting while walking OR stops walking while counting. When scoring item 14, if subject's gait speed slows more than 10% between the TUG without and with a Dual Task the score should be decreased by a point. TOTAL SCORE: ____22____/28  Pt scores well on actual test items, but during session and with instructions for test items, pt is impulsive and requires multiple times that instructions are given to make sure pt performing activity correctly.      PT Education - 06/26/19 1450    Education Details  Fall prevention education, POC and progress towards goals, recommnedation for cane or walker at all times (pt does not have cane with him today)    Person(s) Educated  Patient    Methods  Explanation;Handout    Comprehension   Verbalized understanding;Returned demonstration       PT Short Term Goals - 06/15/19 2034      PT SHORT TERM GOAL #1   Title  Pt will perform HEP with family's supervision, to address pt's Parkinson's related deficits. ALL TARGET DATES 05/28/2019    Time  3    Period  Weeks    Status  On-going    Target Date  05/28/19      PT SHORT TERM GOAL #2   Title  Pt will decrease 5 times sit <> stand to at least 13.5 seconds with no UE support without any posterior lean.    Baseline  15.03 seconds; 7.25 sec 06/15/2019    Time  3    Period  Weeks    Status  Achieved      PT SHORT TERM GOAL #3   Title  Pt/wife will verbalize and demonstrate tips to reduce freezing episodes with gait and turns.    Time  3    Period  Weeks    Status  Partially Met      PT SHORT TERM GOAL #4   Title  Pt will increase miniBEST to at least a 18/28 in order to decrease risk of future falls.    Baseline  16/28 on 05/07/19; 20/28 06/15/2019    Time  3    Period  Weeks    Status  Achieved        PT Long Term Goals - 06/25/19 1158      PT LONG TERM GOAL #1   Title  Pt/wife will verbalize understanding of fall prevention in home environment.  ALL LTGS 06/24/19    Time  6    Period  Weeks    Status  Partially Met      PT LONG TERM GOAL #2   Title  Pt will perform HEP with wife's supervision for improved balance, gait, posture.    Time  6    Period  Weeks    Status  Partially Met      PT LONG TERM GOAL #3   Title  Pt will improve MiniBESTest score to at least 21/28 for decreased fall risk.    Baseline  16/28 on 05/07/19; 22/28 06/25/2019    Time  6    Period  Weeks    Status  Achieved      PT LONG TERM GOAL #4   Title  Pt will improve TUG and TUG manual to <10% difference, for improved ability for dual tasking.    Baseline  normal TUG = 11.88 seconds, manual TUG = 16.15 seconds on 05/07/19; 06/25/2019:  TUG 9.43: 3rd attemtp at TUG cog 8.34 sec    Time  6    Period  Weeks    Status  Achieved      PT LONG  TERM GOAL #5   Title  Patient will demonstrate proper gait mechanics using LRAD with no verbal cueing in order to increase safety ambulating at home and in the community.    Baseline  cueing for cane placement and gait mechanics on 05/07/19    Time  6    Period  Weeks    Status  Not Met            Plan - 06/26/19 1453    Clinical Impression Statement  Pt returns to PT today following MD visit for foot pain (he reports no overt fracture and no restrictions).  Assessed LTGs today, as pt states he feels he is ready for d/c.  Pt has met LTG 3,4 .  LTG 1, 2, partially met and LTG 5 not met.  While pt has demonstrated imporvement on functional measures such as MiniBESTest and TUG cognitive, he demonstrates continued decreased safety awareness, decreased timing and coordination of gait, recurrent falls.  With instruction in strategies to address the above, pt verbalizes understanding but demonstrates his own strategies that he uses regularly (somewhat unconventional strategies to reduce freezing).  Provided education on fall prevention and tips to reduce freezing episodes.    Personal Factors and Comorbidities  Comorbidity 1;Past/Current Experience;Behavior Pattern    Comorbidities  PD    Examination-Activity Limitations  Stand;Stairs;Squat;Transfers;Locomotion Level    Stability/Clinical Decision Making  Evolving/Moderate complexity    Rehab Potential  Fair    Clinical Impairments Affecting Rehab Potential  impaired cognition    PT Frequency  2x / week    PT Duration  6 weeks    PT Treatment/Interventions  ADLs/Self Care Home Management;DME Instruction;Gait training;Stair training;Functional mobility training;Therapeutic activities;Therapeutic exercise;Balance training;Patient/family education;Neuromuscular re-education    PT Next Visit Plan  D/C this visit (per pt request) and plan for return eval in 6 months.   May need to add additional week of appts to align with POC-discuss with pt    Consulted and Agree with Plan of Care  Patient       Patient will benefit from skilled therapeutic intervention in order to improve the following deficits and impairments:  Abnormal gait, Decreased activity tolerance, Decreased balance, Decreased coordination, Decreased mobility, Decreased safety awareness, Difficulty walking, Decreased strength, Impaired tone, Postural dysfunction, Decreased range of motion, Decreased cognition  Visit Diagnosis: Other symptoms and signs involving the nervous system  Other abnormalities of gait and mobility  Abnormal posture     Problem List Patient Active Problem List   Diagnosis Date Noted  . Parkinson's disease ([place]) 05/04/2013    Kindall Swaby W. 06/26/2019, 3:01 PM Frazier Butt., PT   [facility] [address] [place], [place], [address] Phone: [phone]   Fax:  [phone]  Name: Zachary [name] MRN: [id number] Date of Birth: 09-24-1951   PHYSICAL THERAPY DISCHARGE SUMMARY  Visits from Start of Care: 7  Current functional level related to goals / functional outcomes: PT Long Term Goals - 06/25/19 1158      PT LONG TERM GOAL #1   Title  Pt/wife will verbalize  understanding of fall prevention in home environment.  ALL LTGS 06/24/19    Time  6    Period  Weeks    Status  Partially Met      PT LONG TERM GOAL #2   Title  Pt will perform HEP with wife's supervision for improved balance, gait, posture.    Time  6    Period  Weeks    Status  Partially Met      PT LONG TERM GOAL #3   Title  Pt will improve MiniBESTest score to at least 21/28 for decreased fall risk.    Baseline  16/28 on 05/07/19; 22/28 06/25/2019    Time  6    Period  Weeks    Status  Achieved      PT LONG TERM GOAL #4   Title  Pt will improve TUG and TUG manual to <10% difference, for improved ability for dual tasking.    Baseline  normal TUG = 11.88 seconds, manual TUG = 16.15 seconds on 05/07/19;  06/25/2019:  TUG 9.43: 3rd attemtp at TUG cog 8.34 sec    Time  6    Period  Weeks    Status  Achieved      PT LONG TERM GOAL #5   Title  Patient will demonstrate proper gait mechanics using LRAD with no verbal cueing in order to increase safety ambulating at home and in the community.    Baseline  cueing for cane placement and gait mechanics on 05/07/19    Time  6    Period  Weeks    Status  Not Met      Pt has met 2 of 5 LTGs.    Remaining deficits: Decreased safety awareness, decreased timing and coordination of gait   Education / Equipment: Educated in tips to reduce freezing with gait, fall prevention  Plan: Patient agrees to discharge.  Patient goals were partially met. Patient is being discharged due to being pleased with the current functional level.  ?????Pt requests discharge as he feels comfortable with his progress.  Recommend PT eval in 6 months due to progressive nature of disease.           Mady Haagensen, PT 06/26/19 3:08 PM Phone: [phone] Fax: [phone]

## 2019-07-10 ENCOUNTER — Telehealth: Payer: Self-pay | Admitting: Neurology

## 2019-07-10 DIAGNOSIS — G20 Parkinson's disease: Secondary | ICD-10-CM

## 2019-07-10 NOTE — Telephone Encounter (Signed)
Order placed Printed/ signed by provider Fax to number given below

## 2019-07-10 NOTE — Telephone Encounter (Signed)
Patient's wife called and requested a prescription to be faxed to [facility] for a Mitrom Rollator to help with stability when out and about.  [place] Phone: [phone] Fax: [phone]

## 2019-07-10 NOTE — Telephone Encounter (Signed)
ok 

## 2019-07-15 ENCOUNTER — Telehealth: Payer: Self-pay | Admitting: Neurology

## 2019-07-15 NOTE — Telephone Encounter (Signed)
See last telephone note from 07/10/19 a Rx for this walker was sent to [facility] [place] patient to make him aware of this no answer left this information on voice mail

## 2019-07-15 NOTE — Telephone Encounter (Signed)
Patient is needing a Nitron walker- fax to: [facility]. Thanks!

## 2019-07-16 NOTE — Telephone Encounter (Signed)
Pt spouse pick up Rx order for walker today.

## 2019-09-08 ENCOUNTER — Telehealth: Payer: Self-pay | Admitting: Neurology

## 2019-09-08 NOTE — Telephone Encounter (Signed)
Pt is aware and he will start taking the medication as needed now and has no questions or concerns at this time

## 2019-09-08 NOTE — Telephone Encounter (Signed)
Per note the changes for the previous visit is : - Start stavelo 150 mg 2 tablets at 6:30, 2 tablets at 9 AM, 1 tablet at 11:30 AM, 2 tablets 2 PM, 1 tablet at 4:30 PM, 1 tablet at 7 PM  - Start Klonopin 1/2 tablet at bedtime  Please advise on below

## 2019-09-08 NOTE — Telephone Encounter (Signed)
Is he using the inbrija when he wakes up or feels off?  That is what it is for

## 2019-09-08 NOTE — Telephone Encounter (Signed)
Patient left msg with after hours about a change in his medication about 6 weeks ago. It worked for his sleeping. States he is still experiencing periods of "turning on and off". Unable to get much done, unable to walk correctly, he has to wait for his body to catch up. This is mainly in the morning. Thanks!

## 2019-09-22 ENCOUNTER — Other Ambulatory Visit: Payer: Self-pay

## 2019-09-22 ENCOUNTER — Ambulatory Visit (INDEPENDENT_AMBULATORY_CARE_PROVIDER_SITE_OTHER): Payer: Self-pay | Admitting: Clinical

## 2019-09-22 DIAGNOSIS — G20 Parkinson's disease: Secondary | ICD-10-CM

## 2019-09-22 DIAGNOSIS — F4321 Adjustment disorder with depressed mood: Secondary | ICD-10-CM

## 2019-09-22 NOTE — BH Specialist Note (Signed)
Referring Provider: Alonza Bogus, DO Date of Referral: 06/22/2019 Primary Reason for Referral: low-mood, depressive sx Location of Visit: pt and wife, virtual visit  Suicide/Homicide Risk: Pt denies risk  Subjective Notes: parkinsons "going downhill with pandemic" per pt Recent PD related struggles-freezing at night, constipation Trouble with steps, couple is moving things downstairs to accommodate worsening communication with wife, increased arguing Wife states pt "uses PD as excuse to not do anything" Pt going to [employer], interested in increasing exercise. Just ordered a bike Discussed mood related sx, let pt know of tx available  Assessment Patient presents with h/o Parkinson's Disease. Pt presents today for behavioral health intervention in the management of chronic sx related to PD, along with supportive counseling for presenting problem of depressive sx. Depressive sx may be exacerbated by by covid pandemic and loss of exercise and social activities as pt describes "Parkinson's has been going downhill with the pandemic".  Spent majority of visit discussing information about our support and educational groups for patients with Parkinson's as well as their care partners, also discussed the importance of forced intense exercise in the management of PD and provided information about current exercise opportunities.  Also discussed availability of individual counseling sessions to address the adjustment of living with chronic disease of Parkinson's, invited patient to schedule with LCSW as desired. Near end of visit, pt discusses concern regarding marital communication patterns and wellbeing of both him and his spouse. LCSW provided supportive counseling and assessment, normalized the experience of difficulty communication as a common experience with PD as this was the topic in our last caregiver education group, will forward PowerPoint to pt wife. Will schedule a f/u in ~3 weeks to further assess  depressed mood and psychosocial factors.    Brief Interventions provided today in session 1. psychoeducation, patient education 2. Supportive counseling   Plan 1. Goal for increased exercise 2. Read information provided re PD Challenges in Speech, Voice, and Communication   Behavioral Health treatment recommendations communicated to referring provider and pt states agreement with plan. LCSW will remain available for future consultation.

## 2019-10-07 ENCOUNTER — Other Ambulatory Visit: Payer: Self-pay

## 2019-10-07 ENCOUNTER — Ambulatory Visit (INDEPENDENT_AMBULATORY_CARE_PROVIDER_SITE_OTHER): Payer: Medicare Other | Admitting: Clinical

## 2019-10-07 DIAGNOSIS — F4321 Adjustment disorder with depressed mood: Secondary | ICD-10-CM | POA: Diagnosis not present

## 2019-10-08 NOTE — BH Specialist Note (Signed)
Referring Provider: Alonza Bogus, DO Date of Referral: 06/22/2019 Primary Reason for Referral: low-mood, depressive sx Location of Visit: pt and wife, virtual visit  Suicide/Homicide Risk: Pt denies risk   Subjective Notes: parkinsons "going downhill with pandemic" per pt report last visit,  Wants to take better care of himself Increased irritability, concern for communication with wife  Ordered a stationary bike, interest in cycling  Assessment Patient presents with h/o Parkinson's Disease. Pt presents today for behavioral health intervention for the management of chronic sx related to PD, along with supportive counseling for presenting problem of depressive sx. Depressive sx may be exacerbated by covid pandemic and loss of exercise and social activities. Pt readiness for change is in active phase demonstrated by note taking, desire for increased engagement with medical supports (neuro rehab, social work, speech therapy, etc) and pt desire for increased exercise.  LCSW provided patient education re multi-disciplinary model of PD care as pt had questions.  LCSW follow-up on last visit and pt regarding marital communication patterns and wellbeing of both him and his spouse. LCSW provided supportive counseling and skill-building on assertive communication and value of "I-statements" to improve communication. F/u in ~1 weeks to further assess compulsive behaviors.    Brief Interventions provided today in session 1. psychoeducation, patient education 2. Supportive counseling    Plan 1. Improve assertive communication by read & practice "I vs you" statement to address communication patterns, 3 resources on assertive communication provided to pt & wife 2. Consider personal goals re compulsive behaviors  Behavioral Health treatment recommendations communicated to referring provider and pt states agreement with plan. LCSW will remain available for future consultation.

## 2019-10-14 ENCOUNTER — Ambulatory Visit (INDEPENDENT_AMBULATORY_CARE_PROVIDER_SITE_OTHER): Payer: Medicare Other | Admitting: Clinical

## 2019-10-14 ENCOUNTER — Other Ambulatory Visit: Payer: Self-pay

## 2019-10-14 DIAGNOSIS — F4321 Adjustment disorder with depressed mood: Secondary | ICD-10-CM

## 2019-11-02 NOTE — BH Specialist Note (Signed)
Referring Provider: [name], DO Date of Referral: 06/22/2019 Primary Reason for Referral: low-mood, depressive sx Location of Visit: individual, virtual visit  Suicide/Homicide Risk: Pt denies risk  Subjective Notes: concern for communication with wife  Wants to improve their relationship before end of life Compulsive beahviors (wearing of women clothing) Ordered a stationary bike, interest in cycling  Assessment Patient presentswith h/o Parkinson's Disease. Pt presents today for behavioral health intervention for the management of chronic sx related to PD, along with supportive counseling for presenting problem of depressive sx and compulsive behavior.Depressive sx may be exacerbated by covid pandemic and loss of exercise and social activities. Pt readiness for change is in active phase demonstrated by note taking, desire for increased engagement with medical supports (neuro rehab, social work, speech therapy, etc) and pt desire for increased exercise.  Pt spent significant time during visit sharing history of transvestism, significant family events and current relationship barriers and intimacy goals for relationship with wife.  LCSW provided supportive counseling and solution-focused treatment approach.    Brief Interventions provided today in session 1. psychoeducation, patient education 2. Supportive counseling   Plan 1.  Consider personal goals re compulsive behaviors-obtaining own clothes so not to disturb wife's 2. Brainstorm boundaries regarding belongings and space  Behavioral Health treatment recommendations communicated to referring provider and pt states agreement with plan. LCSW will remain available for future consultation.

## 2019-11-10 ENCOUNTER — Telehealth: Payer: Self-pay | Admitting: Neurology

## 2019-11-10 NOTE — Telephone Encounter (Signed)
Patient is wanting to know if he qualifies for the COVID Vaccine and he wasn't sure with the Parkinson's Disease if that would help him get it sooner. Thanks!

## 2019-11-10 NOTE — Telephone Encounter (Signed)
Pt called informed that Parkinson Disease does not qualify him to get the COVID Vaccine sooner.

## 2019-11-11 ENCOUNTER — Ambulatory Visit (INDEPENDENT_AMBULATORY_CARE_PROVIDER_SITE_OTHER): Payer: Medicare PPO | Admitting: Clinical

## 2019-11-11 ENCOUNTER — Other Ambulatory Visit: Payer: Self-pay

## 2019-11-11 DIAGNOSIS — F4321 Adjustment disorder with depressed mood: Secondary | ICD-10-CM

## 2019-11-13 NOTE — BH Specialist Note (Signed)
Referring Provider: [name], DO Date of Referral: 06/22/2019 Primary Reason for Referral: low-mood, depressive sx Location of Visit: individual, virtual visit  Suicide/Homicide Risk: Pt denies risk  Subjective Notes: Continued desire for increased communication with wife  Continued Compulsive beahviors (wearing of women clothing), got rid of clothing recently bought  Two recent yelling matches about taking meds, health related tasks Moved computer downstairs in effort to minimize behavior Still going to bible study  Assessment Patient presentswith h/o Parkinson's Disease. Pt presents today for behavioral health intervention for the management of chronic sx related to PD, along with supportive counseling for presenting problem of depressive sx and compulsive behavior.Depressive sx may be exacerbated by covid pandemic and loss of exercise and social activities. Pt readiness for change remains in active phase as pt continues increased engagement with medical supports (neuro rehab, social work, speech therapy, etc) as well as increased participation in exercise and PD support groups. 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 Discussed client's thoughts pertaining to gender identity and gender expression, also with high ambivalence.  Given extent of ambivalence, pt would likely benefit from more frequent out-patient therapy, ideally 1x/wk. LCSW provided referral to [facility] as well as other resources (books, podcast) for personal reflection.   Brief Interventions provided today in session 1. Supportive Counseling 2. Acceptance and Commitment therapy-cognitive defusion  Plan 1.  Make an appt with [facility] 2. Journal conflicting thoughts in preparation for OPT 3. Join Monday PD cycling  class  Behavioral Health treatment recommendations communicated to referring provider and pt states agreement with plan. LCSW will remain available for future consultation.

## 2019-11-17 ENCOUNTER — Ambulatory Visit: Payer: Medicare PPO | Attending: Internal Medicine

## 2019-11-17 DIAGNOSIS — Z23 Encounter for immunization: Secondary | ICD-10-CM | POA: Insufficient documentation

## 2019-11-17 NOTE — Progress Notes (Signed)
   Covid-19 Vaccination Clinic  Name:  CHASIN FINDLING    MRN: [id number] DOB: February 02, 1951  11/17/2019  Mr. Gauger was observed post Covid-19 immunization for 15 minutes without incidence. He was provided with Vaccine Information Sheet and instruction to access the V-Safe system.   Mr. Odowd was instructed to call 911 with any severe reactions post vaccine: Marland Kitchen Difficulty breathing  . Swelling of your face and throat  . A fast heartbeat  . A bad rash all over your body  . Dizziness and weakness    Immunizations Administered    Name Date Dose VIS Date Route   Pfizer COVID-19 Vaccine 11/17/2019 12:30 PM 0.3 mL 10/09/2019 Intramuscular   Manufacturer: [employer], [employer]   Lot: [id number]   NDC: [address]-8

## 2019-11-30 ENCOUNTER — Telehealth: Payer: Self-pay | Admitting: Neurology

## 2019-11-30 ENCOUNTER — Telehealth: Payer: Self-pay

## 2019-11-30 NOTE — Telephone Encounter (Signed)
Error

## 2019-11-30 NOTE — Telephone Encounter (Signed)
You can call [employer] and give the dose of Stalevo that he is on.  This is the same dose that he has been on.  His wife distributes his medication.  It is more unsafe for him not to be on the medication (falls, hip fracture) than for him to receive the appropriate quantity when his wife is the one distributing it to him

## 2019-11-30 NOTE — Telephone Encounter (Signed)
Patient's wife called in regarding his Carbidopa Levodopa medication. He received a letter from [facility] stating that they will not cover full amount. They need a Quantity limit exception for safety measures. Please Call [employer] [phone]. Thank you

## 2019-11-30 NOTE — Telephone Encounter (Signed)
Patient is aware 

## 2019-11-30 NOTE — Telephone Encounter (Signed)
The number given for [employer] below is incorrect. The letter the patient received stated that he can only be given 240 tablets per 30 days. How would you like to precede   [employer] call back [phone] ID [id number] RxGrp [id number]

## 2019-11-30 NOTE — Telephone Encounter (Signed)
Approval received and is valid until 10/28/20  Patient is aware.  Sent to scan

## 2019-11-30 NOTE — Telephone Encounter (Signed)
Submitted a prior authorization over the phone to [place] with [employer]. This was for quantity override. She stated that I will get a response in 24-48 hours.  [id number]

## 2019-12-08 ENCOUNTER — Ambulatory Visit: Payer: Medicare PPO | Attending: Internal Medicine

## 2019-12-08 DIAGNOSIS — Z23 Encounter for immunization: Secondary | ICD-10-CM

## 2019-12-08 NOTE — Progress Notes (Signed)
   Covid-19 Vaccination Clinic  Name:  Zachary [name]    MRN: [id number] DOB: 1951-04-14  12/08/2019  Zachary [name] was observed post Covid-19 immunization for 15 minutes without incidence. He was provided with Vaccine Information Sheet and instruction to access the V-Safe system.   Zachary [name] was instructed to call 911 with any severe reactions post vaccine: Marland Kitchen Difficulty breathing  . Swelling of your face and throat  . A fast heartbeat  . A bad rash all over your body  . Dizziness and weakness    Immunizations Administered    Name Date Dose VIS Date Route   Pfizer COVID-19 Vaccine 12/08/2019 12:55 PM 0.3 mL 10/09/2019 Intramuscular   Manufacturer: [employer], [employer]   Lot: [id number]   NDC: [address]-9

## 2019-12-13 ENCOUNTER — Ambulatory Visit: Payer: Medicare PPO

## 2019-12-14 ENCOUNTER — Other Ambulatory Visit: Payer: Self-pay | Admitting: Neurology

## 2019-12-22 NOTE — Progress Notes (Addendum)
Zachary [name] was seen today in follow up for Parkinsons disease.  My medical records, as well as outside records that were made available to me were reviewed.  We increased his Stalevo last visit.  Wife states that it did help.  We discontinued the amantadine.  Told patient to retry the [place].  Patient reports today that he has trouble taking it and he "blows it all over the place."   Pt denies falls - wife states that he has "stopped having falls" but he did have a near fall the other day.  Pt denies lightheadedness, near syncope.  No hallucinations.  Mood has been good.  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  He reports that it worked at first and then "it didn't help at all."  When pharmacy told them that it was addictive, they stopped it.  His friend told him to take melatonin and that helped the sleep.  Has not had any significant dreams associated with RBD.  He is just now getting restarted with exercise.  Wife states that they "lost ground" when he could not exercise."    Current prescribed movement disorder medications: Stalevo 150 (increased from Stalevo 100), 2 tablets at 6:30 AM/2 tablets at 9 AM/1 tablet at 11:30 AM/2 tablets at 2 PM/1 tablet at 4:30 PM/1 tablet at 7 PM Carbidopa/levodopa 50/200 at bedtime Inbrija (has trouble using it) Clonazepam, 0.5 mg, half tablet at night (started last visit)  PREVIOUS MEDICATIONS: amantadine; Azilect; Inbrija; carbidopa/levodopa 25/100; carbidopa/levodopa 50/200; Stalevo  ALLERGIES:   Allergies  Allergen Reactions  . Tree Extract     CURRENT MEDICATIONS:  Outpatient Encounter Medications as of 12/24/2019  Medication Sig  . carbidopa-levodopa (SINEMET CR) 50-200 MG tablet Take 1 tablet by mouth at bedtime.  . carbidopa-levodopa-entacapone (STALEVO) 37.5-150-200 MG tablet TAKE 2 TABS AT 6:30AM, 2 TAB 9AM, 1 TAB 11:30AM, 2 AT 2PM, 1 TAB AT 4:30PM, AND 7PM.  . PROAIR HFA 108 (90 Base) MCG/ACT inhaler Inhale  2 puffs into the lungs every 4 (four) hours as needed.   Marland Kitchen UNABLE TO FIND Hemp drops PRN during the day  . [DISCONTINUED] amantadine (SYMMETREL) 100 MG capsule TAKE 1 CAPSULE DAILY. (Patient not taking: Reported on 12/24/2019)  . [DISCONTINUED] clonazePAM (KLONOPIN) 0.5 MG tablet Take 0.5 tablets (0.25 mg total) by mouth at bedtime. (Patient not taking: Reported on 12/24/2019)   No facility-administered encounter medications on file as of 12/24/2019.    PHYSICAL EXAMINATION:    VITALS:   Vitals:   12/24/19 1053  BP: 106/63  Pulse: 70  SpO2: 97%  Weight: 176 lb (79.8 kg)  Height: 5' 9.5" (1.765 m)    GEN:  The patient appears stated age and is in NAD. HEENT:  Normocephalic, atraumatic.  The mucous membranes are moist. The superficial temporal arteries are without ropiness or tenderness. CV:  RRR Lungs:  CTAB Neck/HEME:  There are no carotid bruits bilaterally.  Neurological examination:  Orientation: The patient is alert and oriented x3. Cranial nerves: There is good facial symmetry with facial hypomimia. The speech is fluent and clear. Soft palate rises symmetrically and there is no tongue deviation. Hearing is intact to conversational tone. Sensation: Sensation is intact to light touch throughout Motor: Strength is at least antigravity x4.  Movement examination: Tone: There is mod increased tone in the bilateral UE Abnormal movements: there is R leg dyskinesia Coordination:  There is  decremation with RAM's, with hand opening and closing and toe  taps. Gait and Station: The patient has no difficulty arising out of a deep-seated chair without the use of the hands. The patient's stride length is very good (showing off) with very good arm swing.     ASSESSMENT/PLAN:  1.  Parkinson's disease  -Continue Stalevo 150, 2 tablets at 6:30 AM/2 tablets at 9 AM/1 tablet at 11:30 AM/2 tablets at 2 PM/1 tablet at 4:30 PM/1 tablet at 7 PM  -Continue carbidopa/levodopa 50/200 at  bedtime  -PT referral  -RX transport chair given  -Importance of exercise discussed. 2.  RBD  -they d/c the klonopin as pharmacist told them addictive.  Was only on 0.25 mg daily.  May need to address again.    -has bedrails.    - We discussed that this can be very serious and even harmful.    We talked about moving the night stand so that it is not so close to the side of the bed.   3.  [facility]  -Last neurocognitive testing in January, 2019.  No evidence of dementia  Total time spent on today's visit was 40 minutes, including both face-to-face time and nonface-to-face time.  Time included that spent on review of records (prior notes available to me/labs/imaging if pertinent), discussing treatment and goals, answering patient's questions and coordinating care.  Cc:  Martha Clan, MD

## 2019-12-24 ENCOUNTER — Ambulatory Visit: Payer: Medicare PPO | Admitting: Neurology

## 2019-12-24 ENCOUNTER — Other Ambulatory Visit: Payer: Self-pay

## 2019-12-24 ENCOUNTER — Encounter: Payer: Self-pay | Admitting: Neurology

## 2019-12-24 VITALS — BP 106/63 | HR 70 | Ht 69.5 in | Wt 176.0 lb

## 2019-12-24 DIAGNOSIS — G4752 REM sleep behavior disorder: Secondary | ICD-10-CM

## 2019-12-24 DIAGNOSIS — G3184 Mild cognitive impairment, so stated: Secondary | ICD-10-CM

## 2019-12-24 DIAGNOSIS — G20 Parkinson's disease: Secondary | ICD-10-CM

## 2019-12-24 DIAGNOSIS — G249 Dystonia, unspecified: Secondary | ICD-10-CM

## 2019-12-24 MED ORDER — AMBULATORY NON FORMULARY MEDICATION: 0 refills | Status: AC

## 2019-12-24 NOTE — Patient Instructions (Addendum)
You have been referred to [facility] for therapy. They will call you directly to schedule an appointment.  Please call [phone] if you do not hear from them.   The physicians and staff at [facility] are committed to providing excellent care. You may receive a survey requesting feedback about your experience at our office. We strive to receive "very good" responses to the survey questions. If you feel that your experience would prevent you from giving the office a "very good " response, please contact our office to try to remedy the situation. We may be reached at [phone]. Thank you for taking the time out of your busy day to complete the survey.  

## 2020-01-04 ENCOUNTER — Encounter: Payer: Self-pay | Admitting: Neurology

## 2020-01-05 ENCOUNTER — Telehealth: Payer: Self-pay

## 2020-01-05 NOTE — Telephone Encounter (Signed)
Left message for patient to contact the office.  Mychart message has been sent.

## 2020-01-11 ENCOUNTER — Other Ambulatory Visit: Payer: Self-pay | Admitting: Neurology

## 2020-01-14 ENCOUNTER — Telehealth: Payer: Self-pay | Admitting: Neurology

## 2020-01-14 NOTE — Telephone Encounter (Signed)
Spoke with patient and informed him that Dr [name] recommended that he pay for the week chair out of pocket and use his insurance for bigger expenses such as electric wheel chair. Patient voiced understanding Patient stated he did not receive my voicemail I left on his home voicemail and he has not been on mychart in a long time so he did not see my message. Patients wife had questions about insurance. I advised her to contact the patients insurance company about deductibles. She voiced understanding.

## 2020-01-14 NOTE — Telephone Encounter (Signed)
Patient states that the home health place has been trying to get information from our office about a wheelchair. They would like to speak to someone in our office

## 2020-01-15 ENCOUNTER — Telehealth: Payer: Self-pay | Admitting: Neurology

## 2020-01-15 NOTE — Telephone Encounter (Signed)
Are we talking about the stalevo or the carbidopa/levodopa 50/200?

## 2020-01-15 NOTE — Telephone Encounter (Signed)
New schedule written down for Tee to change to carbidopa/levodopa 25/100 and entacapone separately

## 2020-01-15 NOTE — Telephone Encounter (Signed)
Pharm called in to see if we received a fax from them about the patient's carbidopa levodopa medication. The medication is out of order on the dosage patient usually takes. She said to call back and ask for [place]. She needs to know which to replace it with. Thanks!

## 2020-01-15 NOTE — Telephone Encounter (Signed)
Spoke with Zachary [name] and gave her Dr Don Perking recommendations: Carbidopa Levodopa 25/100  3 tabs at 6:30am 3 tabs at 9 1.5 tabs at 11:30am 3 tabs at 2pm 1.5 tabs at 4:30pm 1.5 tabs at 7   Entacapone 200mg  six times a day-take 1 tablet with each carbidopa levodopa  Pharmacy given verbal orders. But stated if patient   pharmacist stated patient could check a chain pharmacy to see if they could get the rx of have the rx on hand; but their supply was out at the warehouse.   Spoke with patient and they are interested in checking with the large chain pharmacies to see if they can find it someone else. The patient is not interested in taking so many tablets a day.   Advised patients wife to contact a few pharmacies and give me callback as to where to send the rx. Wife voiced understanding.

## 2020-01-15 NOTE — Telephone Encounter (Signed)
Spoke with Rayfield Citizen the pharmacist at [facility] and she states its the Stalevo.

## 2020-01-18 MED ORDER — CARBIDOPA-LEVODOPA-ENTACAPONE 37.5-150-200 MG PO TABS: ORAL_TABLET | ORAL | 0 refills | Status: DC

## 2020-01-18 NOTE — Telephone Encounter (Signed)
Spoke with patients wife and she stated she called [facility] of [employer] at [address] and [address] was told they have the rx in stock. Patients wife also requested that we cancel the rx at [facility] for now.    Advised her that I would [facility] she voiced understanding and thanked me for calling.   Rx(s) sent to pharmacy electronically.  Contacted [facility] to cancel rx per patients wife request. Pharmacists voiced understanding and will cancel separate rx's  carbidopa-levodopa and entacapone and hold the rx for Stalevo in case the warehouse gets the rx in stock.

## 2020-01-20 ENCOUNTER — Telehealth: Payer: Self-pay | Admitting: Neurology

## 2020-01-20 NOTE — Telephone Encounter (Signed)
See prior message.  I believe we previously tried to call them and they didn't return call.  Basically, patient doesn't meet insurance criteria

## 2020-01-20 NOTE — Telephone Encounter (Signed)
Spoke with Elnita Maxwell at [facility] and she stated the patient wants to use insurance to pay for chair. Informed Elnita Maxwell that Dr Don Perking office does not meet criteria for patient to get chair and Dr [name] is not going to commit fraud by stating things that are not true. She voiced understanding and stated the patient may have to pay out of pocket for chair or the order will be closed.

## 2020-01-20 NOTE — Telephone Encounter (Signed)
Zachary [name] was calling in about the orders for the chair for patient. She said she needed a call back. Thanks!

## 2020-02-17 ENCOUNTER — Encounter: Payer: Self-pay | Admitting: Physical Therapy

## 2020-02-17 ENCOUNTER — Other Ambulatory Visit: Payer: Self-pay

## 2020-02-17 ENCOUNTER — Ambulatory Visit: Payer: Medicare PPO | Attending: Neurology | Admitting: Physical Therapy

## 2020-02-17 DIAGNOSIS — R2681 Unsteadiness on feet: Secondary | ICD-10-CM

## 2020-02-17 DIAGNOSIS — R29818 Other symptoms and signs involving the nervous system: Secondary | ICD-10-CM

## 2020-02-17 DIAGNOSIS — R293 Abnormal posture: Secondary | ICD-10-CM

## 2020-02-17 DIAGNOSIS — R2689 Other abnormalities of gait and mobility: Secondary | ICD-10-CM | POA: Diagnosis not present

## 2020-02-18 NOTE — Therapy (Signed)
[facility] [address] [place], [place], [address] Phone: [phone]   Fax:  [phone]  Physical Therapy Evaluation  Patient Details  Name: Zachary [name] MRN: [id number] Date of Birth: 03/15/1951 Referring Provider (PT): [name], Lurena Joiner    Encounter Date: 02/17/2020  PT End of Session - 02/18/20 1231    Visit Number  1    Number of Visits  9    Date for PT Re-Evaluation  05/18/20   90 day cert for 8 week POC   Authorization Type  [employer] Medicare-auth form completed upon completion of eval    Progress Note Due on Visit  10    PT Start Time  1748    PT Stop Time  1835    PT Time Calculation (min)  47 min    Activity Tolerance  Patient tolerated treatment well    Behavior During Therapy  [facility] for tasks assessed/performed;Impulsive       Past Medical History:  Diagnosis Date  . Allergy   . Asthma   . Parkinson's disease (HCC)    Congenital R fourth nerve palsy    Past Surgical History:  Procedure Laterality Date  . CATARACT EXTRACTION, BILATERAL    . COLONOSCOPY    . SHOULDER SURGERY Left    lymph nodes  . TONSILLECTOMY    . VASECTOMY      There were no vitals filed for this visit.   Subjective Assessment - 02/17/20 1751    Subjective  Feel like I'm actually moving better than I was during the pandemic.  Going to boxing, the gym, playing golf with friends, and have a bike at home that he's been using.  In the last 6 months, pt has fallen some.  No major injuries, though.  Has fallen forward on the steps several times.  At night, he cannot move and has to use (his old transport chair) at night.  He and wife describe frrezing episodes (mostly occur at night, but sometimes during the day), where pt is significantly stuck and unable to initiate movement.    Patient is accompained by:  Family member   Wife   Patient Stated Goals  Pt wants to limit the off-times each day.    Currently in Pain?  No/denies          [facility] PT Assessment - 02/17/20 1800      Assessment   Medical Diagnosis  Parkinson's disease    Referring Provider (PT)  [name], Lurena Joiner     Onset Date/Surgical Date  12/24/19   last MD visit   Hand Dominance  Right      Precautions   Precautions  Fall      Balance Screen   Has the patient fallen in the past 6 months  Yes    How many times?  multiple    Has the patient had a decrease in activity level because of a fear of falling?   No    Is the patient reluctant to leave their home because of a fear of falling?   No      Home Public house manager residence    Living Arrangements  Spouse/significant other    Available Help at Discharge  Family    Type of Home  House    Home Access  Stairs to enter    Entrance Stairs-Number of Steps  4    Entrance Stairs-Rails  Right    Home Layout  Two  level;Able to live on main level with bedroom/bathroom    Alternate Level Stairs-Number of Steps  Normal staircase    Alternate Level Stairs-Rails  Can reach both    Surveyor, mining chair;Shower seat;Cane - single point;Walker - 4 wheels      Prior Function   Level of Independence  Independent    Vocation  Retired    Leisure  Enjoys Naval architect, boxing 2x/wk, gym with trainer, uses bike at home      Observation/Other Assessments   Focus on Therapeutic Outcomes (FOTO)   NA      Posture/Postural Control   Posture/Postural Control  Postural limitations    Postural Limitations  Rounded Shoulders      Tone   Assessment Location  Right Lower Extremity;Left Lower Extremity      ROM / Strength   AROM / PROM / Strength  Strength      Strength   Overall Strength  Within functional limits for tasks performed      Transfers   Transfers  Sit to Stand;Stand to Sit    Sit to Stand  5: Supervision;Without upper extremity assist;From chair/3-in-1    Five time sit to stand comments   6.13    Stand to Sit  5: Supervision;Without upper extremity assist;To  chair/3-in-1    Comments  Pt very fast with sit<>stand, pushes with legs against chair, decreased forward lean to stand (mostly uses momentum)      Ambulation/Gait   Ambulation/Gait  Yes    Ambulation/Gait Assistance  5: Supervision;4: Min guard    Ambulation Distance (Feet)  150 Feet    Assistive device  None    Gait Pattern  Step-through pattern;Decreased arm swing - right;Decreased arm swing - left;Trunk flexed;Narrow base of support;Poor foot clearance - left;Poor foot clearance - right    Ambulation Surface  Level;Indoor    Gait velocity  9.41 sec = 3.49 ft/sec    Gait Comments  Pt very fast with gait activities, needs cues for directions of gait/balance testing, cues to slow pace at times.      Standardized Balance Assessment   Standardized Balance Assessment  Mini-BESTest;Timed Up and Go Test      Mini-BESTest   Sit To Stand  Normal: Comes to stand without use of hands and stabilizes independently.    Rise to Toes  Normal: Stable for 3 s with maximum height.    Stand on one leg (left)  Moderate: < 20 s   3.04, 5.03   Stand on one leg (right)  Normal: 20 s.   15.19, 20   Stand on one leg - lowest score  1    Compensatory Stepping Correction - Forward  Moderate: More than one step is required to recover equilibrium    Compensatory Stepping Correction - Backward  No step, OR would fall if not caught, OR falls spontaneously.    Compensatory Stepping Correction - Left Lateral  Severe: Falls, or cannot step    Compensatory Stepping Correction - Right Lateral  Normal: Recovers independently with 1 step (crossover or lateral OK)    Stepping Corredtion Lateral - lowest score  0    Stance - Feet together, eyes open, firm surface   Moderate: < 30s    Stance - Feet together, eyes closed, foam surface   Normal: 30s    Incline - Eyes Closed  Normal: Stands independently 30s and aligns with gravity    Change in Gait Speed  Normal: Significantly changes walkling speed  without imbalance     Walk with head turns - Horizontal  Normal: performs head turns with no change in gait speed and good balance    Walk with pivot turns  Normal: Turns with feet close FAST (< 3 steps) with good balance.    Step over obstacles  Severe: Unable to step over box OR steps around box    Timed UP & GO with Dual Task  Moderate: Dual Task affects either counting OR walking (>10%) when compared to the TUG without Dual Task.    Mini-BEST total score  18      Timed Up and Go Test   TUG  Normal TUG;Manual TUG;Cognitive TUG    Normal TUG (seconds)  7.69    Manual TUG (seconds)  9.25    Cognitive TUG (seconds)  9    TUG Comments  Scores >10% difference indicate difficulty with dual tasking.        RLE Tone   RLE Tone  Within Functional Limits      LLE Tone   LLE Tone  Mild                Objective measurements completed on examination: See above findings.              PT Education - 02/18/20 1230    Education Details  Eval results, POC    Person(s) Educated  Patient;Spouse    Methods  Explanation    Comprehension  Verbalized understanding       PT Short Term Goals - 02/18/20 1246      PT SHORT TERM GOAL #1   Title  Pt will perform HEP with family's supervision, to address pt's Parkinson's related deficits. [facility] for all STGs 03/18/2020 (may be delayed due to delayed scheduling)    Time  4    Period  Weeks    Status  New      PT SHORT TERM GOAL #2   Title  Pt will perform 5 times sit<>stand with no episodes of posterior lean, LOB, for improved transfer safety.    Time  4    Period  Weeks    Status  New      PT SHORT TERM GOAL #3   Title  Pt/wife will verbalize and demonstrate tips to reduce freezing episodes with gait, turns, other mobility.    Time  4    Period  Weeks    Status  New      PT SHORT TERM GOAL #4   Title  Pt will negotiate at least 4 steps, handrail, supervision, demonstrating correct technique for improved safety with stair negotiation.    Time  4     Period  Weeks    Status  New        PT Long Term Goals - 02/18/20 1248      PT LONG TERM GOAL #1   Title  Pt/wife will verbalize understanding of fall prevention in home environment.  ALL LTGS 04/15/2020 (may be delayed due to delayed initial scheduling)    Time  8    Period  Weeks    Status  New      PT LONG TERM GOAL #2   Title  Pt will perform progression of HEP with wife's supervision for improved balance, gait, posture.    Time  8    Period  Weeks    Status  New      PT LONG TERM GOAL #3   Title  Pt will improve MiniBESTest score to at least 21/28 for decreased fall risk.    Time  8    Period  Weeks    Status  New      PT LONG TERM GOAL #4   Title  Pt/wife will demonstrate/verbalize understanding of floor to stand transfer technique, for improved fall recovery.    Time  8    Period  Weeks    Status  New             Plan - 02/18/20 1232    Clinical Impression Statement  Pt is a 69 year old male who presents to OPPT with history of Parkinson's disease, with at least 3 falls in the past 6 months.  Pt concerned about off-times of medication, which lead to significant freezing episodes, but are unpredictable.  Wife and patient concerned about reported "manic" episodes, where he speeds up and moves very quickly, to the point of being unsafe.  Pt presents to OPPT with decreased timing and coordination of gait, decreased balance, impulsivity/decreased safety awareness with movement patterns.  He appears to be moving better as compared to objective measures from bout of therapy summer 2020; however, he will benefit from course of therapy at this time to work on sustained, controlled movement patterns, decreased fall risk, and strategies to move during significant "off" times.    Personal Factors and Comorbidities  Comorbidity 2    Comorbidities  Allergies, Asthma    Examination-Activity Limitations  Locomotion Level;Transfers;Stand;Stairs    Examination-Participation  Restrictions  Community Activity;Other   return to PD exercise activities   Stability/Clinical Decision Making  Stable/Uncomplicated    Clinical Decision Making  Low    Rehab Potential  Good    PT Frequency  1x / week    PT Duration  8 weeks   plus eval   PT Treatment/Interventions  ADLs/Self Care Home Management;Gait training;Stair training;Functional mobility training;Therapeutic activities;Therapeutic exercise;Balance training;Neuromuscular re-education;DME Instruction;Patient/family education    PT Next Visit Plan  PWR! Moves initiated for seated, standing, quadruped positions-focus on slow, sustained movement patterns; techniques/strategies to initiate movement in "off times"    Consulted and Agree with Plan of Care  Patient;Family member/caregiver    Family Member Consulted  wife       Patient will benefit from skilled therapeutic intervention in order to improve the following deficits and impairments:  Abnormal gait, Difficulty walking, Decreased safety awareness, Decreased balance, Postural dysfunction, Decreased mobility  Visit Diagnosis: Other abnormalities of gait and mobility  Unsteadiness on feet  Abnormal posture  Other symptoms and signs involving the nervous system     Problem List Patient Active Problem List   Diagnosis Date Noted  . Parkinson's disease ([place]) 05/04/2013    Elysia Grand W. 02/18/2020, 12:53 PM Frazier Butt., PT  [facility] [address] [place], [place], [address] Phone: [phone]   Fax:  [phone]  Name: Zachary [name] MRN: [id number] Date of Birth: [birth year]

## 2020-02-23 ENCOUNTER — Other Ambulatory Visit: Payer: Self-pay

## 2020-02-23 ENCOUNTER — Ambulatory Visit: Payer: Medicare PPO | Admitting: Physical Therapy

## 2020-02-23 ENCOUNTER — Encounter: Payer: Self-pay | Admitting: Physical Therapy

## 2020-02-23 DIAGNOSIS — R293 Abnormal posture: Secondary | ICD-10-CM

## 2020-02-23 DIAGNOSIS — R2689 Other abnormalities of gait and mobility: Secondary | ICD-10-CM | POA: Diagnosis not present

## 2020-02-23 DIAGNOSIS — R2681 Unsteadiness on feet: Secondary | ICD-10-CM

## 2020-02-23 NOTE — Patient Instructions (Signed)
    PERFORM FIRST 3-5 REPS SLOWLY AND SUSTAINED PERFORM NEXT 5-10 REPS WITH BIG MOVEMENTS, INTENTIONAL PATTERNS (EFFORT LEVEL 6-8/10) Perform these exercises once daily

## 2020-02-23 NOTE — Therapy (Signed)
[facility] [address] [place], [place], [address] Phone: [phone]   Fax:  [phone]  Physical Therapy Treatment  Patient Details  Name: Zachary [name] MRN: [id number] Date of Birth: [birth year] Referring Provider (PT): [name], Lurena Joiner    Encounter Date: 02/23/2020  PT End of Session - 02/23/20 1458    Visit Number  2    Number of Visits  9    Date for PT Re-Evaluation  05/18/20   90 day cert for 8 week POC   Authorization Type  [employer] Medicare-auth form completed upon completion of eval    Progress Note Due on Visit  10    PT Start Time  1019    PT Stop Time  1100    PT Time Calculation (min)  41 min    Activity Tolerance  Patient tolerated treatment well    Behavior During Therapy  [facility] for tasks assessed/performed;Impulsive       Past Medical History:  Diagnosis Date  . Allergy   . Asthma   . Parkinson's disease (HCC)    Congenital R fourth nerve palsy    Past Surgical History:  Procedure Laterality Date  . CATARACT EXTRACTION, BILATERAL    . COLONOSCOPY    . SHOULDER SURGERY Left    lymph nodes  . TONSILLECTOMY    . VASECTOMY      There were no vitals filed for this visit.  Subjective Assessment - 02/23/20 1022    Subjective  Feel like I'm in the middle of a "crash" right now.  Walking becomes affected and feel intense pressure in leg when sitting in car.  "Crash" time often lasts 20 minutes to 11/2 hours.    Patient is accompained by:  Family member   Wife   Patient Stated Goals  Pt wants to limit the off-times each day.    Currently in Pain?  No/denies          Neuro Re-education  Pt performs PWR! Moves in seated position, initial 3 reps SLOW and SUSTAINED movement patterns; following 5-10 reps with BIG, INTENSE movement patterns   PWR! Up for improved posture  PWR! Rock for improved weighshifting  PWR! Twist for improved trunk rotation   PWR! Step for improved step initiation  -single step out/in; then double step out/out, in/in  Verbal, visual cues provided for technique, intensity and posture, hold time at end ranges.      Pt performs PWR! Moves in standing position, initial 3 reps SLOW and SUSTAINED movement patterns; following 5-10 reps with BIG, INTENSE movement patterns   PWR! Up for improved posture  PWR! Rock for improved weighshifting  PWR! Twist for improved trunk rotation   PWR! Step for improved step initiation -single step out/in; then double step out/out, in/in  Verbal, visual cues provided for technique, intensity and posture, hold time at end ranges.             [facility] Adult PT Treatment/Exercise - 02/23/20 0001      Ambulation/Gait   Ambulation/Gait  Yes    Ambulation/Gait Assistance  5: Supervision;4: Min guard    Ambulation Distance (Feet)  230 Feet   x 2 reps no device; 80 ft x 2 with rollator   Assistive device  None;Rollator    Gait Pattern  Step-through pattern;Decreased arm swing - right;Decreased arm swing - left;Trunk flexed;Narrow base of support;Poor foot clearance - left;Poor foot clearance - right    Ambulation Surface  Level;Indoor  Gait Comments  Pt initially comes into therapy with rollator, as he reports this is an off-time; needs cues to stay close to rollator and for increased step length/foot clearance.  During PT session, after standing PWR! Moves exercises, pt is able to ambulate without assistive device, cues initially for improved arm swing and step length.              PT Education - 02/23/20 1457    Education Details  Initiated HEP-seated and standing PWR! Moves (during quick movement -pt reported "manic" times, PT recommends SLOW, SUSTAINED PWR! Moves, during freezing episodes/off times BIG, INTENSE MOVEMENTS)    Person(s) Educated  Patient    Methods  Explanation;Demonstration;Handout;Verbal cues    Comprehension  Verbalized understanding;Returned demonstration       PT Short Term Goals  - 02/18/20 1246      PT SHORT TERM GOAL #1   Title  Pt will perform HEP with family's supervision, to address pt's Parkinson's related deficits. [facility] for all STGs 03/18/2020 (may be delayed due to delayed scheduling)    Time  4    Period  Weeks    Status  New      PT SHORT TERM GOAL #2   Title  Pt will perform 5 times sit<>stand with no episodes of posterior lean, LOB, for improved transfer safety.    Time  4    Period  Weeks    Status  New      PT SHORT TERM GOAL #3   Title  Pt/wife will verbalize and demonstrate tips to reduce freezing episodes with gait, turns, other mobility.    Time  4    Period  Weeks    Status  New      PT SHORT TERM GOAL #4   Title  Pt will negotiate at least 4 steps, handrail, supervision, demonstrating correct technique for improved safety with stair negotiation.    Time  4    Period  Weeks    Status  New        PT Long Term Goals - 02/18/20 1248      PT LONG TERM GOAL #1   Title  Pt/wife will verbalize understanding of fall prevention in home environment.  ALL LTGS 04/15/2020 (may be delayed due to delayed initial scheduling)    Time  8    Period  Weeks    Status  New      PT LONG TERM GOAL #2   Title  Pt will perform progression of HEP with wife's supervision for improved balance, gait, posture.    Time  8    Period  Weeks    Status  New      PT LONG TERM GOAL #3   Title  Pt will improve MiniBESTest score to at least 21/28 for decreased fall risk.    Time  8    Period  Weeks    Status  New      PT LONG TERM GOAL #4   Title  Pt/wife will demonstrate/verbalize understanding of floor to stand transfer technique, for improved fall recovery.    Time  8    Period  Weeks    Status  New            Plan - 02/23/20 1459    Clinical Impression Statement  Initiated HEP this visit, for seated and standing PWR! Moves, with particular attention paid to initial moves in each set to be SLOW and SUSTAINED, as pt would  perform movement patterns  when he is in more of a hastening movement state; also paid particular attention to BIG, INTENSE movements for additional reps, as pt would need to move in "off" times.  Despite the pt reporting an off-time at beginning of therapy, he feels he is moving better overal throughout session and after hte exercises.  Continues to need cues for safety with gait and will need repetition for improved carryover of exercises to funcitonal mobility.    Personal Factors and Comorbidities  Comorbidity 2    Comorbidities  Allergies, Asthma    Examination-Activity Limitations  Locomotion Level;Transfers;Stand;Stairs    Examination-Participation Restrictions  Community Activity;Other   return to PD exercise activities   Stability/Clinical Decision Making  Stable/Uncomplicated    Rehab Potential  Good    PT Frequency  1x / week    PT Duration  8 weeks   plus eval   PT Treatment/Interventions  ADLs/Self Care Home Management;Gait training;Stair training;Functional mobility training;Therapeutic activities;Therapeutic exercise;Balance training;Neuromuscular re-education;DME Instruction;Patient/family education    PT Next Visit Plan  Review PWR! Moves initiated for seated, standing; try quadruped positions-focus on slow, sustained movement patterns; techniques/strategies to initiate movement in "off times"; gait with start/stops, changes of direction, multi-directional stepping    Consulted and Agree with Plan of Care  Patient       Patient will benefit from skilled therapeutic intervention in order to improve the following deficits and impairments:  Abnormal gait, Difficulty walking, Decreased safety awareness, Decreased balance, Postural dysfunction, Decreased mobility  Visit Diagnosis: Other abnormalities of gait and mobility  Unsteadiness on feet  Abnormal posture     Problem List Patient Active Problem List   Diagnosis Date Noted  . Parkinson's disease (HCC) 05/04/2013    Amilee Janvier W. 02/23/2020,  3:02 PM  Gean Maidens., PT   [facility] [address] [place], [place], [address] Phone: [phone]   Fax:  [phone]  Name: Zachary [name] MRN: [id number] Date of Birth: 07/05/1951

## 2020-03-04 ENCOUNTER — Ambulatory Visit: Payer: Medicare PPO | Admitting: Physical Therapy

## 2020-03-09 ENCOUNTER — Other Ambulatory Visit: Payer: Self-pay

## 2020-03-09 ENCOUNTER — Ambulatory Visit: Payer: Medicare PPO | Attending: Neurology | Admitting: Physical Therapy

## 2020-03-09 DIAGNOSIS — R29818 Other symptoms and signs involving the nervous system: Secondary | ICD-10-CM | POA: Diagnosis present

## 2020-03-09 DIAGNOSIS — R293 Abnormal posture: Secondary | ICD-10-CM | POA: Insufficient documentation

## 2020-03-09 DIAGNOSIS — R2681 Unsteadiness on feet: Secondary | ICD-10-CM | POA: Insufficient documentation

## 2020-03-09 DIAGNOSIS — R2689 Other abnormalities of gait and mobility: Secondary | ICD-10-CM | POA: Insufficient documentation

## 2020-03-09 NOTE — Patient Instructions (Signed)
Access Code: [email or web address] URL: [email or web address] Date: 03/09/2020 Prepared by: Lonia Blood  Exercises Stride Stance Weight Shift - 1 x daily - 5 x weekly - 1-2 sets - 10 reps Standing Lumbar Spine Flexion Stretch Counter - 1 x daily - 7 x weekly - 1 sets - 5-10 reps

## 2020-03-09 NOTE — Therapy (Signed)
[facility] [address] [place], [place], [address] Phone: [phone]   Fax:  [phone]  Physical Therapy Treatment  Patient Details  Name: Zachary [name] MRN: [id number] Date of Birth: [birth year] Referring Provider (PT): [name], Lurena Joiner    Encounter Date: 03/09/2020  PT End of Session - 03/09/20 1835    Visit Number  3    Number of Visits  9    Date for PT Re-Evaluation  05/18/20   90 day cert for 8 week POC   Authorization Type  [employer]-auth form completed upon completion of eval    Progress Note Due on Visit  10    PT Start Time  1749    PT Stop Time  1829    PT Time Calculation (min)  40 min    Activity Tolerance  Patient tolerated treatment well    Behavior During Therapy  [facility] for tasks assessed/performed       Past Medical History:  Diagnosis Date  . Allergy   . Asthma   . Parkinson's disease (HCC)    Congenital R fourth nerve palsy    Past Surgical History:  Procedure Laterality Date  . CATARACT EXTRACTION, BILATERAL    . COLONOSCOPY    . SHOULDER SURGERY Left    lymph nodes  . TONSILLECTOMY    . VASECTOMY      There were no vitals filed for this visit.  Subjective Assessment - 03/09/20 1751    Subjective  [place] yesterday [place] golf [place]-trying to go down the hill-got moving to fast on my toes, and couldn't stop.  Did not get hurt and I did get up on my own.    Patient is accompained by:  Family member   Wife   Patient Stated Goals  Pt wants to limit the off-times each day.    Currently in Pain?  No/denies                        [facility] Adult PT Treatment/Exercise - 03/09/20 1836      Ambulation/Gait   Ambulation/Gait  Yes    Ambulation/Gait Assistance  5: Supervision;4: Min guard    Ambulation/Gait Assistance Details  Cues to slow pace, maintain heelstrike with gait    Ambulation Distance (Feet)  230 Feet   200, 150 ft gym to outdoors   Assistive device  None     Gait Pattern  Step-through pattern;Decreased arm swing - right;Decreased arm swing - left;Trunk flexed;Narrow base of support;Poor foot clearance - left;Poor foot clearance - right    Ambulation Surface  Level;Indoor    Gait Comments  Gait activities with start/stopping, to rock to start again, quick stops and changes of directions, practice with leading leg to initiate turn to that direction, R and L at least 3 reps each.        Neuro Re-education: Neuro Re-education-Reviewed HEP provided last visit.     Pt performs PWR! Moves in seated position, initial 3 reps SLOW and SUSTAINED movement patterns; following 5-10 reps with BIG, INTENSE movement patterns   PWR! Up for improved posture   PWR! Rock for improved weighshifting   PWR! Twist for improved trunk rotation    PWR! Step for improved step initiation -single step out/in; then double step out/out, in/in   Verbal, visual cues provided for technique, intensity and posture, hold time at end ranges.        Pt performs PWR! Moves in standing  position, initial 3 reps SLOW and SUSTAINED movement patterns; following 5-10 reps with BIG, INTENSE movement patterns   PWR! Up for improved posture   PWR! Rock for improved weighshifting   PWR! Twist for improved trunk rotation    PWR! Step for improved step initiation -single step out/in; then progressed to side step out/diagonal to help with initiation of turns  Verbal, visual cues provided for technique, intensity and posture, hold time at end ranges.    Tried back step and weigthshift x 5 reps, then forward/back step and weightshift x 10 reps with UE support.   Heel/toe raises x 10 reps      PT Education - 03/09/20 1835    Education Details  Additions to HEP-see instructions    Person(s) Educated  Patient    Methods  Explanation;Demonstration;Verbal cues;Handout    Comprehension  Verbalized understanding;Returned demonstration       PT Short Term Goals - 02/18/20 1246       PT SHORT TERM GOAL #1   Title  Pt will perform HEP with family's supervision, to address pt's Parkinson's related deficits. [facility] for all STGs 03/18/2020 (may be delayed due to delayed scheduling)    Time  4    Period  Weeks    Status  New      PT SHORT TERM GOAL #2   Title  Pt will perform 5 times sit<>stand with no episodes of posterior lean, LOB, for improved transfer safety.    Time  4    Period  Weeks    Status  New      PT SHORT TERM GOAL #3   Title  Pt/wife will verbalize and demonstrate tips to reduce freezing episodes with gait, turns, other mobility.    Time  4    Period  Weeks    Status  New      PT SHORT TERM GOAL #4   Title  Pt will negotiate at least 4 steps, handrail, supervision, demonstrating correct technique for improved safety with stair negotiation.    Time  4    Period  Weeks    Status  New        PT Long Term Goals - 02/18/20 1248      PT LONG TERM GOAL #1   Title  Pt/wife will verbalize understanding of fall prevention in home environment.  ALL LTGS 04/15/2020 (may be delayed due to delayed initial scheduling)    Time  8    Period  Weeks    Status  New      PT LONG TERM GOAL #2   Title  Pt will perform progression of HEP with wife's supervision for improved balance, gait, posture.    Time  8    Period  Weeks    Status  New      PT LONG TERM GOAL #3   Title  Pt will improve MiniBESTest score to at least 21/28 for decreased fall risk.    Time  8    Period  Weeks    Status  New      PT LONG TERM GOAL #4   Title  Pt/wife will demonstrate/verbalize understanding of floor to stand transfer technique, for improved fall recovery.    Time  8    Period  Weeks    Status  New            Plan - 03/09/20 1838    Clinical Impression Statement  Reviewed HEP this visit, with requiring cues  at times to slow pace and for brief hold at end ranges.  Worked on addressing pt's reports of up on toes (festinating) upon initiating gait and with turns;  added to HEP to address this.  Through session, he starts with good pace and by end of session needs slightly more cues for slowed pace.    Personal Factors and Comorbidities  Comorbidity 2    Comorbidities  Allergies, Asthma    Examination-Activity Limitations  Locomotion Level;Transfers;Stand;Stairs    Examination-Participation Restrictions  Community Activity;Other   return to PD exercise activities   Stability/Clinical Decision Making  Stable/Uncomplicated    Rehab Potential  Good    PT Frequency  1x / week    PT Duration  8 weeks   plus eval   PT Treatment/Interventions  ADLs/Self Care Home Management;Gait training;Stair training;Functional mobility training;Therapeutic activities;Therapeutic exercise;Balance training;Neuromuscular re-education;DME Instruction;Patient/family education    PT Next Visit Plan  Review addition to HEP; try quadruped positions-focus on slow, sustained movement patterns; techniques/strategies to initiate movement in "off times"; gait with start/stops, changes of direction, multi-directional stepping    Consulted and Agree with Plan of Care  Patient       Patient will benefit from skilled therapeutic intervention in order to improve the following deficits and impairments:  Abnormal gait, Difficulty walking, Decreased safety awareness, Decreased balance, Postural dysfunction, Decreased mobility  Visit Diagnosis: Unsteadiness on feet  Other abnormalities of gait and mobility  Other symptoms and signs involving the nervous system     Problem List Patient Active Problem List   Diagnosis Date Noted  . Parkinson's disease (HCC) 05/04/2013    Macalister Arnaud W. 03/09/2020, 6:41 PM  Gean Maidens., PT   [facility] [address] [place], [place], [address] Phone: [phone]   Fax:  [phone]  Name: Zachary [name] MRN: [id number] Date of Birth: 14-Jun-1951

## 2020-03-14 ENCOUNTER — Other Ambulatory Visit: Payer: Self-pay | Admitting: Neurology

## 2020-03-14 DIAGNOSIS — G20 Parkinson's disease: Secondary | ICD-10-CM

## 2020-03-14 NOTE — Telephone Encounter (Signed)
Rx(s) sent to pharmacy electronically.  

## 2020-03-16 ENCOUNTER — Other Ambulatory Visit: Payer: Self-pay

## 2020-03-16 ENCOUNTER — Ambulatory Visit: Payer: Medicare PPO | Admitting: Physical Therapy

## 2020-03-16 DIAGNOSIS — R29818 Other symptoms and signs involving the nervous system: Secondary | ICD-10-CM

## 2020-03-16 DIAGNOSIS — R2681 Unsteadiness on feet: Secondary | ICD-10-CM

## 2020-03-16 DIAGNOSIS — R2689 Other abnormalities of gait and mobility: Secondary | ICD-10-CM

## 2020-03-16 NOTE — Patient Instructions (Signed)

## 2020-03-17 NOTE — Therapy (Signed)
[facility] [address] [place], [place], [address] Phone: [phone]   Fax:  [phone]  Physical Therapy Treatment  Patient Details  Name: Zachary [name] MRN: [id number] Date of Birth: 1950/12/30 Referring Provider (PT): [name], Wells Guiles    Encounter Date: 03/16/2020  PT End of Session - 03/17/20 2044    Visit Number  4    Number of Visits  9    Date for PT Re-Evaluation  02/40/97   90 day cert for 8 week POC   Authorization Type  [employer] [place] form completed upon completion of eval    Progress Note Due on Visit  10    PT Start Time  1706    PT Stop Time  1745    PT Time Calculation (min)  39 min    Activity Tolerance  Patient tolerated treatment well    Behavior During Therapy  [facility] for tasks assessed/performed       Past Medical History:  Diagnosis Date  . Allergy   . Asthma   . Parkinson's disease ([place])    Congenital R fourth nerve palsy    Past Surgical History:  Procedure Laterality Date  . CATARACT EXTRACTION, BILATERAL    . COLONOSCOPY    . SHOULDER SURGERY Left    lymph nodes  . TONSILLECTOMY    . VASECTOMY      There were no vitals filed for this visit.  Subjective Assessment - 03/16/20 1709    Subjective  Have had a few days where the crashing wasn't as bad.  Had one day, with almost 24 hours of no "crashing."    Patient is accompained by:  Family member   Wife   Patient Stated Goals  Pt wants to limit the off-times each day.    Currently in Pain?  No/denies                        [facility] Adult PT Treatment/Exercise - 03/16/20 1711      Transfers   Transfers  Sit to Stand;Stand to Sit    Sit to Stand  5: Supervision;Without upper extremity assist;From chair/3-in-1    Sit to Stand Details  Verbal cues for technique    Sit to Stand Details (indicate cue type and reason)  Cues for widened BOS, cues for upright posture upon standing    Stand to Sit  5:  Supervision;Without upper extremity assist;To chair/3-in-1    Comments  Upon standing, pt noted to have bilateral knees flexed; pt able to correct to more upright standing with terminal knee extension with cues.      Ambulation/Gait   Ambulation/Gait  Yes    Ambulation/Gait Assistance  5: Supervision;4: Min guard    Ambulation/Gait Assistance Details  Pt using rollator today, as he feels this is an "off" time.  With quick transitions, pt does exhibit some freezing and festination with gait; able to improve transitional movements with cues for slowed pace, big movements     Ambulation Distance (Feet)  60 Feet   x 2, 20 x 4; 115 ft x 2   Assistive device  Rollator    Gait Pattern  Step-through pattern;Decreased arm swing - right;Decreased arm swing - left;Trunk flexed;Narrow base of support;Poor foot clearance - left;Poor foot clearance - right;Festinating    Ambulation Surface  Level;Indoor    Gait Comments  Discussed and practiced throughout session tips to reduce freezing episodes with gait, initiation of movement and with  turns.  With gait nearing his target to sit, pt noted to have increased fesintation.  Pt responds well to cues for STOP, reset, and start again with BIG steps to get to seat.      Exercises   Exercises  Knee/Hip      Knee/Hip Exercises: Stretches   Active Hamstring Stretch  Right;Left;2 reps;30 seconds      Knee/Hip Exercises: Standing   Functional Squat  1 set;10 reps    Functional Squat Limitations  Cues for terminal knee extension upon full standing       Neuro Re-education   Reviewed additions to HEP from last visit:   Stride stance weightshift, anterior/posterior, x 10 reps (therex:  Anterior/posterior weigthshfiting at counter, x 5 reps)      Balance Exercises - 03/16/20 1724      Balance Exercises: Standing   Stepping Strategy  Anterior;Posterior;Lateral;UE support;10 reps    Turning  Right;Left;3 reps   Quarter turns at counter with UE support    Marching  Solid surface;Upper extremity assist 2;Static;10 reps   2 sets, cues for widened BOS   Other Standing Exercises  Wide BOS lateral weigthshifting, then weightshift x lift, for improved SLS and dynamic balance for initiating movement.     Anterior>posterior weightshift x 10 reps, cues for increased step length in backward direction, for improved balance recovery.   PT Education - 03/17/20 2043    Education Details  Tips to reduce freezing with gait, turns, especially during off-times    Person(s) Educated  Patient    Methods  Explanation;Demonstration;Handout;Verbal cues    Comprehension  Verbalized understanding;Returned demonstration;Verbal cues required;Need further instruction       PT Short Term Goals - 02/18/20 1246      PT SHORT TERM GOAL #1   Title  Pt will perform HEP with family's supervision, to address pt's Parkinson's related deficits. [facility] for all STGs 03/18/2020 (may be delayed due to delayed scheduling)    Time  4    Period  Weeks    Status  New      PT SHORT TERM GOAL #2   Title  Pt will perform 5 times sit<>stand with no episodes of posterior lean, LOB, for improved transfer safety.    Time  4    Period  Weeks    Status  New      PT SHORT TERM GOAL #3   Title  Pt/wife will verbalize and demonstrate tips to reduce freezing episodes with gait, turns, other mobility.    Time  4    Period  Weeks    Status  New      PT SHORT TERM GOAL #4   Title  Pt will negotiate at least 4 steps, handrail, supervision, demonstrating correct technique for improved safety with stair negotiation.    Time  4    Period  Weeks    Status  New        PT Long Term Goals - 02/18/20 1248      PT LONG TERM GOAL #1   Title  Pt/wife will verbalize understanding of fall prevention in home environment.  ALL LTGS 04/15/2020 (may be delayed due to delayed initial scheduling)    Time  8    Period  Weeks    Status  New      PT LONG TERM GOAL #2   Title  Pt will perform  progression of HEP with wife's supervision for improved balance, gait, posture.    Time  8    Period  Weeks    Status  New      PT LONG TERM GOAL #3   Title  Pt will improve MiniBESTest score to at least 21/28 for decreased fall risk.    Time  8    Period  Weeks    Status  New      PT LONG TERM GOAL #4   Title  Pt/wife will demonstrate/verbalize understanding of floor to stand transfer technique, for improved fall recovery.    Time  8    Period  Weeks    Status  New            Plan - 03/17/20 2045    Clinical Impression Statement  Focused today's skilled PT session on reduction of freezing/festination and improved initiation of movement, as pt is having an "off" time during session today, where he is freezing with gait, turns, initiation of movement and changes of direction.  Utilized rollator walker throughout session, and with cues for slowed pace, BIG effort, best posture, pt is able to initiate movements with improved ease. Without cues however, he reverts to more festination and freezing.  He will continue to beneift from skilled PT to further address mobility and safety for decreased fall risk.    Personal Factors and Comorbidities  Comorbidity 2    Comorbidities  Allergies, Asthma    Examination-Activity Limitations  Locomotion Level;Transfers;Stand;Stairs    Examination-Participation Restrictions  Community Activity;Other   return to PD exercise activities   Stability/Clinical Decision Making  Stable/Uncomplicated    Rehab Potential  Good    PT Frequency  1x / week    PT Duration  8 weeks   plus eval   PT Treatment/Interventions  ADLs/Self Care Home Management;Gait training;Stair training;Functional mobility training;Therapeutic activities;Therapeutic exercise;Balance training;Neuromuscular re-education;DME Instruction;Patient/family education    PT Next Visit Plan  Check STGs (pt will need to make more appts); Review techniques/strategies to initiate movement in "off  times"; gait with start/stops, changes of direction, multi-directional stepping    Consulted and Agree with Plan of Care  Patient       Patient will benefit from skilled therapeutic intervention in order to improve the following deficits and impairments:  Abnormal gait, Difficulty walking, Decreased safety awareness, Decreased balance, Postural dysfunction, Decreased mobility  Visit Diagnosis: Other abnormalities of gait and mobility  Unsteadiness on feet  Other symptoms and signs involving the nervous system     Problem List Patient Active Problem List   Diagnosis Date Noted  . Parkinson's disease (HCC) 05/04/2013    Dionne Knoop W. 03/17/2020, 8:54 PM Lonia Blood, PT 03/17/20 8:58 PM Phone: [phone] Fax: [phone]   [facility] [address] [place], [place], [address] Phone: [phone]   Fax:  [phone]  Name: Zachary [name] MRN: [id number] Date of Birth: 21-Apr-1951

## 2020-03-23 ENCOUNTER — Other Ambulatory Visit: Payer: Self-pay

## 2020-03-23 ENCOUNTER — Ambulatory Visit: Payer: Medicare PPO | Admitting: Physical Therapy

## 2020-03-23 DIAGNOSIS — R2689 Other abnormalities of gait and mobility: Secondary | ICD-10-CM

## 2020-03-23 DIAGNOSIS — R29818 Other symptoms and signs involving the nervous system: Secondary | ICD-10-CM

## 2020-03-23 DIAGNOSIS — R2681 Unsteadiness on feet: Secondary | ICD-10-CM | POA: Diagnosis not present

## 2020-03-23 DIAGNOSIS — R293 Abnormal posture: Secondary | ICD-10-CM

## 2020-03-23 NOTE — Therapy (Signed)
[facility] [address] [place], [place], [address] Phone: [phone]   Fax:  [phone]  Physical Therapy Treatment  Patient Details  Name: Zachary [name] MRN: [id number] Date of Birth: 03/21/1951 Referring Provider (PT): [name], Wells Guiles    Encounter Date: 03/23/2020  PT End of Session - 03/23/20 1842    Visit Number  5    Number of Visits  9    Date for PT Re-Evaluation  16/55/37   90 day cert for 8 week POC   Authorization Type  [employer] [place] form completed upon completion of eval    Progress Note Due on Visit  10    PT Start Time  1617    PT Stop Time  1658    PT Time Calculation (min)  41 min    Activity Tolerance  Patient tolerated treatment well    Behavior During Therapy  [facility] for tasks assessed/performed       Past Medical History:  Diagnosis Date  . Allergy   . Asthma   . Parkinson's disease ([place])    Congenital R fourth nerve palsy    Past Surgical History:  Procedure Laterality Date  . CATARACT EXTRACTION, BILATERAL    . COLONOSCOPY    . SHOULDER SURGERY Left    lymph nodes  . TONSILLECTOMY    . VASECTOMY      There were no vitals filed for this visit.  Subjective Assessment - 03/23/20 1622    Subjective  Feel better right now than I did several hours ago, but I didn't have a full crash or rigid time.  Had one fall since last week, on golf course, getting feet tangled up.  Wants to ask for speech therapy eval to work on my voice.    Patient is accompained by:  Family member   Wife   Patient Stated Goals  Pt wants to limit the off-times each day.    Currently in Pain?  No/denies                        [facility] Adult PT Treatment/Exercise - 03/23/20 0001      Transfers   Transfers  Sit to Stand;Stand to Sit    Sit to Stand  5: Supervision;Without upper extremity assist;From chair/3-in-1;From bed    Stand to Sit  5: Supervision;Without upper extremity assist;To  chair/3-in-1;To bed      Ambulation/Gait   Ambulation/Gait  Yes    Ambulation/Gait Assistance  5: Supervision;4: Min guard    Ambulation/Gait Assistance Details  Pt uses cane to come into and out of therapy (carries cane) and does not use cane during session.      Ambulation Distance (Feet)  100 Feet   x 2; 60 ft x 2   Assistive device  Straight cane;None    Gait Pattern  Step-through pattern;Decreased arm swing - right;Decreased arm swing - left;Trunk flexed;Narrow base of support;Poor foot clearance - left;Poor foot clearance - right;Festinating    Ambulation Surface  Level;Indoor    Gait Comments  Reviewed and practiced tips to reduce freezing/festination with gait.  Pt verbalizes understanding.          Balance Exercises - 03/23/20 1648      Balance Exercises: Standing   Stepping Strategy  Anterior;Posterior;Lateral;UE support;10 reps    Other Standing Exercises  Standing on foam:  forward>back step and weightshift x 10 reps each with intermittent UE support.  Lateral step and weightshift, 2  sets x 5 reps, then step sidestep on and off mat, each direction x 5 reps.    Other Standing Exercises Comments  Reviewed HEP, including stagger stance forward/back weigthshifting; wide BOS anterior/posterior weightshifting through hips-pt requires cues for correct technique.  Also peformed standing PWR! Moves as part of HEP, x 10-15 reps each, with occasional cues for slowed pace, increased intensity.        PT Education - 03/23/20 1841    Education Details  Review of tips to reduce freezing with gait; review of HEP and importance of DAILY performance of HEP    Person(s) Educated  Patient    Methods  Explanation    Comprehension  Verbalized understanding       PT Short Term Goals - 03/23/20 1628      PT SHORT TERM GOAL #1   Title  Pt will perform HEP with family's supervision, to address pt's Parkinson's related deficits. [facility] for all STGs 03/18/2020 (may be delayed due to delayed  scheduling)    Time  4    Period  Weeks    Status  Achieved      PT SHORT TERM GOAL #2   Title  Pt will perform 5 times sit<>stand with no episodes of posterior lean, LOB, for improved transfer safety.    Time  4    Period  Weeks    Status  Achieved      PT SHORT TERM GOAL #3   Title  Pt/wife will verbalize and demonstrate tips to reduce freezing episodes with gait, turns, other mobility.    Baseline  with cues    Time  4    Period  Weeks    Status  Partially Met      PT SHORT TERM GOAL #4   Title  Pt will negotiate at least 4 steps, handrail, supervision, demonstrating correct technique for improved safety with stair negotiation.    Time  4    Period  Weeks    Status  Achieved        PT Long Term Goals - 02/18/20 1248      PT LONG TERM GOAL #1   Title  Pt/wife will verbalize understanding of fall prevention in home environment.  ALL LTGS 04/15/2020 (may be delayed due to delayed initial scheduling)    Time  8    Period  Weeks    Status  New      PT LONG TERM GOAL #2   Title  Pt will perform progression of HEP with wife's supervision for improved balance, gait, posture.    Time  8    Period  Weeks    Status  New      PT LONG TERM GOAL #3   Title  Pt will improve MiniBESTest score to at least 21/28 for decreased fall risk.    Time  8    Period  Weeks    Status  New      PT LONG TERM GOAL #4   Title  Pt/wife will demonstrate/verbalize understanding of floor to stand transfer technique, for improved fall recovery.    Time  8    Period  Weeks    Status  New            Plan - 03/23/20 1843    Clinical Impression Statement  Pt moving overall better today, carrying in his cane, not using it.  He reports not having an "crashes" today.  Assessed STGs, with pt meeting 3 of  4 STGs.  STG 3 for education for tips to reduce freezing partially met, due to pt needing reminder cues during review.  He will continue to benefit from skilled PT to further address mobility and  safety for decreased fall risk.    Personal Factors and Comorbidities  Comorbidity 2    Comorbidities  Allergies, Asthma    Examination-Activity Limitations  Locomotion Level;Transfers;Stand;Stairs    Examination-Participation Restrictions  Community Activity;Other   return to PD exercise activities   Stability/Clinical Decision Making  Stable/Uncomplicated    Rehab Potential  Good    PT Frequency  1x / week    PT Duration  8 weeks   plus eval   PT Treatment/Interventions  ADLs/Self Care Home Management;Gait training;Stair training;Functional mobility training;Therapeutic activities;Therapeutic exercise;Balance training;Neuromuscular re-education;DME Instruction;Patient/family education    PT Next Visit Plan  Review techniques/strategies to initiate movement in "off times"; gait with start/stops, changes of direction, multi-directional stepping; stepping over/around obstacles    Consulted and Agree with Plan of Care  Patient       Patient will benefit from skilled therapeutic intervention in order to improve the following deficits and impairments:  Abnormal gait, Difficulty walking, Decreased safety awareness, Decreased balance, Postural dysfunction, Decreased mobility  Visit Diagnosis: Other abnormalities of gait and mobility  Unsteadiness on feet  Other symptoms and signs involving the nervous system  Abnormal posture     Problem List Patient Active Problem List   Diagnosis Date Noted  . Parkinson's disease ([place]) 05/04/2013    Westin Knotts W. 03/23/2020, 6:46 PM  Frazier Butt., PT  [place] [address] [place], [place], [address] Phone: [phone]   Fax:  [phone]  Name: Zachary [name] MRN: [id number] Date of Birth: 1951/04/13

## 2020-03-30 ENCOUNTER — Ambulatory Visit: Payer: Medicare PPO | Attending: Neurology | Admitting: Physical Therapy

## 2020-03-30 ENCOUNTER — Other Ambulatory Visit: Payer: Self-pay

## 2020-03-30 ENCOUNTER — Encounter: Payer: Self-pay | Admitting: Physical Therapy

## 2020-03-30 DIAGNOSIS — R29818 Other symptoms and signs involving the nervous system: Secondary | ICD-10-CM | POA: Insufficient documentation

## 2020-03-30 DIAGNOSIS — R2689 Other abnormalities of gait and mobility: Secondary | ICD-10-CM

## 2020-03-30 DIAGNOSIS — G20 Parkinson's disease: Secondary | ICD-10-CM | POA: Diagnosis present

## 2020-03-30 DIAGNOSIS — R2681 Unsteadiness on feet: Secondary | ICD-10-CM | POA: Diagnosis present

## 2020-03-31 NOTE — Therapy (Signed)
[facility] [address] [place], [place], [address] Phone: [phone]   Fax:  [phone]  Physical Therapy Treatment  Patient Details  Name: Zachary [name] MRN: [id number] Date of Birth: 10/15/1951 Referring Provider (PT): [name], Rebecca    Encounter Date: 03/30/2020  PT End of Session - 03/31/20 1615    Visit Number  6    Number of Visits  9    Date for PT Re-Evaluation  05/18/20   90 day cert for 8 week POC   Authorization Type  [employer]-auth form completed upon completion of eval    Progress Note Due on Visit  10    PT Start Time  1750    PT Stop Time  1830    PT Time Calculation (min)  40 min    Activity Tolerance  Patient tolerated treatment well    Behavior During Therapy  WFL for tasks assessed/performed       Past Medical History:  Diagnosis Date  . Allergy   . Asthma   . Parkinson's disease (HCC)    Congenital R fourth nerve palsy    Past Surgical History:  Procedure Laterality Date  . CATARACT EXTRACTION, BILATERAL    . COLONOSCOPY    . SHOULDER SURGERY Left    lymph nodes  . TONSILLECTOMY    . VASECTOMY      There were no vitals filed for this visit.  Subjective Assessment - 03/30/20 1752    Subjective  Today has been an interesting day.  Went into a "crash" time right when getting to the golf course-tried to do variety of stretching and doing some of the things you've been teaching me.  Did not have a fall, took bigger steps.    Patient is accompained by:  Family member   Wife   Patient Stated Goals  Pt wants to limit the off-times each day.    Currently in Pain?  No/denies                        [facility] Adult PT Treatment/Exercise - 03/30/20 1755      Transfers   Transfers  Sit to Stand;Stand to Sit    Sit to Stand  5: Supervision;Without upper extremity assist;From chair/3-in-1    Stand to Sit  5: Supervision;Without upper extremity assist;To bed    Comments   See additional notes below, as PWR! Moves added in to 10 reps, 4 sets of sit<>stand      Ambulation/Gait   Ambulation/Gait  Yes    Ambulation/Gait Assistance  5: Supervision;4: Min guard    Ambulation/Gait Assistance Details  Pt brings rollator into session, but does not use during session    Ambulation Distance (Feet)  400 Feet   plus additional 20 ft x 8 reps   Assistive device  None    Gait Pattern  Step-through pattern;Decreased arm swing - right;Decreased arm swing - left;Trunk flexed;Narrow base of support;Poor foot clearance - left;Poor foot clearance - right;Festinating    Ambulation Surface  Level;Indoor    Pre-Gait Activities  Obstacle negotiation-forward stepping over obstacles, side stepping over obstacles (needs cues for increased step length and foot clearance), stepping around obstacles.  Four-square step activity:  clockwise/counterclockwise stepping over walking poles, cues for increased step height, step length and wider BOS to maintain balance in midline.  Multiple reps, then progressed challenge to add quick random stops and starts throughout 4-square step activity.  PT provides supervision..      Gait Comments  Gait for short distances, 20-25 ft, x 8 reps, with cues for increased step length (take fewer steps)-pt decreases from 11 steps>8 steps>7 steps.  Additional stops/starts with gait, forward/back walking, no LOB noted today.      Therapeutic Activity:   Sit<>stand followed by PWR! Up, 10 reps each Sit<>stand followed by PWR! Rock, 10 reps each Sit<>stand followed by PWR! Twist, 10 reps each Sit<>stand followed by PWR! Step, 10 reps each  Verbal and visual cues for slowed pace and technique  Floor>stand transfer with supervision, UE support at mat.  Cues provided throughout for technique, use of momentum for improved ease of transfer, widened BOS upon standing for optimal balance upon standing.        PT Short Term Goals - 03/23/20 1628      PT SHORT TERM GOAL  #1   Title  Pt will perform HEP with family's supervision, to address pt's Parkinson's related deficits. TARGET for all STGs 03/18/2020 (may be delayed due to delayed scheduling)    Time  4    Period  Weeks    Status  Achieved      PT SHORT TERM GOAL #2   Title  Pt will perform 5 times sit<>stand with no episodes of posterior lean, LOB, for improved transfer safety.    Time  4    Period  Weeks    Status  Achieved      PT SHORT TERM GOAL #3   Title  Pt/wife will verbalize and demonstrate tips to reduce freezing episodes with gait, turns, other mobility.    Baseline  with cues    Time  4    Period  Weeks    Status  Partially Met      PT SHORT TERM GOAL #4   Title  Pt will negotiate at least 4 steps, handrail, supervision, demonstrating correct technique for improved safety with stair negotiation.    Time  4    Period  Weeks    Status  Achieved        PT Long Term Goals - 02/18/20 1248      PT LONG TERM GOAL #1   Title  Pt/wife will verbalize understanding of fall prevention in home environment.  ALL LTGS 04/15/2020 (may be delayed due to delayed initial scheduling)    Time  8    Period  Weeks    Status  New      PT LONG TERM GOAL #2   Title  Pt will perform progression of HEP with wife's supervision for improved balance, gait, posture.    Time  8    Period  Weeks    Status  New      PT LONG TERM GOAL #3   Title  Pt will improve MiniBESTest score to at least 21/28 for decreased fall risk.    Time  8    Period  Weeks    Status  New      PT LONG TERM GOAL #4   Title  Pt/wife will demonstrate/verbalize understanding of floor to stand transfer technique, for improved fall recovery.    Time  8    Period  Weeks    Status  New            Plan - 03/31/20 1616    Clinical Impression Statement  Challenged patient with dynamic balance and gait activities this visit; he is able to perform activities thorughout visit without device, with PT supervision, no overt LOB.    He  has several times with obstacle negotiation, where he needs to reposition his feet for better balance.  He does report that he is using some of the strategies and tips to reduce freezing episodes.  Will continue to benefit from skilled PT to further address balance and gait due to pt's motor fluctuations with his PD.    Personal Factors and Comorbidities  Comorbidity 2    Comorbidities  Allergies, Asthma    Examination-Activity Limitations  Locomotion Level;Transfers;Stand;Stairs    Examination-Participation Restrictions  Community Activity;Other   return to PD exercise activities   Stability/Clinical Decision Making  Stable/Uncomplicated    Rehab Potential  Good    PT Frequency  1x / week    PT Duration  8 weeks   plus eval   PT Treatment/Interventions  ADLs/Self Care Home Management;Gait training;Stair training;Functional mobility training;Therapeutic activities;Therapeutic exercise;Balance training;Neuromuscular re-education;DME Instruction;Patient/family education    PT Next Visit Plan  gait with start/stops, changes of direction, multi-directional stepping; stepping over/around obstacles; work towards LTGs; compliant surfaces    Consulted and Agree with Plan of Care  Patient       Patient will benefit from skilled therapeutic intervention in order to improve the following deficits and impairments:  Abnormal gait, Difficulty walking, Decreased safety awareness, Decreased balance, Postural dysfunction, Decreased mobility  Visit Diagnosis: Other abnormalities of gait and mobility  Unsteadiness on feet  Other symptoms and signs involving the nervous system     Problem List Patient Active Problem List   Diagnosis Date Noted  . Parkinson's disease ([place]) 05/04/2013    Nycole Kawahara W. 03/31/2020, 4:21 PM Frazier Butt., PT  [facility] [address] [place], [place], [address] Phone: [phone]   Fax:   [phone]  Name: Zachary [name] MRN: [id number] Date of Birth: August 15, 1951

## 2020-04-06 ENCOUNTER — Ambulatory Visit: Payer: Medicare PPO | Admitting: Physical Therapy

## 2020-04-06 ENCOUNTER — Other Ambulatory Visit: Payer: Self-pay

## 2020-04-06 DIAGNOSIS — R29818 Other symptoms and signs involving the nervous system: Secondary | ICD-10-CM

## 2020-04-06 DIAGNOSIS — R2689 Other abnormalities of gait and mobility: Secondary | ICD-10-CM

## 2020-04-06 DIAGNOSIS — R2681 Unsteadiness on feet: Secondary | ICD-10-CM

## 2020-04-07 NOTE — Therapy (Signed)
[facility] [address] [place], [place], [address] Phone: [phone]   Fax:  [phone]  Physical Therapy Treatment  Patient Details  Name: Zachary [name] MRN: [id number] Date of Birth: 19-Sep-1951 Referring Provider (PT): [name], Wells Guiles    Encounter Date: 04/06/2020   PT End of Session - 04/07/20 0915    Visit Number 7    Number of Visits 9    Date for PT Re-Evaluation 28/78/67   90 day cert for 8 week POC   Authorization Type [employer] [place] form completed upon completion of eval    Progress Note Due on Visit 10    PT Start Time 1745    PT Stop Time 1826    PT Time Calculation (min) 41 min    Equipment Utilized During Treatment Gait belt    Activity Tolerance Patient tolerated treatment well    Behavior During Therapy [facility] for tasks assessed/performed           Past Medical History:  Diagnosis Date  . Allergy   . Asthma   . Parkinson's disease ([place])    Congenital R fourth nerve palsy    Past Surgical History:  Procedure Laterality Date  . CATARACT EXTRACTION, BILATERAL    . COLONOSCOPY    . SHOULDER SURGERY Left    lymph nodes  . TONSILLECTOMY    . VASECTOMY      There were no vitals filed for this visit.   Subjective Assessment - 04/06/20 1746    Subjective Had a fun time with the boxing group on Tuesday-talked a lot about freezing.  Tripped over my feet this afternoon, fell lightly and caught myself on my arms.    Patient is accompained by: Family member   Wife   Patient Stated Goals Pt wants to limit the off-times each day.    Currently in Pain? No/denies                             [facility] Adult PT Treatment/Exercise - 04/06/20 1750      Transfers   Transfers Sit to Stand;Stand to Sit    Sit to Stand 5: Supervision;Without upper extremity assist;From bed    Sit to Stand Details Verbal cues for technique    Stand to Sit 5: Supervision;Without upper extremity assist;To  bed    Stand to Sit Details (indicate cue type and reason) Verbal cues for technique    Number of Reps 10 reps;1 set   STanding on Airex foam               Balance Exercises - 04/07/20 0001      Balance Exercises: Standing   SLS Eyes open;Solid surface;Upper extremity support 2;Upper extremity support 1;Intermittent upper extremity support;2 reps;30 secs   Up to 30 seconds; progressively less UE support   Stepping Strategy Anterior;Posterior;Lateral;Foam/compliant surface;UE support;10 reps   Cues for slowed pace, foot placement/clearance, technique   Step Ups Forward;6 inch   with BUE lifts during step up, 2 sets x 5 reps each leg   Marching Solid surface;Dynamic;Forwards   20 ft x 6 reps with alt UE lifts, cues for slowed hold time   Other Standing Exercises Standing on foam:  forward<>back step and weightshift x 10 reps each with intermittent UE support.  Cues to slow pace and for foot clearance/placement.  Sidestep on and off mat, each direction x 10 reps.  Cues for slowed pace and for  optimal foot placement.    Other Standing Exercises Comments Standing on compliant (Airex) surface, with consecutive step-taps to 6", 12" step, x 10 reps, then alternating step taps to 12" step, using metronome at 54 bpm, to help with pacing of exercise.             Pt has questions about use of BLEs when he is using his w/c at home (during off-periods)-briefly trialed clinic w/c, office chair, and stool, all with PT close supervision, with pt taking small, hastening steps through BLEs trying to maneuver chair.  PT provides multi-modal cues for kick out, heel dig, then forward trunk lean for improved propulsion.  He is ultimately able to do this 3-5 reps, BLEs together prior to hastening movement pattern, with cues to stop and reset.    PT Short Term Goals - 03/23/20 1628      PT SHORT TERM GOAL #1   Title Pt will perform HEP with family's supervision, to address pt's Parkinson's related deficits.  TARGET for all STGs 03/18/2020 (may be delayed due to delayed scheduling)    Time 4    Period Weeks    Status Achieved      PT SHORT TERM GOAL #2   Title Pt will perform 5 times sit<>stand with no episodes of posterior lean, LOB, for improved transfer safety.    Time 4    Period Weeks    Status Achieved      PT SHORT TERM GOAL #3   Title Pt/wife will verbalize and demonstrate tips to reduce freezing episodes with gait, turns, other mobility.    Baseline with cues    Time 4    Period Weeks    Status Partially Met      PT SHORT TERM GOAL #4   Title Pt will negotiate at least 4 steps, handrail, supervision, demonstrating correct technique for improved safety with stair negotiation.    Time 4    Period Weeks    Status Achieved             PT Long Term Goals - 02/18/20 1248      PT LONG TERM GOAL #1   Title Pt/wife will verbalize understanding of fall prevention in home environment.  ALL LTGS 04/15/2020 (may be delayed due to delayed initial scheduling)    Time 8    Period Weeks    Status New      PT LONG TERM GOAL #2   Title Pt will perform progression of HEP with wife's supervision for improved balance, gait, posture.    Time 8    Period Weeks    Status New      PT LONG TERM GOAL #3   Title Pt will improve MiniBESTest score to at least 21/28 for decreased fall risk.    Time 8    Period Weeks    Status New      PT LONG TERM GOAL #4   Title Pt/wife will demonstrate/verbalize understanding of floor to stand transfer technique, for improved fall recovery.    Time 8    Period Weeks    Status New                 Plan - 04/06/20 1749    Clinical Impression Statement Even though pt comes in with no device today, he demonstrates hastening of gait and hastening of BLE motions during PT exercises and balance activities today.  Utilized multi-modal cues (verbal cues, visual cues, audio cues with metronome), but  overall difficulty slowing pace of exercises today.  Will  continue to work towards [place].    Personal Factors and Comorbidities Comorbidity 2    Comorbidities Allergies, Asthma    Examination-Activity Limitations Locomotion Level;Transfers;Stand;Stairs    Examination-Participation Restrictions Community Activity;Other   return to PD exercise activities   Stability/Clinical Decision Making Stable/Uncomplicated    Rehab Potential Good    PT Frequency 1x / week    PT Duration 8 weeks   plus eval   PT Treatment/Interventions ADLs/Self Care Home Management;Gait training;Stair training;Functional mobility training;Therapeutic activities;Therapeutic exercise;Balance training;Neuromuscular re-education;DME Instruction;Patient/family education    PT Next Visit Plan Continue gait with start/stops, changes of direction, multi-directional stepping; stepping over/around obstacles; work towards LTGs; compliant surfaces    Consulted and Agree with Plan of Care Patient           Patient will benefit from skilled therapeutic intervention in order to improve the following deficits and impairments:  Abnormal gait, Difficulty walking, Decreased safety awareness, Decreased balance, Postural dysfunction, Decreased mobility  Visit Diagnosis: Other abnormalities of gait and mobility  Unsteadiness on feet  Other symptoms and signs involving the nervous system     Problem List Patient Active Problem List   Diagnosis Date Noted  . Parkinson's disease ([place]) 05/04/2013    Kaydence Baba W. 04/07/2020, 9:17 AM  Frazier Butt., PT    [facility] [address] [place], [place], [address] Phone: [phone]   Fax:  [phone]  Name: Zachary [name] MRN: [id number] Date of Birth: Dec 22, 1950

## 2020-04-13 ENCOUNTER — Other Ambulatory Visit: Payer: Self-pay

## 2020-04-13 ENCOUNTER — Ambulatory Visit: Payer: Medicare PPO | Admitting: Physical Therapy

## 2020-04-13 DIAGNOSIS — R29818 Other symptoms and signs involving the nervous system: Secondary | ICD-10-CM

## 2020-04-13 DIAGNOSIS — R2681 Unsteadiness on feet: Secondary | ICD-10-CM

## 2020-04-13 DIAGNOSIS — R2689 Other abnormalities of gait and mobility: Secondary | ICD-10-CM | POA: Diagnosis not present

## 2020-04-13 NOTE — Therapy (Signed)
[facility] [address] [place], [place], [address] Phone: [phone]   Fax:  [phone]  Physical Therapy Treatment  Patient Details  Name: Zachary [name] MRN: [id number] Date of Birth: 1950/11/17 Referring Provider (PT): [name], Wells Guiles    Encounter Date: 04/13/2020   PT End of Session - 04/13/20 1823    Visit Number 8    Number of Visits 9    Date for PT Re-Evaluation 71/24/58   90 day cert for 8 week POC   Authorization Type [employer] [place] form completed upon completion of eval    Progress Note Due on Visit 10    PT Start Time 1532    PT Stop Time 1614    PT Time Calculation (min) 42 min    Activity Tolerance Patient tolerated treatment well    Behavior During Therapy [facility] for tasks assessed/performed   Pt fatigued during session today-reports being tired          Past Medical History:  Diagnosis Date  . Allergy   . Asthma   . Parkinson's disease ([place])    Congenital R fourth nerve palsy    Past Surgical History:  Procedure Laterality Date  . CATARACT EXTRACTION, BILATERAL    . COLONOSCOPY    . SHOULDER SURGERY Left    lymph nodes  . TONSILLECTOMY    . VASECTOMY      There were no vitals filed for this visit.   Subjective Assessment - 04/13/20 1535    Subjective Fell this morning trying to get into the car.  Was able to get up on my own.  At this point in the game (today), I'm just very tired.  Have already played 18 holes of golf.  Did have an episode of launching myself out of bed during a dream.    Patient is accompained by: Family member   Wife   Patient Stated Goals Pt wants to limit the off-times each day.    Currently in Pain? Yes    Pain Score 3     Pain Location Buttocks    Pain Orientation Right    Pain Descriptors / Indicators --   "lump"   Pain Type Acute pain    Pain Onset Today    Aggravating Factors  fall this morning    Pain Relieving Factors ice helps                           Therapeutic Activity: In addition to transfers below, pt performs at least 3 additional reps of turning to sit, then sit>stand from his locked rollator, requiring supervision and cues for safety.    [facility] Adult PT Treatment/Exercise - 04/13/20 1538      Transfers   Transfers Sit to Stand;Stand to Sit    Sit to Stand 5: Supervision;Without upper extremity assist;From bed    Sit to Stand Details Verbal cues for technique    Stand to Sit 5: Supervision;Without upper extremity assist;To bed    Stand to Sit Details (indicate cue type and reason) Verbal cues for technique    Number of Reps 10 reps;2 sets   2nd set standing on Airex   Comments TUG shuttle, from mat to chair, approx 10 ft away, to practice short distance gait and turns to sit.  Repeated 5 reps.  Pt requires cues for full turn to sit and for hand placement for stand>sit.  He also requires cues for taking larger (  less number of) steps and marching to turn fully with improved foot clearance prior to sitting.      Ambulation/Gait   Ambulation/Gait Yes    Ambulation/Gait Assistance 5: Supervision;4: Min guard    Ambulation/Gait Assistance Details Cues throughout gait for more upright posture, for improved step length and foot clearance.  Pt has several episodes of festinating; he is able to slow down and briefly stop about 50% of these episodes and reset with improved step length.  Remaining episodes, PT provides tactile and verbal cues to stop and reset for larger steps to reduce festinating with gait.    Ambulation Distance (Feet) 400 Feet   250; additional 100 ft x 2   Assistive device Rollator    Gait Pattern Step-through pattern;Decreased arm swing - right;Decreased arm swing - left;Trunk flexed;Narrow base of support;Poor foot clearance - left;Poor foot clearance - right;Festinating    Ambulation Surface Level;Indoor    Gait Comments Incorporated gait with quick stops/starts, forward/backward walking  and quick turns/change of directions with supervision.      High Level Balance   High Level Balance Activities Backward walking   Forward/back walking at counter, 4 reps, cues BIG steps   High Level Balance Comments Reviewed stagger stance weightshifting x 10 reps each foot position, then anterior/posterior weightshfiting at counter x 10 reps.  Reiterated how these movements are important in getting out of festinating episodes and how important it is to do these exercises DAILY.      Neuro Re-ed    Neuro Re-ed Details  Forward step over obstacles x 10 reps, then side step over obstacles x 10 reps (2" black obstacle), with UE support at counter                    PT Short Term Goals - 03/23/20 1628      PT SHORT TERM GOAL #1   Title Pt will perform HEP with family's supervision, to address pt's Parkinson's related deficits. TARGET for all STGs 03/18/2020 (may be delayed due to delayed scheduling)    Time 4    Period Weeks    Status Achieved      PT SHORT TERM GOAL #2   Title Pt will perform 5 times sit<>stand with no episodes of posterior lean, LOB, for improved transfer safety.    Time 4    Period Weeks    Status Achieved      PT SHORT TERM GOAL #3   Title Pt/wife will verbalize and demonstrate tips to reduce freezing episodes with gait, turns, other mobility.    Baseline with cues    Time 4    Period Weeks    Status Partially Met      PT SHORT TERM GOAL #4   Title Pt will negotiate at least 4 steps, handrail, supervision, demonstrating correct technique for improved safety with stair negotiation.    Time 4    Period Weeks    Status Achieved             PT Long Term Goals - 02/18/20 1248      PT LONG TERM GOAL #1   Title Pt/wife will verbalize understanding of fall prevention in home environment.  ALL LTGS 04/15/2020 (may be delayed due to delayed initial scheduling)    Time 8    Period Weeks    Status New      PT LONG TERM GOAL #2   Title Pt will perform  progression of HEP with wife's supervision  for improved balance, gait, posture.    Time 8    Period Weeks    Status New      PT LONG TERM GOAL #3   Title Pt will improve MiniBESTest score to at least 21/28 for decreased fall risk.    Time 8    Period Weeks    Status New      PT LONG TERM GOAL #4   Title Pt/wife will demonstrate/verbalize understanding of floor to stand transfer technique, for improved fall recovery.    Time 8    Period Weeks    Status New                 Plan - 04/13/20 1823    Clinical Impression Statement Pt with overall slowed mobility and increased festination episodes today, reports (and PT notes) being fatigued and tired today.  Utilized this time of more difficult mobility to educate patient on strategies for improved step length, improved turns, and retiterated strategies to reduce festinating episodes.  Will continue towards LTGs, with anticipated d/c next visit.    Personal Factors and Comorbidities Comorbidity 2    Comorbidities Allergies, Asthma    Examination-Activity Limitations Locomotion Level;Transfers;Stand;Stairs    Examination-Participation Restrictions Community Activity;Other   return to PD exercise activities   Stability/Clinical Decision Making Stable/Uncomplicated    Rehab Potential Good    PT Frequency 1x / week    PT Duration 8 weeks   plus eval   PT Treatment/Interventions ADLs/Self Care Home Management;Gait training;Stair training;Functional mobility training;Therapeutic activities;Therapeutic exercise;Balance training;Neuromuscular re-education;DME Instruction;Patient/family education    PT Next Visit Plan Check goals next visit and plan for d/c.    Consulted and Agree with Plan of Care Patient           Patient will benefit from skilled therapeutic intervention in order to improve the following deficits and impairments:  Abnormal gait, Difficulty walking, Decreased safety awareness, Decreased balance, Postural dysfunction,  Decreased mobility  Visit Diagnosis: Other abnormalities of gait and mobility  Unsteadiness on feet  Other symptoms and signs involving the nervous system     Problem List Patient Active Problem List   Diagnosis Date Noted  . Parkinson's disease ([place]) 05/04/2013    Wrigley Plasencia W. 04/13/2020, 6:26 PM  Frazier Butt., PT   [facility] [address] [place], [place], [address] Phone: [phone]   Fax:  [phone]  Name: HENRRY FEIL MRN: [id number] Date of Birth: [birth year]

## 2020-04-20 ENCOUNTER — Other Ambulatory Visit: Payer: Self-pay

## 2020-04-20 ENCOUNTER — Ambulatory Visit: Payer: Medicare PPO | Admitting: Physical Therapy

## 2020-04-20 DIAGNOSIS — R2681 Unsteadiness on feet: Secondary | ICD-10-CM

## 2020-04-20 DIAGNOSIS — G20 Parkinson's disease: Secondary | ICD-10-CM

## 2020-04-20 DIAGNOSIS — R2689 Other abnormalities of gait and mobility: Secondary | ICD-10-CM

## 2020-04-20 DIAGNOSIS — R29818 Other symptoms and signs involving the nervous system: Secondary | ICD-10-CM

## 2020-04-21 NOTE — Addendum Note (Signed)
Addended by: [name] S on: 04/21/2020 10:39 AM   Modules accepted: Orders

## 2020-04-21 NOTE — Therapy (Signed)
[facility] [address] [place], [place], [address] Phone: [phone]   Fax:  [phone]  Physical Therapy Treatment/Discharge Summary  Patient Details  Name: Zachary [name] MRN: [id number] Date of Birth: 02-Sep-1951 Referring Provider (PT): [name], Wells Guiles    Encounter Date: 04/20/2020   PT End of Session - 04/21/20 0802    Visit Number 9    Number of Visits 9    Date for PT Re-Evaluation 48/54/62   90 day cert for 8 week POC   Authorization Type [employer] [place] form completed upon completion of eval    Progress Note Due on Visit 10    PT Start Time 1702    PT Stop Time 1744    PT Time Calculation (min) 42 min    Activity Tolerance Patient tolerated treatment well    Behavior During Therapy [facility] for tasks assessed/performed   Pt fatigued during session today-reports being tired          Past Medical History:  Diagnosis Date  . Allergy   . Asthma   . Parkinson's disease ([place])    Congenital R fourth nerve palsy    Past Surgical History:  Procedure Laterality Date  . CATARACT EXTRACTION, BILATERAL    . COLONOSCOPY    . SHOULDER SURGERY Left    lymph nodes  . TONSILLECTOMY    . VASECTOMY      There were no vitals filed for this visit.   Subjective Assessment - 04/20/20 1701    Subjective Have had some on/off times over the past week, more "on" than off.  Have been to a few ballgames.  Haven't yet come up with a routine for all the exercises, but I try to do them sporadically.  No falls in the past week.    Patient is accompained by: Family member   Wife   Patient Stated Goals Pt wants to limit the off-times each day.    Currently in Pain? No/denies    Pain Onset --                             [facility] Adult PT Treatment/Exercise - 04/20/20 1711      Transfers   Transfers Sit to Stand;Stand to Sit    Sit to Stand 5: Supervision;Without upper extremity assist;From chair/3-in-1     Five time sit to stand comments  5.8    Stand to Sit 5: Supervision;Without upper extremity assist;To chair/3-in-1    Number of Reps 1 set;Other reps (comment)   5 reps     Ambulation/Gait   Ambulation/Gait Yes    Ambulation/Gait Assistance 5: Supervision;4: Min guard    Ambulation/Gait Assistance Details Pt feels he is moving well today.  He does take several extra steps with turns and quick stops, but does maintain balance.    Assistive device Straight cane;None    Gait Pattern Step-through pattern;Decreased arm swing - right;Decreased arm swing - left;Trunk flexed;Narrow base of support;Poor foot clearance - left;Poor foot clearance - right;Festinating    Ambulation Surface Level;Indoor    Gait velocity 4.07 ft/sec      Standardized Balance Assessment   Standardized Balance Assessment Timed Up and Go Test      Timed Up and Go Test   TUG Normal TUG;Cognitive TUG    Normal TUG (seconds) 7.65    Cognitive TUG (seconds) 7.6      Mini-BESTest   Sit To Stand Normal: Comes  to stand without use of hands and stabilizes independently.    Rise to Toes Normal: Stable for 3 s with maximum height.    Stand on one leg (left) Moderate: < 20 s   20, 2.59   Stand on one leg (right) Moderate: < 20 s   1.48, 3.44   Stand on one leg - lowest score 1    Compensatory Stepping Correction - Forward Normal: Recovers independently with a single, large step (second realignement is allowed).    Compensatory Stepping Correction - Backward Moderate: More than one step is required to recover equilibrium    Compensatory Stepping Correction - Left Lateral Normal: Recovers independently with 1 step (crossover or lateral OK)    Compensatory Stepping Correction - Right Lateral Normal: Recovers independently with 1 step (crossover or lateral OK)    Stepping Corredtion Lateral - lowest score 2    Stance - Feet together, eyes open, firm surface  Normal: 30s    Stance - Feet together, eyes closed, foam surface  Moderate:  < 30s   15.5   Incline - Eyes Closed Normal: Stands independently 30s and aligns with gravity    Change in Gait Speed Normal: Significantly changes walkling speed without imbalance    Walk with head turns - Horizontal Normal: performs head turns with no change in gait speed and good balance    Walk with pivot turns Normal: Turns with feet close FAST (< 3 steps) with good balance.    Step over obstacles Normal: Able to step over box with minimal change of gait speed and with good balance.    Timed UP & GO with Dual Task Normal: No noticeable change in sitting, standing or walking while backward counting when compared to TUG without    Mini-BEST total score 25      Self-Care   Self-Care Other Self-Care Comments    Other Self-Care Comments  Provided fall prevention education-discussed in relation to pt's motor fluctuations in regards to his Parkinson's as well as environmental factors for fall prevnetion.  Discussed progress towards goals, POC and plans for PT discharge today, with plan for PT/OT evals in approx 6-9 months.  Discussed voice volume and possibility for speech therapy-pt in agreement to request speech therapy order          Pt performs floor>stand transfer, x 3 reps, modified independently, with cues for pt to use UE support at sturdy object as needed and to come up to stand with wide BOS for improved stability upon standing.        PT Education - 04/21/20 0801    Education Details Fall prevention, POC and plans for d/c    Person(s) Educated Patient    Methods Explanation    Comprehension Verbalized understanding            PT Short Term Goals - 03/23/20 1628      PT SHORT TERM GOAL #1   Title Pt will perform HEP with family's supervision, to address pt's Parkinson's related deficits. [employer] for all STGs 03/18/2020 (may be delayed due to delayed scheduling)    Time 4    Period Weeks    Status Achieved      PT SHORT TERM GOAL #2   Title Pt will perform 5 times  sit<>stand with no episodes of posterior lean, LOB, for improved transfer safety.    Time 4    Period Weeks    Status Achieved      PT SHORT TERM GOAL #3  Title Pt/wife will verbalize and demonstrate tips to reduce freezing episodes with gait, turns, other mobility.    Baseline with cues    Time 4    Period Weeks    Status Partially Met      PT SHORT TERM GOAL #4   Title Pt will negotiate at least 4 steps, handrail, supervision, demonstrating correct technique for improved safety with stair negotiation.    Time 4    Period Weeks    Status Achieved             PT Long Term Goals - 04/20/20 1705      PT LONG TERM GOAL #1   Title Pt/wife will verbalize understanding of fall prevention in home environment.  ALL LTGS 04/15/2020 (may be delayed due to delayed initial scheduling)    Time 8    Period Weeks    Status Achieved      PT LONG TERM GOAL #2   Title Pt will perform progression of HEP with wife's supervision for improved balance, gait, posture.    Baseline Doing exercises some each day    Time 8    Period Weeks    Status Partially Met      PT LONG TERM GOAL #3   Title Pt will improve MiniBESTest score to at least 21/28 for decreased fall risk.    Baseline 25/28 04/20/2020    Time 8    Period Weeks    Status Achieved      PT LONG TERM GOAL #4   Title Pt/wife will demonstrate/verbalize understanding of floor to stand transfer technique, for improved fall recovery.    Time 8    Period Weeks    Status Achieved                 Plan - 04/21/20 7209    Clinical Impression Statement Checked LTGs this visit-pt has met 3 of 4 LTGs.  LTG 1 met for fall prevention, LTG 3 met for MiniBESTest score improved to 25/28, LTG 4 met for floor>stand transfers.  LTG 2 partially met for HEP, as pt is performing HEP but not fully or consistently, per report.  He has been educated in importance in HEP and fall prevention, continues to have motor fluctuations due to his Parkinson's  disease.  He is appropriate for d/c from PT this visit.    Personal Factors and Comorbidities Comorbidity 2    Comorbidities Allergies, Asthma    Examination-Activity Limitations Locomotion Level;Transfers;Stand;Stairs    Examination-Participation Restrictions Community Activity;Other   return to PD exercise activities   Stability/Clinical Decision Making Stable/Uncomplicated    Rehab Potential Good    PT Frequency 1x / week    PT Duration 8 weeks   plus eval   PT Treatment/Interventions ADLs/Self Care Home Management;Gait training;Stair training;Functional mobility training;Therapeutic activities;Therapeutic exercise;Balance training;Neuromuscular re-education;DME Instruction;Patient/family education    PT Next Visit Plan Discharge this visit; plan for return PT eval in 6-9 months (also likely OT eval at that time).  Pt is agreeable to requesting speech therapy eval due to lower voice volume.    Consulted and Agree with Plan of Care Patient           Patient will benefit from skilled therapeutic intervention in order to improve the following deficits and impairments:  Abnormal gait, Difficulty walking, Decreased safety awareness, Decreased balance, Postural dysfunction, Decreased mobility  Visit Diagnosis: Other abnormalities of gait and mobility  Unsteadiness on feet  Other symptoms and signs involving the nervous  system     Problem List Patient Active Problem List   Diagnosis Date Noted  . Parkinson's disease ([place]) 05/04/2013    Lavoy Bernards W. 04/21/2020, 8:16 AM  Frazier Butt., PT   [facility] [address] [place], [place], [address] Phone: [phone]   Fax:  [phone]  Name: DAITON COWLES MRN: [id number] Date of Birth: 10/27/1951   PHYSICAL THERAPY DISCHARGE SUMMARY  Visits from Start of Care: 9  Current functional level related to goals / functional outcomes:  PT Long Term Goals - 04/20/20  1705      PT LONG TERM GOAL #1   Title Pt/wife will verbalize understanding of fall prevention in home environment.  ALL LTGS 04/15/2020 (may be delayed due to delayed initial scheduling)    Time 8    Period Weeks    Status Achieved      PT LONG TERM GOAL #2   Title Pt will perform progression of HEP with wife's supervision for improved balance, gait, posture.    Baseline Doing exercises some each day    Time 8    Period Weeks    Status Partially Met      PT LONG TERM GOAL #3   Title Pt will improve MiniBESTest score to at least 21/28 for decreased fall risk.    Baseline 25/28 04/20/2020    Time 8    Period Weeks    Status Achieved      PT LONG TERM GOAL #4   Title Pt/wife will demonstrate/verbalize understanding of floor to stand transfer technique, for improved fall recovery.    Time 8    Period Weeks    Status Achieved          Pt has met 3 of 4 LTGs.   Remaining deficits: Motor fluctuations, which produce increased freezing episodes, hastening of gait-improved with cues for slowed pacing and strategies to reduce freezing episodes   Education / Equipment: Educated in [employer], fall prevention  Plan: Patient agrees to discharge.  Patient goals were partially met. Patient is being discharged due to being pleased with the current functional level.  ?????Recommend PT eval return in 6-9 months as well as likely OT eval.     Mady Haagensen, PT 04/21/20 8:19 AM Phone: [phone] Fax: [phone]

## 2020-05-10 ENCOUNTER — Telehealth: Payer: Self-pay

## 2020-05-10 NOTE — Telephone Encounter (Signed)
Received call from patient stating he is calling to inform Dr [name] that he is having freezing spells. He states these periods happen for about a hour and he is trying to understanding what is going on with him. He states during his freezing period he has non movements and feels rigidly. He states it can take him 5 minutes to go. He states he has two freezing periods one in the morning and one in the evening.

## 2020-05-10 NOTE — Telephone Encounter (Signed)
Spoke with and gave him Dr Don Perking recommendations. Patient voiced understanding.   Patient states he ws not sure if caffeine was slowing him down or if it could help speed things up.   He states he would like any advice you could offer.   Advised patient that Dr tat was in clinic and I would give him a call back later today. Patient voiced understanding.

## 2020-05-10 NOTE — Telephone Encounter (Signed)
Caffeine has no effect on freezing

## 2020-05-10 NOTE — Telephone Encounter (Signed)
Left message with Dr Don Perking recommendations on patients voicemail. Advised patient to contact office with any questions or concerns.

## 2020-05-10 NOTE — Telephone Encounter (Signed)
Unfortunately, there is probably not a ton we can do about that (especially since he had trouble using that inbrija).  Freezing is very very frustrating but often not med responsive and even if we could get med in him, it would take more than 5 min to kick in.

## 2020-07-01 NOTE — Progress Notes (Signed)
Assessment/Plan:   1.  Parkinsons Disease   -take Stalevo 150, 2 tablets at 6:30 AM/2 tablets at 8:45 (from 9am) AM/increase to 2 tablet at 11:30 AM/2 tablets at 2 PM/1 tablet at 4:30 PM/1 tablet at 7 PM.    -may change from stalevo in the future to carbidopa/levodopa 25/100 and entacapone alone  -Continue carbidopa/levodopa 50/200 at bedtime  -discussed changing from cane to walker  2.  RBD  -Safety discussed  -Was on 0.25 mg of clonazepam in the past.  They discontinued as pharmacist told them it was addictive.  May need to address in the future.  3.  MCI  -Has not had neurocognitive testing in over 2 years, but last was done in January, 2019 at which point he had only MCI.  4.  Low BP  -asymptommatic.  Drinking plenty of water per pt - told him to drink 60 oz.  -may need to consider abdominal compression binder but currently asymptommatic  Subjective:   Zachary [name] was seen today in follow up for Parkinsons disease.  My previous records were reviewed prior to todays visit as well as outside records available to me.  Pt with wife who supplements hx.  Few falls, some related to freezing or getting stuck in a turn. Didn't get hurt.   He is noting freezing and more "off" episodes.    Pt denies lightheadedness, near syncope.  No hallucinations.  Mood has been good.  States that he has not been exercising as much - still doing ACT 2 days per week and boxing 2 days per week.    Current prescribed movement disorder medications: Stalevo 150, 2 tablets at 6:30 AM/2 tablets at 9 AM/1 tablet at 11:30 AM/2 tablets at 2 PM/1 tablet at 4:30 PM/1 tablet at 7 PM Carbidopa/levodopa 50/200 at bedtime Inbrija (has trouble using it)    PREVIOUS MEDICATIONS: amantadine; Azilect; Inbrija; carbidopa/levodopa 25/100; carbidopa/levodopa 50/200; Stalevo; clonazepam (was only on 0.25 mg at bedtime and they discontinued as pharmacist told them it was addictive)  ALLERGIES:   Allergies  Allergen  Reactions  . Tree Extract     CURRENT MEDICATIONS:  Outpatient Encounter Medications as of 07/05/2020  Medication Sig  . AMBULATORY NON FORMULARY MEDICATION Transport chair Dx: Parkinsons Disease G20  . carbidopa-levodopa (SINEMET CR) 50-200 MG tablet TAKE ONE TABLET AT BEDTIME.  . carbidopa-levodopa-entacapone (STALEVO) 37.5-150-200 MG tablet TAKE 2 TABS AT 6:30AM, 2 TAB 9AM, 1 TAB 11:30AM, 2 AT 2PM, 1 TAB AT 4:30PM, AND 7PM.  . Cholecalciferol (DIALYVITE VITAMIN D 5000 PO) Take 1 tablet by mouth daily.  . Lactobacillus (PROBIOTIC ACIDOPHILUS PO) Take 1 tablet by mouth daily.  Marland Kitchen PROAIR HFA 108 (90 Base) MCG/ACT inhaler Inhale 2 puffs into the lungs every 4 (four) hours as needed.   Marland Kitchen UNABLE TO FIND Hemp drops PRN during the day   No facility-administered encounter medications on file as of 07/05/2020.    Objective:   PHYSICAL EXAMINATION:    VITALS:   Vitals:   07/05/20 1120 07/05/20 1121  BP: (!) 93/56 (!) 89/49  Pulse: 61 60  SpO2: 97%   Weight: 173 lb (78.5 kg)   Height: 5' 9.5" (1.765 m)     GEN:  The patient appears stated age and is in NAD. HEENT:  Normocephalic, atraumatic.  The mucous membranes are moist. The superficial temporal arteries are without ropiness or tenderness. CV:  RRR Lungs:  CTAB Neck/HEME:  There are no carotid bruits bilaterally.  Neurological examination:  Orientation: The patient is alert and oriented x3. Cranial nerves: There is good facial symmetry with facial hypomimia. The speech is fluent and clear. Soft palate rises symmetrically and there is no tongue deviation. Hearing is intact to conversational tone. Sensation: Sensation is intact to light touch throughout Motor: Strength is at least antigravity x4.  Movement examination: Tone: There is mod increased tone in the bilateral UE Abnormal movements: there is R leg dyskinesia Coordination:  There is  decremation with RAM's, with hand opening and closing and toe taps. Gait and Station: The  patient has no difficulty arising out of a deep-seated chair without the use of the hands. The patient's stride length is very good (showing off) with very good arm swing.     I have reviewed and interpreted the following labs independently    Chemistry      Component Value Date/Time   NA 137 09/15/2012 0735   K 4.0 09/15/2012 0735   CL 102 09/15/2012 0735   CO2 26 09/15/2012 0735   BUN 15 09/15/2012 0735   CREATININE 0.71 09/15/2012 0735      Component Value Date/Time   CALCIUM 8.7 09/15/2012 0735       Lab Results  Component Value Date   WBC 5.0 09/15/2012   HGB 14.6 09/15/2012   HCT 42.1 09/15/2012   MCV 94.6 09/15/2012   PLT 217 09/15/2012    No results found for: TSH   Total time spent on today's visit was 40 minutes, including both face-to-face time and nonface-to-face time.  Time included that spent on review of records (prior notes available to me/labs/imaging if pertinent), discussing treatment and goals, answering patient's questions and coordinating care.  Cc:  Martha Clan, MD

## 2020-07-05 ENCOUNTER — Other Ambulatory Visit: Payer: Self-pay

## 2020-07-05 ENCOUNTER — Ambulatory Visit: Payer: Medicare PPO | Admitting: Neurology

## 2020-07-05 ENCOUNTER — Encounter: Payer: Self-pay | Admitting: Neurology

## 2020-07-05 VITALS — BP 89/49 | HR 60 | Ht 69.5 in | Wt 173.0 lb

## 2020-07-05 DIAGNOSIS — G20 Parkinson's disease: Secondary | ICD-10-CM

## 2020-07-05 DIAGNOSIS — G249 Dystonia, unspecified: Secondary | ICD-10-CM

## 2020-07-05 NOTE — Patient Instructions (Addendum)
1.  Stalevo 150, 2 tablets at 6:30 AM/2 tablets at 8:45 (from 9am) AM/increase to 2 tablet at 11:30 AM/2 tablets at 2 PM/1 tablet at 4:30 PM/1 tablet at 7 PM.    2.  Get a hydroflask - 30oz - drink 2 per day.  You can get these off of amaz[employer] or [employer] or I believe even [employer]  3.  Use walker!  The physicians and staff at [facility] are committed to providing excellent care. You may receive a survey requesting feedback about your experience at our office. We strive to receive "very good" responses to the survey questions. If you feel that your experience would prevent you from giving the office a "very good " response, please contact our office to try to remedy the situation. We may be reached at [phone]. Thank you for taking the time out of your busy day to complete the survey.

## 2020-07-19 ENCOUNTER — Telehealth: Payer: Self-pay | Admitting: Neurology

## 2020-07-19 NOTE — Telephone Encounter (Signed)
He need to speak to someone about getting his Handicap sticker renewed please call

## 2020-07-19 NOTE — Telephone Encounter (Signed)
Left message, if Dr.[name] has done this before, I am sure it will be fine, to get from [facility]. If its a new sticker, that she has never authorized, I suggested he contact office for further instructions.

## 2020-07-21 ENCOUNTER — Other Ambulatory Visit: Payer: Self-pay | Admitting: Neurology

## 2020-07-21 MED ORDER — CARBIDOPA-LEVODOPA-ENTACAPONE 37.5-150-200 MG PO TABS: ORAL_TABLET | ORAL | 3 refills | Status: DC

## 2020-07-21 NOTE — Telephone Encounter (Signed)
Patient wife states that we increased his Carbidopa levodopa. We need to send in the updated RX to the [facility]

## 2020-07-21 NOTE — Telephone Encounter (Signed)
Rx(s) sent to pharmacy electronically.  

## 2020-08-19 DIAGNOSIS — W19XXXA Unspecified fall, initial encounter: Secondary | ICD-10-CM | POA: Diagnosis not present

## 2020-08-19 DIAGNOSIS — G20 Parkinson's disease: Secondary | ICD-10-CM | POA: Diagnosis not present

## 2020-08-19 DIAGNOSIS — M25522 Pain in left elbow: Secondary | ICD-10-CM | POA: Diagnosis not present

## 2020-09-09 ENCOUNTER — Other Ambulatory Visit: Payer: Self-pay | Admitting: Neurology

## 2020-09-09 DIAGNOSIS — G20 Parkinson's disease: Secondary | ICD-10-CM

## 2020-09-09 NOTE — Telephone Encounter (Signed)
Rx(s) sent to pharmacy electronically.  

## 2020-10-05 ENCOUNTER — Other Ambulatory Visit: Payer: Self-pay | Admitting: Neurology

## 2020-10-05 DIAGNOSIS — G20 Parkinson's disease: Secondary | ICD-10-CM

## 2020-10-05 NOTE — Telephone Encounter (Signed)
Rx(s) sent to pharmacy electronically.  

## 2020-11-07 ENCOUNTER — Other Ambulatory Visit: Payer: Self-pay | Admitting: Neurology

## 2020-11-07 NOTE — Telephone Encounter (Signed)
Rx(s) sent to pharmacy electronically.  

## 2020-11-28 ENCOUNTER — Encounter: Payer: Self-pay | Admitting: Neurology

## 2020-11-28 NOTE — Progress Notes (Signed)
Efraim Kaufmann Key: [id number] - PA Case ID: [id number] Need help? Call us at [phone] Outcome Approvedtoday PA Case: [id number], Status: Approved, Coverage Starts on: 10/29/2020 12:00:00 AM, Coverage Ends on: 10/28/2021 12:00:00 AM. Questions? Contact [phone]. Drug Carbidopa-Levodopa-Entacapone 37.5-150-200MG  tablets Form [employer] Electronic PA Form

## 2020-12-09 ENCOUNTER — Telehealth: Payer: Self-pay | Admitting: Neurology

## 2020-12-09 NOTE — Telephone Encounter (Signed)
Spoke with wife who states she is not show what to do because the patient is a become handful and she wants to get power of attorney.   Advised her to start with her PCP. She wanted to know if Dr [name] could write a letter. I advised her that I do not think it works like that and advised her to contact her pcp. She voiced understanding.

## 2020-12-09 NOTE — Telephone Encounter (Signed)
If she is asking about a competancy/capacity examination, we can give them information to get that done (heather has that information as we don't do them here).  We only do neurocognitive assessments here.

## 2020-12-09 NOTE — Telephone Encounter (Signed)
Patient's wife called to ask for some advice. She states that her husband is really not doing well and there is a lot of concern for him between his caregivers and family/friends that know him well. She is going to need to get a power of attorney and is seeking some guidance on where to start and what to do. Please call.

## 2020-12-20 DIAGNOSIS — R42 Dizziness and giddiness: Secondary | ICD-10-CM | POA: Diagnosis not present

## 2020-12-20 DIAGNOSIS — I951 Orthostatic hypotension: Secondary | ICD-10-CM | POA: Diagnosis not present

## 2020-12-20 DIAGNOSIS — W19XXXA Unspecified fall, initial encounter: Secondary | ICD-10-CM | POA: Diagnosis not present

## 2020-12-20 DIAGNOSIS — G20 Parkinson's disease: Secondary | ICD-10-CM | POA: Diagnosis not present

## 2021-01-02 ENCOUNTER — Other Ambulatory Visit: Payer: Self-pay | Admitting: Neurology

## 2021-01-03 NOTE — Telephone Encounter (Signed)
Rx(s) sent to pharmacy electronically.  

## 2021-01-03 NOTE — Progress Notes (Signed)
Assessment/Plan:   1.  Parkinsons Disease  -STOP Stalevo 150 mg due to some confusion  -Start carbidopa/levodopa 25/100, 2 at 6:30 am, 1 tablets at 8:30am, 2 at 10:30 am, 1 at 12:30pm, 2 at 2:30 pm, 1 at 4:30 pm, 2 at 6:30-7pm.  You can take an extra 1/2 tablet and put in ginger ale if needed in the day for an off day.  This is an increase in dosage of 200 mg of levodopa overall  -Continue carbidopa/levodopa 50/200 at bed.  -recommend walker at all times.  Long discussion about this.  2.  RBD             -Safety discussed             -Was on 0.25 mg of clonazepam in the past.  They discontinued as pharmacist told them it was addictive.  May need to address in the future.  3.  MCI             -schedule repeat neurocognitive testing.  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  Low BP             -suspect he is developing Neurogenic Orthostatic Hypotension.  Told him last visit to increase water intake.  May have had syncopal episode since then.  Just followed up with PCP and told to increase water and states that he is now.   Subjective:   Zachary [name] was seen today in follow up for Parkinsons disease.  My previous records were reviewed prior to todays visit as well as outside records available to me. Pt c/o falls.  States that falls are "sporadic."  [place]at 4 times [place] - was festinating and going downhill.  Fell in parking lot at [employer].  Has fallen in shower as well.   Was not using ambulatory assistive device with any of the falls.  Pt denies lightheadedness, but may have had a near syncopal episode at church vs syncope.  Wife thinks it was fall but pt thinks it was syncopal.  No hallucinations in day.  Wife states that he has started going to RR in middle of the night on the floor because he thought that there were bodies on the floor.  Had labs few days ago with Dr. Clelia Croft.    Current prescribed movement disorder medications: Stalevo 150, 2 tablets at 6:30 AM/2 tablets at  8:45 AM/2 tablets at 11:30 AM/2 tablets at 2 PM/1 tablet at 4:30 PM/1 tablet at 7 PM (an increase)   PREVIOUS MEDICATIONS: amantadine;Azilect;Inbrija;carbidopa/levodopa 25/100; carbidopa/levodopa 50/200;Stalevo; clonazepam (was only on 0.25 mg at bedtime and they discontinued as pharmacist told them it was addictive)  ALLERGIES:   Allergies  Allergen Reactions  . Tree Extract     CURRENT MEDICATIONS:  Outpatient Encounter Medications as of 01/04/2021  Medication Sig  . AMBULATORY NON FORMULARY MEDICATION Transport chair Dx: Parkinsons Disease G20  . carbidopa-levodopa (SINEMET CR) 50-200 MG tablet TAKE ONE TABLET AT BEDTIME.  . carbidopa-levodopa-entacapone (STALEVO) 37.5-150-200 MG tablet TAKE 2 TABS AT 6:30AM, 2 TAB 8:45AM, 2 TABS 11:30AM, 2 AT 2PM, 1 TAB AT 4:30PM, AND 7PM. (Patient taking differently: TAKE 2 TABS AT 6:30AM, 2 TAB 8:30AM, 2 TABS 11:30AM, 2 AT 2PM, 1 TAB AT 4:30PM, AND 7PM.)  . Cholecalciferol (DIALYVITE VITAMIN D 5000 PO) Take 1 tablet by mouth daily.  . Lactobacillus (PROBIOTIC ACIDOPHILUS PO) Take 1 tablet by mouth daily.  . Multiple Vitamins-Minerals (PRESERVISION AREDS 2 PO) Take  1 tablet by mouth daily.  Marland Kitchen UNABLE TO FIND Hemp drops PRN during the day  . [DISCONTINUED] carbidopa-levodopa-entacapone (STALEVO) 37.5-150-200 MG tablet TAKE 2 TABS AT 6:30AM, 2 TAB 8:45AM, 2 TABS 11:30AM, 2 AT 2PM, 1 TAB AT 4:30PM, AND 7PM.  . [DISCONTINUED] PROAIR HFA 108 (90 Base) MCG/ACT inhaler Inhale 2 puffs into the lungs every 4 (four) hours as needed.  (Patient not taking: Reported on 01/04/2021)   No facility-administered encounter medications on file as of 01/04/2021.    Objective:   PHYSICAL EXAMINATION:    VITALS:   Vitals:   01/04/21 1112  BP: 114/60  Pulse: 62  SpO2: 98%  Weight: 172 lb (78 kg)  Height: 5' 9.5" (1.765 m)    GEN:  The patient appears stated age and is in NAD. HEENT:  Normocephalic, atraumatic.  The mucous membranes are moist. The superficial  temporal arteries are without ropiness or tenderness. CV:  RRR Lungs:  CTAB Neck/HEME:  There are no carotid bruits bilaterally.  Neurological examination:  Orientation: The patient is alert and oriented x3. Cranial nerves: There is good facial symmety with facial hypomimia. The speech is fluent and clear. Soft palate rises symmetrically and there is no tongue deviation. Hearing is intact to conversational tone. Sensation: Sensation is intact to light touch throughout Motor: Strength is at least antigravity x4.  Movement examination: Tone: There ismod increased tone in the RUE Abnormal movements:there is mild R leg dyskinesia Coordination: There is decremation with RAM's, with hand opening and closing and toe taps. Gait and Station: The patient hasnodifficulty arising out of a deep-seated chair without the use of the hands. The patient's stride length is very good (showing off) with very good arm swing (same as previous)  I have reviewed and interpreted the following labs independently    Chemistry      Component Value Date/Time   NA 137 09/15/2012 0735   K 4.0 09/15/2012 0735   CL 102 09/15/2012 0735   CO2 26 09/15/2012 0735   BUN 15 09/15/2012 0735   CREATININE 0.71 09/15/2012 0735      Component Value Date/Time   CALCIUM 8.7 09/15/2012 0735       Lab Results  Component Value Date   WBC 5.0 09/15/2012   HGB 14.6 09/15/2012   HCT 42.1 09/15/2012   MCV 94.6 09/15/2012   PLT 217 09/15/2012    No results found for: TSH   Total time spent on today's visit was 40 minutes, including both face-to-face time and nonface-to-face time.  Time included that spent on review of records (prior notes available to me/labs/imaging if pertinent), discussing treatment and goals, answering patient's questions and coordinating care.  Cc:  [name], MD

## 2021-01-04 ENCOUNTER — Ambulatory Visit: Payer: Medicare PPO | Admitting: Neurology

## 2021-01-04 ENCOUNTER — Other Ambulatory Visit: Payer: Self-pay

## 2021-01-04 ENCOUNTER — Encounter: Payer: Self-pay | Admitting: Neurology

## 2021-01-04 VITALS — BP 114/60 | HR 62 | Ht 69.5 in | Wt 172.0 lb

## 2021-01-04 DIAGNOSIS — R413 Other amnesia: Secondary | ICD-10-CM

## 2021-01-04 DIAGNOSIS — G20 Parkinson's disease: Secondary | ICD-10-CM | POA: Diagnosis not present

## 2021-01-04 DIAGNOSIS — G249 Dystonia, unspecified: Secondary | ICD-10-CM | POA: Diagnosis not present

## 2021-01-04 MED ORDER — CARBIDOPA-LEVODOPA 25-100 MG PO TABS: ORAL_TABLET | ORAL | 1 refills | Status: DC

## 2021-01-04 NOTE — Patient Instructions (Addendum)
1.  STOP Stalevo 150 mg 2.  Start carbidopa/levodopa 25/100, 2 at 6:30 am, 1 tablets at 8:30am, 2 at 10:30 am, 1 at 12:30pm, 2 at 2:30 pm, 1 at 4:30 pm, 2 at 6:30-7pm.  You can take an extra 1/2 tablet and put in ginger ale if needed in the day for an off day. 3.  Continue carbidopa/levodopa 50/200 at bed.  You have been referred for a neurocognitive evaluation (i.e., evaluation of memory and thinking abilities). Please bring someone with you to this appointment if possible, as it is helpful for the neuropsychologist to hear from both you and another adult who knows you well. Please bring eyeglasses and hearing aids if you wear them.    The evaluation will take approximately 3 hours and has two parts:   . The first part is a clinical interview with the neuropsychologist, Dr. Milbert Coulter or Dr. Roseanne Reno. During the interview, the neuropsychologist will speak with you and the individual you brought to the appointment.    . The second part of the evaluation is testing with the doctor's technician, aka psychometrician, Annabelle Harman or [employer]. During the testing, the technician will ask you to remember different types of material, solve problems, and answer some questionnaires. Your family member will not be present for this portion of the evaluation.   Please note: We have to reserve several hours of the neuropsychologist's time and the psychometrician's time for your evaluation appointment. As such, there is a No-Show fee of $100. If you are unable to attend any of your appointments, please contact our office as soon as possible to reschedule.

## 2021-01-09 ENCOUNTER — Other Ambulatory Visit: Payer: Self-pay | Admitting: Neurology

## 2021-01-09 DIAGNOSIS — G20 Parkinson's disease: Secondary | ICD-10-CM

## 2021-01-13 ENCOUNTER — Other Ambulatory Visit: Payer: Self-pay | Admitting: Neurology

## 2021-01-20 ENCOUNTER — Telehealth: Payer: Self-pay | Admitting: Neurology

## 2021-01-20 MED ORDER — CARBIDOPA-LEVODOPA 25-100 MG PO TABS
ORAL_TABLET | ORAL | 3 refills | Status: DC
Start: 2021-01-20 — End: 2021-02-07

## 2021-01-20 NOTE — Telephone Encounter (Signed)
Patients wife notified and voiced understanding.   Rx(s) sent to pharmacy electronically.

## 2021-01-20 NOTE — Telephone Encounter (Signed)
Spoke with wife and gave her Dr Don Perking recommendations. She states they are having company this weekend and the patient needs to be able to walk. She states she will start this dose on Sunday. She states she doesn't think Dr [name] understands that the dose the patient was on is less than what he is now taking. I advised wife that Dr [name] has increased the amount of tabs he will take so she should try that and if that does not work for her to contact the office.

## 2021-01-20 NOTE — Telephone Encounter (Signed)
I think that she is right that he is not getting enough medication but going back to stalevo/entacapone is NOT the answer because it causes confusion and that will only get worse with age.  I think that my math was off a bit when I transitioned him and I undershot the dose.  Have him take:   carbidopa/levodopa 25/100 , 2 at 6:30 am, 2 at 8:30am, 2 at 10:30 am, 2 at 12:30pm, 2 at 2:30 pm, 2 at 4:30 pm, 2 at 6:30-7pm in addition to the carbidopa/levodopa 50/200 CR at bedtime.  Please correct dosages in chart for accuracy

## 2021-01-20 NOTE — Telephone Encounter (Signed)
He wasn't on less before.  Stalevo was a higher dosage per pill

## 2021-01-20 NOTE — Telephone Encounter (Addendum)
Spoke with patients wife who states Dr [name] recently discontinued Entacapone for the patient. She states the patient crashed all day yesterday with the new change. She feels the patient is not getting enough medication. She states she rather the patient be manic instead of crashing. She states she decided to give the patient the old entacapone rx today because she still has some left. She states the patient is doing well. She states when Dr [name] made the change that the patient does not get enough carbidopa levodopa.   I advised her that I would send her concerns to Dr [name] and get back to her. She voiced understanding.

## 2021-01-20 NOTE — Telephone Encounter (Signed)
Patient's wife, [name], called after hours to report a medication change (no name given) did not go well yesterday. She said, "He was crashing all day long. The medication change didn't work. What do we do?"

## 2021-01-23 DIAGNOSIS — H04123 Dry eye syndrome of bilateral lacrimal glands: Secondary | ICD-10-CM | POA: Diagnosis not present

## 2021-01-23 DIAGNOSIS — H52203 Unspecified astigmatism, bilateral: Secondary | ICD-10-CM | POA: Diagnosis not present

## 2021-01-23 DIAGNOSIS — Z961 Presence of intraocular lens: Secondary | ICD-10-CM | POA: Diagnosis not present

## 2021-01-25 ENCOUNTER — Telehealth: Payer: Self-pay | Admitting: Neurology

## 2021-01-25 NOTE — Telephone Encounter (Signed)
Patient wants to speak to someone about his parkinson medication please  Call

## 2021-01-26 NOTE — Telephone Encounter (Signed)
Left message for patient to contact office.

## 2021-01-30 NOTE — Telephone Encounter (Signed)
Received call from patients wife who states the patient is still freezing and lerthargic. She states even though the extra three tabs were added the patient is not moving like he was with the entacapone. She wants to know if a small dose of entacapone can be added back to him medication or if Dr [name] has any other medication suggestions for helping the patient with the medication. Wife is concerned that nothing has been given in place of the entacapone.  Advised wife that I would speak with Dr [name] and get back to her.

## 2021-01-30 NOTE — Telephone Encounter (Signed)
Please put pt on cx list (I think you can bring in next Thursday)

## 2021-01-31 NOTE — Telephone Encounter (Signed)
Patient is on the wait list and I was able to get the patient in on 02-07-21 at 11:15. I kept his June appt just in case if we need to see him back around that time. We will cancel it if we need to when he gets here after seeing [name]

## 2021-02-06 NOTE — Progress Notes (Signed)
Assessment/Plan:   1.  Parkinsons Disease  -slightly increase Carbidopa/levodopa 25/100, 3 tablets at 6:30 AM/2 at 8:30 AM/2 at 10:30 AM/2 at 12:30 PM/2 at 2:30 PM/2 at 4:30pm/2 at 6:30pm.  Can take up to extra tablet (in 1/2 tabs) per day prn  -discussed dose failure and can re-dose prn  -Continue carbidopa/levodopa 50/200 at bed.  -recommend walker at all times.  Long discussion about this.  -refer for ST  2.  RBD             -Safety discussed             -Was on 0.25 mg of clonazepam in the past.  They discontinued as pharmacist told them it was addictive.  May need to address in the future.  3.  MCI             -patient has repeat neurocognitive testing scheduled for May 24.  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  Low BP             -suspect he is developing Neurogenic Orthostatic Hypotension.  Told him last visit to increase water intake.  May have had syncopal episode since then.  He has an appt with Dr. Ladona Ridgel made by pcp.   Subjective:   Zachary [name] was seen today in follow up for Parkinsons disease.  My previous records were reviewed prior to todays visit as well as outside records available to me. Pt last seen about a month ago.  At that point in time, I took him off of Stalevo and changed him solely to levodopa.  Unfortunately, I initially under shot his dosage and his wife called and stated that patient was not doing well.  Patients levodopa was increased over the phone.  He continues to be somewhat more stiff and slow.  However, they state that they started the 3 in the AM instead of 2, and he is doing better in the AM and is slow at night because he reduced dose in evening to accomodate.  Only one fall since last visit and that was right after last visit.  Wife does state that thinking has been a bit clearer since d/c the entacapone.     Current prescribed movement disorder medications: Carbidopa/levodopa 25/100, 2  tablets at 6:30 AM (pt states that he is doing 3 at this hour)/2 at 8:30 AM/2 at 10:30 AM/2 at 12:30 PM/2 at 2:30 PM/2 at 4:30pm/2 at 6:30pm (only doing 1 at this time since he started doing 3 in the AM) Carbidopa/levodopa 50/200 at bedtime   PREVIOUS MEDICATIONS: amantadine;Azilect;Inbrija;carbidopa/levodopa 25/100; carbidopa/levodopa 50/200;Stalevo; clonazepam (was only on 0.25 mg at bedtime and they discontinued as pharmacist told them it was addictive)  ALLERGIES:   Allergies  Allergen Reactions  . Tree Extract     CURRENT MEDICATIONS:  Outpatient Encounter Medications as of 02/07/2021  Medication Sig  . AMBULATORY NON FORMULARY MEDICATION Transport chair Dx: Parkinsons Disease G20  . carbidopa-levodopa (SINEMET CR) 50-200 MG tablet TAKE ONE TABLET AT BEDTIME.  Marland Kitchen Cholecalciferol (DIALYVITE VITAMIN D 5000 PO) Take 1 tablet by mouth daily.  . Lactobacillus (PROBIOTIC ACIDOPHILUS PO) Take 1 tablet by mouth daily.  . Multiple Vitamins-Minerals (PRESERVISION AREDS 2 PO) Take 1 tablet by mouth daily.  . [DISCONTINUED] carbidopa-levodopa (SINEMET) 25-100 MG tablet 2 at 6:30 am, 2 at 8:30am, 2 at 10:30 am, 2 at 12:30pm, 2 at 2:30 pm, 2 at 4:30 pm, 2  at 6:30-7pm.  . carbidopa-levodopa (SINEMET) 25-100 MG tablet 3 tablets at 6:30 AM/2 at 8:30 AM/2 at 10:30 AM/2 at 12:30 PM/2 at 2:30 PM/2 at 4:30pm/2 at 6:30pm.  Can take up to extra tablet (in 1/2 tabs) per day prn  . [DISCONTINUED] UNABLE TO FIND Hemp drops PRN during the day (Patient not taking: Reported on 02/07/2021)   No facility-administered encounter medications on file as of 02/07/2021.    Objective:   PHYSICAL EXAMINATION:    VITALS:   Vitals:   02/07/21 1105  BP: (!) 92/50  Pulse: 65  SpO2: 97%  Weight: 170 lb (77.1 kg)  Height: 5' 9.5" (1.765 m)    GEN:  The patient appears stated age and is in NAD. HEENT:  Normocephalic, atraumatic.  The mucous membranes are moist. The superficial temporal arteries are without  ropiness or tenderness. CV:  RRR Lungs:  CTAB Neck/HEME:  There are no carotid bruits bilaterally.  Neurological examination:  Orientation: The patient is alert and oriented x3. Cranial nerves: There is good facial symmety with facial hypomimia. The speech is fluent and clear. Soft palate rises symmetrically and there is no tongue deviation. Hearing is intact to conversational tone. Sensation: Sensation is intact to light touch throughout Motor: Strength is at least antigravity x4.  Movement examination: Tone: There ismild to mod increased tone in the LUE Abnormal movements:there is mild bilateral LE dyskinesia Coordination: There is decremation with RAM's, with hand opening and closing and toe taps on the L. Gait and Station: The patient hasnodifficulty arising out of a deep-seated chair without the use of the hands. The patient's stride length is very good (showing off) with very good arm swing (same as previous)  I have reviewed and interpreted the following labs independently    Chemistry      Component Value Date/Time   NA 137 09/15/2012 0735   K 4.0 09/15/2012 0735   CL 102 09/15/2012 0735   CO2 26 09/15/2012 0735   BUN 15 09/15/2012 0735   CREATININE 0.71 09/15/2012 0735      Component Value Date/Time   CALCIUM 8.7 09/15/2012 0735       Lab Results  Component Value Date   WBC 5.0 09/15/2012   HGB 14.6 09/15/2012   HCT 42.1 09/15/2012   MCV 94.6 09/15/2012   PLT 217 09/15/2012    No results found for: TSH   Total time spent on today's visit was 35 minutes, including both face-to-face time and nonface-to-face time.  Time included that spent on review of records (prior notes available to me/labs/imaging if pertinent), discussing treatment and goals, answering patient's questions and coordinating care.  Cc:  [name], MD

## 2021-02-07 ENCOUNTER — Ambulatory Visit: Payer: Medicare PPO | Admitting: Neurology

## 2021-02-07 ENCOUNTER — Encounter: Payer: Self-pay | Admitting: Neurology

## 2021-02-07 ENCOUNTER — Other Ambulatory Visit: Payer: Self-pay

## 2021-02-07 VITALS — BP 92/50 | HR 65 | Ht 69.5 in | Wt 170.0 lb

## 2021-02-07 DIAGNOSIS — G20 Parkinson's disease: Secondary | ICD-10-CM

## 2021-02-07 MED ORDER — CARBIDOPA-LEVODOPA 25-100 MG PO TABS: ORAL_TABLET | ORAL | 3 refills | Status: DC

## 2021-02-07 NOTE — Patient Instructions (Signed)
You have been referred to [facility] for therapy. They will call you directly to schedule an appointment.  Please call [phone] if you do not hear from them.   

## 2021-02-10 ENCOUNTER — Institutional Professional Consult (permissible substitution): Payer: Medicare PPO | Admitting: Internal Medicine

## 2021-03-02 ENCOUNTER — Ambulatory Visit: Payer: Medicare PPO | Admitting: Internal Medicine

## 2021-03-02 ENCOUNTER — Encounter: Payer: Self-pay | Admitting: Internal Medicine

## 2021-03-02 ENCOUNTER — Other Ambulatory Visit: Payer: Self-pay

## 2021-03-02 DIAGNOSIS — R55 Syncope and collapse: Secondary | ICD-10-CM | POA: Diagnosis not present

## 2021-03-02 DIAGNOSIS — G903 Multi-system degeneration of the autonomic nervous system: Secondary | ICD-10-CM | POA: Insufficient documentation

## 2021-03-02 DIAGNOSIS — I951 Orthostatic hypotension: Secondary | ICD-10-CM

## 2021-03-02 DIAGNOSIS — I959 Hypotension, unspecified: Secondary | ICD-10-CM | POA: Insufficient documentation

## 2021-03-02 HISTORY — DX: Orthostatic hypotension: I95.1

## 2021-03-02 MED ORDER — FLUDROCORTISONE ACETATE 0.1 MG PO TABS: 0.1000 mg | ORAL_TABLET | Freq: Two times a day (BID) | ORAL | 3 refills | Status: DC

## 2021-03-02 NOTE — Patient Instructions (Addendum)
Medication Instructions:  Your physician has recommended you make the following change in your medication:   1.  START taking florinef 0.1 mg-  Take one tablet at 7:00 am and one tablet at 1:00 pm.  Labwork: None ordered.  Testing/Procedures: None ordered.  Follow-Up: Your physician wants you to follow-up in: 4 months with Zachary Bunting, MD  July 04, 2021 at 9:30 am at the [facility] office  Any Other Special Instructions Will Be Listed Below (If Applicable).  If you need a refill on your cardiac medications before your next appointment, please call your pharmacy.   Fludrocortisone tablets What is this medicine? FLUDROCORTISONE (floo droe KOR ti sone) is a corticosteroid. It is used to treat Addison's disease and to treat a salt losing condition called adrenogenital syndrome. This medicine may be used for other purposes; ask your health care provider or pharmacist if you have questions. COMMON BRAND NAME(S): Florinef What should I tell my health care provider before I take this medicine? They need to know if you have any of these conditions:  Cushing's syndrome  diabetes  heart problems or disease  high blood pressure  infection like herpes, measles, tuberculosis, or chickenpox  liver disease  myasthenia gravis  osteoporosis  stomach, ulcer or intestine disease including colitis and diverticulitis  thyroid problem  an unusual or allergic reaction to fludrocortisone, corticosteroids, other medicines, lactose, foods, dyes, or preservatives  pregnant or trying to get pregnant  breast-feeding How should I use this medicine? Take this medicine by mouth with a glass of water. Follow the directions on the prescription label. Take it with food or milk to avoid stomach upset. If you are taking this medicine once a day, take it in the morning. Do not take more medicine than you are told to take. Do not suddenly stop taking your medicine because you may develop a severe  reaction. Your doctor will tell you how much medicine to take. If your doctor wants you to stop the medicine, the dose may be slowly lowered over time to avoid any side effects. Talk to your pediatrician regarding the use of this medicine in children. Special care may be needed. Patients over 27 years old may have a stronger reaction and need a smaller dose. Overdosage: If you think you have taken too much of this medicine contact a poison control center or emergency room at once. NOTE: This medicine is only for you. Do not share this medicine with others. What if I miss a dose? If you miss a dose, take it as soon as you can. If it is almost time for your next dose, take only that dose. Do not take double or extra doses. What may interact with this medicine? Do not take this medicine with any of the following medications:  mifepristone, RU-486 This medicine may also interact with the following medications:  amphotericin B  aspirin and aspirin-like drugs  barbiturates like phenobarbital  digoxin  diuretics  male hormones, like estrogens or progestins and birth control pills  male hormones  medicines for diabetes like insulin  medicines that treat or prevent blood clots like warfarin  phenytoin  rifampin  vaccines This list may not describe all possible interactions. Give your health care provider a list of all the medicines, herbs, non-prescription drugs, or dietary supplements you use. Also tell them if you smoke, drink alcohol, or use illegal drugs. Some items may interact with your medicine. What should I watch for while using this medicine? Visit your doctor  or health care professional for regular checks on your progress. If you are taking this medicine over a prolonged period, carry an identification card with your name and address, the type and dose of your medicine, and your doctor's name and address. This medicine may increase your risk of getting an infection. Stay  away from people who are sick. Tell your doctor or health care professional if you are around anyone with measles or chickenpox. If you are going to have surgery, tell your doctor or health care professional that you have taken this medicine within the last twelve months. Ask your doctor or health care professional about your diet. You may need to lower the amount of salt you eat. This medicine may increase blood sugar. Ask your healthcare provider if changes in diet or medicines are needed if you have diabetes. What side effects may I notice from receiving this medicine? Side effects that you should report to your doctor or health care professional as soon as possible:  mental depression, mood swings, mistaken feelings of self importance or of being mistreated   signs and symptoms of high blood sugar such as being more thirsty or hungry or having to urinate more than normal. You may also feel very tired or have blurry vision.  sudden weight gain  swelling of the feet or lower legs Side effects that usually do not require medical attention (report to your doctor or health care professional if they continue or are bothersome):  dizziness  headache  loss of appetite  nausea, vomiting  trouble sleeping This list may not describe all possible side effects. Call your doctor for medical advice about side effects. You may report side effects to FDA at 1-800-FDA-1088. Where should I keep my medicine? Keep out of the reach of children. Store at room temperature between 15 and 30 degrees C (59 and 86 degrees F). Protect from excessive heat. Throw away any unused medicine after the expiration date. NOTE: This sheet is a summary. It may not cover all possible information. If you have questions about this medicine, talk to your doctor, pharmacist, or health care provider.  2021 Elsevier/Gold Standard (2018-07-16 11:12:19)

## 2021-03-02 NOTE — Progress Notes (Signed)
HPI [name] is referred by Dr. Clelia Croft for evaluation of syncope. He has a long h/o Parkinsons and has begun to fall over the last 2 years. Occaisionally the episodes are associated with a loss of consciousness. He has stopped consuming ETOH and caffeine. He denies chest pain or sob. He has become more sedentary. His bp runs low. He has become more sedentary. Allergies  Allergen Reactions  . Tree Extract      Current Outpatient Medications  Medication Sig Dispense Refill  . AMBULATORY NON FORMULARY MEDICATION Transport chair Dx: Parkinsons Disease G20 1 Device 0  . carbidopa-levodopa (SINEMET CR) 50-200 MG tablet TAKE ONE TABLET AT BEDTIME. 90 tablet 1  . carbidopa-levodopa (SINEMET) 25-100 MG tablet 3 tablets at 6:30 AM/2 at 8:30 AM/2 at 10:30 AM/2 at 12:30 PM/2 at 2:30 PM/2 at 4:30pm/2 at 6:30pm.  Can take up to extra tablet (in 1/2 tabs) per day prn 420 tablet 3  . Cholecalciferol (DIALYVITE VITAMIN D 5000 PO) Take 1 tablet by mouth daily.    . Lactobacillus (PROBIOTIC ACIDOPHILUS PO) Take 1 tablet by mouth daily.    . Multiple Vitamins-Minerals (PRESERVISION AREDS 2 PO) Take 1 tablet by mouth daily.     No current facility-administered medications for this visit.     Past Medical History:  Diagnosis Date  . Allergy   . Asthma   . Parkinson's disease (HCC)    Congenital R fourth nerve palsy    ROS:   All systems reviewed and negative except as noted in the HPI.   Past Surgical History:  Procedure Laterality Date  . CATARACT EXTRACTION, BILATERAL    . COLONOSCOPY    . SHOULDER SURGERY Left    lymph nodes  . TONSILLECTOMY    . VASECTOMY       Family History  Problem Relation Age of Onset  . Dementia Mother   . Heart disease Father   . Stroke Maternal Grandmother   . Diabetes Child      Social History   Socioeconomic History  . Marital status: Married    Spouse name: Zachary [name]  . Number of children: 3  . Years of education: Ph.D  . Highest education  level: Doctorate  Occupational History  . Occupation: retired    Associate Professor: [employer]    Comment: professor - Nurse, children's  Tobacco Use  . Smoking status: Never Smoker  . Smokeless tobacco: Never Used  Vaping Use  . Vaping Use: Never used  Substance and Sexual Activity  . Alcohol use: Yes    Alcohol/week: 0.0 standard drinks    Comment: two drinks a week  . Drug use: No    Comment: hemp oil  . Sexual activity: Not on file  Other Topics Concern  . Not on file  Social History Narrative   Pt lives at home with family.   Caffeine Use: 1-2 cups daily.   Right handed   Social Determinants of Health   Financial Resource Strain: Not on file  Food Insecurity: Not on file  Transportation Needs: Not on file  Physical Activity: Not on file  Stress: Not on file  Social Connections: Not on file  Intimate Partner Violence: Not on file     BP (!) 82/56   Pulse 72   Ht 5' 9.5" (1.765 m)   Wt 168 lb 3.2 oz (76.3 kg)   SpO2 97%   BMI 24.48 kg/m  BP dropped into the 60's with standing!  Physical Exam:  Well appearing NAD HEENT: Unremarkable Neck:  6 cm JVD, no thyromegally Lymphatics:  No adenopathy Back:  No CVA tenderness Lungs:  Clear with no wheezes HEART:  Regular rate rhythm, no murmurs, no rubs, no clicks Abd:  soft, positive bowel sounds, no organomegally, no rebound, no guarding Ext:  2 plus pulses, no edema, no cyanosis, no clubbing Skin:  No rashes no nodules Neuro:  CN II through XII intact, motor grossly intact  EKG - nsr   Assess/Plan: 1. 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 I asked him to increase his salt intake. He had tried to avoid the salt. I asked him to start florinef. If his symptoms do not improve we will attempt to get Northera approved.  2. Parkinson's - his neuromuscular symptoms appear to be fairly well controlled.  Sharlot Gowda Maythe Deramo,MD

## 2021-03-13 ENCOUNTER — Telehealth: Payer: Self-pay | Admitting: Neurology

## 2021-03-13 NOTE — Telephone Encounter (Signed)
1. Patient and wife came by the office. He's been recommended fludrocortisone tablets from Dr. Lewayne Bunting for low blood pressure. They want to be sure Dr. Arbutus Leas recommends it before he takes it?  2. Patient's wife requesting a call back with concerns about the patient's carbidopa levodopa prescription. A copy was made of a printout with questions re: dosage and ingredient. Place in Dr. Don Perking box.  3. Application for a disability placard dropped off and placed in Dr. Don Perking box.

## 2021-03-14 NOTE — Telephone Encounter (Signed)
Placcard form filled out, and prescription concerns paperwork placed on Dr. Don Perking desk for review.

## 2021-03-14 NOTE — Telephone Encounter (Signed)
I'm good and agree with the florinef from dr. Ladona Ridgel.  I don't see any paperwork

## 2021-03-14 NOTE — Telephone Encounter (Signed)
Patient and wife brought in a handwritten dosing schedule that they would like to change to.  Essentially, they want to change their 8:30 AM/12:30 PM/4:30 PM dosage so that they take 1 tablet of carbidopa/levodopa 25/100 and 1 tablet of Stalevo.  This really does not make much sense to me given that the active component is still the same and the frequency that they are proposing is the same.  They are stating that this would improve his "quality of life" but I do not see how that would do that given that the frequency of dosing that they are proposing is the same.  In addition, we took him off of the entacapone because of memory issues.  We could change him to Rytary, but in my experience patients who have been on carbidopa/levodopa and are changed to Rytary are generally not that happy with this change, but it generally would decrease the frequency of medication.  This would require an in person visit.  They are just adding Florinef from Dr. Ladona Ridgel and I think that they should trial this change first.

## 2021-03-15 NOTE — Telephone Encounter (Signed)
Called and spoke to patients wife. Informed her that the signed placard form has been signed and placed at the front desk for pick up.   Also informed patients wife on Dr. Don Perking recommendations and comments on their request of medication concerns and questions. Patients wife stated that all she is asking for is 1 pill to be changed because it works well for patient. Patients wife stated that the 25/100 Carbidopa Levodopa 3x a day is so good for him and has improved his quality of life. Patients wife stated that the cut back on Entacapone gives him a boost. Patients wife also stated he was taken off the entacapone due to maniac behavior and not memory issues. Patients wife informed me that she had "patients old pills of carbo/Levopa and inserted one tablet 3x a day and it has made a nice difference and works."   Advised patients wife that Dr. Arbutus Leas would like patient to Trial Dr. Lubertha Basque change first. Patients wife is agreeable to that suggestion by Dr. Arbutus Leas. Patients wife is also aware that an in person visit will be needed to address medication issues and change questions.   Patients wife had no further questions or concerns and understood what was explained to her per Dr. Arbutus Leas.

## 2021-03-21 ENCOUNTER — Other Ambulatory Visit: Payer: Self-pay

## 2021-03-21 ENCOUNTER — Ambulatory Visit: Payer: Medicare PPO | Admitting: Psychology

## 2021-03-21 ENCOUNTER — Ambulatory Visit (INDEPENDENT_AMBULATORY_CARE_PROVIDER_SITE_OTHER): Payer: Medicare PPO | Admitting: Psychology

## 2021-03-21 ENCOUNTER — Encounter: Payer: Self-pay | Admitting: Psychology

## 2021-03-21 DIAGNOSIS — R4189 Other symptoms and signs involving cognitive functions and awareness: Secondary | ICD-10-CM

## 2021-03-21 DIAGNOSIS — F0280 Dementia in other diseases classified elsewhere without behavioral disturbance: Secondary | ICD-10-CM | POA: Diagnosis not present

## 2021-03-21 DIAGNOSIS — G3183 Dementia with Lewy bodies: Secondary | ICD-10-CM

## 2021-03-21 DIAGNOSIS — R296 Repeated falls: Secondary | ICD-10-CM | POA: Insufficient documentation

## 2021-03-21 DIAGNOSIS — G20 Parkinson's disease: Secondary | ICD-10-CM

## 2021-03-21 HISTORY — DX: Dementia in other diseases classified elsewhere, unspecified severity, without behavioral disturbance, psychotic disturbance, mood disturbance, and anxiety: F02.80

## 2021-03-21 HISTORY — DX: Parkinson's disease: G20

## 2021-03-21 HISTORY — DX: Dementia with Lewy bodies: G31.83

## 2021-03-21 NOTE — Progress Notes (Signed)
NEUROPSYCHOLOGICAL EVALUATION [place]. [facility] Department of Neurology  Date of Evaluation: Mar 21, 2021  Reason for Referral:   Zachary [name] is a 70 y.o. 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   Assessment and Plan:   Clinical Impression(s): Zachary [name] pattern of performance is suggestive of primary deficits surrounding processing speed, executive functioning, verbal fluency, and visual discrimination. Specific to executive dysfunction, Zachary [name] also exhibited significant impulsivity while completing tasks, often attempting to start tasks before instructions were read or before the psychometrist was ready. Further performance variability was noted across attention/concentration and both encoding (i.e., learning) and retrieval aspects of memory. Performance was appropriate across receptive language, confrontation naming, and visuoconstructional tasks. Relative to his previous evaluation, there were generally mild performance declines across processing speed, executive functioning, phonemic fluency, and certain aspects of a list-based memory task. However, all other scores exhibited good stability. Regarding ADLs, Zachary [name] no longer drives due to cognitive-based concerns. His wife also fully manages medications, finances, and bill paying, which is a change from longstanding patterns. Diagnostically, Zachary [name] is likely on the border between mild and major neurocognitive disorder designations. Ongoing ADL dysfunction strongly points towards the latter. While cognitive dysfunction is not notably impaired relative to age-matched peers, I do feel that below average scores and below are significant  deviations from premorbid levels given his educational and vocational history. As such, I do feel that he best meets criteria for a Major Neurocognitive Disorder ("dementia"). However, he is likely towards the milder end of this spectrum at the present time.   Regarding etiology, the most likely culprit for ongoing dysfunction continues to be his history of Parkinson's disease (i.e., Parkinson's disease dementia). His frontal subcortical pattern of dysfunction with additional visuospatial involvement is consistent with what would typically be expected. I cannot rule out Lewy body dementia given that he did report prior visual hallucinations and REM sleep behaviors. However, these seem to be happening more and more infrequently and there is less visuospatial impairment and greater overall stability than what would generally be expected across testing. As such, I feel that a Parkinson's disease dementia presentation fits best at the present time. While frequent falling can be concerning for progressive supranuclear palsy (PSP), these falls appear better accounted for by orthostatic hypotension and dehydration. There is also no record of ongoing oculomotor dysfunction, making this condition much less likely. Cerebrovascular disease could be influencing ongoing performances. However, there is no recent neuroimaging which I was able to review and prior scans in 2018 revealed largely age-appropriate white matter changes, making the likelihood of this contribution small. Despite variability across memory tasks, there was not evidence for rapid forgetting or a memory storage deficit, making co-occurring Alzheimer's unlikely. Continued medical monitoring will be important moving forward.  Recommendations: A repeat neuropsychological evaluation in 24 months (or sooner if functional decline is noted) is recommended to assess the trajectory of future cognitive decline should it occur. This will also aid in future efforts  towards improved diagnostic clarity.  Zachary [name] will likely benefit from the establishment and maintenance of a routine in order to maximize functional abilities over time.  It will be important for him to have another person with him when in situations where he may need to process  information, weigh the pros and cons of different options, and make decisions, in order to ensure that he fully understands and recalls all information to be considered.  If not already done, Zachary [name] and his family may want to discuss his wishes regarding durable power of attorney and medical decision making, so that he can have input into these choices.  While performance across neurocognitive testing is admittedly not a strong predictor of an individual's safety operating a motor vehicle, I do agree with his family's actions regarding emphasizing abstinence based upon current test scores. Should his family wish to pursue a formalized driving evaluation, they would be encouraged to contact [employer] in [place], [place] at [phone]. Another option would be through [facility]; however, the latter would likely require a referral from a medical doctor. [facility] can be reached directly at [phone].   When learning new information, he would benefit from information being broken up into small, manageable pieces. He may also find it helpful to articulate the material in his own words and in a context to promote encoding at the onset of a new task. This material may need to be repeated multiple times to promote encoding.  Memory can be improved using internal strategies such as rehearsal, repetition, chunking, mnemonics, association, and imagery. External strategies such as written notes in a consistently used memory journal, visual and nonverbal auditory cues such as a calendar on the refrigerator or appointments with alarm, such as on a cell phone, can also help maximize recall.    To  address problems with processing speed, he may wish to consider:   -Ensuring that he is alerted when essential material or instructions are being presented   -Adjusting the speed at which new information is presented   -Allowing for more time in comprehending, processing, and responding in conversation  To address problems with fluctuating attention, he may wish to consider:   -Avoiding external distractions when needing to concentrate   -Limiting exposure to fast paced environments with multiple sensory demands   -Writing down complicated information and using checklists   -Attempting and completing one task at a time (i.e., no multi-tasking)   -Verbalizing aloud each step of a task to maintain focus   -Reducing the amount of information considered at one time  Review of Records:   Zachary [name] completed a comprehensive neuropsychological evaluation Koleen Distance, Psy.D.) on 11/26/2017. Across testing, many performances fell within the average range which could have represented mild decline from premorbid abilities. He demonstrated clinically impaired performances on tests of semantic verbal fluency and there were behavioral indications of executive dysfunction (e.g., Trails B errors, as well as significant impulsivity throughout the testing session). Dr. Alinda Dooms expressed surprise that testing did not reveal more impairment given reported difficulties with instrumental ADLs and daily functioning. However, she suspected that his high level of cognitive reserve may be inflating test scores. Test results were said to warrant a diagnosis of mild cognitive impairment, likely secondary to Parkinson's disease. Repeat testing was recommended.   Zachary [name] was seen by [facility] Lurena Joiner [name], D.O.) on 02/07/2021 for follow-up of Parkinson's disease. He was last seen about a month prior. At that time, Dr. Arbutus Leas took him off of Stalevo and changed him solely to levodopa. Behaviorally, reports  suggested that he has continued to be stiff and slow. However, he and his wife did report that they started the 3 in the AM instead of 2. He has reportedly been doing better  in the AM and is slow at night due to his reduced dose in evening to accommodate. There has been only one reported fall since his prior visit. His wife did report feeling as though he has been a bit clearer since entacapone was discontinued. During his current movement exam, there wasmild to moderate increased tone in the LUE, mild bilateral LE dyskinesia, and decremation with RAM's and with hand opening and closing and toe taps on the L. There was nodifficulty arising out of a deep-seated chair without the use of the hands. His stride length was said to be good with very good arm swing (same as previous). 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 He has also been dealing with low blood pressure readings with Dr. 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   Current prescribed movement disorder medications: Carbidopa/levodopa 25/100, 2 tablets at 6:30 AM (he stated that he is doing 3 at this hour)/2 at 8:30 AM/2 at 10:30 AM/2 at 12:30 PM/2 at 2:30 PM/2 at 4:30pm/2 at 6:30pm (only doing 1 at this time since he started doing 3 in the AM); also Carbidopa/levodopa 50/200 at bedtime.  Ultimately, Zachary [name] was referred for a repeat neuropsychological evaluation to characterize his cognitive abilities and to assist with diagnostic clarity and treatment planning.   Brain MRI on 03/28/2017 revealed mild cerebral atrophy and chronic microvascular changes in the cerebral white matter, said to be largely age-appropriate.  Past Medical History:  Diagnosis Date  . Allergy   . Asthma   . Chest pain 09/22/2012  . Frequent falls   . Hearing loss 04/29/2019   utilizes bilateral hearing aids  . Mild neurocognitive disorder due to Parkinson's disease 11/26/2017  . Nocturia 04/15/2018  .  Orthostatic hypotension 03/02/2021  . Parkinson's disease 06/1999   congenital R fourth nerve palsy    Past Surgical History:  Procedure Laterality Date  . CATARACT EXTRACTION, BILATERAL    . COLONOSCOPY    . SHOULDER SURGERY Left    lymph nodes  . TONSILLECTOMY    . VASECTOMY      Current Outpatient Medications:  .  AMBULATORY NON FORMULARY MEDICATION, Transport chair Dx: Parkinsons Disease G20, Disp: 1 Device, Rfl: 0 .  carbidopa-levodopa (SINEMET CR) 50-200 MG tablet, TAKE ONE TABLET AT BEDTIME., Disp: 90 tablet, Rfl: 1 .  carbidopa-levodopa (SINEMET) 25-100 MG tablet, 3 tablets at 6:30 AM/2 at 8:30 AM/2 at 10:30 AM/2 at 12:30 PM/2 at 2:30 PM/2 at 4:30pm/2 at 6:30pm.  Can take up to extra tablet (in 1/2 tabs) per day prn, Disp: 420 tablet, Rfl: 3 .  Cholecalciferol (DIALYVITE VITAMIN D 5000 PO), Take 1 tablet by mouth daily., Disp: , Rfl:  .  fludrocortisone (FLORINEF) 0.1 MG tablet, Take 1 tablet (0.1 mg total) by mouth 2 (two) times daily. Take one tablet at 7:00 am and one tablet at 1:00 pm daily, Disp: 180 tablet, Rfl: 3 .  Lactobacillus (PROBIOTIC ACIDOPHILUS PO), Take 1 tablet by mouth daily., Disp: , Rfl:  .  Multiple Vitamins-Minerals (PRESERVISION AREDS 2 PO), Take 1 tablet by mouth daily., Disp: , Rfl:   Clinical Interview:   The following information was obtained during a clinical interview with Zachary [name] and his wife prior to cognitive testing.  Cognitive Symptoms: Decreased short-term memory: Endorsed. Since his previous evaluation in 2019, Zachary [name] did acknowledge feeling as though he has lost some memory capabilities. He noted that his bridge playing has declined and has been  forced to place trackers on his phone due to him misplacing or losing it more frequently. His wife added that he has seemed to have worsening difficulties recalling names or details of previous conversations.  Decreased long-term memory: Denied. Decreased attention/concentration: Endorsed. He  reported trouble with sustained attention, as well as increased distractibility. The former was said to notably impact reading comprehension.  Reduced processing speed: Endorsed. Difficulties with executive functions: Endorsed. He reported trouble with indecision and organization. He also endorsed trouble with impulsivity. When asked for an example, he stated that there are times where he "tend[s] to get manic." However, this was followed by him theorizing that these difficulties were perhaps medication side effects. His wife did then state that some erratic behaviors could be due to medications. No personality changes were reported.  Difficulties with emotion regulation: Denied. Difficulties with receptive language: Denied. Difficulties with word finding: Endorsed. Decreased visuoperceptual ability: Endorsed. He noted that he will often execute turns too early and hit things (e.g., furniture) while ambulating around his environment.   Trajectory of deficits: Broad cognitive dysfunction was said to have gradually worsened since his 2019 evaluation in all affected domains.   Difficulties completing ADLs: Endorsed. His wife manages and organizes his medications. Even with this, he does have trouble remembering to take his medications. His wife provided an example where he will go and turn off his alarm which serves as a reminder to take medications, write down that he has taken his medications, but then forget to actually take them. His wife also manages their finances and is responsible for bill paying. Both of these represent changes from previous functioning. He does not drive. His wife reported that this was due to concerns surrounding reaction speed. He added that his children did not feel comfortable with his grandchildren in the car with him while he was driving which led to his cessation.   Additional Medical History: History of traumatic brain injury/concussion: Denied. History of stroke:  Denied. History of seizure activity: Denied. History of known exposure to toxins: Denied. Symptoms of chronic pain: Denied. Experience of frequent headaches/migraines: Denied. Frequent instances of dizziness/vertigo: Endorsed. Episodes were often attributed to episodes of low blood pressure and dehydration. Medical records suggest concerns surrounding neurogenic orthostatic hypotension, which could explain these instances.   Sensory changes: He wears glasses with generally positive effect. However, he did report that his vision is "fuzzy" at times. He utilizes hearing aids with generally positive effect. He reported losing his sense of taste and smell many years previously.  Balance/coordination difficulties: Endorsed. In the not so distant past, he acknowledged frequent falling behaviors. As stated above, this was generally related to episodes of low blood pressure and dehydration. More recently, he described a few instances while on the golf course where he fell to his knees. Recent instances of complete falls was denied. Outside of falling, he did report ongoing balance instability and prominent freezing of his lower extremities from time to time.  Other motor difficulties: Subtle to mild tremors in his upper extremities were acknowledged. However, these do not seem to be present or impactful at all times.  Sleep History: Estimated hours obtained each night: 8+ hours. Difficulties falling asleep: Denied. Difficulties staying asleep: Endorsed. As stated above, there is a history of REM sleep behaviors. His wife noted that there are nights where he does engage in excessive movement while asleep. She also described an instance where he got up in the middle of the night, walked to the restroom, and  then was found sleeping in that area. These behaviors have seemed to improve lately and his wife wondered if medication side effects may have been worsening his experience previously.  Feels rested and  refreshed upon awakening: Endorsed. However, he will commonly take a mid-morning nap after breakfast.   History of snoring: Denied. History of waking up gasping for air: Denied. Witnessed breath cessation while asleep: Denied.  History of vivid dreaming: Denied. Excessive movement while asleep: Endorsed. Instances of acting out his dreams: Endorsed (see above).   Psychiatric/Behavioral Health History: Depression: He described his current mood as "very solid" and denied to his knowledge any prior mental health concerns or diagnoses. His wife noted that he may be experiencing frustration or dissatisfaction surrounding his loss of functionality and inability to control certain aspects of his life. Current or remote suicide ideation, intent, or plan was denied.  Anxiety: Denied. Mania: Denied. Trauma History: Denied. Visual/auditory hallucinations: Endorsed. Prior medical records suggest the experience of some visual hallucinations (e.g., him talking to people who are not there). These have always been "few and far between" per his wife. Lately, symptoms seem to have improved with hallucinations occurring even less frequently.  Delusional thoughts: Denied.  Tobacco: Denied. Alcohol: He reported very rare alcohol consumption and denied a history of problematic alcohol abuse or dependence.  Recreational drugs: Denied.  Family History: Problem Relation Age of Onset  . Alzheimer's disease Mother   . Heart disease Father   . Tremor Father   . Stroke Maternal Grandmother   . Diabetes Child    This information was confirmed by Mr. Chaudhary.  Academic/Vocational History: Highest level of educational attainment: 20 years. Zachary [name] earned a Ph.D. in Nurse, children's. He described himself as a strong (mostly A) student in academic settings. English-based courses were likely relative weaknesses in earlier academic settings; however, he still was able to perform well.  History of developmental delay:  Denied. History of grade repetition: Denied. Enrollment in special education courses: Denied. History of LD/ADHD: Denied.  Employment: Retired. 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   Evaluation Results:   Behavioral Observations: Zachary [name] was accompanied by his wife, arrived to his appointment on time, and was appropriately dressed and groomed. He appeared alert and oriented. When ambulating, there appeared to be a brief pause after standing and before moving forward. When he did move, he moved quickly and occasionally impulsively, and somewhat carelessly. He often held his cane in his hands and carried this device rather than used it for additional stabilization. A very subtle tremor was observed in his left hand while he was sitting during interview. No other abnormal movements were observed. His affect was generally relaxed and positive. Spontaneous speech was fluent and word finding difficulties were not consistently observed during the clinical interview. Thought processes were coherent, organized, and normal in content. Insight into his cognitive difficulties appeared adequate.   During testing, he was noted to be very impulsive, often attempting to start tasks prior to the instructions being finished or the psychometrist instructing him to do so. He also made numerous errors due to said impulsivity, some of which he realized quickly and self-corrected. Sustained attention was appropriate. Task engagement was adequate and he persisted when challenged. He did require additional instruction clarification surrounding the latter conditions of the D-KEFS Color Word task. He also appeared to become overwhelmed while filling out a depression questionnaire and subsequently took a bathroom break to collect himself. At the conclusion of  testing, he made an odd comment to the psychometrist surrounding a desire to cross-dress. No context surrounding this comment was provided, not was  there any mention of this during interview. Overall, Mr. Brunton was cooperative with the clinical interview and subsequent testing procedures.   Adequacy of Effort: The validity of neuropsychological testing is limited by the extent to which the individual being tested may be assumed to have exerted adequate effort during testing. Zachary [name] expressed his intention to perform to the best of his abilities and exhibited adequate task engagement and persistence. Scores across stand-alone and embedded performance validity measures were within expectation. As such, the results of the current evaluation are believed to be a valid representation of Zachary [name] current cognitive functioning.  Test Results: Zachary [name] was fully oriented at the time of the current evaluation.  Intellectual abilities based upon educational and vocational attainment were estimated to be in the above average range. Premorbid abilities were estimated to be within the average range based upon a single-word reading test.   Processing speed was variable, ranging from the exceptionally low to average normative ranges. Basic attention was well below average. More complex attention (e.g., working memory) was below average to average. Executive functioning was exceptionally low to well below average across tasks assessing cognitive flexibility and response inhibition. Abstract reasoning scored in the average range.  While not directly assessed, receptive language abilities were believed to be intact. Likewise, Mr. Sauseda had minimal difficulty comprehending task instructions and answered all questions asked of him appropriately. Assessed expressive language (e.g., verbal fluency and confrontation naming) was variable. Verbal fluency was well below average to below average, while confrontation naming was above average.     Assessed visuospatial/visuoconstructional abilities were also variable. Constructional tasks were generally appropriate. He  did lose points on his drawing of a clock due to some irregular numerical spacing, the numbers being placed outside of the clock face, and no size differentiation between clock hands. He performed in the exceptionally low range across a task assessing visual discrimination abilities.    Learning (i.e., encoding) of novel verbal information was well below average across a list learning task, but average across a story-based task. Spontaneous delayed recall (i.e., retrieval) of previously learned information was below average to average. Retention rates were 81% across a story learning task, 67% (spontaneous) to 100% (cued) across a list learning task, and 47% across a figure recall task. Performance across recognition tasks was below average to average, suggesting evidence for information consolidation.   Results of emotional screening instruments suggested that recent symptoms of generalized anxiety were in the minimal range, while symptoms of depression were within normal limits. A screening instrument assessing recent sleep quality suggested the presence of minimal sleep dysfunction.  Tables of Scores:   Note: This summary of test scores accompanies the interpretive report and should not be considered in isolation without reference to the appropriate sections in the text. Descriptors are based on appropriate normative data and may be adjusted based on clinical judgment. The terms "impaired" and "within normal limits (WNL)" are used when a more specific level of functioning cannot be determined. Descriptors refer to the current evaluation only.         Validity Testing:    [facility]   January 2019 Current    Dot Counting Test: --- --- --- Within Expectation  WAIS-IV Reliable Digit Span: --- --- --- Within Expectation  CVLT-III Forced Choice Recognition: --- --- --- Within Expectation  D-KEFS Color Word Effort Index: --- --- ---  Within Expectation        Orientation:       Raw Score Raw Score  Percentile   NAB Orientation, Form 1 --- 29/29 --- ---        Cognitive Screening:       Raw Score Raw Score Percentile   SLUMS: --- 25/30 --- ---        Intellectual Functioning:             Standard Score Standard Score Percentile   Test of Premorbid Functioning: 104 100 50 Average        Memory:            Wechsler Memory Scale (WMS-IV):                       Raw Score Raw Score (Scaled Score) Percentile     Logical Memory I 25/53 29/53 (9) 37 Average    Logical Memory II 15/39 17/39 (10) 50 Average    Logical Memory Recognition 21/23 20/23 >75 Above Average        [place] Verbal Learning Test (CVLT-III) Brief Form: Raw Score Raw Score (Scaled/Standard Score) Percentile     Total Trials 1-4 25/36 19/36 (73) 4 Well Below Average    Short-Delay Free Recall 6/9 5/9 (6) 9 Below Average    Long-Delay Free Recall 4/9 4/9 (6) 9 Below Average    Long-Delay Cued Recall 5/9 6/9 (8) 25 Average      Recognition Hits 8/9 7/9 (7) 16 Below Average      False Positive Errors 2 2 (7) 16 Below Average         Raw Score Raw Score (Scaled Score) Percentile   RBANS Figure Copy: 19/20 19/20 (12) 75 Above Average  RBANS Figure Recall: 11/20 9/20 (8) 25 Average        Attention/Executive Function:            Trail Making Test: Raw Score Raw Score (T Score) Percentile     Part A 31 secs.,  0 errors 31 secs.,  0 errors (50) 50 Average    Part B 126 secs.,  3 errors 141 secs.,  2 errors (35) 7 Well Below Average         Symbol Digit Modalities Test: Z-Score Raw Score (Z-Score) Percentile     Written -0.8 19 (-2.17) 2 Exceptionally Low    Oral -0.4 29 (-1.76) 4 Well Below Average         Scaled Score Scaled Score Percentile   WAIS-IV Digit Span: Below Average    Forward --- 5 5 Well Below Average    Backward --- 10 50 Average    Sequencing --- 7 16 Below Average         Scaled Score Scaled Score Percentile   WAIS-IV Similarities: 10 9 37 Average        D-KEFS Color-Word  Interference Test: Raw Score Raw Score (Scaled Score) Percentile     Color Naming --- 45 secs. (5) 5 Well Below Average    Word Reading --- 30 secs. (8) 25 Average    Inhibition --- 106 secs. (5) 5 Well Below Average      Total Errors --- 9 errors (4) 2 Well Below Average    Inhibition/Switching --- 110 secs. (6) 9 Below Average      Total Errors --- 3 errors (10) 50 Average        D-KEFS Verbal Fluency Test: Raw Score  Raw Score (Scaled Score) Percentile     Letter Total Correct --- 32 (9) 37 Average    Category Total Correct --- 27 (8) 25 Average    Category Switching Total Correct --- 3 (1) <1 Exceptionally Low    Category Switching Accuracy --- 1 (1) <1 Exceptionally Low      Total Set Loss Errors --- 2 (10) 50 Average      Total Repetition Errors --- 5 (8) 25 Average        Language:            Verbal Fluency Test: Raw Score Raw Score (T Score) Percentile     Phonemic Fluency (FAS) 67 32 (39) 14 Below Average    Animal Fluency 13 13 (32) 4 Well Below Average         NAB Language Module, Form 1: T Score T Score Percentile     Naming --- 31/31 (57) 75 Above Average         Raw Score Raw Score Percentile   [place] Naming Test: 54/60 --- --- ---        Visuospatial/Visuoconstruction:       Raw Score Raw Score Percentile   Clock Drawing: Mildly impaired 7/10 --- Within Normal Limits        NAB Spatial Module, Form 1: T Score T Score Percentile     Visual Discrimination --- 26 1 Exceptionally Low         Scaled Score Scaled Score Percentile   WAIS-IV Block Design: 10 10 50 Average        Mood and Personality:       Raw Score Raw Score Percentile   PROMIS Depression Questionnaire: --- 15 --- None to Slight  PROMIS Anxiety Questionnaire: --- 7 --- None to Slight        Additional Questionnaires:       Raw Score Raw Score Percentile   PROMIS Sleep Disturbance Questionnaire: --- 20 --- None to Slight   Informed Consent and Coding/Compliance:   The current evaluation  represents a clinical evaluation for the purposes previously outlined by the referral source and is in no way reflective of a forensic evaluation.   Mr. Goodrich was provided with a verbal description of the nature and purpose of the present neuropsychological evaluation. Also reviewed were the foreseeable risks and/or discomforts and benefits of the procedure, limits of confidentiality, and mandatory reporting requirements of this provider. The patient was given the opportunity to ask questions and receive answers about the evaluation. Oral consent to participate was provided by the patient.   This evaluation was conducted by Newman Nickels, Ph.D., licensed clinical neuropsychologist. Mr. Innes completed a clinical interview with Dr. Milbert Coulter, billed as one unit [phone], and 135 minutes of cognitive testing and scoring, billed as one unit [phone] and four additional units 96139. Psychometrist Wallace Keller, B.S., assisted Dr. Milbert Coulter with test administration and scoring procedures. As a separate and discrete service, Dr. Milbert Coulter spent a total of 170 minutes in interpretation and report writing billed as one unit [phone] and two units 96133.

## 2021-03-21 NOTE — Progress Notes (Signed)
   Psychometrician Note   Cognitive testing was administered to Zachary [name] by Wallace Keller, B.S. ([name]) under the supervision of Dr. Newman Nickels, Ph.D., licensed psychologist on 03/21/21. Zachary [name] did not appear overtly distressed by the testing session per behavioral observation or responses across self-report questionnaires. Rest breaks were offered.    The battery of tests administered was selected by Dr. Newman Nickels, Ph.D. with consideration to Zachary [name] current level of functioning, the nature of his symptoms, emotional and behavioral responses during interview, level of literacy, observed level of motivation/effort, and the nature of the referral question. This battery was communicated to the [name]. Communication between Dr. Newman Nickels, Ph.D. and the [name] was ongoing throughout the evaluation and Dr. Newman Nickels, Ph.D. was immediately accessible at all times. Dr. Newman Nickels, Ph.D. provided supervision to the [name] on the date of this service to the extent necessary to assure the quality of all services provided.    Zachary [name] will return within approximately 1-2 weeks for an interactive feedback session with Dr. Milbert Coulter at which time his test performances, clinical impressions, and treatment recommendations will be reviewed in detail. Zachary [name] understands he can contact our office should he require our assistance before this time.  A total of 135 minutes of billable time were spent face-to-face with Zachary [name]. This includes both test administration and scoring time. Billing for these services is reflected in the clinical report generated by Dr. Newman Nickels, Ph.D.  This note reflects time spent with the psychometrician and does not include test scores or any clinical interpretations made by Dr. Milbert Coulter. The full report will follow in a separate note.

## 2021-03-22 ENCOUNTER — Encounter: Payer: Self-pay | Admitting: Psychology

## 2021-03-22 DIAGNOSIS — G20 Parkinson's disease: Secondary | ICD-10-CM | POA: Insufficient documentation

## 2021-03-22 DIAGNOSIS — F0280 Dementia in other diseases classified elsewhere without behavioral disturbance: Secondary | ICD-10-CM | POA: Insufficient documentation

## 2021-03-22 DIAGNOSIS — G3183 Dementia with Lewy bodies: Secondary | ICD-10-CM | POA: Insufficient documentation

## 2021-03-22 NOTE — Progress Notes (Signed)
Assessment/Plan:   1.  Parkinsons Disease  -take Carbidopa/levodopa 25/100, 3 tablets at 6:30 AM/2 at 8:30 AM/2 at 10:30 AM/2 at 12:30 PM/2 at 2:30 PM/2 at 4:30pm/2 at 6:30pm.  Can take up to extra tablet (in 1/2 tabs) per day prn  -okay to take entacapone 200 mg with 8:30am/12:30pm/4:30pm dose but we will need to watch cognition with time  -discussed opicapone  -discussed dose failure and can re-dose prn  -Continue carbidopa/levodopa 50/200 at bed.  2.  RBD             -Safety discussed             -Was on 0.25 mg of clonazepam in the past.  They discontinued as pharmacist told them it was addictive.  May need to address in the future.  3.    Memory change             -Patient had neurocognitive testing with Dr. Milbert Coulter on May 24 and demonstrated PDD.  Patient actually did fairly well on the testing, but because of some troubles with ADLs and high level of education, they actually classified it with mild PDD  4.  Low BP             -Following with Dr. Ladona Ridgel.  Now on Florinef, 0.1 mg twice daily but his BP was really low today but they admit haven't taken a 2nd dose yet today.  Discussed why 2nd dose is dosed at noon and not bedtime and they expressed understanding.  Discussed concept of supine HTN   Subjective:   Zachary [name] was seen today in follow up for Parkinsons disease.  My previous records were reviewed prior to todays visit as well as outside records available to me.  Patient worked in today.  He had neurocognitive testing done May 24 with Dr. Milbert Coulter.  I personally spoke with Dr. Milbert Coulter about this.  Patient actually did fairly well on the testing, but because of some troubles with ADLs, they actually classified it with mild PDD.  There have been wanting to go back on the entacapone.  They have wanted to change 3 of the dosages of levodopa (8:30am/12:30pm/4:30pm) such that they take 1 tablet of immediate release levodopa and 1 tablet of Stalevo.  They feel that he does better on  that.  He was having freezing spells with regular levodopa and feel that he is doing so much better with combo of the stalevo/and levodopa.  They describe cognition as "fuzzy" when on all of the IR but she does state that it may be better on the low dose of entacapone and levodopa than prior because he is less lethargic.    Since our last visit, Dr. Ladona Ridgel did start him on Florinef.  bp low today.  Hasn't taken 2nd dose of florinef.     Current prescribed movement disorder medications: Carbidopa/levodopa 25/100, 3 tablets at 6:30 AM/2 at 8:30 AM/2 at 10:30 AM/2 at 12:30 PM/2 at 2:30 PM/2 at 4:30pm/2 at 6:30pm.   Carbidopa/levodopa 50/200 at bedtime   PREVIOUS MEDICATIONS: amantadine;Azilect;Inbrija;carbidopa/levodopa 25/100; carbidopa/levodopa 50/200;Stalevo; clonazepam (was only on 0.25 mg at bedtime and they discontinued as pharmacist told them it was addictive)  ALLERGIES:   Allergies  Allergen Reactions  . Tree Extract     CURRENT MEDICATIONS:  Outpatient Encounter Medications as of 03/23/2021  Medication Sig  . AMBULATORY NON FORMULARY MEDICATION Transport chair Dx: Parkinsons Disease G20  . carbidopa-levodopa (SINEMET CR) 50-200 MG tablet TAKE ONE TABLET  AT BEDTIME.  . carbidopa-levodopa (SINEMET) 25-100 MG tablet 3 tablets at 6:30 AM/2 at 8:30 AM/2 at 10:30 AM/2 at 12:30 PM/2 at 2:30 PM/2 at 4:30pm/2 at 6:30pm.  Can take up to extra tablet (in 1/2 tabs) per day prn  . Cholecalciferol (DIALYVITE VITAMIN D 5000 PO) Take 1 tablet by mouth daily.  . fludrocortisone (FLORINEF) 0.1 MG tablet Take 1 tablet (0.1 mg total) by mouth 2 (two) times daily. Take one tablet at 7:00 am and one tablet at 1:00 pm daily  . Lactobacillus (PROBIOTIC ACIDOPHILUS PO) Take 1 tablet by mouth daily.  . Multiple Vitamins-Minerals (PRESERVISION AREDS 2 PO) Take 1 tablet by mouth daily.   No facility-administered encounter medications on file as of 03/23/2021.    Objective:   PHYSICAL EXAMINATION:     VITALS:   Vitals:   03/23/21 1504  BP: (!) 86/54  Pulse: 68  SpO2: 98%  Weight: 170 lb (77.1 kg)  Height: 5' 9.5" (1.765 m)    GEN:  The patient appears stated age and is in NAD. HEENT:  Normocephalic, atraumatic.  The mucous membranes are moist. The superficial temporal arteries are without ropiness or tenderness. CV:  RRR Lungs:  CTAB Neck/HEME:  There are no carotid bruits bilaterally.  Neurological examination:  Orientation: The patient is alert and oriented x3. Cranial nerves: There is good facial symmety with facial hypomimia. The speech is fluent and clear. Soft palate rises symmetrically and there is no tongue deviation. Hearing is intact to conversational tone. Sensation: Sensation is intact to light touch throughout Motor: Strength is at least antigravity x4.  Movement examination: Tone: There ismild to mod increased tone in the LUE Abnormal movements:none Coordination: There is milddecremation with RAM's, with hand opening and closing and toe taps on the L. Gait and Station: The patient hasnodifficulty arising out of a deep-seated chair without the use of the hands. The patient's stride length is very good (showing off) with very good arm swing (same as previous)  I have reviewed and interpreted the following labs independently    Chemistry      Component Value Date/Time   NA 137 09/15/2012 0735   K 4.0 09/15/2012 0735   CL 102 09/15/2012 0735   CO2 26 09/15/2012 0735   BUN 15 09/15/2012 0735   CREATININE 0.71 09/15/2012 0735      Component Value Date/Time   CALCIUM 8.7 09/15/2012 0735       Lab Results  Component Value Date   WBC 5.0 09/15/2012   HGB 14.6 09/15/2012   HCT 42.1 09/15/2012   MCV 94.6 09/15/2012   PLT 217 09/15/2012    No results found for: TSH   Total time spent on today's visit was 38 minutes, including both face-to-face time and nonface-to-face time.  Time included that spent on review of records (prior notes available  to me/labs/imaging if pertinent), discussing treatment and goals, answering patient's questions and coordinating care.  Cc:  [name], MD

## 2021-03-23 ENCOUNTER — Ambulatory Visit: Payer: Medicare PPO | Admitting: Neurology

## 2021-03-23 ENCOUNTER — Encounter: Payer: Self-pay | Admitting: Neurology

## 2021-03-23 ENCOUNTER — Other Ambulatory Visit: Payer: Self-pay

## 2021-03-23 VITALS — BP 86/54 | HR 68 | Ht 69.5 in | Wt 170.0 lb

## 2021-03-23 DIAGNOSIS — F0280 Dementia in other diseases classified elsewhere without behavioral disturbance: Secondary | ICD-10-CM | POA: Diagnosis not present

## 2021-03-23 DIAGNOSIS — G20 Parkinson's disease: Secondary | ICD-10-CM

## 2021-03-23 DIAGNOSIS — G3183 Dementia with Lewy bodies: Secondary | ICD-10-CM

## 2021-03-23 MED ORDER — ENTACAPONE 200 MG PO TABS: 200.0000 mg | ORAL_TABLET | Freq: Three times a day (TID) | ORAL | 1 refills | Status: DC

## 2021-03-23 NOTE — Patient Instructions (Signed)
T-Shirt design contest   SUBMIT UNIQUE DESIGN TO WIN!  Winning design will be featured on T-shirts to support our local Parkinson's patients.   Contest rules and information in on link below .   Contest ends on August 31,2022    How to Enter Submit your own unique quote or a design that will stand out and support the ideas behind Parkinson's Awareness  This contest is open to everyone. The winning design or quote will be selected by a group of unbiased judges. Judges will vote based on quality, relevance, and aesthetic. Judges will not have access to the names associated with the submission. Submissions accepted until: June 28, 2021 What You Win Your own quote or design will be printed on a t-shirt that will support Parkinson's Awareness!! Feel good knowing your quote/artwork will help spread awareness for Parkinson's contribute and 100% of profits to the local area . Tips for Submissions We will accept submissions in the form of quotes, digital artwork, or hand drawn artwork.  Mission/Theme: The Power of One ! Working together to support and treat to improve quality of life for Parkinson's Patients Rules & Regulations All designs and quotes must be original and not subject to copyright of another person or entity. Distribution and Reproduction Rights for all quotes and artwork will be transferred to [facility] upon submission to the contest via the Contest Submission form on June 28, 2021 Online Resources for Power over Parkinson's Group May 2022  . Local [place] Online Groups  o Power over Parkinson's Group :   - Power Over Parkinson's Patient Education Group will be Wednesday, May 11th at 2pm via Zoom.   - Upcoming Power over Parkinson's Meetings:  2nd Wednesdays of the month at 2 pm:  June 8th, July 13th - Contact Amy Marriott at [email or web address] if interested in participating in this online group o Parkinson's Care Partners Group:     3rd Mondays, Contact Misty Paladino o Atypical Parkinsonian Patient Group:   4th Wednesdays, Contact Misty Paladino o If you are interested in participating in these online groups with Misty, please contact her directly for how to join those meetings.  Her contact information is [email or web address].   . Parkinson Foundation:  www.parkinson.org o PD Health at Home continues:  Mindfulness Mondays, Expert Briefing Tuesdays, Wellness Wednesdays, Take Time Thursdays, Fitness Fridays -Listings for May 2022 are on the website o Upcoming Webinar:  Newly Diagnosed Building a Better Life with Parkinson   Wednesday, May 18th @ 1 pm o Register for expert briefings (webinars) at [email or web address] o  Please check out their website to sign up for emails and see their full online offerings  . Michael [employer] Foundation:  www.michaeljfox.org  o Upcoming Webinar:   What's in your DNA, understanding Parkinson's genetics.  Thursday, May 19th @ 12 noon o Check out additional information on their website to see their full online offerings  . [place] Phinney Foundation:  www.davisphinneyfoundation.org o Upcoming Webinar:  Stay tuned o Care Partner Monthly Meetup.  With Connie Carpenter Phinney.  First Tuesday of each month, 2 pm o Joy Breaks:  First Wednesday of each month, 2-3 pm. There will be art, doodling, making, crafting, listening, laughing, stories, and everything in between. No art experience necessary. No supplies required. Just show up for joy!  Register on their website. o Check out additional information to Live Well Today on their website  . Parkinson and Movement Disorders (PMD) Alliance:  www.pmdalliance.org o [employer] Online:  Online Education Events   o Sign up for emails, which are sent weekly to give you updates on programming and online offerings     . Parkinson's Association of [place]:  www.parkinsonassociation.org o Information on online support groups,  education events, and online exercises including Yoga, Parkinson's exercises and more-LOTS of information on links to PD resources and online events o Virtual Support Group through Parkinson's Association of [place]; next one is scheduled for Wednesday, May 4th, 2022 at 2 pm. (These are typically scheduled for the 1st Wednesday of the month at 2 pm).  Visit website for details.  . Additional links for movement activities: o PWR! Moves Classes at [facility] HAVE RESUMED!  Wednesdays 10 and 11 am.  Contact Amy Marriott, PT [email or web address] or [phone] if interested o Here is a link to the PWR!Moves classes on Zoom from [place] Parkinson's Foundation - Daily Mon-Sat at 10:00. Via Zoom, FREE and open to all.  There is also a link below via Facebook if you use that platform. - https://www.parkinsonsmi.org/mpf-programs/exercise-and-movement-activities - https://www.facebook.com/ParkinsonsMI.org/posts/pwr-moves-exercise-class-parkinson-wellness-recovery-online-with-angee-ludwa-pt-/10156827878021813/ o Parkinson's Wellness Recovery (PWR! Moves)  www.pwr4life.org - Info on the PWR! Virtual Experience:  You will have access to our expertise through self-assessment, guided plans that start with the PD-specific fundamentals, educational content, tips, Q&A with an expert, and a growing library of PD-specific pre-recorded and live exercise classes of varying types and intensity - both physical and cognitive! If that is not enough, we offer 1:1 wellness consultations (in-person or virtual) to personalize your PWR! Virtual Experience.  - Check out the PWR! Move of the month on the Parkinson Wellness Recovery website:  https://www.pwr4life.org/pwr-move-of-the-month-4/ o Parkinson Foundation Fitness Fridays:  - As part of the PD Health @ Home program, this free video series focuses each week on one aspect of fitness designed to support people living with Parkinson's.  These weekly  videos highlight the Parkinson Foundation recent fitness guidelines for people with Parkinson's disease. -  www.parkinson.org/understanding-parkinsons/coronavirus/PD-health-at-home/Fitness-Fridays o Dance for PD website is offering free, live-stream classes throughout the week, as well as links to digital library of classes:  https://danceforparkinsons.org/ o Dance for Parkinson's Class:  [employer] [place].  Free offering for people with Parkinson's and care partners; virtual class.  o For more information, contact [phone] or email Magalli Morana at [email or web address] o Virtual dance and Pilates for Parkinson's classes: Click on the Community Tab> Parkinson's Movement Initiative Tab.  To register for classes and for more information, visit www.americandancefestival.org and click the "community" tab.     o [employer] Parkinson's Cycling Classes  - [employer]: 1pm on Fridays-Live classes at [employer] (Contact Margaret Hazen at [email or web address] or [phone]) - [employer]: Virtual Classes Mondays and Thursdays /Live classes Tuesday, Wednesday and Thursday (contact Marlee at [email or web address]  or [phone])  o [place] [employer] - Three levels of classes are offered Tuesdays and Thursdays:  10:30 am,  12 noon & 1:45 pm at [employer] [facility].  - Active Stretching with Maria, New Class starting in March, on Fridays - To observe a class or for  more information, call [phone] or email [email or web address] . [employer]: o Online Caregiver Education Opportunities:  www.well-springsolutions.org/caregiver-education/caregiver-support-group.  You may also contact Jodi Kolada at [email or web address] or [phone].   o [facility] Family Caregivers! Thursday, May 12th 10:00a-1:30p [place]  [facility], [place], [address], [employer] [employer] . You may contact Jodi Kolada at  [email or web address] or [phone].   o [facility] Navigator:  Just1Navigator program, a free service to help individuals and families   through the journey of determining care for older adults.  The "Navigator" is a social worker, Nicole Reynolds, who will speak with a prospective client and/or loved ones to provide an assessment of the situation and a set of recommendations for a personalized care plan -- all free of charge, and whether [facility] [employer] offers the needed service or not. If the need is not a service we provide, we are well-connected with reputable programs in town that we can refer you to.  www.well-springsolutions.org or to speak with the Navigator, call [phone].     

## 2021-03-31 ENCOUNTER — Other Ambulatory Visit: Payer: Self-pay

## 2021-03-31 ENCOUNTER — Encounter: Payer: Medicare PPO | Admitting: Psychology

## 2021-03-31 ENCOUNTER — Ambulatory Visit (INDEPENDENT_AMBULATORY_CARE_PROVIDER_SITE_OTHER): Payer: Medicare PPO | Admitting: Psychology

## 2021-03-31 DIAGNOSIS — G20 Parkinson's disease: Secondary | ICD-10-CM | POA: Diagnosis not present

## 2021-03-31 DIAGNOSIS — F0280 Dementia in other diseases classified elsewhere without behavioral disturbance: Secondary | ICD-10-CM | POA: Diagnosis not present

## 2021-03-31 DIAGNOSIS — G3183 Dementia with Lewy bodies: Secondary | ICD-10-CM | POA: Diagnosis not present

## 2021-03-31 NOTE — Progress Notes (Signed)
   Neuropsychology Feedback Session Zachary [name]. [facility] [place] Department of Neurology  Reason for Referral:   Zachary [name] a 70 y.o. 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   Feedback:   Zachary [name] completed a comprehensive neuropsychological evaluation on 03/21/2021. Please refer to that encounter for the full report and recommendations. Briefly, results suggested primary deficits surrounding processing speed, executive functioning, verbal fluency, and visual discrimination. Further variability was noted across attention/concentration and both encoding (i.e., learning) and retrieval aspects of memory. Relative to his previous evaluation, there were generally mild performance declines across processing speed, executive functioning, phonemic fluency, and certain aspects of a list-based memory task. Regarding etiology, the most likely culprit for ongoing dysfunction continues to be his history of Parkinson's disease (i.e., Parkinson's disease dementia).  Zachary [name] was accompanied by his wife during the current feedback session. Content of the current session focused on the results of his neuropsychological evaluation. Zachary [name] and his wife were given the opportunity to ask questions and their questions were answered. They were encouraged to reach out should additional questions arise. A copy of his report was provided at the conclusion of the visit.      30 minutes were spent conducting the current feedback session with Zachary [name], billed as one unit [phone].

## 2021-04-09 ENCOUNTER — Other Ambulatory Visit: Payer: Self-pay | Admitting: Neurology

## 2021-04-09 DIAGNOSIS — G20 Parkinson's disease: Secondary | ICD-10-CM

## 2021-04-10 ENCOUNTER — Encounter: Payer: Medicare PPO | Admitting: Psychology

## 2021-04-17 ENCOUNTER — Ambulatory Visit: Payer: Medicare PPO | Admitting: Neurology

## 2021-04-23 ENCOUNTER — Other Ambulatory Visit: Payer: Self-pay | Admitting: Neurology

## 2021-04-23 DIAGNOSIS — G20 Parkinson's disease: Secondary | ICD-10-CM

## 2021-05-07 DIAGNOSIS — G20 Parkinson's disease: Secondary | ICD-10-CM | POA: Diagnosis not present

## 2021-05-08 DIAGNOSIS — G20 Parkinson's disease: Secondary | ICD-10-CM | POA: Diagnosis not present

## 2021-05-09 DIAGNOSIS — G20 Parkinson's disease: Secondary | ICD-10-CM | POA: Diagnosis not present

## 2021-05-10 DIAGNOSIS — G20 Parkinson's disease: Secondary | ICD-10-CM | POA: Diagnosis not present

## 2021-05-11 DIAGNOSIS — G20 Parkinson's disease: Secondary | ICD-10-CM | POA: Diagnosis not present

## 2021-05-12 DIAGNOSIS — G20 Parkinson's disease: Secondary | ICD-10-CM | POA: Diagnosis not present

## 2021-05-31 DIAGNOSIS — N3941 Urge incontinence: Secondary | ICD-10-CM | POA: Diagnosis not present

## 2021-05-31 DIAGNOSIS — R351 Nocturia: Secondary | ICD-10-CM | POA: Diagnosis not present

## 2021-05-31 DIAGNOSIS — R350 Frequency of micturition: Secondary | ICD-10-CM | POA: Diagnosis not present

## 2021-05-31 DIAGNOSIS — N3944 Nocturnal enuresis: Secondary | ICD-10-CM | POA: Diagnosis not present

## 2021-06-06 DIAGNOSIS — Z125 Encounter for screening for malignant neoplasm of prostate: Secondary | ICD-10-CM | POA: Diagnosis not present

## 2021-06-06 DIAGNOSIS — Z0000 Encounter for general adult medical examination without abnormal findings: Secondary | ICD-10-CM | POA: Diagnosis not present

## 2021-06-12 ENCOUNTER — Other Ambulatory Visit: Payer: Self-pay | Admitting: Neurology

## 2021-06-13 DIAGNOSIS — G3184 Mild cognitive impairment, so stated: Secondary | ICD-10-CM | POA: Diagnosis not present

## 2021-06-13 DIAGNOSIS — Z1331 Encounter for screening for depression: Secondary | ICD-10-CM | POA: Diagnosis not present

## 2021-06-13 DIAGNOSIS — I951 Orthostatic hypotension: Secondary | ICD-10-CM | POA: Diagnosis not present

## 2021-06-13 DIAGNOSIS — G20 Parkinson's disease: Secondary | ICD-10-CM | POA: Diagnosis not present

## 2021-06-13 DIAGNOSIS — H9190 Unspecified hearing loss, unspecified ear: Secondary | ICD-10-CM | POA: Diagnosis not present

## 2021-06-13 DIAGNOSIS — D696 Thrombocytopenia, unspecified: Secondary | ICD-10-CM | POA: Diagnosis not present

## 2021-06-13 DIAGNOSIS — R351 Nocturia: Secondary | ICD-10-CM | POA: Diagnosis not present

## 2021-06-13 DIAGNOSIS — Z0000 Encounter for general adult medical examination without abnormal findings: Secondary | ICD-10-CM | POA: Diagnosis not present

## 2021-06-13 DIAGNOSIS — N3944 Nocturnal enuresis: Secondary | ICD-10-CM | POA: Diagnosis not present

## 2021-06-16 ENCOUNTER — Telehealth: Payer: Self-pay | Admitting: Neurology

## 2021-06-16 DIAGNOSIS — L814 Other melanin hyperpigmentation: Secondary | ICD-10-CM | POA: Diagnosis not present

## 2021-06-16 DIAGNOSIS — D1801 Hemangioma of skin and subcutaneous tissue: Secondary | ICD-10-CM | POA: Diagnosis not present

## 2021-06-16 DIAGNOSIS — L570 Actinic keratosis: Secondary | ICD-10-CM | POA: Diagnosis not present

## 2021-06-16 DIAGNOSIS — C44519 Basal cell carcinoma of skin of other part of trunk: Secondary | ICD-10-CM | POA: Diagnosis not present

## 2021-06-16 DIAGNOSIS — L821 Other seborrheic keratosis: Secondary | ICD-10-CM | POA: Diagnosis not present

## 2021-06-16 NOTE — Telephone Encounter (Signed)
Pt's wife called in stating they needed to get some home care while she was out of town 05/07/21-05/12/21 from [facility]. They were told their insurance would pay for that time if we call them and tell them the patient needed it [phone]. She would like a call once this is done so she can send them information.

## 2021-06-16 NOTE — Telephone Encounter (Signed)
Misty can you follow up on this and find out what we need to do?  I haven't seen insurance pay for this in past but they may have different plan.  Sending to chelsea to make sure misty checks messages.

## 2021-06-19 ENCOUNTER — Encounter: Payer: Self-pay | Admitting: Neurology

## 2021-06-22 NOTE — Telephone Encounter (Signed)
Called patients wife to asked her where she would like to me to send patients letter. She stated that she would like to come to the office to pick it up. Letter has been placed up front. Patients wife will come today to pick up. She is aware of our office hours. Patients wife had no further questions or concerns.

## 2021-07-04 ENCOUNTER — Ambulatory Visit: Payer: Medicare PPO | Admitting: Internal Medicine

## 2021-07-04 ENCOUNTER — Other Ambulatory Visit: Payer: Self-pay

## 2021-07-04 VITALS — BP 78/50 | HR 46 | Ht 69.5 in | Wt 168.6 lb

## 2021-07-04 DIAGNOSIS — I951 Orthostatic hypotension: Secondary | ICD-10-CM

## 2021-07-04 MED ORDER — FLUDROCORTISONE ACETATE 0.1 MG PO TABS: 0.2000 mg | ORAL_TABLET | Freq: Two times a day (BID) | ORAL | 3 refills | Status: DC

## 2021-07-04 NOTE — Patient Instructions (Addendum)
Medication Instructions:  Your physician has recommended you make the following change in your medication:    INCREASE your florinef 0.1 mg-  Take 2 tablets (0.2 mg) by mouth twice a day   Labwork: You will come to the [facility] office for lab work in 2 weeks  Testing/Procedures: None ordered.  Follow-Up: Your physician wants you to follow-up in: 6 months with Lewayne Bunting, MD    Any Other Special Instructions Will Be Listed Below (If Applicable).  If you need a refill on your cardiac medications before your next appointment, please call your pharmacy.

## 2021-07-04 NOTE — Progress Notes (Signed)
HPI Zachary [name] returns today for ongoing evaluation of syncope. He has a long h/o Parkinsons and has begun to fall over the last 3 years. Occaisionally the episodes are associated with a loss of consciousness. He has stopped consuming ETOH and caffeine. He denies chest pain or sob. He has become more sedentary. His bp runs low. He has become more sedentary. When I saw him last we discussed the treatment options and he was started on midodrine. He has continued to have some falls but no frank syncope and he gives the impression that he is a little better. He is playing golf still and exercising.  Allergies  Allergen Reactions   Tree Extract      Current Outpatient Medications  Medication Sig Dispense Refill   AMBULATORY NON FORMULARY MEDICATION Transport chair Dx: Parkinsons Disease G20 1 Device 0   carbidopa-levodopa (SINEMET CR) 50-200 MG tablet TAKE ONE TABLET AT BEDTIME. 90 tablet 0   carbidopa-levodopa (SINEMET IR) 25-100 MG tablet 3 tabS AT 6:30AM/2 at 8:30AM/2 at 10:30AM/2 at 12:30PM/2 at 2:30PM/2 at 4:30pm/2 at 6:30pm. take up to extra tablet (in 1/2 tabs) per day AS NEEDED 450 tablet 0   Cholecalciferol (DIALYVITE VITAMIN D 5000 PO) Take 1 tablet by mouth daily.     entacapone (COMTAN) 200 MG tablet Take 1 tablet (200 mg total) by mouth 3 (three) times daily. 270 tablet 1   fludrocortisone (FLORINEF) 0.1 MG tablet Take 2 tablets (0.2 mg total) by mouth 2 (two) times daily. 360 tablet 3   Lactobacillus (PROBIOTIC ACIDOPHILUS PO) Take 1 tablet by mouth daily.     Multiple Vitamins-Minerals (PRESERVISION AREDS 2 PO) Take 1 tablet by mouth daily.     No current facility-administered medications for this visit.     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   Past Surgical History:  Procedure Laterality Date   CATARACT EXTRACTION, BILATERAL     COLONOSCOPY     SHOULDER SURGERY Left    lymph nodes   TONSILLECTOMY     VASECTOMY       Family History  Problem Relation Age of Onset   Alzheimer's disease Mother    Heart disease Father    Tremor Father    Stroke Maternal Grandmother    Diabetes Child      Social History   Socioeconomic History   Marital status: Married    Spouse name: [name]   Number of children: 3   Years of education: 20   Highest education level: Patent examiner History   Occupation: Retired    Associate Professor: [employer]    Comment: professor - Nurse, children's  Tobacco Use   Smoking status: Never   Smokeless tobacco: Never  Building services engineer Use: Never used  Substance and Sexual Activity   Alcohol use: Not Currently    Alcohol/week: 0.0 standard drinks    Comment: very rare   Drug use: No    Comment: hemp oil   Sexual activity: Not on file  Other Topics Concern  Not on file  Social History Narrative   Pt lives at home with family.   Caffeine Use: 1-2 cups daily.   Right handed   Social Determinants of Health   Financial Resource Strain: Not on file  Food Insecurity: Not on file  Transportation Needs: Not on file  Physical Activity: Not on file  Stress: Not on file  Social Connections: Not on file  Intimate Partner Violence: Not on file     BP (!) 78/50   Pulse (!) 46   Ht 5' 9.5" (1.765 m)   Wt 168 lb 9.6 oz (76.5 kg)   SpO2 95%   BMI 24.54 kg/m   Physical Exam:  Well appearing NAD HEENT: Unremarkable Neck:  No JVD, no thyromegally Lymphatics:  No adenopathy Back:  No CVA tenderness Lungs:  Clear with no wheezes HEART:  Regular rate rhythm, no murmurs, no rubs, no clicks Abd:  soft, positive  bowel sounds, no organomegally, no rebound, no guarding Ext:  2 plus pulses, no edema, no cyanosis, no clubbing Skin:  No rashes no nodules Neuro:  CN II through XII intact, motor grossly intact   Assess/Plan:  1. 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 I asked him to increase his salt intake. He has improved some on florinef but is still falling. I asked him to uptitrate his florinef to 0.2 mg bid. 2. Parkinson's - his neuromuscular symptoms appear to be fairly well controlled.   Zachary Gowda Ziara Thelander,MD

## 2021-07-08 ENCOUNTER — Other Ambulatory Visit: Payer: Self-pay | Admitting: Neurology

## 2021-07-08 DIAGNOSIS — G20 Parkinson's disease: Secondary | ICD-10-CM

## 2021-07-10 ENCOUNTER — Other Ambulatory Visit: Payer: Self-pay

## 2021-07-18 ENCOUNTER — Other Ambulatory Visit: Payer: Medicare PPO

## 2021-07-18 ENCOUNTER — Other Ambulatory Visit: Payer: Self-pay

## 2021-07-18 DIAGNOSIS — I951 Orthostatic hypotension: Secondary | ICD-10-CM | POA: Diagnosis not present

## 2021-07-18 LAB — BASIC METABOLIC PANEL
BUN/Creatinine Ratio: 20 (ref 10–24)
BUN: 17 mg/dL (ref 8–27)
CO2: 28 mmol/L (ref 20–29)
Calcium: 9 mg/dL (ref 8.6–10.2)
Chloride: 102 mmol/L (ref 96–106)
Creatinine, Ser: 0.83 mg/dL (ref 0.76–1.27)
Glucose: 81 mg/dL (ref 65–99)
Potassium: 3.8 mmol/L (ref 3.5–5.2)
Sodium: 142 mmol/L (ref 134–144)
eGFR: 93 mL/min/{1.73_m2} (ref >59)

## 2021-08-06 ENCOUNTER — Other Ambulatory Visit: Payer: Self-pay | Admitting: Neurology

## 2021-08-06 DIAGNOSIS — G20 Parkinson's disease: Secondary | ICD-10-CM

## 2021-08-07 ENCOUNTER — Other Ambulatory Visit: Payer: Self-pay

## 2021-08-07 DIAGNOSIS — G20 Parkinson's disease: Secondary | ICD-10-CM

## 2021-08-15 NOTE — Progress Notes (Signed)
Assessment/Plan:   1.  Parkinsons Disease  -take Carbidopa/levodopa 25/100, 3 tablets at 6:30 AM/2 at 8:30 AM/2 at 10:30 AM/2 at 12:30 PM/2 at 2:30 PM/2 at 4:30pm/2 at 6:30pm.  Can take up to extra tablet (in 1/2 tabs) per day prn  -okay to take entacapone 200 mg with 8:30am/12:30pm/4:30pm dose but we will need to watch cognition with time  -Continue carbidopa/levodopa 50/200 at bed.  -will make appt with LCSW.  Patient wants to go to assisted living and wife doesn't and prefers to hire home care.  Neither of them really is wrong on their opinion.  Wife feels that they have enough money to take care of him in the home, even with full-time caregivers, for years.  Patient feels that the places that he would like to go ([facility], [place]) have waiting lists and he would like to go ahead and get on them and get moved in.  Wife does not want to move.  Discussed with them that this is really not something that I can solve for them, since he is going to have full-time care either way.  He is going to discuss further with my Child psychotherapist.  2.  RBD             -Safety discussed             -Was on 0.25 mg of clonazepam in the past.  They discontinued as pharmacist told them it was addictive.  they seem to give it now 1 day a week and think it helps.     3.    Memory change             -Patient had neurocognitive testing with Dr. Milbert Coulter on May 24 and demonstrated PDD.  Patient actually did fairly well on the testing, but because of some troubles with ADLs and high level of education, they actually classified it with mild PDD   4.  Low BP             -Following with Dr. Ladona Ridgel.  Now on Florinef, 0.1 mg, 2 tablets twice daily.  Subjective:   Zachary [name] was seen today in follow up for Parkinsons disease.  My previous records were reviewed prior to todays visit as well as outside records available to me.  Patient seen with wife who supplements history.  He really wanted to restart entacapone last  visit.  Was nervous about that because of cognitive change, but they felt benefits of the medication outweighed the risks for now.  Not having any hallucinations currently.  Have seen cardiology regarding his history of orthostatic hypotension/syncope.  Dr. Ladona Ridgel increased the patient's Florinef so that he is now taking 0.1 mg, 2 tablets twice per day.  He was seen on September 6.  Notes are reviewed.  Has had some falls.  Pt looking at [facility].  Wife does not want to go so that they are having an issue.  Going to boxing 2 days/week.  Going to stretch yoga Saturday.  Did golf Saturday.    Current prescribed movement disorder medications: Carbidopa/levodopa 25/100, 3 tablets at 6:30 AM/2 at 8:30 AM/2 at 10:30 AM/2 at 12:30 PM/2 at 2:30 PM/2 at 4:30pm/2 at 6:30pm.   Carbidopa/levodopa 50/200 at bedtime Entacapone, 200 mg, 1 with 8:30 AM/12:30 PM/4:30 PM dose (restarted last visit)  PREVIOUS MEDICATIONS: amantadine; Azilect; Inbrija; carbidopa/levodopa 25/100; carbidopa/levodopa 50/200; Stalevo; clonazepam (was only on 0.25 mg at bedtime and they discontinued as pharmacist told them it was  addictive)  ALLERGIES:   Allergies  Allergen Reactions   Tree Extract     CURRENT MEDICATIONS:  Outpatient Encounter Medications as of 08/16/2021  Medication Sig   AMBULATORY NON FORMULARY MEDICATION Transport chair Dx: Parkinsons Disease G20   carbidopa-levodopa (SINEMET CR) 50-200 MG tablet TAKE ONE TABLET AT BEDTIME.   carbidopa-levodopa (SINEMET IR) 25-100 MG tablet 3 tabS AT 6:30AM/2 at 8:30AM/2 at 10:30AM/2 at 12:30PM/2 at 2:30PM/2 at 4:30pm/2 at 6:30pm. take up to extra tablet (in 1/2 tabs) per day AS NEEDED   Cholecalciferol (DIALYVITE VITAMIN D 5000 PO) Take 1 tablet by mouth daily.   entacapone (COMTAN) 200 MG tablet Take 1 tablet (200 mg total) by mouth 3 (three) times daily.   fludrocortisone (FLORINEF) 0.1 MG tablet Take 2 tablets (0.2 mg total) by mouth 2 (two) times daily.    Lactobacillus (PROBIOTIC ACIDOPHILUS PO) Take 1 tablet by mouth daily.   Multiple Vitamins-Minerals (PRESERVISION AREDS 2 PO) Take 1 tablet by mouth daily.   No facility-administered encounter medications on file as of 08/16/2021.    Objective:   PHYSICAL EXAMINATION:    VITALS:   Vitals:   08/16/21 1112  BP: 125/71  Pulse: 86  SpO2: 99%  Weight: 172 lb 12.8 oz (78.4 kg)     GEN:  The patient appears stated age and is in NAD. HEENT:  Normocephalic, atraumatic.  The mucous membranes are moist. The superficial temporal arteries are without ropiness or tenderness. CV:  RRR Lungs:  CTAB Neck/HEME:  There are no carotid bruits bilaterally.  Neurological examination:  Orientation: The patient is alert and oriented x3. Cranial nerves: There is good facial symmety with facial hypomimia. The speech is fluent and clear. Soft palate rises symmetrically and there is no tongue deviation. Hearing is intact to conversational tone. Sensation: Sensation is intact to light touch throughout Motor: Strength is at least antigravity x4.  Movement examination: Tone: There is normal tone today in the bilateral upper extremities. Abnormal movements: none Coordination:  There is mild decremation with RAM's, with hand opening and closing and toe taps on the L. Gait and Station: The patient has no difficulty arising out of a deep-seated chair without the use of the hands. The patient's stride length is very good (showing off) with very good arm swing (same as previous).  He has a little start hesitation and turns just a little en bloc.    I have reviewed and interpreted the following labs independently    Chemistry      Component Value Date/Time   NA 142 07/18/2021 1044   K 3.8 07/18/2021 1044   CL 102 07/18/2021 1044   CO2 28 07/18/2021 1044   BUN 17 07/18/2021 1044   CREATININE 0.83 07/18/2021 1044      Component Value Date/Time   CALCIUM 9.0 07/18/2021 1044       Lab Results   Component Value Date   WBC 5.0 09/15/2012   HGB 14.6 09/15/2012   HCT 42.1 09/15/2012   MCV 94.6 09/15/2012   PLT 217 09/15/2012    No results found for: TSH    Cc:  [name], MD

## 2021-08-16 ENCOUNTER — Encounter: Payer: Self-pay | Admitting: Neurology

## 2021-08-16 ENCOUNTER — Ambulatory Visit: Payer: Medicare PPO | Admitting: Neurology

## 2021-08-16 ENCOUNTER — Other Ambulatory Visit: Payer: Self-pay

## 2021-08-16 VITALS — BP 125/71 | HR 86 | Wt 172.8 lb

## 2021-08-16 DIAGNOSIS — F0280 Dementia in other diseases classified elsewhere without behavioral disturbance: Secondary | ICD-10-CM

## 2021-08-16 DIAGNOSIS — G20 Parkinson's disease: Secondary | ICD-10-CM

## 2021-08-22 ENCOUNTER — Institutional Professional Consult (permissible substitution): Payer: Medicare PPO | Admitting: Licensed Clinical Social Worker

## 2021-08-28 ENCOUNTER — Institutional Professional Consult (permissible substitution): Payer: Medicare PPO | Admitting: Licensed Clinical Social Worker

## 2021-09-05 ENCOUNTER — Other Ambulatory Visit: Payer: Self-pay | Admitting: Neurology

## 2021-09-05 DIAGNOSIS — G20 Parkinson's disease: Secondary | ICD-10-CM

## 2021-09-14 DIAGNOSIS — R351 Nocturia: Secondary | ICD-10-CM | POA: Diagnosis not present

## 2021-10-05 ENCOUNTER — Other Ambulatory Visit: Payer: Self-pay | Admitting: Neurology

## 2021-10-05 DIAGNOSIS — G20 Parkinson's disease: Secondary | ICD-10-CM

## 2021-10-18 ENCOUNTER — Telehealth: Payer: Self-pay | Admitting: Neurology

## 2021-10-18 NOTE — Telephone Encounter (Signed)
Called patients wife back for more information  Patient has been having these symptoms for awhile for a couple of weeks but it has gotten worse. I went over stroke symptoms  on two different times during the call with wife and also instructed her to go to ED patients wife said she didn't want to sit at the ED if she didn't have to. I also asked her to call PCP to see about getting a urine culture and rule out a urinary infection as well. I feel the main thing that bothered her was that he wet all over and didn't know why. He had gone out shopping with some friends a couple of days ago with no issues.

## 2021-10-18 NOTE — Telephone Encounter (Signed)
Pt wife called and states that patient will have a blank look on his face and then like pass out, he is falling and getting really confuse he wet all over the floor this morning and when asked why he took his depends off he said he did not know why wife is concern about if this could be mini strokes or something else    Please call

## 2021-10-19 DIAGNOSIS — I951 Orthostatic hypotension: Secondary | ICD-10-CM | POA: Diagnosis not present

## 2021-10-19 DIAGNOSIS — G20 Parkinson's disease: Secondary | ICD-10-CM | POA: Diagnosis not present

## 2021-10-19 NOTE — Telephone Encounter (Signed)
Called patients wife and gave Dr. Iona Beard advise to follow up with Dr. Ladona Ridgel about passing out episodes. Wife understood and grateful for advise on moving forward

## 2021-10-27 ENCOUNTER — Telehealth: Payer: Self-pay | Admitting: Neurology

## 2021-10-27 NOTE — Telephone Encounter (Signed)
Called patients wife back and left voicemail message  

## 2021-10-27 NOTE — Telephone Encounter (Signed)
Called patients wife and she did not follow up with Dr. Ladona Ridgel about the Florinef she stated they have been monitoring the patients blood pressure and it has been great average of 120/70 or 120/69 a marked increase than before. Patients wife also reading an article stating excessive sugar in parkinson's patients can cause manic behavior. Urinary test was negative for infection and patient taking carbidopa levodopa 3 @ 6:30 2 @ 8:30 2@ 10:30 2 @ 12:30 2@2 :302@ 4:30 and 2 @ 6:30 with his night time dose around 9-10

## 2021-10-27 NOTE — Telephone Encounter (Signed)
Pt's wife called in stating the patient has started having "falling spells" again. She said he went all through the holidays without any problems. He fell and hit the side of his face. She said she had a UTI test done last week and it was negative. His blood pressure is good. She is not sure what is going on.

## 2021-10-31 ENCOUNTER — Other Ambulatory Visit: Payer: Self-pay

## 2021-10-31 ENCOUNTER — Ambulatory Visit
Admission: RE | Admit: 2021-10-31 | Discharge: 2021-10-31 | Disposition: A | Discharge status: Home / Self Care | Payer: Medicare PPO | Source: Ambulatory Visit | Attending: Neurology | Admitting: Neurology

## 2021-10-31 DIAGNOSIS — R4182 Altered mental status, unspecified: Secondary | ICD-10-CM

## 2021-10-31 DIAGNOSIS — R296 Repeated falls: Secondary | ICD-10-CM

## 2021-10-31 DIAGNOSIS — H538 Other visual disturbances: Secondary | ICD-10-CM | POA: Diagnosis not present

## 2021-10-31 NOTE — Telephone Encounter (Signed)
Pt's wife called in stating the pt fell again over the holiday weekend and has a black eye. She is also returning Chelsea's call

## 2021-10-31 NOTE — Telephone Encounter (Signed)
Called patients wife back this morning. Patient has fallen again he has a black eye. Patient also fell out of the bed this morning and while on the phone with patients wife he almost fell again but she was able to catch him. She said patient doesn't seem aware of falling and is not blacking out. Patient doesn't seem to be able to stand at all on his own without falling.I wanted to confirm still doing a cat scan on patient or if there was something else you wanted to order for him

## 2021-10-31 NOTE — Telephone Encounter (Signed)
Ct ordered patient is falling with his walker. Patients wife said PCP  is closed today and was hoping to get an xray of patients ribs from his last fall she is afraid he broke his ribs

## 2021-11-03 NOTE — Telephone Encounter (Signed)
Pt is sch for 11-22-21 with [name]

## 2021-11-03 NOTE — Telephone Encounter (Signed)
Telephone call to patient wife Zachary [name], Reviewed CT results, Per Dr.[name],We will talk about CT details in person.  It didn't show anything new from falls.  [name] that we saw this message from her and we let DR.[name] know once she comes back from her meeting she will advise the staff what the next steps are. And we give her a call back.   Wife verbalized understanding.

## 2021-11-03 NOTE — Telephone Encounter (Signed)
Patient's wife [name] called with concerns about the patient's MRI results seen in MyChart.  She is aware the doctor will review the results and she'll get a call back.

## 2021-11-20 NOTE — Progress Notes (Signed)
Assessment/Plan:   1.  Parkinsons Disease  -take Carbidopa/levodopa 25/100, 3 tablets at 6:30 AM/2 at 8:30 AM/2 at 10:30 AM/2 at 12:30 PM/2 at 2:30 PM/2 at 4:30pm/2 at 6:30pm.  Can take up to extra tablet (in 1/2 tabs) per day prn  -okay to take entacapone 200 mg with 8:30am/12:30pm/4:30pm dose but we will need to watch cognition with time  -Continue carbidopa/levodopa 50/200 at bed.  -Patient had CT brain done that suggested ventricles slightly out of proportion to atrophy.  I do not think that this explains everything, but we did discuss doing a high-volume lumbar puncture.  Discussed fully the risks and benefits of doing this.  Discussed definitive treatment if it were positive (I do not think it will be).  Ultimately, the patient decided he would like to proceed with a high-volume lumbar puncture and wife agreed.  We will send a referral for that.  2.  RBD             -Safety discussed             -Was on 0.25 mg of clonazepam in the past.  They discontinued as pharmacist told them it was addictive.     3.    Memory change             -Patient had neurocognitive testing with Dr. Milbert Coulter on May 24 and demonstrated PDD.  Patient actually did fairly well on the testing, but because of some troubles with ADLs and high level of education, they actually classified it with mild PDD  -They are discussing changing living situation, with having patient going to [facility] while his wife lives independently.   4.  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 Ladona Ridgel.  Now on Florinef, 0.1 mg, 2 tablets twice daily.  -Patient had near syncopal episode in the office today.  He was walking in the office with the examiner and had near syncopal episode.  Wife noted that patient had a certain look on his face, examiner was able to catch him and we were able to get him seated quickly.  Blood pressure was checked within 2 minutes (although on not immediately) and  systolic blood pressure was only in the 80s.  This was characteristic of the patient's multiple "falls" at home.  We decided to add midodrine, 5 mg 3 times per day with meals.  Discussed risk of supine hypertension.  Discussed not lying flat at night.  Wife is to check blood pressure in various positions at home and keep [place] updated.  -Was initially going to do an EEG, but once he had a spell in the office, it was pretty clear that these were related to orthostasis.  We decided to hold off on that.  Subjective:   Zachary [name] was seen today in follow up for Parkinsons disease.  My previous records were reviewed prior to todays visit as well as outside records available to me.  Patient seen with wife who supplements history.  Wife called at the end of December stating that the patient was having what sounded like syncope/near syncope.  She stated that he had an episode that day in which he had a blank look on his face and felt like he was going to pass out.  He got very confused and actually took his depends off and went all over the floor.  We asked him to call Dr. Lubertha Basque office  to make sure that the blood pressure was not dropping, and also to call primary care to make sure he did not have something like urinary tract infection.  He did well for the next week and a half and then the wife called [place] again after Christmas stating that the patient was having fainting spells again.  She stated that he had had his urine checked before Christmas and that he did not have a urinary tract infection.  Had not talked with Dr. Ladona Ridgel, but it had been noting fairly good blood pressures.  The patient even had one episode where he fell and sustained a black eye.  CT brain was done and was nonacute.  However, ventricles were somewhat large compared to the degree of atrophy.  Wife states that with the falls, patient will have a rigid spell, everything is stiff and he is glazed over.  "Its almost like a seizure."  Wife  states that about 2 weeks ago, he did have a syncopal episode.  He was very agitated already as he was not able to do something that he wanted to.  His son is a IT sales professional and was there and pt just passed out.  No loss of bladder/bowel with that.    Current prescribed movement disorder medications: Carbidopa/levodopa 25/100, 3 tablets at 6:30 AM/2 at 8:30 AM/2 at 10:30 AM/2 at 12:30 PM/2 at 2:30 PM/2 at 4:30pm/2 at 6:30pm.   Carbidopa/levodopa 50/200 at bedtime Entacapone, 200 mg, 1 with 8:30 AM/12:30 PM/4:30 PM dose   PREVIOUS MEDICATIONS: amantadine; Azilect; Inbrija; carbidopa/levodopa 25/100; carbidopa/levodopa 50/200; Stalevo; clonazepam (was only on 0.25 mg at bedtime and they discontinued as pharmacist told them it was addictive)  ALLERGIES:   Allergies  Allergen Reactions   Tree Extract     CURRENT MEDICATIONS:  Outpatient Encounter Medications as of 11/22/2021  Medication Sig   AMBULATORY NON FORMULARY MEDICATION Transport chair Dx: Parkinsons Disease G20   carbidopa-levodopa (SINEMET CR) 50-200 MG tablet TAKE ONE TABLET AT BEDTIME   carbidopa-levodopa (SINEMET IR) 25-100 MG tablet 3 tabs AT 6:30AM/2 at 8:30AM/2 at 10:30AM/2 at 12:30PM/2 at 2:30PM/2 at 4:30pm/2 at 6:30pm. take up to extra tablet (in 1/2 tabs) per day AS NEEDED   Cholecalciferol (DIALYVITE VITAMIN D 5000 PO) Take 1 tablet by mouth daily.   entacapone (COMTAN) 200 MG tablet Take 1 tablet (200 mg total) by mouth 3 (three) times daily (8:30/12:30/4:30).   fludrocortisone (FLORINEF) 0.1 MG tablet Take 2 tablets (0.2 mg total) by mouth 2 (two) times daily.   Lactobacillus (PROBIOTIC ACIDOPHILUS PO) Take 1 tablet by mouth daily.   Multiple Vitamins-Minerals (PRESERVISION AREDS 2 PO) Take 1 tablet by mouth daily.   No facility-administered encounter medications on file as of 11/22/2021.    Objective:   PHYSICAL EXAMINATION:    VITALS:   Vitals:   11/22/21 1001  BP: 115/82  Pulse: 67  SpO2: 99%      GEN:   The patient appears stated age and is in NAD. HEENT:  Normocephalic, atraumatic.  The mucous membranes are moist. The superficial temporal arteries are without ropiness or tenderness. CV:  RRR Lungs:  CTAB Neck/HEME:  There are no carotid bruits bilaterally.  Neurological examination:  Orientation: The patient is alert and oriented x3.  He looks to wife for finer aspects of the history.  There is intermittent confusion. Cranial nerves: There is good facial symmety with facial hypomimia. The speech is fluent and clear. Soft palate rises symmetrically and there is no tongue deviation. Hearing  is intact to conversational tone. Sensation: Sensation is intact to light touch throughout Motor: Strength is at least antigravity x4.  Movement examination: Tone: There is normal tone today in the bilateral upper extremities. Abnormal movements: none Coordination:  There is mild decremation with RAM's, with hand opening and closing and toe taps on the L. Gait and Station: The patient pushes off of his chair today and walks fairly well in the hall.  On the way back, his wife noticed that something just did not look right to her and she asked him if he was going to have a spell.  He told her no, but she stated that she felt sure he was going to.  She asked him again and he stated that he was not going to, but he quickly stopped walking and then stated that he was not going to be able to stand up for much longer.  I was then able to hold the patient with 2 hands around the front of him while his wife got his walker/transport chair.  The patient was just a little bit confused.  Blood pressure was checked a few minutes into the event and systolic was only in the 80s.  I have reviewed and interpreted the following labs independently    Chemistry      Component Value Date/Time   NA 142 07/18/2021 1044   K 3.8 07/18/2021 1044   CL 102 07/18/2021 1044   CO2 28 07/18/2021 1044   BUN 17 07/18/2021 1044    CREATININE 0.83 07/18/2021 1044      Component Value Date/Time   CALCIUM 9.0 07/18/2021 1044       Lab Results  Component Value Date   WBC 5.0 09/15/2012   HGB 14.6 09/15/2012   HCT 42.1 09/15/2012   MCV 94.6 09/15/2012   PLT 217 09/15/2012    No results found for: TSH    Cc:  Zachary [name], Zachary D Jr., MD  Follow up is anticipated in the next 4-6 months, sooner should new neurologic issues arise.  Time in room and coordinating care was 44 minutes.

## 2021-11-22 ENCOUNTER — Telehealth: Payer: Self-pay

## 2021-11-22 ENCOUNTER — Other Ambulatory Visit: Payer: Self-pay

## 2021-11-22 ENCOUNTER — Ambulatory Visit: Payer: Medicare PPO | Admitting: Neurology

## 2021-11-22 ENCOUNTER — Encounter: Payer: Self-pay | Admitting: Neurology

## 2021-11-22 VITALS — BP 80/42 | HR 67

## 2021-11-22 DIAGNOSIS — R55 Syncope and collapse: Secondary | ICD-10-CM

## 2021-11-22 DIAGNOSIS — G20 Parkinson's disease: Secondary | ICD-10-CM

## 2021-11-22 DIAGNOSIS — Z23 Encounter for immunization: Secondary | ICD-10-CM | POA: Diagnosis not present

## 2021-11-22 DIAGNOSIS — R296 Repeated falls: Secondary | ICD-10-CM | POA: Diagnosis not present

## 2021-11-22 DIAGNOSIS — R0781 Pleurodynia: Secondary | ICD-10-CM | POA: Diagnosis not present

## 2021-11-22 DIAGNOSIS — R413 Other amnesia: Secondary | ICD-10-CM

## 2021-11-22 DIAGNOSIS — G903 Multi-system degeneration of the autonomic nervous system: Secondary | ICD-10-CM | POA: Diagnosis not present

## 2021-11-22 DIAGNOSIS — G4752 REM sleep behavior disorder: Secondary | ICD-10-CM | POA: Diagnosis not present

## 2021-11-22 DIAGNOSIS — G20A1 Parkinson's disease without dyskinesia, without mention of fluctuations: Secondary | ICD-10-CM

## 2021-11-22 DIAGNOSIS — G9389 Other specified disorders of brain: Secondary | ICD-10-CM

## 2021-11-22 DIAGNOSIS — F0280 Dementia in other diseases classified elsewhere without behavioral disturbance: Secondary | ICD-10-CM | POA: Diagnosis not present

## 2021-11-22 MED ORDER — MIDODRINE HCL 5 MG PO TABS: 5.0000 mg | ORAL_TABLET | Freq: Three times a day (TID) | ORAL | 1 refills | Status: DC

## 2021-11-22 NOTE — Telephone Encounter (Signed)
Patient is scheduled through central scheduling for Feb 1 @ 7:30 AM called to let patients wife know of the time and she said that it was to early. The hospital only does these appointments at this time in the morning. Patient will call back if they decide they do not want to get up that early

## 2021-11-22 NOTE — Patient Instructions (Signed)
Start midodrine 5 mg three times per day with meals.

## 2021-11-22 NOTE — Telephone Encounter (Signed)
Put order in for Lumbar puncture. Called PT and spoke to [place] to let her know that we ordered this testing and need PT involved. Sent message to c/entral Scheduling Dolores Lory

## 2021-11-29 ENCOUNTER — Ambulatory Visit (HOSPITAL_COMMUNITY): Admission: RE | Admit: 2021-11-29 | Payer: Medicare PPO | Source: Ambulatory Visit

## 2021-11-29 ENCOUNTER — Ambulatory Visit (HOSPITAL_COMMUNITY): Payer: Medicare PPO

## 2021-12-04 ENCOUNTER — Other Ambulatory Visit: Payer: Self-pay | Admitting: Neurology

## 2021-12-04 DIAGNOSIS — G20 Parkinson's disease: Secondary | ICD-10-CM

## 2021-12-04 NOTE — Telephone Encounter (Signed)
Patient has decided to not follow through with the lumbar puncture

## 2021-12-13 DIAGNOSIS — Z111 Encounter for screening for respiratory tuberculosis: Secondary | ICD-10-CM | POA: Diagnosis not present

## 2022-01-01 ENCOUNTER — Encounter: Payer: Self-pay | Admitting: Internal Medicine

## 2022-01-01 ENCOUNTER — Ambulatory Visit: Payer: Medicare PPO | Admitting: Internal Medicine

## 2022-01-01 ENCOUNTER — Other Ambulatory Visit: Payer: Self-pay

## 2022-01-01 VITALS — BP 90/60 | HR 64 | Ht 69.5 in | Wt 172.0 lb

## 2022-01-01 DIAGNOSIS — R55 Syncope and collapse: Secondary | ICD-10-CM | POA: Diagnosis not present

## 2022-01-01 DIAGNOSIS — G20 Parkinson's disease: Secondary | ICD-10-CM | POA: Diagnosis not present

## 2022-01-01 NOTE — Patient Instructions (Addendum)
Medication Instructions:  ?Your physician recommends that you continue on your current medications as directed. Please refer to the Current Medication list given to you today. ? ?You should be taking florinef 0.1 mg- TAKE 2 tablets (0.2 mg) by mouth TWICE a day ? ?Labwork: ?None ordered. ? ?Testing/Procedures: ?None ordered. ? ?Follow-Up: ?Your physician wants you to follow-up in: one year with Lewayne Bunting, MD. ?You will receive a reminder letter in the mail two months in advance. If you don't receive a letter, please call our office to schedule the follow-up appointment. ? ? ?Any Other Special Instructions Will Be Listed Below (If Applicable). ? ?If you need a refill on your cardiac medications before your next appointment, please call your pharmacy.  ? ? ? ? ?

## 2022-01-01 NOTE — Progress Notes (Signed)
? ? ? ? ?HPI ?[name] returns today for ongoing evaluation of syncope. He has a long h/o Parkinsons and has begun to fall over the last 3-4 years. Occaisionally the episodes are associated with a loss of consciousness. He has stopped consuming ETOH and caffeine. He denies chest pain or sob. He has become more sedentary. His bp runs low. When I saw him last we discussed the treatment options and he was started on midodrine. He has continued to have some falls but no frank syncope and he gives the impression that he is a little better. He is on florinef but his dose was changed to 0.1 bid from 0.2 for unclear reasons. His bp is almost always low. ?Allergies  ?Allergen Reactions  ? Tree Extract   ? ? ? ?Current Outpatient Medications  ?Medication Sig Dispense Refill  ? AMBULATORY NON FORMULARY MEDICATION Transport chair ?Dx: Parkinsons Disease G20 1 Device 0  ? carbidopa-levodopa (SINEMET CR) 50-200 MG tablet TAKE ONE TABLET AT BEDTIME 90 tablet 0  ? carbidopa-levodopa (SINEMET IR) 25-100 MG tablet 3 tabs AT 6:30AM/2 at 8:30AM/2 at 10:30AM/2 at 12:30PM/2 at 2:30PM/2 at 4:30pm/2 at 6:30pm. take up to extra tablet (in 1/2 tabs) per day AS NEEDED 1375 tablet 1  ? Cholecalciferol (DIALYVITE VITAMIN D 5000 PO) Take 1 tablet by mouth daily.    ? entacapone (COMTAN) 200 MG tablet Take 1 tablet (200 mg total) by mouth 3 (three) times daily (8:30/12:30/4:30). 270 tablet 0  ? fludrocortisone (FLORINEF) 0.1 MG tablet Take 2 tablets (0.2 mg total) by mouth 2 (two) times daily. 360 tablet 3  ? Lactobacillus (PROBIOTIC ACIDOPHILUS PO) Take 1 tablet by mouth daily.    ? midodrine (PROAMATINE) 5 MG tablet Take 1 tablet (5 mg total) by mouth 3 (three) times daily with meals. 90 tablet 1  ? Multiple Vitamins-Minerals (PRESERVISION AREDS 2 PO) Take 1 tablet by mouth daily.    ? finasteride (PROSCAR) 5 MG tablet Take 1 tablet by mouth daily.    ? tamsulosin (FLOMAX) 0.4 MG CAPS capsule Take 1 capsule by mouth daily.    ? ?No current  facility-administered medications for this visit.  ? ? ? ?Past Medical History:  ?Diagnosis Date  ? Allergy   ? Asthma   ? Chest pain 09/22/2012  ? Frequent falls   ? Hearing loss 04/29/2019  ? utilizes bilateral hearing aids  ? 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 Nocturia 04/15/2018  ? Orthostatic hypotension 03/02/2021  ? Parkinson's disease 06/1999  ? congenital R fourth nerve palsy  ? ? ?ROS: ? ? All systems reviewed and negative except as noted in the HPI. ? ? ?Past Surgical History:  ?Procedure Laterality Date  ? CATARACT EXTRACTION, BILATERAL    ? COLONOSCOPY    ? SHOULDER SURGERY Left   ? lymph nodes  ? TONSILLECTOMY    ? VASECTOMY    ? ? ? ?Family History  ?Problem Relation Age of Onset  ? Alzheimer's disease Mother   ? Heart disease Father   ? Tremor Father   ? Stroke Maternal Grandmother   ? Diabetes Child   ? ? ? ?Social History  ? ?Socioeconomic History  ? Marital status: Married  ?  Spouse name: [name]  ? Number of children: 3  ? Years of education: 64  ? Highest education level: Doctorate  ?Occupational History  ? Occupation: Retired  ?  Employer: [employer]  ?  Comment: professor - economics  ?Tobacco Use  ?  Smoking status: Never  ? Smokeless tobacco: Never  ?Vaping Use  ? Vaping Use: Never used  ?Substance and Sexual Activity  ? Alcohol use: Not Currently  ?  Alcohol/week: 0.0 standard drinks  ?  Comment: very rare  ? Drug use: No  ?  Comment: hemp oil  ? Sexual activity: Not on file  ?Other Topics Concern  ? Not on file  ?Social History Narrative  ? Pt lives at home with family.  ? Caffeine Use: 1-2 cups daily.  ? Right handed  ? ?Social Determinants of Health  ? ?Financial Resource Strain: Not on file  ?Food Insecurity: Not on file  ?Transportation Needs: Not on file  ?Physical Activity: Not on file  ?Stress: Not on file  ?Social Connections: Not on file  ?Intimate Partner Violence: Not on file  ? ? ? ?BP 90/60   Pulse 64   Ht 5' 9.5" (1.765 m)   Wt 172 lb  (78 kg)   SpO2 93%   BMI 25.04 kg/m?  ? ?Physical Exam: ? ?Well appearing NAD ?HEENT: Unremarkable ?Neck:  No JVD, no thyromegally ?Lymphatics:  No adenopathy ?Back:  No CVA tenderness ?Lungs:  Clear with no wheezes ?HEART:  Regular rate rhythm, no murmurs, no rubs, no clicks ?Abd:  soft, positive bowel sounds, no organomegally, no rebound, no guarding ?Ext:  2 plus pulses, no edema, no cyanosis, no clubbing ?Skin:  No rashes no nodules ?Neuro:  CN II through XII intact, motor grossly intact ? ? ?Assess/Plan:  ?1. 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 I asked him to increase his salt intake. He has improved some on florinef but is still falling. I asked him to uptitrate his florinef to 0.2 mg bid. We discussed Northera which I would recommend if he remains symptomatic on the combination of both proamatine and florinef. ?2. Parkinson's - his neuromuscular symptoms appear to be fairly well controlled. ?  ?Salome Spotted ? ?

## 2022-01-02 ENCOUNTER — Ambulatory Visit: Payer: Medicare PPO | Admitting: Internal Medicine

## 2022-01-03 ENCOUNTER — Other Ambulatory Visit: Payer: Self-pay | Admitting: Neurology

## 2022-01-03 DIAGNOSIS — G20 Parkinson's disease: Secondary | ICD-10-CM

## 2022-01-03 DIAGNOSIS — G20A1 Parkinson's disease without dyskinesia, without mention of fluctuations: Secondary | ICD-10-CM

## 2022-01-04 ENCOUNTER — Other Ambulatory Visit: Payer: Self-pay | Admitting: Neurology

## 2022-01-04 DIAGNOSIS — G20 Parkinson's disease: Secondary | ICD-10-CM

## 2022-01-04 DIAGNOSIS — G9389 Other specified disorders of brain: Secondary | ICD-10-CM

## 2022-01-16 NOTE — Progress Notes (Signed)
? ? ?Assessment/Plan:  ? ?1.  Parkinsons Disease ? -take Carbidopa/levodopa 25/100, 3 tablets at 6:30 AM/2 at 8:30 AM/2 at 10:30 AM/2 at 12:30 PM/2 at 2:30 PM/2 at 4:30pm/2 at 6:30pm.  Can take up to extra tablet (in 1/2 tabs) per day prn ? -okay to take entacapone 200 mg with 8:30am/12:30pm/4:30pm dose but we will need to watch cognition with time ? -Continue carbidopa/levodopa 50/200 at bed. ? -Patient had CT brain done that suggested ventricles slightly out of proportion to atrophy.  They have opted to not do lumbar puncture. ? ?2.  RBD ?            -Safety discussed ?            -Was on 0.25 mg of clonazepam in the past.  They discontinued as pharmacist told them it was addictive.   ?  ?3.    Memory change/PDD ?            -Patient had neurocognitive testing with Dr. Milbert [name] on May 24 and demonstrated PDD.  Patient actually did fairly well on the testing, but because of some troubles with ADLs and high level of education, they actually classified it with mild PDD ? -They are discussing changing living situation, with having patient going to [facility] while his wife lives independently. ?  ?4.  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            -Following with Dr. Ladona [name].  Dr. Ladona [name] has prescribed Florinef, 0.1 mg, 2 tablets twice daily but [name] has only been giving him 1 tablet twice per day.  Wrote on his form to [facility] that this needs corrected, and to follow-up with cardiology if they need a new prescription. ? -[name] has only been giving midodrine 5 mg twice per day.  Wrote new order for [facility] to give it 5 mg 3 times per day, as is written on his bottle and all orders were originally for that. ? -Watch blood pressure closely, as it appears that [facility] has not been giving Florinef or midodrine correctly, so we will need to watch carefully as we increase this medication.  However, he has continued to have near syncopal spells, but the syncopal  spells sound like they are better. ? ?Subjective:  ? ?Zachary [name] was seen today in follow up for Parkinsons disease.  My previous records were reviewed prior to todays visit as well as outside records available to me.  Patient seen with wife who supplements history.  Last visit, the patient was having near syncopal episodes and had one in our office.  We added midodrine, 5 mg 3 times per day.  He did see cardiology on March 6.  I had the opportunity to review those notes.  Cardiology noted that someone changed his Florinef from 0.1 mg twice per day to 0.2 mg (was not [place]) but his cardiologist did note blood pressures continue to run low (90/60 in his office) and wanted him to uptitrate his Florinef to 0.2 mg twice per day.  However, the med list from [facility] reports only giving florinef 0.1 mg bid and midodrine twice per day and not tid.   He has had spells of near syncope.  He has had other falls but wife isn't sure if these were near syncopal or not b/c they aren't living together any longer.  She does visit him daily.  At our last visit, we discussed high-volume lumbar puncture because of ventriculomegaly.  They wanted to proceed.  This was ordered and it turns out that radiology only does these early in the morning and patient/wife did not want to do it in the morning and decided to cancel it altogether. ? ? ? ?Current prescribed movement disorder medications: ?Carbidopa/levodopa 25/100, 3 tablets at 6:30 AM/2 at 8:30 AM/2 at 10:30 AM/2 at 12:30 PM/2 at 2:30 PM/2 at 4:30pm/2 at 6:30pm.   ?Carbidopa/levodopa 50/200 at bedtime ?Entacapone, 200 mg, 1 with 8:30 AM/12:30 PM/4:30 PM dose  ?Midodrine, 5 mg 3 times per day (added last visit) ? ?PREVIOUS MEDICATIONS: amantadine; Azilect; Inbrija; carbidopa/levodopa 25/100; carbidopa/levodopa 50/200; Stalevo; clonazepam (was only on 0.25 mg at bedtime and they discontinued as pharmacist told them it was addictive) ? ?ALLERGIES:   ?Allergies  ?Allergen Reactions   ? Tree Extract   ? ? ?CURRENT MEDICATIONS:  ?Outpatient Encounter Medications as of 01/18/2022  ?Medication Sig  ? AMBULATORY NON FORMULARY MEDICATION Transport chair ?Dx: Parkinsons Disease G20  ? carbidopa-levodopa (SINEMET CR) 50-200 MG tablet TAKE ONE TABLET BY MOUTH AT BEDTIME  ? carbidopa-levodopa (SINEMET IR) 25-100 MG tablet 3 tabs AT 6:30AM/2 at 8:30AM/2 at 10:30AM/2 at 12:30PM/2 at 2:30PM/2 at 4:30pm/2 at 6:30pm. take up to extra tablet (in 1/2 tabs) per day AS NEEDED  ? Cholecalciferol (DIALYVITE VITAMIN D 5000 PO) Take 1 tablet by mouth daily.  ? entacapone (COMTAN) 200 MG tablet Take 1 tablet (200 mg total) by mouth 3 (three) times daily (8:30/12:30/4:30).  ? finasteride (PROSCAR) 5 MG tablet Take 1 tablet by mouth daily.  ? fludrocortisone (FLORINEF) 0.1 MG tablet Take 2 tablets (0.2 mg total) by mouth 2 (two) times daily.  ? Lactobacillus (PROBIOTIC ACIDOPHILUS PO) Take 1 tablet by mouth daily.  ? Multiple Vitamins-Minerals (PRESERVISION AREDS 2 PO) Take 1 tablet by mouth daily.  ? tamsulosin (FLOMAX) 0.4 MG CAPS capsule Take 1 capsule by mouth daily.  ? [DISCONTINUED] midodrine (PROAMATINE) 5 MG tablet TAKE ONE TABLET BY MOUTH THREE TIMES A DAILY WITH MEALS  ? midodrine (PROAMATINE) 5 MG tablet TAKE ONE TABLET BY MOUTH THREE TIMES A DAILY WITH MEALS  ? ?No facility-administered encounter medications on file as of 01/18/2022.  ? ? ?Objective:  ? ?PHYSICAL EXAMINATION:   ? ?VITALS:   ?Vitals:  ? 01/18/22 1604  ?BP: 119/75  ?Pulse: 65  ?SpO2: 97%  ?Weight: 174 lb 3.2 oz (79 kg)  ?Height: 5\' 9"  (1.753 m)  ? ? ? ? ? ?GEN:  The patient appears stated age and is in NAD. ?HEENT:  Normocephalic, atraumatic.  The mucous membranes are moist. The superficial temporal arteries are without ropiness or tenderness. ?CV:  RRR ?Lungs:  CTAB ?Neck/HEME:  There are no carotid bruits bilaterally. ? ?Neurological examination: ? ?Orientation: The patient is alert and oriented x3.  He looks to wife for finer aspects of the  history.  There is intermittent confusion. ?Cranial nerves: There is good facial symmety with facial hypomimia. The speech is fluent and clear. Soft palate rises symmetrically and there is no tongue deviation. Hearing is intact to conversational tone. ?Sensation: Sensation is intact to light touch throughout ?Motor: Strength is at least antigravity x4. ? ?Movement examination: ?Tone: There is normal tone today in the bilateral upper extremities. ?Abnormal movements: none ?Coordination:  There is decremation with rapid alternating movements bilaterally.  There is also some apraxia when asked to perform these commands. ?Gait and Station: the patient nearly runs down the hall (festinates) and he nearly falls in the turn ? ?I have reviewed  and interpreted the following labs independently ? ?  Chemistry   ?   ?Component Value Date/Time  ? NA 142 07/18/2021 1044  ? K 3.8 07/18/2021 1044  ? CL 102 07/18/2021 1044  ? CO2 28 07/18/2021 1044  ? BUN 17 07/18/2021 1044  ? CREATININE 0.83 07/18/2021 1044  ?    ?Component Value Date/Time  ? CALCIUM 9.0 07/18/2021 1044  ?  ? ? ? ?Lab Results  ?Component Value Date  ? WBC 5.0 09/15/2012  ? HGB 14.6 09/15/2012  ? HCT 42.1 09/15/2012  ? MCV 94.6 09/15/2012  ? PLT 217 09/15/2012  ? ? ?No results found for: TSH ? ? ? ?Cc:  Cleatis PolkaShaw, William D Jr., MD ? ?Total time spent on today's visit was 31 minutes, including both face-to-face time and nonface-to-face time.  Time included that spent on review of records (prior notes available to me/labs/imaging if pertinent), discussing treatment and goals, answering patient's questions and coordinating care. ? ? ? ? ?

## 2022-01-18 ENCOUNTER — Other Ambulatory Visit: Payer: Self-pay

## 2022-01-18 ENCOUNTER — Ambulatory Visit: Payer: Medicare PPO | Admitting: Neurology

## 2022-01-18 ENCOUNTER — Encounter: Payer: Self-pay | Admitting: Neurology

## 2022-01-18 DIAGNOSIS — G20 Parkinson's disease: Secondary | ICD-10-CM | POA: Diagnosis not present

## 2022-01-18 MED ORDER — MIDODRINE HCL 5 MG PO TABS: ORAL_TABLET | ORAL | 0 refills | Status: DC

## 2022-01-18 NOTE — Patient Instructions (Signed)
Please give meds as directed and at AT TIMES directed.  Exact timing of Parkinsons Disease medications is very important to success of the patient ?Patients midodrine was always prescribed THREE times per day.  Please move dosage to THREE times per day with meals ?Please note florinef dosing on this AVS.  If a new RX is needed, contact patients cardiologist. ? ?The physicians and staff at [facility] are committed to providing excellent care. You may receive a survey requesting feedback about your experience at our office. We strive to receive "very good" responses to the survey questions. If you feel that your experience would prevent you from giving the office a "very good " response, please contact our office to try to remedy the situation. We may be reached at [phone]. Thank you for taking the time out of your busy day to complete the survey. ? ?

## 2022-01-24 DIAGNOSIS — G20 Parkinson's disease: Secondary | ICD-10-CM | POA: Diagnosis not present

## 2022-01-24 DIAGNOSIS — M6281 Muscle weakness (generalized): Secondary | ICD-10-CM | POA: Diagnosis not present

## 2022-01-24 DIAGNOSIS — R2689 Other abnormalities of gait and mobility: Secondary | ICD-10-CM | POA: Diagnosis not present

## 2022-01-24 DIAGNOSIS — R296 Repeated falls: Secondary | ICD-10-CM | POA: Diagnosis not present

## 2022-01-24 DIAGNOSIS — N3946 Mixed incontinence: Secondary | ICD-10-CM | POA: Diagnosis not present

## 2022-01-25 DIAGNOSIS — R41841 Cognitive communication deficit: Secondary | ICD-10-CM | POA: Diagnosis not present

## 2022-01-25 DIAGNOSIS — R488 Other symbolic dysfunctions: Secondary | ICD-10-CM | POA: Diagnosis not present

## 2022-01-25 DIAGNOSIS — R490 Dysphonia: Secondary | ICD-10-CM | POA: Diagnosis not present

## 2022-01-26 DIAGNOSIS — N3946 Mixed incontinence: Secondary | ICD-10-CM | POA: Diagnosis not present

## 2022-01-26 DIAGNOSIS — G20 Parkinson's disease: Secondary | ICD-10-CM | POA: Diagnosis not present

## 2022-01-26 DIAGNOSIS — M6281 Muscle weakness (generalized): Secondary | ICD-10-CM | POA: Diagnosis not present

## 2022-01-26 DIAGNOSIS — R296 Repeated falls: Secondary | ICD-10-CM | POA: Diagnosis not present

## 2022-01-26 DIAGNOSIS — R2689 Other abnormalities of gait and mobility: Secondary | ICD-10-CM | POA: Diagnosis not present

## 2022-01-29 DIAGNOSIS — Z961 Presence of intraocular lens: Secondary | ICD-10-CM | POA: Diagnosis not present

## 2022-01-29 DIAGNOSIS — H04123 Dry eye syndrome of bilateral lacrimal glands: Secondary | ICD-10-CM | POA: Diagnosis not present

## 2022-01-29 DIAGNOSIS — G20 Parkinson's disease: Secondary | ICD-10-CM | POA: Diagnosis not present

## 2022-01-29 DIAGNOSIS — R296 Repeated falls: Secondary | ICD-10-CM | POA: Diagnosis not present

## 2022-01-29 DIAGNOSIS — N3946 Mixed incontinence: Secondary | ICD-10-CM | POA: Diagnosis not present

## 2022-01-29 DIAGNOSIS — M6281 Muscle weakness (generalized): Secondary | ICD-10-CM | POA: Diagnosis not present

## 2022-01-30 ENCOUNTER — Emergency Department (HOSPITAL_BASED_OUTPATIENT_CLINIC_OR_DEPARTMENT_OTHER): Payer: Medicare PPO

## 2022-01-30 ENCOUNTER — Encounter (HOSPITAL_BASED_OUTPATIENT_CLINIC_OR_DEPARTMENT_OTHER): Payer: Self-pay | Admitting: Obstetrics and Gynecology

## 2022-01-30 ENCOUNTER — Emergency Department (HOSPITAL_BASED_OUTPATIENT_CLINIC_OR_DEPARTMENT_OTHER)
Admission: EM | Admit: 2022-01-30 | Discharge: 2022-01-30 | Disposition: A | Discharge status: Home / Self Care | Payer: Medicare PPO | Attending: Emergency Medicine | Admitting: Emergency Medicine

## 2022-01-30 ENCOUNTER — Other Ambulatory Visit: Payer: Self-pay

## 2022-01-30 DIAGNOSIS — R9431 Abnormal electrocardiogram [ECG] [EKG]: Secondary | ICD-10-CM | POA: Diagnosis not present

## 2022-01-30 DIAGNOSIS — Z043 Encounter for examination and observation following other accident: Secondary | ICD-10-CM | POA: Diagnosis not present

## 2022-01-30 DIAGNOSIS — G20 Parkinson's disease: Secondary | ICD-10-CM | POA: Diagnosis not present

## 2022-01-30 DIAGNOSIS — S0101XA Laceration without foreign body of scalp, initial encounter: Secondary | ICD-10-CM | POA: Diagnosis not present

## 2022-01-30 DIAGNOSIS — S0990XA Unspecified injury of head, initial encounter: Secondary | ICD-10-CM | POA: Diagnosis not present

## 2022-01-30 DIAGNOSIS — W19XXXA Unspecified fall, initial encounter: Secondary | ICD-10-CM | POA: Insufficient documentation

## 2022-01-30 DIAGNOSIS — R296 Repeated falls: Secondary | ICD-10-CM | POA: Diagnosis not present

## 2022-01-30 DIAGNOSIS — M50322 Other cervical disc degeneration at C5-C6 level: Secondary | ICD-10-CM | POA: Diagnosis not present

## 2022-01-30 LAB — BASIC METABOLIC PANEL
Anion gap: 10 (ref 5–15)
BUN: 19 mg/dL (ref 8–23)
CO2: 27 mmol/L (ref 22–32)
Calcium: 9.1 mg/dL (ref 8.9–10.3)
Chloride: 103 mmol/L (ref 98–111)
Creatinine, Ser: 0.82 mg/dL (ref 0.61–1.24)
GFR, Estimated: >60 mL/min (ref >60)
Glucose, Bld: 123 mg/dL — ABNORMAL HIGH (ref 70–99)
Potassium: 3.5 mmol/L (ref 3.5–5.1)
Sodium: 140 mmol/L (ref 135–145)

## 2022-01-30 LAB — CBC WITH DIFFERENTIAL/PLATELET
Abs Immature Granulocytes: 0.04 10*3/uL (ref 0.00–0.07)
Basophils Absolute: 0 10*3/uL (ref 0.0–0.1)
Basophils Relative: 1 %
Eosinophils Absolute: 0.1 10*3/uL (ref 0.0–0.5)
Eosinophils Relative: 1 %
HCT: 40.2 % (ref 39.0–52.0)
Hemoglobin: 13.6 g/dL (ref 13.0–17.0)
Immature Granulocytes: 1 %
Lymphocytes Relative: 17 %
Lymphs Abs: 1 10*3/uL (ref 0.7–4.0)
MCH: 32.6 pg (ref 26.0–34.0)
MCHC: 33.8 g/dL (ref 30.0–36.0)
MCV: 96.4 fL (ref 80.0–100.0)
Monocytes Absolute: 0.5 10*3/uL (ref 0.1–1.0)
Monocytes Relative: 9 %
Neutro Abs: 4.4 10*3/uL (ref 1.7–7.7)
Neutrophils Relative %: 71 %
Platelets: 223 10*3/uL (ref 150–400)
RBC: 4.17 MIL/uL — ABNORMAL LOW (ref 4.22–5.81)
RDW: 11.9 % (ref 11.5–15.5)
WBC: 6.1 10*3/uL (ref 4.0–10.5)
nRBC: 0 % (ref 0.0–0.2)

## 2022-01-30 MED ORDER — BACITRACIN ZINC 500 UNIT/GM EX OINT
TOPICAL_OINTMENT | Freq: Once | CUTANEOUS | Status: AC
  Filled 2022-01-30: qty 28.35

## 2022-01-30 MED ORDER — LIDOCAINE-EPINEPHRINE (PF) 2 %-1:200000 IJ SOLN
20.0000 mL | Freq: Once | INTRAMUSCULAR | Status: AC
  Administered 2022-01-30: 20 mL
  Filled 2022-01-30: qty 20

## 2022-01-30 NOTE — Discharge Instructions (Addendum)
Your staples 5 staples should be removed in 7 to 10 days.  Your primary care provider, Dr. Clelia Croft, should be able to remove these.  If not you may go to an urgent care.  Use the antibiotic ointment daily.  Return with any worsening symptoms. ?

## 2022-01-30 NOTE — ED Provider Notes (Signed)
?[facility] ?Provider Note ? ? ?CSN: [id number] ?Arrival date & time: 01/30/22  1054 ? ?  ? ?History ? ?No chief complaint on file. ? ? ?Zachary [name] is a 71 y.o. male with a past medical history of Parkinson's disease and frequent falls presenting today after a fall.  His wife reports that he has a history of falling and was supposed to be using his wheelchair this morning however got up with his walker and was likely moving too fast when he fell and hit his head on a metal shelf.  She did not witness the fall.  He thinks that he told someone that he felt as though he was going to fall but he is not fully sure.  No history of syncope/seizure.  No history of arrhythmia.  Rates his pain in his had a 2/10.  Is not on blood thinners.  Wife reports he has a history of hypotension, recently started on midodrine. ? ?HPI ? ?  ? ?Home Medications ?Prior to Admission medications   ?Medication Sig Start Date End Date Taking? Authorizing Provider  ?AMBULATORY NON FORMULARY MEDICATION Transport chair ?Dx: Parkinsons Disease G20 12/24/19   [name], Octaviano Battyebecca S, DO  ?carbidopa-levodopa (SINEMET CR) 50-200 MG tablet TAKE ONE TABLET BY MOUTH AT BEDTIME 01/03/22   [name], Octaviano Battyebecca S, DO  ?carbidopa-levodopa (SINEMET IR) 25-100 MG tablet 3 tabs AT 6:30AM/2 at 8:30AM/2 at 10:30AM/2 at 12:30PM/2 at 2:30PM/2 at 4:30pm/2 at 6:30pm. take up to extra tablet (in 1/2 tabs) per day AS NEEDED 09/05/21   [name], Octaviano Battyebecca S, DO  ?Cholecalciferol (DIALYVITE VITAMIN D 5000 PO) Take 1 tablet by mouth daily.    [provider]  ?entacapone (COMTAN) 200 MG tablet Take 1 tablet (200 mg total) by mouth 3 (three) times daily (8:30/12:30/4:30). 12/04/21   [name], Octaviano Battyebecca S, DO  ?finasteride (PROSCAR) 5 MG tablet Take 1 tablet by mouth daily. 12/29/21   [provider]  ?fludrocortisone (FLORINEF) 0.1 MG tablet Take 2 tablets (0.2 mg total) by mouth 2 (two) times daily. 07/04/21   Marinus Mawaylor, Gregg W, MD  ?Lactobacillus (PROBIOTIC  ACIDOPHILUS PO) Take 1 tablet by mouth daily.    [provider]  ?midodrine (PROAMATINE) 5 MG tablet TAKE ONE TABLET BY MOUTH THREE TIMES A DAILY WITH MEALS 01/18/22   [name], Octaviano Battyebecca S, DO  ?Multiple Vitamins-Minerals (PRESERVISION AREDS 2 PO) Take 1 tablet by mouth daily.    [provider]  ?tamsulosin (FLOMAX) 0.4 MG CAPS capsule Take 1 capsule by mouth daily. 12/29/21   [provider]  ?   ? ?Allergies    ?Tree extract   ? ?Review of Systems   ?Review of Systems ? ?Physical Exam ?Updated Vital Signs ?There were no vitals taken for this visit. ?Physical Exam ?Vitals and nursing note reviewed.  ?Constitutional:   ?   Appearance: Normal appearance.  ?HENT:  ?   Head: Normocephalic.  ?   Comments: 2.5 cm laceration to left parietal skull.  No exposed bone.  Bleeding controlled. ?Eyes:  ?   General: No scleral icterus. ?   Conjunctiva/sclera: Conjunctivae normal.  ?   Pupils: Pupils are equal, round, and reactive to light.  ?Cardiovascular:  ?   Rate and Rhythm: Normal rate and regular rhythm.  ?Pulmonary:  ?   Effort: Pulmonary effort is normal. No respiratory distress.  ?   Breath sounds: No wheezing.  ?Abdominal:  ?   General: Abdomen is flat.  ?   Palpations: Abdomen is soft.  ?  Tenderness: There is no abdominal tenderness. There is no guarding.  ?Musculoskeletal:  ?   Cervical back: Normal range of motion.  ?Skin: ?   Findings: No rash.  ?Neurological:  ?   Mental Status: He is alert.  ?Psychiatric:     ?   Mood and Affect: Mood normal.  ? ? ?ED Results / Procedures / Treatments   ?Labs ?(all labs ordered are listed, but only abnormal results are displayed) ?Labs Reviewed - No data to display ? ?EKG ?None ? ?Radiology ?No results found. ? ?Procedures ?Marland Kitchen.Laceration Repair ? ?Date/Time: 01/30/2022 12:51 PM ?Performed by: Saddie Benders, PA-C ?Authorized by: Saddie Benders, PA-C  ? ?Consent:  ?  Consent obtained:  Verbal ?  Consent given by:  Patient ?  Risks discussed:  Infection,  need for additional repair, pain, poor cosmetic result and poor wound healing ?  Alternatives discussed:  No treatment and delayed treatment ?Universal protocol:  ?  Procedure explained and questions answered to patient or proxy's satisfaction: yes   ?  Relevant documents present and verified: yes   ?  Test results available: yes   ?  Imaging studies available: yes   ?  Required blood products, implants, devices, and special equipment available: yes   ?  Site/side marked: yes   ?  Immediately prior to procedure, a time out was called: yes   ?  Patient identity confirmed:  Verbally with patient ?Anesthesia:  ?  Anesthesia method:  Local infiltration ?  Local anesthetic:  Lidocaine 2% WITH epi ?Laceration details:  ?  Location:  Scalp ?  Scalp location:  L parietal ?  Length (cm):  3 ?  Depth (mm):  1 ?Pre-procedure details:  ?  Preparation:  Patient was prepped and draped in usual sterile fashion and imaging obtained to evaluate for foreign bodies ?Exploration:  ?  Limited defect created (wound extended): yes   ?  Hemostasis achieved with:  Direct pressure ?  Imaging outcome: foreign body not noted   ?  Wound exploration: wound explored through full range of motion and entire depth of wound visualized   ?  Wound extent: no nerve damage noted, no underlying fracture noted and no vascular damage noted   ?  Contaminated: no   ?Treatment:  ?  Area cleansed with:  Povidone-iodine ?  Amount of cleaning:  Standard ?  Irrigation solution:  Sterile water ?  Irrigation volume:  200cc ?  Irrigation method:  Syringe ?  Visualized foreign bodies/material removed: no   ?  Debridement:  None ?  Undermining:  None ?Skin repair:  ?  Repair method:  Staples ?  Number of staples:  5 ?Approximation:  ?  Approximation:  Close ?Repair type:  ?  Repair type:  Simple ?Post-procedure details:  ?  Dressing:  Antibiotic ointment ?  Procedure completion:  Tolerated well, no immediate complications  ? ? ?Medications Ordered in ED ?Medications -  No data to display ? ?ED Course/ Medical Decision Making/ A&P ?  ?                        ?Medical Decision Making ?Amount and/or Complexity of Data Reviewed ?Labs: ordered. ?Radiology: ordered. ? ?Risk ?OTC drugs. ?Prescription drug management. ? ? ?Patient presents to the ED for concern of a fall.  Differential includes but is not limited to mechanical fall, arrhythmia, seizure, syncope, ICH, ischemia, hypertensive emergency, brain mass ? ?Co morbidities that complicate the  patient evaluation include: Parkinson's ? ?Per internal/external chart review: Patient was recently started on midodrine for his hypotension however no other changes in his medication regimen.  Per chart review, he has a history of orthostatic hypotension.  Potentially contributing to his falls along with his Parkinson's. ? ? ?I performed a full physical exam, pertinent findings include: ?3 cm laceration to the left parietal skull ? ?Diagnostics: ? ?I ordered and viewed labs. The pertinent results include:  ?CBC and BMP without concerns ? ?I ordered and individually viewed patient's head CT head and cervical spine. ? ?Cardiac Monitoring: ? ?The patient was maintained on a cardiac monitor.  I personally viewed and interpreted the cardiac monitored which showed this rhythm with a normal rate ? ? ? ?Treatment: ? ?I placed 5 staples in patient's laceration.  He tolerated this well. ? ? ?MDM/Disposition: ? ?Blood work normal, EKG without concerns and imaging negative.  I believe this presentation is consistent with a mechanical fall.  At this time I believe patient is stable for discharge to his living facility with wound care instructions, bacitracin, and instructions to follow-up for staple removal in 7 to 10 days.  He and his wife are agreeable. ? ? ? ? ? ? ? ?Final Clinical Impression(s) / ED Diagnoses ?Final diagnoses:  ?Laceration of scalp, initial encounter  ? ? ?Rx / DC Orders ?Results and diagnoses were explained to the patient and his  wife. Return precautions discussed in full.  They had no additional questions and expressed complete understanding. ? ? ?This chart was dictated using voice recognition software.  Despite best efforts to proofread

## 2022-01-30 NOTE — ED Triage Notes (Signed)
Patient reports to the ER for a fall with bleeding from the head. Patient has a hx of parkinson's and was moving a little too fast and reports hitting his head. Patient denies LOC ?

## 2022-01-31 DIAGNOSIS — R296 Repeated falls: Secondary | ICD-10-CM | POA: Diagnosis not present

## 2022-01-31 DIAGNOSIS — N3946 Mixed incontinence: Secondary | ICD-10-CM | POA: Diagnosis not present

## 2022-01-31 DIAGNOSIS — R2689 Other abnormalities of gait and mobility: Secondary | ICD-10-CM | POA: Diagnosis not present

## 2022-01-31 DIAGNOSIS — G20 Parkinson's disease: Secondary | ICD-10-CM | POA: Diagnosis not present

## 2022-01-31 DIAGNOSIS — M6281 Muscle weakness (generalized): Secondary | ICD-10-CM | POA: Diagnosis not present

## 2022-02-01 ENCOUNTER — Telehealth: Payer: Self-pay | Admitting: Neurology

## 2022-02-01 DIAGNOSIS — N3946 Mixed incontinence: Secondary | ICD-10-CM | POA: Diagnosis not present

## 2022-02-01 DIAGNOSIS — R296 Repeated falls: Secondary | ICD-10-CM | POA: Diagnosis not present

## 2022-02-01 DIAGNOSIS — G20 Parkinson's disease: Secondary | ICD-10-CM | POA: Diagnosis not present

## 2022-02-01 DIAGNOSIS — M6281 Muscle weakness (generalized): Secondary | ICD-10-CM | POA: Diagnosis not present

## 2022-02-01 NOTE — Telephone Encounter (Signed)
Patient's wife Clarene Critchley called and said in the late afternoon and evening he is getting kind of crazy again. ? ?He is at [name] currently. ? ?She said she is concerned about his medication, entacapone, and wants to know if perhaps he needs to quit taking that medication. ? ?She is aware Dr. Carles Collet is currently on vacation. ?

## 2022-02-02 DIAGNOSIS — R2689 Other abnormalities of gait and mobility: Secondary | ICD-10-CM | POA: Diagnosis not present

## 2022-02-02 DIAGNOSIS — R296 Repeated falls: Secondary | ICD-10-CM | POA: Diagnosis not present

## 2022-02-02 DIAGNOSIS — G20 Parkinson's disease: Secondary | ICD-10-CM | POA: Diagnosis not present

## 2022-02-02 DIAGNOSIS — N3946 Mixed incontinence: Secondary | ICD-10-CM | POA: Diagnosis not present

## 2022-02-02 DIAGNOSIS — M6281 Muscle weakness (generalized): Secondary | ICD-10-CM | POA: Diagnosis not present

## 2022-02-05 ENCOUNTER — Telehealth: Payer: Self-pay | Admitting: Neurology

## 2022-02-05 DIAGNOSIS — I951 Orthostatic hypotension: Secondary | ICD-10-CM | POA: Diagnosis not present

## 2022-02-05 DIAGNOSIS — F02A18 Dementia in other diseases classified elsewhere, mild, with other behavioral disturbance: Secondary | ICD-10-CM | POA: Diagnosis not present

## 2022-02-05 DIAGNOSIS — M6281 Muscle weakness (generalized): Secondary | ICD-10-CM | POA: Diagnosis not present

## 2022-02-05 DIAGNOSIS — R82998 Other abnormal findings in urine: Secondary | ICD-10-CM | POA: Diagnosis not present

## 2022-02-05 DIAGNOSIS — R4189 Other symptoms and signs involving cognitive functions and awareness: Secondary | ICD-10-CM | POA: Diagnosis not present

## 2022-02-05 DIAGNOSIS — R4689 Other symptoms and signs involving appearance and behavior: Secondary | ICD-10-CM | POA: Diagnosis not present

## 2022-02-05 DIAGNOSIS — R2689 Other abnormalities of gait and mobility: Secondary | ICD-10-CM | POA: Diagnosis not present

## 2022-02-05 DIAGNOSIS — Z4802 Encounter for removal of sutures: Secondary | ICD-10-CM | POA: Diagnosis not present

## 2022-02-05 DIAGNOSIS — G20 Parkinson's disease: Secondary | ICD-10-CM | POA: Diagnosis not present

## 2022-02-05 DIAGNOSIS — R296 Repeated falls: Secondary | ICD-10-CM | POA: Diagnosis not present

## 2022-02-05 DIAGNOSIS — N3946 Mixed incontinence: Secondary | ICD-10-CM | POA: Diagnosis not present

## 2022-02-05 NOTE — Telephone Encounter (Signed)
Spoke with patients wife. Patient acting very erratically and having outbursts at [facility] were he is currently living. Patients wife did tell me that they are moving him to the memory care unit. Patient going to PCP today to get staples removed from a fall. Patients wife agreed to get him screened for urinary infection. Patients wife asking about Lumbar puncture that we had scheduled in the past. Dr. Arbutus Leas feeling this will not be productive at this time  ?

## 2022-02-05 NOTE — Telephone Encounter (Signed)
Patient is wearing a abdominal binder and compression socks. Patient taking several different kinds of therapy at [facility]  ?[facility]  ?

## 2022-02-05 NOTE — Telephone Encounter (Signed)
Patients wife called and asked to send over an order for 1 entacapone for 10:30 Am to the Assisted living [facility]. ?

## 2022-02-05 NOTE — Telephone Encounter (Signed)
Patient's wife [name] called and said the patient's results came back negative for a UTI. ? ?She'd like a call back from [place]. ?

## 2022-02-06 DIAGNOSIS — R296 Repeated falls: Secondary | ICD-10-CM | POA: Diagnosis not present

## 2022-02-06 DIAGNOSIS — N3946 Mixed incontinence: Secondary | ICD-10-CM | POA: Diagnosis not present

## 2022-02-06 DIAGNOSIS — G20 Parkinson's disease: Secondary | ICD-10-CM | POA: Diagnosis not present

## 2022-02-06 DIAGNOSIS — M6281 Muscle weakness (generalized): Secondary | ICD-10-CM | POA: Diagnosis not present

## 2022-02-06 NOTE — Telephone Encounter (Signed)
Called patients wife back and let her know Dr. Carles Collet is out of the office until Monday. [facility] also sent me a request to Discontinue the medication. I asked wife because I had told her yesterday that the patient could discontinue. She felt like he needs the one in the morning but feels that the other two doses are too much and leading to his behavior issues  ?

## 2022-02-07 DIAGNOSIS — R2689 Other abnormalities of gait and mobility: Secondary | ICD-10-CM | POA: Diagnosis not present

## 2022-02-07 DIAGNOSIS — M6281 Muscle weakness (generalized): Secondary | ICD-10-CM | POA: Diagnosis not present

## 2022-02-07 DIAGNOSIS — R296 Repeated falls: Secondary | ICD-10-CM | POA: Diagnosis not present

## 2022-02-07 DIAGNOSIS — N3946 Mixed incontinence: Secondary | ICD-10-CM | POA: Diagnosis not present

## 2022-02-07 DIAGNOSIS — G20 Parkinson's disease: Secondary | ICD-10-CM | POA: Diagnosis not present

## 2022-02-08 DIAGNOSIS — M6281 Muscle weakness (generalized): Secondary | ICD-10-CM | POA: Diagnosis not present

## 2022-02-08 DIAGNOSIS — N3946 Mixed incontinence: Secondary | ICD-10-CM | POA: Diagnosis not present

## 2022-02-08 DIAGNOSIS — G20 Parkinson's disease: Secondary | ICD-10-CM | POA: Diagnosis not present

## 2022-02-08 DIAGNOSIS — R296 Repeated falls: Secondary | ICD-10-CM | POA: Diagnosis not present

## 2022-02-09 DIAGNOSIS — R296 Repeated falls: Secondary | ICD-10-CM | POA: Diagnosis not present

## 2022-02-09 DIAGNOSIS — G20 Parkinson's disease: Secondary | ICD-10-CM | POA: Diagnosis not present

## 2022-02-09 DIAGNOSIS — M6281 Muscle weakness (generalized): Secondary | ICD-10-CM | POA: Diagnosis not present

## 2022-02-09 DIAGNOSIS — N3946 Mixed incontinence: Secondary | ICD-10-CM | POA: Diagnosis not present

## 2022-02-09 DIAGNOSIS — R2689 Other abnormalities of gait and mobility: Secondary | ICD-10-CM | POA: Diagnosis not present

## 2022-02-12 DIAGNOSIS — M6281 Muscle weakness (generalized): Secondary | ICD-10-CM | POA: Diagnosis not present

## 2022-02-12 DIAGNOSIS — R2689 Other abnormalities of gait and mobility: Secondary | ICD-10-CM | POA: Diagnosis not present

## 2022-02-12 DIAGNOSIS — G20 Parkinson's disease: Secondary | ICD-10-CM | POA: Diagnosis not present

## 2022-02-13 DIAGNOSIS — G20 Parkinson's disease: Secondary | ICD-10-CM | POA: Diagnosis not present

## 2022-02-13 DIAGNOSIS — R296 Repeated falls: Secondary | ICD-10-CM | POA: Diagnosis not present

## 2022-02-13 DIAGNOSIS — M6281 Muscle weakness (generalized): Secondary | ICD-10-CM | POA: Diagnosis not present

## 2022-02-13 DIAGNOSIS — N3946 Mixed incontinence: Secondary | ICD-10-CM | POA: Diagnosis not present

## 2022-02-14 DIAGNOSIS — R2689 Other abnormalities of gait and mobility: Secondary | ICD-10-CM | POA: Diagnosis not present

## 2022-02-14 DIAGNOSIS — G20 Parkinson's disease: Secondary | ICD-10-CM | POA: Diagnosis not present

## 2022-02-14 DIAGNOSIS — N3946 Mixed incontinence: Secondary | ICD-10-CM | POA: Diagnosis not present

## 2022-02-14 DIAGNOSIS — R296 Repeated falls: Secondary | ICD-10-CM | POA: Diagnosis not present

## 2022-02-14 DIAGNOSIS — M6281 Muscle weakness (generalized): Secondary | ICD-10-CM | POA: Diagnosis not present

## 2022-02-15 DIAGNOSIS — R296 Repeated falls: Secondary | ICD-10-CM | POA: Diagnosis not present

## 2022-02-15 DIAGNOSIS — M6281 Muscle weakness (generalized): Secondary | ICD-10-CM | POA: Diagnosis not present

## 2022-02-15 DIAGNOSIS — N3946 Mixed incontinence: Secondary | ICD-10-CM | POA: Diagnosis not present

## 2022-02-15 DIAGNOSIS — G20 Parkinson's disease: Secondary | ICD-10-CM | POA: Diagnosis not present

## 2022-02-16 DIAGNOSIS — N3946 Mixed incontinence: Secondary | ICD-10-CM | POA: Diagnosis not present

## 2022-02-16 DIAGNOSIS — R296 Repeated falls: Secondary | ICD-10-CM | POA: Diagnosis not present

## 2022-02-16 DIAGNOSIS — G20 Parkinson's disease: Secondary | ICD-10-CM | POA: Diagnosis not present

## 2022-02-16 DIAGNOSIS — R2689 Other abnormalities of gait and mobility: Secondary | ICD-10-CM | POA: Diagnosis not present

## 2022-02-16 DIAGNOSIS — M6281 Muscle weakness (generalized): Secondary | ICD-10-CM | POA: Diagnosis not present

## 2022-02-19 DIAGNOSIS — G20 Parkinson's disease: Secondary | ICD-10-CM | POA: Diagnosis not present

## 2022-02-19 DIAGNOSIS — R296 Repeated falls: Secondary | ICD-10-CM | POA: Diagnosis not present

## 2022-02-19 DIAGNOSIS — N3946 Mixed incontinence: Secondary | ICD-10-CM | POA: Diagnosis not present

## 2022-02-19 DIAGNOSIS — R2689 Other abnormalities of gait and mobility: Secondary | ICD-10-CM | POA: Diagnosis not present

## 2022-02-19 DIAGNOSIS — M6281 Muscle weakness (generalized): Secondary | ICD-10-CM | POA: Diagnosis not present

## 2022-02-20 DIAGNOSIS — G20 Parkinson's disease: Secondary | ICD-10-CM | POA: Diagnosis not present

## 2022-02-20 DIAGNOSIS — M6281 Muscle weakness (generalized): Secondary | ICD-10-CM | POA: Diagnosis not present

## 2022-02-20 DIAGNOSIS — N3946 Mixed incontinence: Secondary | ICD-10-CM | POA: Diagnosis not present

## 2022-02-20 DIAGNOSIS — R296 Repeated falls: Secondary | ICD-10-CM | POA: Diagnosis not present

## 2022-02-21 DIAGNOSIS — R2689 Other abnormalities of gait and mobility: Secondary | ICD-10-CM | POA: Diagnosis not present

## 2022-02-21 DIAGNOSIS — G20 Parkinson's disease: Secondary | ICD-10-CM | POA: Diagnosis not present

## 2022-02-21 DIAGNOSIS — M6281 Muscle weakness (generalized): Secondary | ICD-10-CM | POA: Diagnosis not present

## 2022-02-22 DIAGNOSIS — G20 Parkinson's disease: Secondary | ICD-10-CM | POA: Diagnosis not present

## 2022-02-22 DIAGNOSIS — M6281 Muscle weakness (generalized): Secondary | ICD-10-CM | POA: Diagnosis not present

## 2022-02-22 DIAGNOSIS — R296 Repeated falls: Secondary | ICD-10-CM | POA: Diagnosis not present

## 2022-02-22 DIAGNOSIS — N3946 Mixed incontinence: Secondary | ICD-10-CM | POA: Diagnosis not present

## 2022-02-23 ENCOUNTER — Ambulatory Visit: Payer: Medicare PPO | Admitting: Neurology

## 2022-02-23 DIAGNOSIS — R296 Repeated falls: Secondary | ICD-10-CM | POA: Diagnosis not present

## 2022-02-23 DIAGNOSIS — M6281 Muscle weakness (generalized): Secondary | ICD-10-CM | POA: Diagnosis not present

## 2022-02-23 DIAGNOSIS — R2689 Other abnormalities of gait and mobility: Secondary | ICD-10-CM | POA: Diagnosis not present

## 2022-02-23 DIAGNOSIS — G20 Parkinson's disease: Secondary | ICD-10-CM | POA: Diagnosis not present

## 2022-02-23 DIAGNOSIS — N3946 Mixed incontinence: Secondary | ICD-10-CM | POA: Diagnosis not present

## 2022-02-25 DIAGNOSIS — R2689 Other abnormalities of gait and mobility: Secondary | ICD-10-CM | POA: Diagnosis not present

## 2022-02-25 DIAGNOSIS — M6281 Muscle weakness (generalized): Secondary | ICD-10-CM | POA: Diagnosis not present

## 2022-02-25 DIAGNOSIS — G20 Parkinson's disease: Secondary | ICD-10-CM | POA: Diagnosis not present

## 2022-02-26 DIAGNOSIS — G20 Parkinson's disease: Secondary | ICD-10-CM | POA: Diagnosis not present

## 2022-02-26 DIAGNOSIS — M6281 Muscle weakness (generalized): Secondary | ICD-10-CM | POA: Diagnosis not present

## 2022-02-26 DIAGNOSIS — R296 Repeated falls: Secondary | ICD-10-CM | POA: Diagnosis not present

## 2022-02-26 DIAGNOSIS — R2689 Other abnormalities of gait and mobility: Secondary | ICD-10-CM | POA: Diagnosis not present

## 2022-02-26 DIAGNOSIS — N3946 Mixed incontinence: Secondary | ICD-10-CM | POA: Diagnosis not present

## 2022-02-27 DIAGNOSIS — R490 Dysphonia: Secondary | ICD-10-CM | POA: Diagnosis not present

## 2022-02-27 DIAGNOSIS — M6281 Muscle weakness (generalized): Secondary | ICD-10-CM | POA: Diagnosis not present

## 2022-02-27 DIAGNOSIS — R41841 Cognitive communication deficit: Secondary | ICD-10-CM | POA: Diagnosis not present

## 2022-02-27 DIAGNOSIS — G20 Parkinson's disease: Secondary | ICD-10-CM | POA: Diagnosis not present

## 2022-02-27 DIAGNOSIS — N3946 Mixed incontinence: Secondary | ICD-10-CM | POA: Diagnosis not present

## 2022-02-27 DIAGNOSIS — R488 Other symbolic dysfunctions: Secondary | ICD-10-CM | POA: Diagnosis not present

## 2022-02-27 DIAGNOSIS — R296 Repeated falls: Secondary | ICD-10-CM | POA: Diagnosis not present

## 2022-02-28 DIAGNOSIS — R296 Repeated falls: Secondary | ICD-10-CM | POA: Diagnosis not present

## 2022-02-28 DIAGNOSIS — G20 Parkinson's disease: Secondary | ICD-10-CM | POA: Diagnosis not present

## 2022-02-28 DIAGNOSIS — N3946 Mixed incontinence: Secondary | ICD-10-CM | POA: Diagnosis not present

## 2022-02-28 DIAGNOSIS — R2689 Other abnormalities of gait and mobility: Secondary | ICD-10-CM | POA: Diagnosis not present

## 2022-02-28 DIAGNOSIS — R488 Other symbolic dysfunctions: Secondary | ICD-10-CM | POA: Diagnosis not present

## 2022-02-28 DIAGNOSIS — M6281 Muscle weakness (generalized): Secondary | ICD-10-CM | POA: Diagnosis not present

## 2022-02-28 DIAGNOSIS — R41841 Cognitive communication deficit: Secondary | ICD-10-CM | POA: Diagnosis not present

## 2022-02-28 DIAGNOSIS — R490 Dysphonia: Secondary | ICD-10-CM | POA: Diagnosis not present

## 2022-03-02 DIAGNOSIS — M6281 Muscle weakness (generalized): Secondary | ICD-10-CM | POA: Diagnosis not present

## 2022-03-02 DIAGNOSIS — N3946 Mixed incontinence: Secondary | ICD-10-CM | POA: Diagnosis not present

## 2022-03-02 DIAGNOSIS — G20 Parkinson's disease: Secondary | ICD-10-CM | POA: Diagnosis not present

## 2022-03-02 DIAGNOSIS — R296 Repeated falls: Secondary | ICD-10-CM | POA: Diagnosis not present

## 2022-03-05 DIAGNOSIS — R488 Other symbolic dysfunctions: Secondary | ICD-10-CM | POA: Diagnosis not present

## 2022-03-05 DIAGNOSIS — G20 Parkinson's disease: Secondary | ICD-10-CM | POA: Diagnosis not present

## 2022-03-05 DIAGNOSIS — R41841 Cognitive communication deficit: Secondary | ICD-10-CM | POA: Diagnosis not present

## 2022-03-05 DIAGNOSIS — N3946 Mixed incontinence: Secondary | ICD-10-CM | POA: Diagnosis not present

## 2022-03-05 DIAGNOSIS — M6281 Muscle weakness (generalized): Secondary | ICD-10-CM | POA: Diagnosis not present

## 2022-03-05 DIAGNOSIS — R2689 Other abnormalities of gait and mobility: Secondary | ICD-10-CM | POA: Diagnosis not present

## 2022-03-05 DIAGNOSIS — R490 Dysphonia: Secondary | ICD-10-CM | POA: Diagnosis not present

## 2022-03-05 DIAGNOSIS — R296 Repeated falls: Secondary | ICD-10-CM | POA: Diagnosis not present

## 2022-03-06 DIAGNOSIS — M6281 Muscle weakness (generalized): Secondary | ICD-10-CM | POA: Diagnosis not present

## 2022-03-06 DIAGNOSIS — R41841 Cognitive communication deficit: Secondary | ICD-10-CM | POA: Diagnosis not present

## 2022-03-06 DIAGNOSIS — R490 Dysphonia: Secondary | ICD-10-CM | POA: Diagnosis not present

## 2022-03-06 DIAGNOSIS — G20 Parkinson's disease: Secondary | ICD-10-CM | POA: Diagnosis not present

## 2022-03-06 DIAGNOSIS — R296 Repeated falls: Secondary | ICD-10-CM | POA: Diagnosis not present

## 2022-03-06 DIAGNOSIS — R488 Other symbolic dysfunctions: Secondary | ICD-10-CM | POA: Diagnosis not present

## 2022-03-06 DIAGNOSIS — N3946 Mixed incontinence: Secondary | ICD-10-CM | POA: Diagnosis not present

## 2022-03-07 DIAGNOSIS — R296 Repeated falls: Secondary | ICD-10-CM | POA: Diagnosis not present

## 2022-03-07 DIAGNOSIS — N3946 Mixed incontinence: Secondary | ICD-10-CM | POA: Diagnosis not present

## 2022-03-07 DIAGNOSIS — R2689 Other abnormalities of gait and mobility: Secondary | ICD-10-CM | POA: Diagnosis not present

## 2022-03-07 DIAGNOSIS — M6281 Muscle weakness (generalized): Secondary | ICD-10-CM | POA: Diagnosis not present

## 2022-03-07 DIAGNOSIS — G20 Parkinson's disease: Secondary | ICD-10-CM | POA: Diagnosis not present

## 2022-03-08 DIAGNOSIS — N3946 Mixed incontinence: Secondary | ICD-10-CM | POA: Diagnosis not present

## 2022-03-08 DIAGNOSIS — G20 Parkinson's disease: Secondary | ICD-10-CM | POA: Diagnosis not present

## 2022-03-08 DIAGNOSIS — R296 Repeated falls: Secondary | ICD-10-CM | POA: Diagnosis not present

## 2022-03-08 DIAGNOSIS — M6281 Muscle weakness (generalized): Secondary | ICD-10-CM | POA: Diagnosis not present

## 2022-03-09 DIAGNOSIS — R296 Repeated falls: Secondary | ICD-10-CM | POA: Diagnosis not present

## 2022-03-09 DIAGNOSIS — G20 Parkinson's disease: Secondary | ICD-10-CM | POA: Diagnosis not present

## 2022-03-09 DIAGNOSIS — M6281 Muscle weakness (generalized): Secondary | ICD-10-CM | POA: Diagnosis not present

## 2022-03-09 DIAGNOSIS — R2689 Other abnormalities of gait and mobility: Secondary | ICD-10-CM | POA: Diagnosis not present

## 2022-03-09 DIAGNOSIS — N3946 Mixed incontinence: Secondary | ICD-10-CM | POA: Diagnosis not present

## 2022-03-12 DIAGNOSIS — R296 Repeated falls: Secondary | ICD-10-CM | POA: Diagnosis not present

## 2022-03-12 DIAGNOSIS — R2689 Other abnormalities of gait and mobility: Secondary | ICD-10-CM | POA: Diagnosis not present

## 2022-03-12 DIAGNOSIS — R490 Dysphonia: Secondary | ICD-10-CM | POA: Diagnosis not present

## 2022-03-12 DIAGNOSIS — G20 Parkinson's disease: Secondary | ICD-10-CM | POA: Diagnosis not present

## 2022-03-12 DIAGNOSIS — M6281 Muscle weakness (generalized): Secondary | ICD-10-CM | POA: Diagnosis not present

## 2022-03-12 DIAGNOSIS — R41841 Cognitive communication deficit: Secondary | ICD-10-CM | POA: Diagnosis not present

## 2022-03-12 DIAGNOSIS — R488 Other symbolic dysfunctions: Secondary | ICD-10-CM | POA: Diagnosis not present

## 2022-03-12 DIAGNOSIS — N3946 Mixed incontinence: Secondary | ICD-10-CM | POA: Diagnosis not present

## 2022-03-13 DIAGNOSIS — N3946 Mixed incontinence: Secondary | ICD-10-CM | POA: Diagnosis not present

## 2022-03-13 DIAGNOSIS — R488 Other symbolic dysfunctions: Secondary | ICD-10-CM | POA: Diagnosis not present

## 2022-03-13 DIAGNOSIS — M6281 Muscle weakness (generalized): Secondary | ICD-10-CM | POA: Diagnosis not present

## 2022-03-13 DIAGNOSIS — R41841 Cognitive communication deficit: Secondary | ICD-10-CM | POA: Diagnosis not present

## 2022-03-13 DIAGNOSIS — R490 Dysphonia: Secondary | ICD-10-CM | POA: Diagnosis not present

## 2022-03-13 DIAGNOSIS — R296 Repeated falls: Secondary | ICD-10-CM | POA: Diagnosis not present

## 2022-03-13 DIAGNOSIS — G20 Parkinson's disease: Secondary | ICD-10-CM | POA: Diagnosis not present

## 2022-03-14 DIAGNOSIS — R296 Repeated falls: Secondary | ICD-10-CM | POA: Diagnosis not present

## 2022-03-14 DIAGNOSIS — R2689 Other abnormalities of gait and mobility: Secondary | ICD-10-CM | POA: Diagnosis not present

## 2022-03-14 DIAGNOSIS — R41841 Cognitive communication deficit: Secondary | ICD-10-CM | POA: Diagnosis not present

## 2022-03-14 DIAGNOSIS — R488 Other symbolic dysfunctions: Secondary | ICD-10-CM | POA: Diagnosis not present

## 2022-03-14 DIAGNOSIS — R490 Dysphonia: Secondary | ICD-10-CM | POA: Diagnosis not present

## 2022-03-14 DIAGNOSIS — G20 Parkinson's disease: Secondary | ICD-10-CM | POA: Diagnosis not present

## 2022-03-14 DIAGNOSIS — M6281 Muscle weakness (generalized): Secondary | ICD-10-CM | POA: Diagnosis not present

## 2022-03-14 DIAGNOSIS — N3946 Mixed incontinence: Secondary | ICD-10-CM | POA: Diagnosis not present

## 2022-03-15 DIAGNOSIS — R488 Other symbolic dysfunctions: Secondary | ICD-10-CM | POA: Diagnosis not present

## 2022-03-15 DIAGNOSIS — R41841 Cognitive communication deficit: Secondary | ICD-10-CM | POA: Diagnosis not present

## 2022-03-15 DIAGNOSIS — M6281 Muscle weakness (generalized): Secondary | ICD-10-CM | POA: Diagnosis not present

## 2022-03-15 DIAGNOSIS — R296 Repeated falls: Secondary | ICD-10-CM | POA: Diagnosis not present

## 2022-03-15 DIAGNOSIS — G20 Parkinson's disease: Secondary | ICD-10-CM | POA: Diagnosis not present

## 2022-03-15 DIAGNOSIS — R490 Dysphonia: Secondary | ICD-10-CM | POA: Diagnosis not present

## 2022-03-15 DIAGNOSIS — N3946 Mixed incontinence: Secondary | ICD-10-CM | POA: Diagnosis not present

## 2022-03-16 DIAGNOSIS — R2689 Other abnormalities of gait and mobility: Secondary | ICD-10-CM | POA: Diagnosis not present

## 2022-03-16 DIAGNOSIS — R41841 Cognitive communication deficit: Secondary | ICD-10-CM | POA: Diagnosis not present

## 2022-03-16 DIAGNOSIS — G20 Parkinson's disease: Secondary | ICD-10-CM | POA: Diagnosis not present

## 2022-03-16 DIAGNOSIS — M6281 Muscle weakness (generalized): Secondary | ICD-10-CM | POA: Diagnosis not present

## 2022-03-16 DIAGNOSIS — R488 Other symbolic dysfunctions: Secondary | ICD-10-CM | POA: Diagnosis not present

## 2022-03-16 DIAGNOSIS — R296 Repeated falls: Secondary | ICD-10-CM | POA: Diagnosis not present

## 2022-03-16 DIAGNOSIS — N3946 Mixed incontinence: Secondary | ICD-10-CM | POA: Diagnosis not present

## 2022-03-16 DIAGNOSIS — R490 Dysphonia: Secondary | ICD-10-CM | POA: Diagnosis not present

## 2022-03-18 ENCOUNTER — Other Ambulatory Visit: Payer: Self-pay

## 2022-03-18 ENCOUNTER — Emergency Department (HOSPITAL_BASED_OUTPATIENT_CLINIC_OR_DEPARTMENT_OTHER): Payer: Medicare PPO

## 2022-03-18 ENCOUNTER — Emergency Department (HOSPITAL_COMMUNITY): Payer: Medicare PPO

## 2022-03-18 ENCOUNTER — Emergency Department (HOSPITAL_BASED_OUTPATIENT_CLINIC_OR_DEPARTMENT_OTHER)
Admission: EM | Admit: 2022-03-18 | Discharge: 2022-03-18 | Disposition: A | Discharge status: Home / Self Care | Payer: Medicare PPO | Attending: Emergency Medicine | Admitting: Emergency Medicine

## 2022-03-18 ENCOUNTER — Encounter (HOSPITAL_BASED_OUTPATIENT_CLINIC_OR_DEPARTMENT_OTHER): Payer: Self-pay | Admitting: Emergency Medicine

## 2022-03-18 DIAGNOSIS — S62502A Fracture of unspecified phalanx of left thumb, initial encounter for closed fracture: Secondary | ICD-10-CM

## 2022-03-18 DIAGNOSIS — J3489 Other specified disorders of nose and nasal sinuses: Secondary | ICD-10-CM | POA: Diagnosis not present

## 2022-03-18 DIAGNOSIS — S0181XA Laceration without foreign body of other part of head, initial encounter: Secondary | ICD-10-CM | POA: Diagnosis not present

## 2022-03-18 DIAGNOSIS — S62511A Displaced fracture of proximal phalanx of right thumb, initial encounter for closed fracture: Secondary | ICD-10-CM | POA: Diagnosis not present

## 2022-03-18 DIAGNOSIS — S62514A Nondisplaced fracture of proximal phalanx of right thumb, initial encounter for closed fracture: Secondary | ICD-10-CM | POA: Insufficient documentation

## 2022-03-18 DIAGNOSIS — R29818 Other symptoms and signs involving the nervous system: Secondary | ICD-10-CM | POA: Diagnosis not present

## 2022-03-18 DIAGNOSIS — I6782 Cerebral ischemia: Secondary | ICD-10-CM | POA: Diagnosis not present

## 2022-03-18 DIAGNOSIS — W19XXXA Unspecified fall, initial encounter: Secondary | ICD-10-CM | POA: Insufficient documentation

## 2022-03-18 DIAGNOSIS — Y92002 Bathroom of unspecified non-institutional (private) residence single-family (private) house as the place of occurrence of the external cause: Secondary | ICD-10-CM | POA: Diagnosis not present

## 2022-03-18 DIAGNOSIS — S62515A Nondisplaced fracture of proximal phalanx of left thumb, initial encounter for closed fracture: Secondary | ICD-10-CM | POA: Diagnosis not present

## 2022-03-18 DIAGNOSIS — S0003XA Contusion of scalp, initial encounter: Secondary | ICD-10-CM | POA: Diagnosis not present

## 2022-03-18 DIAGNOSIS — M542 Cervicalgia: Secondary | ICD-10-CM | POA: Diagnosis not present

## 2022-03-18 DIAGNOSIS — S62525A Nondisplaced fracture of distal phalanx of left thumb, initial encounter for closed fracture: Secondary | ICD-10-CM | POA: Diagnosis not present

## 2022-03-18 DIAGNOSIS — S6991XA Unspecified injury of right wrist, hand and finger(s), initial encounter: Secondary | ICD-10-CM | POA: Diagnosis present

## 2022-03-18 LAB — CBC WITH DIFFERENTIAL/PLATELET
Abs Immature Granulocytes: 0.05 10*3/uL (ref 0.00–0.07)
Basophils Absolute: 0.1 10*3/uL (ref 0.0–0.1)
Basophils Relative: 1 %
Eosinophils Absolute: 0.1 10*3/uL (ref 0.0–0.5)
Eosinophils Relative: 1 %
HCT: 41.4 % (ref 39.0–52.0)
Hemoglobin: 14.4 g/dL (ref 13.0–17.0)
Immature Granulocytes: 1 %
Lymphocytes Relative: 9 %
Lymphs Abs: 0.9 10*3/uL (ref 0.7–4.0)
MCH: 33.6 pg (ref 26.0–34.0)
MCHC: 34.8 g/dL (ref 30.0–36.0)
MCV: 96.7 fL (ref 80.0–100.0)
Monocytes Absolute: 0.7 10*3/uL (ref 0.1–1.0)
Monocytes Relative: 8 %
Neutro Abs: 7.6 10*3/uL (ref 1.7–7.7)
Neutrophils Relative %: 80 %
Platelets: 206 10*3/uL (ref 150–400)
RBC: 4.28 MIL/uL (ref 4.22–5.81)
RDW: 12 % (ref 11.5–15.5)
WBC: 9.3 10*3/uL (ref 4.0–10.5)
nRBC: 0 % (ref 0.0–0.2)

## 2022-03-18 LAB — BASIC METABOLIC PANEL
Anion gap: 3 — ABNORMAL LOW (ref 5–15)
BUN: 14 mg/dL (ref 8–23)
CO2: 34 mmol/L — ABNORMAL HIGH (ref 22–32)
Calcium: 8.9 mg/dL (ref 8.9–10.3)
Chloride: 103 mmol/L (ref 98–111)
Creatinine, Ser: 0.91 mg/dL (ref 0.61–1.24)
GFR, Estimated: >60 mL/min (ref >60)
Glucose, Bld: 102 mg/dL — ABNORMAL HIGH (ref 70–99)
Potassium: 3.9 mmol/L (ref 3.5–5.1)
Sodium: 140 mmol/L (ref 135–145)

## 2022-03-18 MED ORDER — CARBIDOPA-LEVODOPA 25-100 MG PO TABS
1.0000 | ORAL_TABLET | Freq: Three times a day (TID) | ORAL | Status: DC
  Administered 2022-03-18: 1 via ORAL
  Filled 2022-03-18: qty 1

## 2022-03-18 NOTE — ED Triage Notes (Signed)
Per family patient had an unwitnessed fall while in the bathroom. Pt reports that he tried to "get up a couple of times and on the third try he fell. Per family unsure if patient had his wheelchair in the bathroom with him. When they arrived EMS was already on scene. Pt lives in [facility].

## 2022-03-18 NOTE — ED Notes (Signed)
RN attempted to give report to [facility].

## 2022-03-18 NOTE — Discharge Instructions (Addendum)
You have a mild fracture in your right thumb.  Please keep splint on at all times but it is okay to remove for shower/bath.  Follow-up with Dr. Janee Morn with hand team.  Recommend using Tylenol and ice for pain as needed.  Your MRI of the brain did not show sign of a new stroke.

## 2022-03-18 NOTE — ED Provider Notes (Addendum)
[facility] EMERGENCY DEPARTMENT Provider Note   CSN: [id number] Arrival date & time: 03/18/22  2542     History  Chief Complaint  Patient presents with   Zachary [name] is a 71 y.o. male.  Patient with fall at skilled nursing facility.  History of Parkinson's and multiple falls.  Has laceration over the left side of his forehead.  He is not on blood thinners.  He is at his baseline.  Patient was using the bathroom and had his wheelchair with him.  Unwitnessed fall but sounds like he had just used the bathroom and had trouble transferring to his wheelchair.  Patient denies any weakness or numbness.  He is having some pain in his right hand.  No other extremity tenderness.  He has been able to transfer from his wheelchair since the fall.  Denies any neck pain, headache, vision changes, chest pain, shortness of breath.  Patient takes midodrine and fludrocortisone for these type episodes.  The history is provided by the patient.      Home Medications Prior to Admission medications   Medication Sig Start Date End Date Taking? Authorizing Provider  AMBULATORY NON FORMULARY MEDICATION Transport chair Dx: Parkinsons Disease G20 12/24/19   [name], [name], DO  carbidopa-levodopa (SINEMET CR) 50-200 MG tablet TAKE ONE TABLET BY MOUTH AT BEDTIME 01/03/22   [name], Rebecca S, DO  carbidopa-levodopa (SINEMET IR) 25-100 MG tablet 3 tabs AT 6:30AM/2 at 8:30AM/2 at 10:30AM/2 at 12:30PM/2 at 2:30PM/2 at 4:30pm/2 at 6:30pm. take up to extra tablet (in 1/2 tabs) per day AS NEEDED 09/05/21   [name], [name], DO  Cholecalciferol (DIALYVITE VITAMIN D 5000 PO) Take 1 tablet by mouth daily.    [provider]  entacapone (COMTAN) 200 MG tablet Take 1 tablet (200 mg total) by mouth 3 (three) times daily (8:30/12:30/4:30). 12/04/21   [name], [name], DO  finasteride (PROSCAR) 5 MG tablet Take 1 tablet by mouth daily. 12/29/21   [provider]  fludrocortisone (FLORINEF) 0.1 MG tablet  Take 2 tablets (0.2 mg total) by mouth 2 (two) times daily. Patient taking differently: Take 0.2 mg by mouth 2 (two) times daily. 3 pills 07/04/21   Marinus Maw, MD  Lactobacillus (PROBIOTIC ACIDOPHILUS PO) Take 1 tablet by mouth daily.    [provider]  midodrine (PROAMATINE) 5 MG tablet TAKE ONE TABLET BY MOUTH THREE TIMES A DAILY WITH MEALS 01/18/22   [name], [name], DO  Multiple Vitamins-Minerals (PRESERVISION AREDS 2 PO) Take 1 tablet by mouth daily.    [provider]  tamsulosin (FLOMAX) 0.4 MG CAPS capsule Take 1 capsule by mouth daily. 12/29/21   [provider]      Allergies    Pear and Tree extract    Review of Systems   Review of Systems  Physical Exam Updated Vital Signs BP (!) 145/82 (BP Location: Right Arm)   Pulse 61   Temp 97.6 F (36.4 C) (Oral)   Resp 16   Ht 5' 9.5" (1.765 m)   Wt 77.1 kg   SpO2 96%   BMI 24.74 kg/m  Physical Exam Vitals and nursing note reviewed.  Constitutional:      General: He is not in acute distress.    Appearance: He is well-developed.  HENT:     Nose: Nose normal.     Mouth/Throat:     Mouth: Mucous membranes are moist.  Eyes:     Extraocular Movements: Extraocular movements  intact.     Conjunctiva/sclera: Conjunctivae normal.     Pupils: Pupils are equal, round, and reactive to light.  Cardiovascular:     Rate and Rhythm: Normal rate and regular rhythm.     Heart sounds: No murmur heard. Pulmonary:     Effort: Pulmonary effort is normal. No respiratory distress.     Breath sounds: Normal breath sounds.  Abdominal:     Palpations: Abdomen is soft.     Tenderness: There is no abdominal tenderness.  Musculoskeletal:        General: Tenderness present. No swelling.     Cervical back: Neck supple.     Comments: Tenderness to the right hand  Skin:    General: Skin is warm and dry.     Capillary Refill: Capillary refill takes less than 2 seconds.     Comments: 4 cm superficial laceration over  the left side of the forehead  Neurological:     General: No focal deficit present.     Mental Status: He is alert and oriented to person, place, and time.     Cranial Nerves: No cranial nerve deficit.     Sensory: No sensory deficit.     Motor: No weakness.  Psychiatric:        Mood and Affect: Mood normal.    ED Results / Procedures / Treatments   Labs (all labs ordered are listed, but only abnormal results are displayed) Labs Reviewed  CBC WITH DIFFERENTIAL/PLATELET  BASIC METABOLIC PANEL    EKG None  Radiology CT Head Wo Contrast  Result Date: 03/18/2022 CLINICAL DATA:  71 year old male with history of unwitnessed fall. Head and neck pain. EXAM: CT HEAD WITHOUT CONTRAST CT CERVICAL SPINE WITHOUT CONTRAST TECHNIQUE: Multidetector CT imaging of the head and cervical spine was performed following the standard protocol without intravenous contrast. Multiplanar CT image reconstructions of the cervical spine were also generated. RADIATION DOSE REDUCTION: This exam was performed according to the departmental dose-optimization program which includes automated exposure control, adjustment of the mA and/or kV according to patient size and/or use of iterative reconstruction technique. COMPARISON:  Head and cervical spine CT 01/30/2022. FINDINGS: CT HEAD FINDINGS Brain: Moderate cerebral and mild cerebellar atrophy. Patchy and confluent areas of decreased attenuation are noted throughout the deep and periventricular white matter of the cerebral hemispheres bilaterally, compatible with chronic microvascular ischemic disease. On axial image 15 of series 2 there are low-attenuation lesions bilaterally. On the right, this is involving the posterior aspect of the right external capsule, and is relatively well-defined, suggesting an old lacunar infarct. On the left, this is in the posterior aspect of the left internal capsule and is slightly less well-defined, concerning for an area of age-indeterminate  ischemia. No evidence of acute infarction, hemorrhage, hydrocephalus, extra-axial collection or mass lesion/mass effect. Vascular: No hyperdense vessel or unexpected calcification. Skull: Normal. Negative for fracture or focal lesion. Sinuses/Orbits: Small amount of mucosal thickening in the left maxillary sinus. No hemosinus. Other: Small amount of high attenuation soft tissue swelling in the left frontal scalp, likely a small scalp hematoma. CT CERVICAL SPINE FINDINGS Alignment: Mild reversal of normal cervical lordosis centered at the level of C4-C5, likely chronic and degenerative. Alignment is otherwise anatomic. Skull base and vertebrae: No acute fracture. No primary bone lesion or focal pathologic process. Soft tissues and spinal canal: No prevertebral fluid or swelling. No visible canal hematoma. Disc levels: Multilevel degenerative disc disease, most pronounced at C4-C5 and C5-C6. Mild multilevel facet arthropathy. Upper  chest: Unremarkable. Other: There are no aggressive appearing lytic or blastic lesions noted in the visualized portions of the skeleton. IMPRESSION: 1. Small left frontal scalp hematoma. No evidence of significant acute traumatic injury to the skull, brain or cervical spine. 2. Moderate cerebral and mild cerebellar atrophy with chronic microvascular ischemic changes in the cerebral white matter, as above. In addition, there is an area of potential age-indeterminate ischemia involving the posterior aspect of the left internal capsule. If there is clinical concern for acute infarct, further evaluation with brain MRI without contrast would be recommended at this time. 3. Multilevel degenerative disc disease and cervical spondylosis, as above. Critical Value/emergent results were called by telephone at the time of interpretation on 03/18/2022 at 10:47 am to provider Dr. Virgina Norfolk, who verbally acknowledged these results. Electronically Signed   By: Trudie Reed M.D.   On: 03/18/2022  10:47   CT Cervical Spine Wo Contrast  Result Date: 03/18/2022 CLINICAL DATA:  71 year old male with history of unwitnessed fall. Head and neck pain. EXAM: CT HEAD WITHOUT CONTRAST CT CERVICAL SPINE WITHOUT CONTRAST TECHNIQUE: Multidetector CT imaging of the head and cervical spine was performed following the standard protocol without intravenous contrast. Multiplanar CT image reconstructions of the cervical spine were also generated. RADIATION DOSE REDUCTION: This exam was performed according to the departmental dose-optimization program which includes automated exposure control, adjustment of the mA and/or kV according to patient size and/or use of iterative reconstruction technique. COMPARISON:  Head and cervical spine CT 01/30/2022. FINDINGS: CT HEAD FINDINGS Brain: Moderate cerebral and mild cerebellar atrophy. Patchy and confluent areas of decreased attenuation are noted throughout the deep and periventricular white matter of the cerebral hemispheres bilaterally, compatible with chronic microvascular ischemic disease. On axial image 15 of series 2 there are low-attenuation lesions bilaterally. On the right, this is involving the posterior aspect of the right external capsule, and is relatively well-defined, suggesting an old lacunar infarct. On the left, this is in the posterior aspect of the left internal capsule and is slightly less well-defined, concerning for an area of age-indeterminate ischemia. No evidence of acute infarction, hemorrhage, hydrocephalus, extra-axial collection or mass lesion/mass effect. Vascular: No hyperdense vessel or unexpected calcification. Skull: Normal. Negative for fracture or focal lesion. Sinuses/Orbits: Small amount of mucosal thickening in the left maxillary sinus. No hemosinus. Other: Small amount of high attenuation soft tissue swelling in the left frontal scalp, likely a small scalp hematoma. CT CERVICAL SPINE FINDINGS Alignment: Mild reversal of normal cervical  lordosis centered at the level of C4-C5, likely chronic and degenerative. Alignment is otherwise anatomic. Skull base and vertebrae: No acute fracture. No primary bone lesion or focal pathologic process. Soft tissues and spinal canal: No prevertebral fluid or swelling. No visible canal hematoma. Disc levels: Multilevel degenerative disc disease, most pronounced at C4-C5 and C5-C6. Mild multilevel facet arthropathy. Upper chest: Unremarkable. Other: There are no aggressive appearing lytic or blastic lesions noted in the visualized portions of the skeleton. IMPRESSION: 1. Small left frontal scalp hematoma. No evidence of significant acute traumatic injury to the skull, brain or cervical spine. 2. Moderate cerebral and mild cerebellar atrophy with chronic microvascular ischemic changes in the cerebral white matter, as above. In addition, there is an area of potential age-indeterminate ischemia involving the posterior aspect of the left internal capsule. If there is clinical concern for acute infarct, further evaluation with brain MRI without contrast would be recommended at this time. 3. Multilevel degenerative disc disease and cervical spondylosis, as above.  Critical Value/emergent results were called by telephone at the time of interpretation on 03/18/2022 at 10:47 am to provider Dr. Virgina Norfolk, who verbally acknowledged these results. Electronically Signed   By: Trudie Reed M.D.   On: 03/18/2022 10:47   DG Hand Complete Right  Result Date: 03/18/2022 CLINICAL DATA:  Trauma, fall EXAM: RIGHT HAND - COMPLETE 3+ VIEW COMPARISON:  None Available. FINDINGS: There is tiny avulsion fracture in the base of proximal phalanx of right thumb. Rest of the visualized bony structures appear intact. IMPRESSION: Avulsion fracture in the base of proximal phalanx of right thumb may be recent or old. Electronically Signed   By: Ernie Avena M.D.   On: 03/18/2022 10:29    Procedures .Marland KitchenLaceration Repair  Date/Time:  03/18/2022 10:00 AM Performed by: Virgina Norfolk, DO Authorized by: Virgina Norfolk, DO   Consent:    Consent obtained:  Verbal   Consent given by:  Patient   Risks, benefits, and alternatives were discussed: yes     Risks discussed:  Infection, need for additional repair, nerve damage, pain, poor cosmetic result, poor wound healing, retained foreign body, tendon damage and vascular damage   Alternatives discussed:  No treatment Universal protocol:    Procedure explained and questions answered to patient or proxy's satisfaction: yes     Patient identity confirmed:  Verbally with patient Anesthesia:    Anesthesia method:  None Laceration details:    Location: Left forehead.   Length (cm):  4   Depth (mm):  1 Pre-procedure details:    Preparation:  Patient was prepped and draped in usual sterile fashion and imaging obtained to evaluate for foreign bodies Exploration:    Limited defect created (wound extended): no     Imaging outcome: foreign body not noted     Wound exploration: wound explored through full range of motion     Wound extent: no areolar tissue violation noted, no fascia violation noted, no foreign bodies/material noted, no muscle damage noted, no nerve damage noted, no tendon damage noted, no underlying fracture noted and no vascular damage noted     Contaminated: no   Treatment:    Area cleansed with:  Shur-Clens   Amount of cleaning:  Standard   Visualized foreign bodies/material removed: no     Undermining:  None Skin repair:    Repair method:  Tissue adhesive and Steri-Strips   Number of Steri-Strips:  3 Approximation:    Approximation:  Close Repair type:    Repair type:  Simple Post-procedure details:    Dressing:  Open (no dressing)   Procedure completion:  Tolerated    Medications Ordered in ED Medications - No data to display  ED Course/ Medical Decision Making/ A&P                           Medical Decision Making Amount and/or Complexity of Data  Reviewed Labs: ordered. Radiology: ordered.   HENDRICK PAVICH is here after fall with left-sided facial laceration.  Normal vitals. Fall after getting up from the toilet bowl.  No fever.  History of the same.  History of Parkinson's on Florinef and midodrine for hypotensive episodes.  Complaining of some right hand pain but otherwise no headache or neck pain.  Denies any weakness or numbness.  He is at his baseline.  No recent illnesses.  Overall he appears well.  I am not worried about any organic medical problem at this time.  We will  get a CT scan of his head and neck and x-ray of his right hand.  Laceration was repaired with Dermabond.  Tetanus shot is already up-to-date.  Per my review and interpretation of x-ray there is what appears may be to be some sort of avulsion fracture.  Radiology report states avulsion fracture at the base of the proximal phalanx in the right thumb.  Given that pain started after fall today suspect that this is acute.  Will place in a removable thumb splint.  We will have him follow-up with hand team.    Radiology called me on the phone from a traumatic standpoint CT scan of the head and neck are unremarkable.  However there is a potentially age-indeterminate ischemic process going on the left internal capsule.  CT scan last month did not show this.  I talked on the phone with neurology with Dr. Amada Jupiter as well who is recommending MRI to further elucidate if this is old or new.  He states that if MRI does not show any acute stroke and follow-up with neurology outpatient.  If MRI does show a stroke consult neurology.  Dr. Rodena Medin aware of transfer to [facility] Will obtain basic labs.  This chart was dictated using voice recognition software.  Despite best efforts to proofread,  errors can occur which can change the documentation meaning.      Final Clinical Impression(s) / ED Diagnoses Final diagnoses:  Fall, initial encounter  Laceration of forehead, initial  encounter  Closed nondisplaced fracture of phalanx of left thumb, unspecified phalanx, initial encounter    Rx / DC Orders ED Discharge Orders     None         Virgina Norfolk, DO 03/18/22 1039    Virgina Norfolk, DO 03/18/22 1103    Ayris Carano, DO 03/18/22 1106    Jermany Sundell, DO 03/18/22 1113

## 2022-03-18 NOTE — ED Provider Notes (Signed)
71 yo male [place] a transfer [place] with concern for a fall today, age-indeterminated infarct on CT, here pending MRI brain for CVA evaluation/completion.  If negative anticipate discharge home.  Pt has parkinson's disease and possibly some cognitive deficitis.  MRI brain personally reviewed and interpreted showing no acute infarct.  Patient received his expected Sinemet.  Stable for discharge.  His wife will take him   Terald Sleeper, MD 03/18/22 [phone]

## 2022-03-19 DIAGNOSIS — G20 Parkinson's disease: Secondary | ICD-10-CM | POA: Diagnosis not present

## 2022-03-19 DIAGNOSIS — R490 Dysphonia: Secondary | ICD-10-CM | POA: Diagnosis not present

## 2022-03-19 DIAGNOSIS — R41841 Cognitive communication deficit: Secondary | ICD-10-CM | POA: Diagnosis not present

## 2022-03-19 DIAGNOSIS — R2689 Other abnormalities of gait and mobility: Secondary | ICD-10-CM | POA: Diagnosis not present

## 2022-03-19 DIAGNOSIS — R488 Other symbolic dysfunctions: Secondary | ICD-10-CM | POA: Diagnosis not present

## 2022-03-19 DIAGNOSIS — M6281 Muscle weakness (generalized): Secondary | ICD-10-CM | POA: Diagnosis not present

## 2022-03-20 DIAGNOSIS — R296 Repeated falls: Secondary | ICD-10-CM | POA: Diagnosis not present

## 2022-03-20 DIAGNOSIS — R488 Other symbolic dysfunctions: Secondary | ICD-10-CM | POA: Diagnosis not present

## 2022-03-20 DIAGNOSIS — R41841 Cognitive communication deficit: Secondary | ICD-10-CM | POA: Diagnosis not present

## 2022-03-20 DIAGNOSIS — G20 Parkinson's disease: Secondary | ICD-10-CM | POA: Diagnosis not present

## 2022-03-20 DIAGNOSIS — M6281 Muscle weakness (generalized): Secondary | ICD-10-CM | POA: Diagnosis not present

## 2022-03-20 DIAGNOSIS — N3946 Mixed incontinence: Secondary | ICD-10-CM | POA: Diagnosis not present

## 2022-03-20 DIAGNOSIS — R490 Dysphonia: Secondary | ICD-10-CM | POA: Diagnosis not present

## 2022-03-21 DIAGNOSIS — M6281 Muscle weakness (generalized): Secondary | ICD-10-CM | POA: Diagnosis not present

## 2022-03-21 DIAGNOSIS — N3946 Mixed incontinence: Secondary | ICD-10-CM | POA: Diagnosis not present

## 2022-03-21 DIAGNOSIS — R490 Dysphonia: Secondary | ICD-10-CM | POA: Diagnosis not present

## 2022-03-21 DIAGNOSIS — R488 Other symbolic dysfunctions: Secondary | ICD-10-CM | POA: Diagnosis not present

## 2022-03-21 DIAGNOSIS — G20 Parkinson's disease: Secondary | ICD-10-CM | POA: Diagnosis not present

## 2022-03-21 DIAGNOSIS — R41841 Cognitive communication deficit: Secondary | ICD-10-CM | POA: Diagnosis not present

## 2022-03-21 DIAGNOSIS — R2689 Other abnormalities of gait and mobility: Secondary | ICD-10-CM | POA: Diagnosis not present

## 2022-03-21 DIAGNOSIS — R296 Repeated falls: Secondary | ICD-10-CM | POA: Diagnosis not present

## 2022-03-22 DIAGNOSIS — M6281 Muscle weakness (generalized): Secondary | ICD-10-CM | POA: Diagnosis not present

## 2022-03-22 DIAGNOSIS — R490 Dysphonia: Secondary | ICD-10-CM | POA: Diagnosis not present

## 2022-03-22 DIAGNOSIS — R41841 Cognitive communication deficit: Secondary | ICD-10-CM | POA: Diagnosis not present

## 2022-03-22 DIAGNOSIS — N3946 Mixed incontinence: Secondary | ICD-10-CM | POA: Diagnosis not present

## 2022-03-22 DIAGNOSIS — G20 Parkinson's disease: Secondary | ICD-10-CM | POA: Diagnosis not present

## 2022-03-22 DIAGNOSIS — R488 Other symbolic dysfunctions: Secondary | ICD-10-CM | POA: Diagnosis not present

## 2022-03-22 DIAGNOSIS — R296 Repeated falls: Secondary | ICD-10-CM | POA: Diagnosis not present

## 2022-03-23 DIAGNOSIS — M7021 Olecranon bursitis, right elbow: Secondary | ICD-10-CM | POA: Diagnosis not present

## 2022-03-23 DIAGNOSIS — G20 Parkinson's disease: Secondary | ICD-10-CM | POA: Diagnosis not present

## 2022-03-23 DIAGNOSIS — N3946 Mixed incontinence: Secondary | ICD-10-CM | POA: Diagnosis not present

## 2022-03-23 DIAGNOSIS — S62514A Nondisplaced fracture of proximal phalanx of right thumb, initial encounter for closed fracture: Secondary | ICD-10-CM | POA: Diagnosis not present

## 2022-03-23 DIAGNOSIS — R296 Repeated falls: Secondary | ICD-10-CM | POA: Diagnosis not present

## 2022-03-23 DIAGNOSIS — R2689 Other abnormalities of gait and mobility: Secondary | ICD-10-CM | POA: Diagnosis not present

## 2022-03-23 DIAGNOSIS — M6281 Muscle weakness (generalized): Secondary | ICD-10-CM | POA: Diagnosis not present

## 2022-03-26 DIAGNOSIS — R41841 Cognitive communication deficit: Secondary | ICD-10-CM | POA: Diagnosis not present

## 2022-03-26 DIAGNOSIS — R490 Dysphonia: Secondary | ICD-10-CM | POA: Diagnosis not present

## 2022-03-26 DIAGNOSIS — R488 Other symbolic dysfunctions: Secondary | ICD-10-CM | POA: Diagnosis not present

## 2022-03-27 DIAGNOSIS — M6281 Muscle weakness (generalized): Secondary | ICD-10-CM | POA: Diagnosis not present

## 2022-03-27 DIAGNOSIS — R296 Repeated falls: Secondary | ICD-10-CM | POA: Diagnosis not present

## 2022-03-27 DIAGNOSIS — R488 Other symbolic dysfunctions: Secondary | ICD-10-CM | POA: Diagnosis not present

## 2022-03-27 DIAGNOSIS — R41841 Cognitive communication deficit: Secondary | ICD-10-CM | POA: Diagnosis not present

## 2022-03-27 DIAGNOSIS — R490 Dysphonia: Secondary | ICD-10-CM | POA: Diagnosis not present

## 2022-03-27 DIAGNOSIS — N3946 Mixed incontinence: Secondary | ICD-10-CM | POA: Diagnosis not present

## 2022-03-27 DIAGNOSIS — R2689 Other abnormalities of gait and mobility: Secondary | ICD-10-CM | POA: Diagnosis not present

## 2022-03-27 DIAGNOSIS — G20 Parkinson's disease: Secondary | ICD-10-CM | POA: Diagnosis not present

## 2022-03-28 DIAGNOSIS — G20 Parkinson's disease: Secondary | ICD-10-CM | POA: Diagnosis not present

## 2022-03-28 DIAGNOSIS — R41841 Cognitive communication deficit: Secondary | ICD-10-CM | POA: Diagnosis not present

## 2022-03-28 DIAGNOSIS — R490 Dysphonia: Secondary | ICD-10-CM | POA: Diagnosis not present

## 2022-03-28 DIAGNOSIS — M6281 Muscle weakness (generalized): Secondary | ICD-10-CM | POA: Diagnosis not present

## 2022-03-28 DIAGNOSIS — R488 Other symbolic dysfunctions: Secondary | ICD-10-CM | POA: Diagnosis not present

## 2022-03-28 DIAGNOSIS — R2689 Other abnormalities of gait and mobility: Secondary | ICD-10-CM | POA: Diagnosis not present

## 2022-03-28 DIAGNOSIS — R296 Repeated falls: Secondary | ICD-10-CM | POA: Diagnosis not present

## 2022-03-28 DIAGNOSIS — N3946 Mixed incontinence: Secondary | ICD-10-CM | POA: Diagnosis not present

## 2022-03-29 DIAGNOSIS — G20 Parkinson's disease: Secondary | ICD-10-CM | POA: Diagnosis not present

## 2022-03-29 DIAGNOSIS — R296 Repeated falls: Secondary | ICD-10-CM | POA: Diagnosis not present

## 2022-03-29 DIAGNOSIS — N3946 Mixed incontinence: Secondary | ICD-10-CM | POA: Diagnosis not present

## 2022-03-29 DIAGNOSIS — M6281 Muscle weakness (generalized): Secondary | ICD-10-CM | POA: Diagnosis not present

## 2022-03-30 ENCOUNTER — Telehealth: Payer: Self-pay | Admitting: Neurology

## 2022-03-30 DIAGNOSIS — M6281 Muscle weakness (generalized): Secondary | ICD-10-CM | POA: Diagnosis not present

## 2022-03-30 DIAGNOSIS — R296 Repeated falls: Secondary | ICD-10-CM | POA: Diagnosis not present

## 2022-03-30 DIAGNOSIS — G20 Parkinson's disease: Secondary | ICD-10-CM | POA: Diagnosis not present

## 2022-03-30 DIAGNOSIS — N3946 Mixed incontinence: Secondary | ICD-10-CM | POA: Diagnosis not present

## 2022-03-30 DIAGNOSIS — R2689 Other abnormalities of gait and mobility: Secondary | ICD-10-CM | POA: Diagnosis not present

## 2022-03-30 DIAGNOSIS — R4689 Other symptoms and signs involving appearance and behavior: Secondary | ICD-10-CM

## 2022-03-30 NOTE — Telephone Encounter (Signed)
Get manic and falling a lot, asking about a medication for this she stated she has given him a half a pill before for the manic and she can't remember the name if it. Having trouble with his kids they do not think he is getting the best care where he is. They want to know what the scan means where it says NPH? And fluid on brain? Asking for understanding? What should they be looking out for? What does this mean for a pt? They are trying to move him to memory care but pt wife wants him to see Dr [name] before they make that move.  Can he been seen sooner? And can kids come because they have questions as well. She stated she is in a battle that she isn't winning anymore.

## 2022-03-30 NOTE — Telephone Encounter (Signed)
Pt wife called an informed what Dr [name] had stated that they discussed this scan in detail (I have shown them pics.  A lumbar puncture was offered and even scheduled but they declined it when it was only done early in the morning and they couldn't get him there that early).  Wife stated that she called back and asked about doing the LP and was told that he did not need it done at this time. She would like to know why he doesn't need it now? She is going to talk to his kids and see if they would like to talk with [facility]. She said he is getting great care where he is its just his kids. She again asked about a medication to help when he is manic?

## 2022-03-30 NOTE — Telephone Encounter (Signed)
Pt's wife called in stating the patient has really deteriorated in the last few weeks. His blood pressure is super low. He has been falling and had to go to the ED due to a fall. She would like to speak with someone to see what should be done from here.

## 2022-03-30 NOTE — Telephone Encounter (Signed)
Spoke with pt wife informed her that we can order the LP if she would like it would be done at the hospital, she stated no ill hold off for right now, and see what the kids say. She was also advised that for his behaviors we will send in an order to psychiatry

## 2022-04-02 DIAGNOSIS — M6281 Muscle weakness (generalized): Secondary | ICD-10-CM | POA: Diagnosis not present

## 2022-04-02 DIAGNOSIS — R2689 Other abnormalities of gait and mobility: Secondary | ICD-10-CM | POA: Diagnosis not present

## 2022-04-02 DIAGNOSIS — G20 Parkinson's disease: Secondary | ICD-10-CM | POA: Diagnosis not present

## 2022-04-02 DIAGNOSIS — R296 Repeated falls: Secondary | ICD-10-CM | POA: Diagnosis not present

## 2022-04-02 DIAGNOSIS — N3946 Mixed incontinence: Secondary | ICD-10-CM | POA: Diagnosis not present

## 2022-04-03 DIAGNOSIS — R41841 Cognitive communication deficit: Secondary | ICD-10-CM | POA: Diagnosis not present

## 2022-04-03 DIAGNOSIS — R488 Other symbolic dysfunctions: Secondary | ICD-10-CM | POA: Diagnosis not present

## 2022-04-03 DIAGNOSIS — R490 Dysphonia: Secondary | ICD-10-CM | POA: Diagnosis not present

## 2022-04-03 DIAGNOSIS — G20 Parkinson's disease: Secondary | ICD-10-CM | POA: Diagnosis not present

## 2022-04-03 DIAGNOSIS — R296 Repeated falls: Secondary | ICD-10-CM | POA: Diagnosis not present

## 2022-04-03 DIAGNOSIS — N3946 Mixed incontinence: Secondary | ICD-10-CM | POA: Diagnosis not present

## 2022-04-03 DIAGNOSIS — M6281 Muscle weakness (generalized): Secondary | ICD-10-CM | POA: Diagnosis not present

## 2022-04-04 DIAGNOSIS — N3946 Mixed incontinence: Secondary | ICD-10-CM | POA: Diagnosis not present

## 2022-04-04 DIAGNOSIS — M6281 Muscle weakness (generalized): Secondary | ICD-10-CM | POA: Diagnosis not present

## 2022-04-04 DIAGNOSIS — R41841 Cognitive communication deficit: Secondary | ICD-10-CM | POA: Diagnosis not present

## 2022-04-04 DIAGNOSIS — G20 Parkinson's disease: Secondary | ICD-10-CM | POA: Diagnosis not present

## 2022-04-04 DIAGNOSIS — R490 Dysphonia: Secondary | ICD-10-CM | POA: Diagnosis not present

## 2022-04-04 DIAGNOSIS — R2689 Other abnormalities of gait and mobility: Secondary | ICD-10-CM | POA: Diagnosis not present

## 2022-04-04 DIAGNOSIS — R296 Repeated falls: Secondary | ICD-10-CM | POA: Diagnosis not present

## 2022-04-04 DIAGNOSIS — R488 Other symbolic dysfunctions: Secondary | ICD-10-CM | POA: Diagnosis not present

## 2022-04-05 DIAGNOSIS — R488 Other symbolic dysfunctions: Secondary | ICD-10-CM | POA: Diagnosis not present

## 2022-04-05 DIAGNOSIS — R490 Dysphonia: Secondary | ICD-10-CM | POA: Diagnosis not present

## 2022-04-05 DIAGNOSIS — R296 Repeated falls: Secondary | ICD-10-CM | POA: Diagnosis not present

## 2022-04-05 DIAGNOSIS — N3946 Mixed incontinence: Secondary | ICD-10-CM | POA: Diagnosis not present

## 2022-04-05 DIAGNOSIS — M6281 Muscle weakness (generalized): Secondary | ICD-10-CM | POA: Diagnosis not present

## 2022-04-05 DIAGNOSIS — R41841 Cognitive communication deficit: Secondary | ICD-10-CM | POA: Diagnosis not present

## 2022-04-05 DIAGNOSIS — G20 Parkinson's disease: Secondary | ICD-10-CM | POA: Diagnosis not present

## 2022-04-06 ENCOUNTER — Telehealth: Payer: Self-pay | Admitting: Licensed Clinical Social Worker

## 2022-04-06 DIAGNOSIS — M6281 Muscle weakness (generalized): Secondary | ICD-10-CM | POA: Diagnosis not present

## 2022-04-06 DIAGNOSIS — R41841 Cognitive communication deficit: Secondary | ICD-10-CM | POA: Diagnosis not present

## 2022-04-06 DIAGNOSIS — N3946 Mixed incontinence: Secondary | ICD-10-CM | POA: Diagnosis not present

## 2022-04-06 DIAGNOSIS — R2689 Other abnormalities of gait and mobility: Secondary | ICD-10-CM | POA: Diagnosis not present

## 2022-04-06 DIAGNOSIS — G20 Parkinson's disease: Secondary | ICD-10-CM | POA: Diagnosis not present

## 2022-04-06 DIAGNOSIS — R490 Dysphonia: Secondary | ICD-10-CM | POA: Diagnosis not present

## 2022-04-06 DIAGNOSIS — R488 Other symbolic dysfunctions: Secondary | ICD-10-CM | POA: Diagnosis not present

## 2022-04-06 DIAGNOSIS — R296 Repeated falls: Secondary | ICD-10-CM | POA: Diagnosis not present

## 2022-04-06 NOTE — Telephone Encounter (Signed)
LCSW met with Pt's wife, Pt's son in office.  In addition, Pt's daughter was by phone.  They scheduled an appointment with me to discuss care and changes they have noted.  In meeting with me, it was more medical clarification , in becoming more knowledgeable of what was NPH, how this relates or could effect his Parkinson's and would a LP relieve symptoms or is diagnoses only. They appeared fine at his place of establishment and did not verbalize any outward issues about any care concerns.There questions were related if they should get the Lumbar Puncture and asking about risk and benefits , and the doctor's recommendations .  They verbalized that he has had a "drastic change " since March time with his Parkinson's with more falls, incontinence, gait, being able to communicate, more short term forgetfulness, some behaviors but did not mentioned any volatile, or agitation. They are looking at doing possible the LP for [facility], but before they do wanting to know if it would be helpful at this point and had two questions they wanted answered  which are :  1) Would another brain scan be helpful in assessing the progression of NPH? Comparing this to the Jan/Feb brain scan? 2) Has she seen the MRI from mid-May? Would that be helpful in seeing potential progression of NPH?  I told them I would pass these questions along to help them in their decision making procress .

## 2022-04-07 DIAGNOSIS — R41841 Cognitive communication deficit: Secondary | ICD-10-CM | POA: Diagnosis not present

## 2022-04-07 DIAGNOSIS — R488 Other symbolic dysfunctions: Secondary | ICD-10-CM | POA: Diagnosis not present

## 2022-04-07 DIAGNOSIS — R490 Dysphonia: Secondary | ICD-10-CM | POA: Diagnosis not present

## 2022-04-09 ENCOUNTER — Telehealth: Payer: Self-pay | Admitting: Neurology

## 2022-04-09 DIAGNOSIS — R488 Other symbolic dysfunctions: Secondary | ICD-10-CM | POA: Diagnosis not present

## 2022-04-09 DIAGNOSIS — R490 Dysphonia: Secondary | ICD-10-CM | POA: Diagnosis not present

## 2022-04-09 DIAGNOSIS — M6281 Muscle weakness (generalized): Secondary | ICD-10-CM | POA: Diagnosis not present

## 2022-04-09 DIAGNOSIS — R41841 Cognitive communication deficit: Secondary | ICD-10-CM | POA: Diagnosis not present

## 2022-04-09 DIAGNOSIS — R2689 Other abnormalities of gait and mobility: Secondary | ICD-10-CM | POA: Diagnosis not present

## 2022-04-09 DIAGNOSIS — G20 Parkinson's disease: Secondary | ICD-10-CM | POA: Diagnosis not present

## 2022-04-09 DIAGNOSIS — R296 Repeated falls: Secondary | ICD-10-CM | POA: Diagnosis not present

## 2022-04-09 DIAGNOSIS — N3946 Mixed incontinence: Secondary | ICD-10-CM | POA: Diagnosis not present

## 2022-04-09 NOTE — Telephone Encounter (Signed)
Patient's wife [name] returned a call to [facility] about a lumbar puncture.

## 2022-04-09 NOTE — Telephone Encounter (Signed)
Spoke to husbands wife [name] and explained that we will be happy to schedule to lumbar puncture. I also discussed with patrients wife that we will not be calling the children to explain any past appointments or teswting. If they

## 2022-04-09 NOTE — Telephone Encounter (Signed)
Called patients wife to ask about doing the lumbar puncture. Left message

## 2022-04-10 DIAGNOSIS — G20 Parkinson's disease: Secondary | ICD-10-CM | POA: Diagnosis not present

## 2022-04-10 DIAGNOSIS — N3946 Mixed incontinence: Secondary | ICD-10-CM | POA: Diagnosis not present

## 2022-04-10 DIAGNOSIS — R296 Repeated falls: Secondary | ICD-10-CM | POA: Diagnosis not present

## 2022-04-10 DIAGNOSIS — M6281 Muscle weakness (generalized): Secondary | ICD-10-CM | POA: Diagnosis not present

## 2022-04-10 NOTE — Telephone Encounter (Signed)
Spoke to patients wife at length about doing the appointment for the lumbar puncture and she is asking the patient. II also discussed with the patients wife about the MRI and other medical questions ands that Dr. Arbutus Leas will meet with them at appointments for any questions they may have

## 2022-04-11 DIAGNOSIS — R490 Dysphonia: Secondary | ICD-10-CM | POA: Diagnosis not present

## 2022-04-11 DIAGNOSIS — R488 Other symbolic dysfunctions: Secondary | ICD-10-CM | POA: Diagnosis not present

## 2022-04-11 DIAGNOSIS — R41841 Cognitive communication deficit: Secondary | ICD-10-CM | POA: Diagnosis not present

## 2022-04-11 DIAGNOSIS — R2689 Other abnormalities of gait and mobility: Secondary | ICD-10-CM | POA: Diagnosis not present

## 2022-04-11 DIAGNOSIS — N3946 Mixed incontinence: Secondary | ICD-10-CM | POA: Diagnosis not present

## 2022-04-11 DIAGNOSIS — R296 Repeated falls: Secondary | ICD-10-CM | POA: Diagnosis not present

## 2022-04-11 DIAGNOSIS — M6281 Muscle weakness (generalized): Secondary | ICD-10-CM | POA: Diagnosis not present

## 2022-04-11 DIAGNOSIS — G20 Parkinson's disease: Secondary | ICD-10-CM | POA: Diagnosis not present

## 2022-04-12 DIAGNOSIS — R488 Other symbolic dysfunctions: Secondary | ICD-10-CM | POA: Diagnosis not present

## 2022-04-12 DIAGNOSIS — G20 Parkinson's disease: Secondary | ICD-10-CM | POA: Diagnosis not present

## 2022-04-12 DIAGNOSIS — R296 Repeated falls: Secondary | ICD-10-CM | POA: Diagnosis not present

## 2022-04-12 DIAGNOSIS — N3946 Mixed incontinence: Secondary | ICD-10-CM | POA: Diagnosis not present

## 2022-04-12 DIAGNOSIS — M6281 Muscle weakness (generalized): Secondary | ICD-10-CM | POA: Diagnosis not present

## 2022-04-12 DIAGNOSIS — R490 Dysphonia: Secondary | ICD-10-CM | POA: Diagnosis not present

## 2022-04-12 DIAGNOSIS — R41841 Cognitive communication deficit: Secondary | ICD-10-CM | POA: Diagnosis not present

## 2022-04-13 ENCOUNTER — Telehealth: Payer: Self-pay | Admitting: Neurology

## 2022-04-13 DIAGNOSIS — R41841 Cognitive communication deficit: Secondary | ICD-10-CM | POA: Diagnosis not present

## 2022-04-13 DIAGNOSIS — R490 Dysphonia: Secondary | ICD-10-CM | POA: Diagnosis not present

## 2022-04-13 DIAGNOSIS — G20 Parkinson's disease: Secondary | ICD-10-CM | POA: Diagnosis not present

## 2022-04-13 DIAGNOSIS — R488 Other symbolic dysfunctions: Secondary | ICD-10-CM | POA: Diagnosis not present

## 2022-04-13 DIAGNOSIS — R2689 Other abnormalities of gait and mobility: Secondary | ICD-10-CM | POA: Diagnosis not present

## 2022-04-13 DIAGNOSIS — M6281 Muscle weakness (generalized): Secondary | ICD-10-CM | POA: Diagnosis not present

## 2022-04-13 DIAGNOSIS — N3946 Mixed incontinence: Secondary | ICD-10-CM | POA: Diagnosis not present

## 2022-04-13 DIAGNOSIS — R296 Repeated falls: Secondary | ICD-10-CM | POA: Diagnosis not present

## 2022-04-13 NOTE — Telephone Encounter (Signed)
Patients wife called to speak with chelsea. She would like to know more about the lumbar puncture, how the healing would be and so forth. The family thinks she is messing with his quality of life. She needs some guidance.

## 2022-04-13 NOTE — Telephone Encounter (Signed)
I called patients wife. Answered questions re:  LP.  Discussed with wife that we have had multiple phone calls from both wife and children and son came into the office today with more questions (no appt scheduled).  Discussed with her that we won't be able to handle this many people coming/calling when the questions had previously been discussed at appts or with previous members of family.  Just today, wife called and son came here.   There has been family stressors and while I feel for that, discussed with the wife that we cannot be the family counselor.  She agreed that much of this was set off by family strife.  Pts  children don't think she is doing the right thing for patient.  She didn't know that the son had patient come up here today and sign DPR for him (son) to be on release.  Pt has dementia so not sure that this DPR is legal.  Pts wife had previously asked family not to be calling here.  Wife and I agreed that next visit it would just be her (as it has always been) and she would pass information to the family so that the visit could be productive.  She was very appreciative for the call and apologetic for all of the calls.  Told her there was no need for that - we just want to focus on the patient and take the best care of him that we can.

## 2022-04-13 NOTE — Telephone Encounter (Signed)
Pt son came in to the office and has questions about the scan that Dr [name] ordered please call

## 2022-04-14 DIAGNOSIS — R296 Repeated falls: Secondary | ICD-10-CM | POA: Diagnosis not present

## 2022-04-14 DIAGNOSIS — M6281 Muscle weakness (generalized): Secondary | ICD-10-CM | POA: Diagnosis not present

## 2022-04-14 DIAGNOSIS — N3946 Mixed incontinence: Secondary | ICD-10-CM | POA: Diagnosis not present

## 2022-04-14 DIAGNOSIS — G20 Parkinson's disease: Secondary | ICD-10-CM | POA: Diagnosis not present

## 2022-04-16 DIAGNOSIS — R488 Other symbolic dysfunctions: Secondary | ICD-10-CM | POA: Diagnosis not present

## 2022-04-16 DIAGNOSIS — R2689 Other abnormalities of gait and mobility: Secondary | ICD-10-CM | POA: Diagnosis not present

## 2022-04-16 DIAGNOSIS — N3946 Mixed incontinence: Secondary | ICD-10-CM | POA: Diagnosis not present

## 2022-04-16 DIAGNOSIS — R296 Repeated falls: Secondary | ICD-10-CM | POA: Diagnosis not present

## 2022-04-16 DIAGNOSIS — M7021 Olecranon bursitis, right elbow: Secondary | ICD-10-CM | POA: Diagnosis not present

## 2022-04-16 DIAGNOSIS — M6281 Muscle weakness (generalized): Secondary | ICD-10-CM | POA: Diagnosis not present

## 2022-04-16 DIAGNOSIS — G20 Parkinson's disease: Secondary | ICD-10-CM | POA: Diagnosis not present

## 2022-04-16 DIAGNOSIS — R490 Dysphonia: Secondary | ICD-10-CM | POA: Diagnosis not present

## 2022-04-16 DIAGNOSIS — R41841 Cognitive communication deficit: Secondary | ICD-10-CM | POA: Diagnosis not present

## 2022-04-16 DIAGNOSIS — S62514A Nondisplaced fracture of proximal phalanx of right thumb, initial encounter for closed fracture: Secondary | ICD-10-CM | POA: Diagnosis not present

## 2022-04-17 DIAGNOSIS — R296 Repeated falls: Secondary | ICD-10-CM | POA: Diagnosis not present

## 2022-04-17 DIAGNOSIS — R490 Dysphonia: Secondary | ICD-10-CM | POA: Diagnosis not present

## 2022-04-17 DIAGNOSIS — R41841 Cognitive communication deficit: Secondary | ICD-10-CM | POA: Diagnosis not present

## 2022-04-17 DIAGNOSIS — R2689 Other abnormalities of gait and mobility: Secondary | ICD-10-CM | POA: Diagnosis not present

## 2022-04-17 DIAGNOSIS — M6281 Muscle weakness (generalized): Secondary | ICD-10-CM | POA: Diagnosis not present

## 2022-04-17 DIAGNOSIS — R488 Other symbolic dysfunctions: Secondary | ICD-10-CM | POA: Diagnosis not present

## 2022-04-17 DIAGNOSIS — G20 Parkinson's disease: Secondary | ICD-10-CM | POA: Diagnosis not present

## 2022-04-17 DIAGNOSIS — N3946 Mixed incontinence: Secondary | ICD-10-CM | POA: Diagnosis not present

## 2022-04-18 DIAGNOSIS — R41841 Cognitive communication deficit: Secondary | ICD-10-CM | POA: Diagnosis not present

## 2022-04-18 DIAGNOSIS — R2689 Other abnormalities of gait and mobility: Secondary | ICD-10-CM | POA: Diagnosis not present

## 2022-04-18 DIAGNOSIS — G20 Parkinson's disease: Secondary | ICD-10-CM | POA: Diagnosis not present

## 2022-04-18 DIAGNOSIS — R490 Dysphonia: Secondary | ICD-10-CM | POA: Diagnosis not present

## 2022-04-18 DIAGNOSIS — N3946 Mixed incontinence: Secondary | ICD-10-CM | POA: Diagnosis not present

## 2022-04-18 DIAGNOSIS — R488 Other symbolic dysfunctions: Secondary | ICD-10-CM | POA: Diagnosis not present

## 2022-04-18 DIAGNOSIS — R296 Repeated falls: Secondary | ICD-10-CM | POA: Diagnosis not present

## 2022-04-18 DIAGNOSIS — M6281 Muscle weakness (generalized): Secondary | ICD-10-CM | POA: Diagnosis not present

## 2022-04-19 ENCOUNTER — Telehealth: Payer: Self-pay | Admitting: Internal Medicine

## 2022-04-19 DIAGNOSIS — R41841 Cognitive communication deficit: Secondary | ICD-10-CM | POA: Diagnosis not present

## 2022-04-19 DIAGNOSIS — R488 Other symbolic dysfunctions: Secondary | ICD-10-CM | POA: Diagnosis not present

## 2022-04-19 DIAGNOSIS — R490 Dysphonia: Secondary | ICD-10-CM | POA: Diagnosis not present

## 2022-04-19 DIAGNOSIS — G20 Parkinson's disease: Secondary | ICD-10-CM | POA: Diagnosis not present

## 2022-04-19 DIAGNOSIS — R2689 Other abnormalities of gait and mobility: Secondary | ICD-10-CM | POA: Diagnosis not present

## 2022-04-19 DIAGNOSIS — R296 Repeated falls: Secondary | ICD-10-CM | POA: Diagnosis not present

## 2022-04-19 DIAGNOSIS — M6281 Muscle weakness (generalized): Secondary | ICD-10-CM | POA: Diagnosis not present

## 2022-04-19 DIAGNOSIS — N3946 Mixed incontinence: Secondary | ICD-10-CM | POA: Diagnosis not present

## 2022-04-19 NOTE — Telephone Encounter (Signed)
Returned call to Pt's wife.  Advised to follow PT direction so that Pt can be successful with PT.  Wife in agreement

## 2022-04-19 NOTE — Telephone Encounter (Signed)
Pt c/o medication issue:  1. Name of Medication: fludrocortisone (FLORINEF) 0.1 MG tablet  2. How are you currently taking this medication (dosage and times per day)? Take 2 tablets (0.2 mg total) by mouth 2 (two) times daily.Patient taking differently: Take 0.2 mg by mouth 2 (two) times daily. 3 pills  3. Are you having a reaction (difficulty breathing--STAT)?   4. What is your medication issue? Patient's wife calling wanting to know if she can give medication to him at 6:30am, earlier than the time she normally gives it to him.  However if it's given to him at 6:30am he will lay back down for another hour.  She wants to know if this will be okay.  Patient is at an assisted living and they do PT with him, they feel by him taking the medication earlier will help him to be able to get up and go better in the mornings.

## 2022-04-20 DIAGNOSIS — G20 Parkinson's disease: Secondary | ICD-10-CM | POA: Diagnosis not present

## 2022-04-20 DIAGNOSIS — M6281 Muscle weakness (generalized): Secondary | ICD-10-CM | POA: Diagnosis not present

## 2022-04-20 DIAGNOSIS — N3946 Mixed incontinence: Secondary | ICD-10-CM | POA: Diagnosis not present

## 2022-04-20 DIAGNOSIS — R296 Repeated falls: Secondary | ICD-10-CM | POA: Diagnosis not present

## 2022-04-23 DIAGNOSIS — M6281 Muscle weakness (generalized): Secondary | ICD-10-CM | POA: Diagnosis not present

## 2022-04-23 DIAGNOSIS — R490 Dysphonia: Secondary | ICD-10-CM | POA: Diagnosis not present

## 2022-04-23 DIAGNOSIS — R41841 Cognitive communication deficit: Secondary | ICD-10-CM | POA: Diagnosis not present

## 2022-04-23 DIAGNOSIS — N3946 Mixed incontinence: Secondary | ICD-10-CM | POA: Diagnosis not present

## 2022-04-23 DIAGNOSIS — G20 Parkinson's disease: Secondary | ICD-10-CM | POA: Diagnosis not present

## 2022-04-23 DIAGNOSIS — R296 Repeated falls: Secondary | ICD-10-CM | POA: Diagnosis not present

## 2022-04-23 DIAGNOSIS — R2689 Other abnormalities of gait and mobility: Secondary | ICD-10-CM | POA: Diagnosis not present

## 2022-04-23 DIAGNOSIS — R488 Other symbolic dysfunctions: Secondary | ICD-10-CM | POA: Diagnosis not present

## 2022-04-24 DIAGNOSIS — N3946 Mixed incontinence: Secondary | ICD-10-CM | POA: Diagnosis not present

## 2022-04-24 DIAGNOSIS — R296 Repeated falls: Secondary | ICD-10-CM | POA: Diagnosis not present

## 2022-04-24 DIAGNOSIS — R490 Dysphonia: Secondary | ICD-10-CM | POA: Diagnosis not present

## 2022-04-24 DIAGNOSIS — R2689 Other abnormalities of gait and mobility: Secondary | ICD-10-CM | POA: Diagnosis not present

## 2022-04-24 DIAGNOSIS — R41841 Cognitive communication deficit: Secondary | ICD-10-CM | POA: Diagnosis not present

## 2022-04-24 DIAGNOSIS — G20 Parkinson's disease: Secondary | ICD-10-CM | POA: Diagnosis not present

## 2022-04-24 DIAGNOSIS — R488 Other symbolic dysfunctions: Secondary | ICD-10-CM | POA: Diagnosis not present

## 2022-04-24 DIAGNOSIS — M6281 Muscle weakness (generalized): Secondary | ICD-10-CM | POA: Diagnosis not present

## 2022-04-25 DIAGNOSIS — N3946 Mixed incontinence: Secondary | ICD-10-CM | POA: Diagnosis not present

## 2022-04-25 DIAGNOSIS — R490 Dysphonia: Secondary | ICD-10-CM | POA: Diagnosis not present

## 2022-04-25 DIAGNOSIS — R41841 Cognitive communication deficit: Secondary | ICD-10-CM | POA: Diagnosis not present

## 2022-04-25 DIAGNOSIS — M6281 Muscle weakness (generalized): Secondary | ICD-10-CM | POA: Diagnosis not present

## 2022-04-25 DIAGNOSIS — R296 Repeated falls: Secondary | ICD-10-CM | POA: Diagnosis not present

## 2022-04-25 DIAGNOSIS — R2689 Other abnormalities of gait and mobility: Secondary | ICD-10-CM | POA: Diagnosis not present

## 2022-04-25 DIAGNOSIS — R488 Other symbolic dysfunctions: Secondary | ICD-10-CM | POA: Diagnosis not present

## 2022-04-25 DIAGNOSIS — G20 Parkinson's disease: Secondary | ICD-10-CM | POA: Diagnosis not present

## 2022-04-26 DIAGNOSIS — R2689 Other abnormalities of gait and mobility: Secondary | ICD-10-CM | POA: Diagnosis not present

## 2022-04-26 DIAGNOSIS — R490 Dysphonia: Secondary | ICD-10-CM | POA: Diagnosis not present

## 2022-04-26 DIAGNOSIS — N3946 Mixed incontinence: Secondary | ICD-10-CM | POA: Diagnosis not present

## 2022-04-26 DIAGNOSIS — R296 Repeated falls: Secondary | ICD-10-CM | POA: Diagnosis not present

## 2022-04-26 DIAGNOSIS — M6281 Muscle weakness (generalized): Secondary | ICD-10-CM | POA: Diagnosis not present

## 2022-04-26 DIAGNOSIS — R488 Other symbolic dysfunctions: Secondary | ICD-10-CM | POA: Diagnosis not present

## 2022-04-26 DIAGNOSIS — G20 Parkinson's disease: Secondary | ICD-10-CM | POA: Diagnosis not present

## 2022-04-26 DIAGNOSIS — R41841 Cognitive communication deficit: Secondary | ICD-10-CM | POA: Diagnosis not present

## 2022-04-27 DIAGNOSIS — N3946 Mixed incontinence: Secondary | ICD-10-CM | POA: Diagnosis not present

## 2022-04-27 DIAGNOSIS — G20 Parkinson's disease: Secondary | ICD-10-CM | POA: Diagnosis not present

## 2022-04-27 DIAGNOSIS — M6281 Muscle weakness (generalized): Secondary | ICD-10-CM | POA: Diagnosis not present

## 2022-04-27 DIAGNOSIS — R296 Repeated falls: Secondary | ICD-10-CM | POA: Diagnosis not present

## 2022-04-30 DIAGNOSIS — M6281 Muscle weakness (generalized): Secondary | ICD-10-CM | POA: Diagnosis not present

## 2022-04-30 DIAGNOSIS — G20 Parkinson's disease: Secondary | ICD-10-CM | POA: Diagnosis not present

## 2022-04-30 DIAGNOSIS — R296 Repeated falls: Secondary | ICD-10-CM | POA: Diagnosis not present

## 2022-04-30 DIAGNOSIS — R2689 Other abnormalities of gait and mobility: Secondary | ICD-10-CM | POA: Diagnosis not present

## 2022-04-30 DIAGNOSIS — N3946 Mixed incontinence: Secondary | ICD-10-CM | POA: Diagnosis not present

## 2022-05-02 ENCOUNTER — Telehealth: Payer: Self-pay | Admitting: Neurology

## 2022-05-02 DIAGNOSIS — R2689 Other abnormalities of gait and mobility: Secondary | ICD-10-CM | POA: Diagnosis not present

## 2022-05-02 DIAGNOSIS — R3915 Urgency of urination: Secondary | ICD-10-CM | POA: Diagnosis not present

## 2022-05-02 DIAGNOSIS — N3946 Mixed incontinence: Secondary | ICD-10-CM | POA: Diagnosis not present

## 2022-05-02 DIAGNOSIS — R42 Dizziness and giddiness: Secondary | ICD-10-CM | POA: Diagnosis not present

## 2022-05-02 DIAGNOSIS — G20 Parkinson's disease: Secondary | ICD-10-CM | POA: Diagnosis not present

## 2022-05-02 DIAGNOSIS — M6281 Muscle weakness (generalized): Secondary | ICD-10-CM | POA: Diagnosis not present

## 2022-05-02 DIAGNOSIS — R296 Repeated falls: Secondary | ICD-10-CM | POA: Diagnosis not present

## 2022-05-02 DIAGNOSIS — R4182 Altered mental status, unspecified: Secondary | ICD-10-CM | POA: Diagnosis not present

## 2022-05-02 NOTE — Telephone Encounter (Signed)
Called patients wife and she informed me that she is not sure what is going on with [place]. She feels patient has been going down a downward spiral who has been hallucinating, not able to walk, disoriented, thought he was at his old job in the past, and trying to grab stuff that aren't there. Patients wife stated that he actually had a better day today with a few weird moments. Patients wife stated that patient had fell this morning and was able to call her on the phone while he was on the floor to let her know that he fell.  Patients wife stated that they are having trouble with the facility. The facility wants to transfer him into skilled nursing but she doesn't think he is ready for that. He is too active and likes to be around people. Patients wife thinks that he will fade away there.   I asked if he got checked for an UTI? Patients wife stated that she took him to an urgent care and they informed her that because he takes the entacapone and it turns his urine orange they were not able to get accurate results.   Patients wife wants to know what to do at this point. Patients wife was informed that Dr. Arbutus Leas is out of the office and that I would send her message to the covering physicians.

## 2022-05-02 NOTE — Telephone Encounter (Signed)
Patients wife called and wanted to discuss some things about Zachary [name] with someone.  She states she is leaving for out of town Sat and has some questions.

## 2022-05-03 DIAGNOSIS — R296 Repeated falls: Secondary | ICD-10-CM | POA: Diagnosis not present

## 2022-05-03 DIAGNOSIS — F02A18 Dementia in other diseases classified elsewhere, mild, with other behavioral disturbance: Secondary | ICD-10-CM | POA: Diagnosis not present

## 2022-05-03 DIAGNOSIS — G20 Parkinson's disease: Secondary | ICD-10-CM | POA: Diagnosis not present

## 2022-05-03 DIAGNOSIS — R41841 Cognitive communication deficit: Secondary | ICD-10-CM | POA: Diagnosis not present

## 2022-05-03 DIAGNOSIS — R443 Hallucinations, unspecified: Secondary | ICD-10-CM | POA: Diagnosis not present

## 2022-05-03 DIAGNOSIS — R488 Other symbolic dysfunctions: Secondary | ICD-10-CM | POA: Diagnosis not present

## 2022-05-03 DIAGNOSIS — I951 Orthostatic hypotension: Secondary | ICD-10-CM | POA: Diagnosis not present

## 2022-05-03 DIAGNOSIS — R490 Dysphonia: Secondary | ICD-10-CM | POA: Diagnosis not present

## 2022-05-03 NOTE — Telephone Encounter (Signed)
Patients wife is taking him to get scanned for a UTI

## 2022-05-03 NOTE — Telephone Encounter (Signed)
Pt's wife called in stating the pt had a really bad night last night. She is anxious to find out what she should do.

## 2022-05-04 DIAGNOSIS — G20 Parkinson's disease: Secondary | ICD-10-CM | POA: Diagnosis not present

## 2022-05-04 DIAGNOSIS — R2689 Other abnormalities of gait and mobility: Secondary | ICD-10-CM | POA: Diagnosis not present

## 2022-05-04 DIAGNOSIS — R488 Other symbolic dysfunctions: Secondary | ICD-10-CM | POA: Diagnosis not present

## 2022-05-04 DIAGNOSIS — R41841 Cognitive communication deficit: Secondary | ICD-10-CM | POA: Diagnosis not present

## 2022-05-04 DIAGNOSIS — M6281 Muscle weakness (generalized): Secondary | ICD-10-CM | POA: Diagnosis not present

## 2022-05-04 DIAGNOSIS — R490 Dysphonia: Secondary | ICD-10-CM | POA: Diagnosis not present

## 2022-05-05 DIAGNOSIS — R41841 Cognitive communication deficit: Secondary | ICD-10-CM | POA: Diagnosis not present

## 2022-05-05 DIAGNOSIS — R488 Other symbolic dysfunctions: Secondary | ICD-10-CM | POA: Diagnosis not present

## 2022-05-05 DIAGNOSIS — R490 Dysphonia: Secondary | ICD-10-CM | POA: Diagnosis not present

## 2022-05-07 DIAGNOSIS — M6281 Muscle weakness (generalized): Secondary | ICD-10-CM | POA: Diagnosis not present

## 2022-05-07 DIAGNOSIS — R490 Dysphonia: Secondary | ICD-10-CM | POA: Diagnosis not present

## 2022-05-07 DIAGNOSIS — G20 Parkinson's disease: Secondary | ICD-10-CM | POA: Diagnosis not present

## 2022-05-07 DIAGNOSIS — N3946 Mixed incontinence: Secondary | ICD-10-CM | POA: Diagnosis not present

## 2022-05-07 DIAGNOSIS — R296 Repeated falls: Secondary | ICD-10-CM | POA: Diagnosis not present

## 2022-05-07 DIAGNOSIS — R488 Other symbolic dysfunctions: Secondary | ICD-10-CM | POA: Diagnosis not present

## 2022-05-07 DIAGNOSIS — R41841 Cognitive communication deficit: Secondary | ICD-10-CM | POA: Diagnosis not present

## 2022-05-08 DIAGNOSIS — R488 Other symbolic dysfunctions: Secondary | ICD-10-CM | POA: Diagnosis not present

## 2022-05-08 DIAGNOSIS — M6281 Muscle weakness (generalized): Secondary | ICD-10-CM | POA: Diagnosis not present

## 2022-05-08 DIAGNOSIS — R41841 Cognitive communication deficit: Secondary | ICD-10-CM | POA: Diagnosis not present

## 2022-05-08 DIAGNOSIS — R490 Dysphonia: Secondary | ICD-10-CM | POA: Diagnosis not present

## 2022-05-08 DIAGNOSIS — N3946 Mixed incontinence: Secondary | ICD-10-CM | POA: Diagnosis not present

## 2022-05-08 DIAGNOSIS — G20 Parkinson's disease: Secondary | ICD-10-CM | POA: Diagnosis not present

## 2022-05-08 DIAGNOSIS — R296 Repeated falls: Secondary | ICD-10-CM | POA: Diagnosis not present

## 2022-05-09 DIAGNOSIS — M6281 Muscle weakness (generalized): Secondary | ICD-10-CM | POA: Diagnosis not present

## 2022-05-09 DIAGNOSIS — R2689 Other abnormalities of gait and mobility: Secondary | ICD-10-CM | POA: Diagnosis not present

## 2022-05-09 DIAGNOSIS — G20 Parkinson's disease: Secondary | ICD-10-CM | POA: Diagnosis not present

## 2022-05-11 DIAGNOSIS — M6281 Muscle weakness (generalized): Secondary | ICD-10-CM | POA: Diagnosis not present

## 2022-05-11 DIAGNOSIS — R490 Dysphonia: Secondary | ICD-10-CM | POA: Diagnosis not present

## 2022-05-11 DIAGNOSIS — R41841 Cognitive communication deficit: Secondary | ICD-10-CM | POA: Diagnosis not present

## 2022-05-11 DIAGNOSIS — N3946 Mixed incontinence: Secondary | ICD-10-CM | POA: Diagnosis not present

## 2022-05-11 DIAGNOSIS — R296 Repeated falls: Secondary | ICD-10-CM | POA: Diagnosis not present

## 2022-05-11 DIAGNOSIS — R488 Other symbolic dysfunctions: Secondary | ICD-10-CM | POA: Diagnosis not present

## 2022-05-11 DIAGNOSIS — R2689 Other abnormalities of gait and mobility: Secondary | ICD-10-CM | POA: Diagnosis not present

## 2022-05-11 DIAGNOSIS — G20 Parkinson's disease: Secondary | ICD-10-CM | POA: Diagnosis not present

## 2022-05-12 DIAGNOSIS — M6281 Muscle weakness (generalized): Secondary | ICD-10-CM | POA: Diagnosis not present

## 2022-05-12 DIAGNOSIS — R296 Repeated falls: Secondary | ICD-10-CM | POA: Diagnosis not present

## 2022-05-12 DIAGNOSIS — G20 Parkinson's disease: Secondary | ICD-10-CM | POA: Diagnosis not present

## 2022-05-12 DIAGNOSIS — N3946 Mixed incontinence: Secondary | ICD-10-CM | POA: Diagnosis not present

## 2022-05-14 DIAGNOSIS — R2689 Other abnormalities of gait and mobility: Secondary | ICD-10-CM | POA: Diagnosis not present

## 2022-05-14 DIAGNOSIS — N3946 Mixed incontinence: Secondary | ICD-10-CM | POA: Diagnosis not present

## 2022-05-14 DIAGNOSIS — R296 Repeated falls: Secondary | ICD-10-CM | POA: Diagnosis not present

## 2022-05-14 DIAGNOSIS — G20 Parkinson's disease: Secondary | ICD-10-CM | POA: Diagnosis not present

## 2022-05-14 DIAGNOSIS — M6281 Muscle weakness (generalized): Secondary | ICD-10-CM | POA: Diagnosis not present

## 2022-05-15 DIAGNOSIS — N3946 Mixed incontinence: Secondary | ICD-10-CM | POA: Diagnosis not present

## 2022-05-15 DIAGNOSIS — R296 Repeated falls: Secondary | ICD-10-CM | POA: Diagnosis not present

## 2022-05-15 DIAGNOSIS — M6281 Muscle weakness (generalized): Secondary | ICD-10-CM | POA: Diagnosis not present

## 2022-05-15 DIAGNOSIS — G20 Parkinson's disease: Secondary | ICD-10-CM | POA: Diagnosis not present

## 2022-05-16 DIAGNOSIS — R2689 Other abnormalities of gait and mobility: Secondary | ICD-10-CM | POA: Diagnosis not present

## 2022-05-16 DIAGNOSIS — R296 Repeated falls: Secondary | ICD-10-CM | POA: Diagnosis not present

## 2022-05-16 DIAGNOSIS — G20 Parkinson's disease: Secondary | ICD-10-CM | POA: Diagnosis not present

## 2022-05-16 DIAGNOSIS — M6281 Muscle weakness (generalized): Secondary | ICD-10-CM | POA: Diagnosis not present

## 2022-05-16 DIAGNOSIS — N3946 Mixed incontinence: Secondary | ICD-10-CM | POA: Diagnosis not present

## 2022-05-17 DIAGNOSIS — N3946 Mixed incontinence: Secondary | ICD-10-CM | POA: Diagnosis not present

## 2022-05-17 DIAGNOSIS — R296 Repeated falls: Secondary | ICD-10-CM | POA: Diagnosis not present

## 2022-05-17 DIAGNOSIS — G20 Parkinson's disease: Secondary | ICD-10-CM | POA: Diagnosis not present

## 2022-05-17 DIAGNOSIS — M6281 Muscle weakness (generalized): Secondary | ICD-10-CM | POA: Diagnosis not present

## 2022-05-18 DIAGNOSIS — N3946 Mixed incontinence: Secondary | ICD-10-CM | POA: Diagnosis not present

## 2022-05-18 DIAGNOSIS — R488 Other symbolic dysfunctions: Secondary | ICD-10-CM | POA: Diagnosis not present

## 2022-05-18 DIAGNOSIS — R490 Dysphonia: Secondary | ICD-10-CM | POA: Diagnosis not present

## 2022-05-18 DIAGNOSIS — R296 Repeated falls: Secondary | ICD-10-CM | POA: Diagnosis not present

## 2022-05-18 DIAGNOSIS — G20 Parkinson's disease: Secondary | ICD-10-CM | POA: Diagnosis not present

## 2022-05-18 DIAGNOSIS — R41841 Cognitive communication deficit: Secondary | ICD-10-CM | POA: Diagnosis not present

## 2022-05-18 DIAGNOSIS — M6281 Muscle weakness (generalized): Secondary | ICD-10-CM | POA: Diagnosis not present

## 2022-05-21 DIAGNOSIS — R490 Dysphonia: Secondary | ICD-10-CM | POA: Diagnosis not present

## 2022-05-21 DIAGNOSIS — G20 Parkinson's disease: Secondary | ICD-10-CM | POA: Diagnosis not present

## 2022-05-21 DIAGNOSIS — R41841 Cognitive communication deficit: Secondary | ICD-10-CM | POA: Diagnosis not present

## 2022-05-21 DIAGNOSIS — R488 Other symbolic dysfunctions: Secondary | ICD-10-CM | POA: Diagnosis not present

## 2022-05-21 DIAGNOSIS — N3946 Mixed incontinence: Secondary | ICD-10-CM | POA: Diagnosis not present

## 2022-05-21 DIAGNOSIS — M6281 Muscle weakness (generalized): Secondary | ICD-10-CM | POA: Diagnosis not present

## 2022-05-21 DIAGNOSIS — R2689 Other abnormalities of gait and mobility: Secondary | ICD-10-CM | POA: Diagnosis not present

## 2022-05-21 DIAGNOSIS — R296 Repeated falls: Secondary | ICD-10-CM | POA: Diagnosis not present

## 2022-05-22 DIAGNOSIS — R296 Repeated falls: Secondary | ICD-10-CM | POA: Diagnosis not present

## 2022-05-22 DIAGNOSIS — M6281 Muscle weakness (generalized): Secondary | ICD-10-CM | POA: Diagnosis not present

## 2022-05-22 DIAGNOSIS — G20 Parkinson's disease: Secondary | ICD-10-CM | POA: Diagnosis not present

## 2022-05-22 DIAGNOSIS — R490 Dysphonia: Secondary | ICD-10-CM | POA: Diagnosis not present

## 2022-05-22 DIAGNOSIS — R488 Other symbolic dysfunctions: Secondary | ICD-10-CM | POA: Diagnosis not present

## 2022-05-22 DIAGNOSIS — R41841 Cognitive communication deficit: Secondary | ICD-10-CM | POA: Diagnosis not present

## 2022-05-22 DIAGNOSIS — N3946 Mixed incontinence: Secondary | ICD-10-CM | POA: Diagnosis not present

## 2022-05-23 DIAGNOSIS — R490 Dysphonia: Secondary | ICD-10-CM | POA: Diagnosis not present

## 2022-05-23 DIAGNOSIS — M6281 Muscle weakness (generalized): Secondary | ICD-10-CM | POA: Diagnosis not present

## 2022-05-23 DIAGNOSIS — R2689 Other abnormalities of gait and mobility: Secondary | ICD-10-CM | POA: Diagnosis not present

## 2022-05-23 DIAGNOSIS — R41841 Cognitive communication deficit: Secondary | ICD-10-CM | POA: Diagnosis not present

## 2022-05-23 DIAGNOSIS — R488 Other symbolic dysfunctions: Secondary | ICD-10-CM | POA: Diagnosis not present

## 2022-05-23 DIAGNOSIS — G20 Parkinson's disease: Secondary | ICD-10-CM | POA: Diagnosis not present

## 2022-05-23 DIAGNOSIS — R296 Repeated falls: Secondary | ICD-10-CM | POA: Diagnosis not present

## 2022-05-23 DIAGNOSIS — N3946 Mixed incontinence: Secondary | ICD-10-CM | POA: Diagnosis not present

## 2022-05-24 DIAGNOSIS — N3946 Mixed incontinence: Secondary | ICD-10-CM | POA: Diagnosis not present

## 2022-05-24 DIAGNOSIS — M6281 Muscle weakness (generalized): Secondary | ICD-10-CM | POA: Diagnosis not present

## 2022-05-24 DIAGNOSIS — R296 Repeated falls: Secondary | ICD-10-CM | POA: Diagnosis not present

## 2022-05-24 DIAGNOSIS — G20 Parkinson's disease: Secondary | ICD-10-CM | POA: Diagnosis not present

## 2022-05-25 DIAGNOSIS — G20 Parkinson's disease: Secondary | ICD-10-CM | POA: Diagnosis not present

## 2022-05-25 DIAGNOSIS — R296 Repeated falls: Secondary | ICD-10-CM | POA: Diagnosis not present

## 2022-05-25 DIAGNOSIS — N3946 Mixed incontinence: Secondary | ICD-10-CM | POA: Diagnosis not present

## 2022-05-25 DIAGNOSIS — M6281 Muscle weakness (generalized): Secondary | ICD-10-CM | POA: Diagnosis not present

## 2022-05-28 DIAGNOSIS — R488 Other symbolic dysfunctions: Secondary | ICD-10-CM | POA: Diagnosis not present

## 2022-05-28 DIAGNOSIS — R41841 Cognitive communication deficit: Secondary | ICD-10-CM | POA: Diagnosis not present

## 2022-05-28 DIAGNOSIS — G20 Parkinson's disease: Secondary | ICD-10-CM | POA: Diagnosis not present

## 2022-05-28 DIAGNOSIS — M6281 Muscle weakness (generalized): Secondary | ICD-10-CM | POA: Diagnosis not present

## 2022-05-28 DIAGNOSIS — R490 Dysphonia: Secondary | ICD-10-CM | POA: Diagnosis not present

## 2022-05-28 DIAGNOSIS — N3946 Mixed incontinence: Secondary | ICD-10-CM | POA: Diagnosis not present

## 2022-05-28 DIAGNOSIS — R296 Repeated falls: Secondary | ICD-10-CM | POA: Diagnosis not present

## 2022-05-29 ENCOUNTER — Encounter (HOSPITAL_COMMUNITY): Payer: Self-pay | Admitting: Oncology

## 2022-05-29 ENCOUNTER — Inpatient Hospital Stay (HOSPITAL_COMMUNITY)
Admission: EM | Admit: 2022-05-29 | Discharge: 2022-06-01 | DRG: 194 | Disposition: A | Discharge status: Home Health | Payer: Medicare PPO | Source: Skilled Nursing Facility | Attending: Internal Medicine | Admitting: Internal Medicine

## 2022-05-29 ENCOUNTER — Emergency Department (HOSPITAL_COMMUNITY): Payer: Medicare PPO

## 2022-05-29 ENCOUNTER — Other Ambulatory Visit: Payer: Self-pay

## 2022-05-29 DIAGNOSIS — J45909 Unspecified asthma, uncomplicated: Secondary | ICD-10-CM | POA: Diagnosis not present

## 2022-05-29 DIAGNOSIS — M25519 Pain in unspecified shoulder: Secondary | ICD-10-CM | POA: Diagnosis not present

## 2022-05-29 DIAGNOSIS — Z91018 Allergy to other foods: Secondary | ICD-10-CM | POA: Diagnosis not present

## 2022-05-29 DIAGNOSIS — Z79899 Other long term (current) drug therapy: Secondary | ICD-10-CM | POA: Diagnosis not present

## 2022-05-29 DIAGNOSIS — Z820 Family history of epilepsy and other diseases of the nervous system: Secondary | ICD-10-CM | POA: Diagnosis not present

## 2022-05-29 DIAGNOSIS — Z823 Family history of stroke: Secondary | ICD-10-CM | POA: Diagnosis not present

## 2022-05-29 DIAGNOSIS — R531 Weakness: Secondary | ICD-10-CM | POA: Diagnosis not present

## 2022-05-29 DIAGNOSIS — I951 Orthostatic hypotension: Secondary | ICD-10-CM | POA: Diagnosis present

## 2022-05-29 DIAGNOSIS — D649 Anemia, unspecified: Secondary | ICD-10-CM | POA: Diagnosis present

## 2022-05-29 DIAGNOSIS — Z9841 Cataract extraction status, right eye: Secondary | ICD-10-CM | POA: Diagnosis not present

## 2022-05-29 DIAGNOSIS — J189 Pneumonia, unspecified organism: Principal | ICD-10-CM | POA: Diagnosis present

## 2022-05-29 DIAGNOSIS — Z9842 Cataract extraction status, left eye: Secondary | ICD-10-CM | POA: Diagnosis not present

## 2022-05-29 DIAGNOSIS — Z6824 Body mass index (BMI) 24.0-24.9, adult: Secondary | ICD-10-CM

## 2022-05-29 DIAGNOSIS — R0689 Other abnormalities of breathing: Secondary | ICD-10-CM | POA: Diagnosis not present

## 2022-05-29 DIAGNOSIS — Z833 Family history of diabetes mellitus: Secondary | ICD-10-CM | POA: Diagnosis not present

## 2022-05-29 DIAGNOSIS — N400 Enlarged prostate without lower urinary tract symptoms: Secondary | ICD-10-CM | POA: Diagnosis present

## 2022-05-29 DIAGNOSIS — E869 Volume depletion, unspecified: Secondary | ICD-10-CM | POA: Diagnosis present

## 2022-05-29 DIAGNOSIS — R55 Syncope and collapse: Principal | ICD-10-CM

## 2022-05-29 DIAGNOSIS — E876 Hypokalemia: Secondary | ICD-10-CM | POA: Diagnosis not present

## 2022-05-29 DIAGNOSIS — E440 Moderate protein-calorie malnutrition: Secondary | ICD-10-CM | POA: Diagnosis present

## 2022-05-29 DIAGNOSIS — Z7952 Long term (current) use of systemic steroids: Secondary | ICD-10-CM | POA: Diagnosis not present

## 2022-05-29 DIAGNOSIS — G20 Parkinson's disease: Secondary | ICD-10-CM | POA: Diagnosis present

## 2022-05-29 DIAGNOSIS — R296 Repeated falls: Secondary | ICD-10-CM | POA: Diagnosis not present

## 2022-05-29 DIAGNOSIS — E878 Other disorders of electrolyte and fluid balance, not elsewhere classified: Secondary | ICD-10-CM | POA: Diagnosis present

## 2022-05-29 DIAGNOSIS — R059 Cough, unspecified: Secondary | ICD-10-CM | POA: Diagnosis not present

## 2022-05-29 DIAGNOSIS — N3946 Mixed incontinence: Secondary | ICD-10-CM | POA: Diagnosis not present

## 2022-05-29 DIAGNOSIS — Z8249 Family history of ischemic heart disease and other diseases of the circulatory system: Secondary | ICD-10-CM | POA: Diagnosis not present

## 2022-05-29 DIAGNOSIS — I959 Hypotension, unspecified: Secondary | ICD-10-CM | POA: Diagnosis not present

## 2022-05-29 DIAGNOSIS — G903 Multi-system degeneration of the autonomic nervous system: Secondary | ICD-10-CM | POA: Diagnosis present

## 2022-05-29 DIAGNOSIS — M6281 Muscle weakness (generalized): Secondary | ICD-10-CM | POA: Diagnosis not present

## 2022-05-29 LAB — COMPREHENSIVE METABOLIC PANEL
ALT: 5 U/L (ref 0–44)
AST: 15 U/L (ref 15–41)
Albumin: 2.7 g/dL — ABNORMAL LOW (ref 3.5–5.0)
Alkaline Phosphatase: 63 U/L (ref 38–126)
Anion gap: 10 (ref 5–15)
BUN: 9 mg/dL (ref 8–23)
CO2: 34 mmol/L — ABNORMAL HIGH (ref 22–32)
Calcium: 7.9 mg/dL — ABNORMAL LOW (ref 8.9–10.3)
Chloride: 97 mmol/L — ABNORMAL LOW (ref 98–111)
Creatinine, Ser: 0.78 mg/dL (ref 0.61–1.24)
GFR, Estimated: >60 mL/min (ref >60)
Glucose, Bld: 134 mg/dL — ABNORMAL HIGH (ref 70–99)
Potassium: 2.1 mmol/L — CL (ref 3.5–5.1)
Sodium: 141 mmol/L (ref 135–145)
Total Bilirubin: 0.7 mg/dL (ref 0.3–1.2)
Total Protein: 6.5 g/dL (ref 6.5–8.1)

## 2022-05-29 LAB — CBC WITH DIFFERENTIAL/PLATELET
Abs Immature Granulocytes: 0.05 10*3/uL (ref 0.00–0.07)
Basophils Absolute: 0 10*3/uL (ref 0.0–0.1)
Basophils Relative: 0 %
Eosinophils Absolute: 0 10*3/uL (ref 0.0–0.5)
Eosinophils Relative: 0 %
HCT: 34.9 % — ABNORMAL LOW (ref 39.0–52.0)
Hemoglobin: 11.6 g/dL — ABNORMAL LOW (ref 13.0–17.0)
Immature Granulocytes: 1 %
Lymphocytes Relative: 11 %
Lymphs Abs: 1 10*3/uL (ref 0.7–4.0)
MCH: 32.2 pg (ref 26.0–34.0)
MCHC: 33.2 g/dL (ref 30.0–36.0)
MCV: 96.9 fL (ref 80.0–100.0)
Monocytes Absolute: 0.8 10*3/uL (ref 0.1–1.0)
Monocytes Relative: 9 %
Neutro Abs: 7.4 10*3/uL (ref 1.7–7.7)
Neutrophils Relative %: 79 %
Platelets: 323 10*3/uL (ref 150–400)
RBC: 3.6 MIL/uL — ABNORMAL LOW (ref 4.22–5.81)
RDW: 11.7 % (ref 11.5–15.5)
WBC: 9.3 10*3/uL (ref 4.0–10.5)
nRBC: 0 % (ref 0.0–0.2)

## 2022-05-29 LAB — BASIC METABOLIC PANEL
Anion gap: 7 (ref 5–15)
BUN: 7 mg/dL — ABNORMAL LOW (ref 8–23)
CO2: 33 mmol/L — ABNORMAL HIGH (ref 22–32)
Calcium: 7.7 mg/dL — ABNORMAL LOW (ref 8.9–10.3)
Chloride: 99 mmol/L (ref 98–111)
Creatinine, Ser: 0.64 mg/dL (ref 0.61–1.24)
GFR, Estimated: >60 mL/min (ref >60)
Glucose, Bld: 105 mg/dL — ABNORMAL HIGH (ref 70–99)
Potassium: 2.3 mmol/L — CL (ref 3.5–5.1)
Sodium: 139 mmol/L (ref 135–145)

## 2022-05-29 LAB — URINALYSIS, ROUTINE W REFLEX MICROSCOPIC
Bilirubin Urine: NEGATIVE
Glucose, UA: NEGATIVE mg/dL
Hgb urine dipstick: NEGATIVE
Ketones, ur: NEGATIVE mg/dL
Leukocytes,Ua: NEGATIVE
Nitrite: NEGATIVE
Protein, ur: NEGATIVE mg/dL
Specific Gravity, Urine: 1.011 (ref 1.005–1.030)
pH: 7 (ref 5.0–8.0)

## 2022-05-29 LAB — CBG MONITORING, ED: Glucose-Capillary: 137 mg/dL — ABNORMAL HIGH (ref 70–99)

## 2022-05-29 LAB — PHOSPHORUS: Phosphorus: 2.4 mg/dL — ABNORMAL LOW (ref 2.5–4.6)

## 2022-05-29 LAB — MAGNESIUM: Magnesium: 2 mg/dL (ref 1.7–2.4)

## 2022-05-29 MED ORDER — SODIUM CHLORIDE 0.9% FLUSH
3.0000 mL | Freq: Two times a day (BID) | INTRAVENOUS | Status: DC
Start: 2022-05-29 — End: 2022-06-01
  Administered 2022-05-29 – 2022-06-01 (×6): 3 mL via INTRAVENOUS

## 2022-05-29 MED ORDER — ACETAMINOPHEN 325 MG PO TABS: 650.0000 mg | ORAL_TABLET | Freq: Four times a day (QID) | ORAL | Status: DC | PRN

## 2022-05-29 MED ORDER — MIDODRINE HCL 5 MG PO TABS
5.0000 mg | ORAL_TABLET | Freq: Three times a day (TID) | ORAL | Status: DC
  Administered 2022-05-30 – 2022-06-01 (×6): 5 mg via ORAL
  Filled 2022-05-29 (×8): qty 1

## 2022-05-29 MED ORDER — SODIUM CHLORIDE 0.9 % IV SOLN: INTRAVENOUS | Status: DC

## 2022-05-29 MED ORDER — ONDANSETRON HCL 4 MG PO TABS: 4.0000 mg | ORAL_TABLET | Freq: Four times a day (QID) | ORAL | Status: DC | PRN

## 2022-05-29 MED ORDER — SODIUM CHLORIDE 0.9 % IV SOLN
1.0000 g | Freq: Once | INTRAVENOUS | Status: AC
  Administered 2022-05-29: 1 g via INTRAVENOUS
  Filled 2022-05-29: qty 10

## 2022-05-29 MED ORDER — POTASSIUM CHLORIDE CRYS ER 20 MEQ PO TBCR
40.0000 meq | EXTENDED_RELEASE_TABLET | Freq: Three times a day (TID) | ORAL | Status: DC
  Administered 2022-05-29: 40 meq via ORAL
  Filled 2022-05-29 (×2): qty 2

## 2022-05-29 MED ORDER — SODIUM CHLORIDE 0.9 % IV SOLN
1.0000 g | INTRAVENOUS | Status: DC
  Administered 2022-05-30 – 2022-05-31 (×2): 1 g via INTRAVENOUS
  Filled 2022-05-29 (×3): qty 10

## 2022-05-29 MED ORDER — POTASSIUM CHLORIDE CRYS ER 20 MEQ PO TBCR
40.0000 meq | EXTENDED_RELEASE_TABLET | Freq: Three times a day (TID) | ORAL | Status: DC
  Administered 2022-05-29: 40 meq via ORAL
  Filled 2022-05-29: qty 2

## 2022-05-29 MED ORDER — AZITHROMYCIN 250 MG PO TABS
500.0000 mg | ORAL_TABLET | Freq: Once | ORAL | Status: AC
  Administered 2022-05-29: 500 mg via ORAL
  Filled 2022-05-29: qty 2

## 2022-05-29 MED ORDER — FLUDROCORTISONE ACETATE 0.1 MG PO TABS
0.3000 mg | ORAL_TABLET | Freq: Two times a day (BID) | ORAL | Status: DC
  Administered 2022-05-30 (×2): 0.3 mg via ORAL
  Filled 2022-05-29 (×3): qty 3

## 2022-05-29 MED ORDER — HYDRALAZINE HCL 20 MG/ML IJ SOLN
10.0000 mg | Freq: Once | INTRAMUSCULAR | Status: AC
  Administered 2022-05-29: 10 mg via INTRAVENOUS
  Filled 2022-05-29: qty 1

## 2022-05-29 MED ORDER — POTASSIUM CHLORIDE 10 MEQ/100ML IV SOLN
10.0000 meq | INTRAVENOUS | Status: AC
  Administered 2022-05-29 – 2022-05-30 (×4): 10 meq via INTRAVENOUS
  Filled 2022-05-29 (×4): qty 100

## 2022-05-29 MED ORDER — POTASSIUM CHLORIDE CRYS ER 20 MEQ PO TBCR
40.0000 meq | EXTENDED_RELEASE_TABLET | Freq: Three times a day (TID) | ORAL | Status: AC
  Administered 2022-05-29: 40 meq via ORAL
  Filled 2022-05-29: qty 2

## 2022-05-29 MED ORDER — GUAIFENESIN ER 600 MG PO TB12
600.0000 mg | ORAL_TABLET | Freq: Two times a day (BID) | ORAL | Status: DC
Start: 2022-05-29 — End: 2022-06-01
  Administered 2022-05-29 – 2022-06-01 (×6): 600 mg via ORAL
  Filled 2022-05-29 (×6): qty 1

## 2022-05-29 MED ORDER — ACETAMINOPHEN 650 MG RE SUPP: 650.0000 mg | Freq: Four times a day (QID) | RECTAL | Status: DC | PRN

## 2022-05-29 MED ORDER — CARBIDOPA-LEVODOPA ER 50-200 MG PO TBCR
1.0000 | EXTENDED_RELEASE_TABLET | Freq: Every day | ORAL | Status: DC
  Administered 2022-05-29 – 2022-05-31 (×3): 1 via ORAL
  Filled 2022-05-29 (×3): qty 1

## 2022-05-29 MED ORDER — GUAIFENESIN ER 600 MG PO TB12: 600.0000 mg | ORAL_TABLET | Freq: Two times a day (BID) | ORAL | Status: DC

## 2022-05-29 MED ORDER — SODIUM CHLORIDE 0.9 % IV SOLN
500.0000 mg | INTRAVENOUS | Status: DC
  Filled 2022-05-29: qty 5

## 2022-05-29 MED ORDER — ENTACAPONE 200 MG PO TABS
200.0000 mg | ORAL_TABLET | Freq: Three times a day (TID) | ORAL | Status: DC
  Administered 2022-05-30: 200 mg via ORAL
  Filled 2022-05-29 (×4): qty 1

## 2022-05-29 MED ORDER — FINASTERIDE 5 MG PO TABS
5.0000 mg | ORAL_TABLET | Freq: Every day | ORAL | Status: DC
  Administered 2022-05-30 – 2022-06-01 (×3): 5 mg via ORAL
  Filled 2022-05-29 (×3): qty 1

## 2022-05-29 MED ORDER — ONDANSETRON HCL 4 MG/2ML IJ SOLN: 4.0000 mg | Freq: Four times a day (QID) | INTRAMUSCULAR | Status: DC | PRN

## 2022-05-29 MED ORDER — POTASSIUM CHLORIDE 10 MEQ/100ML IV SOLN
10.0000 meq | INTRAVENOUS | Status: AC
  Administered 2022-05-29 (×3): 10 meq via INTRAVENOUS
  Filled 2022-05-29 (×3): qty 100

## 2022-05-29 MED ORDER — CARBIDOPA-LEVODOPA 25-100 MG PO TABS
2.0000 | ORAL_TABLET | Freq: Once | ORAL | Status: AC
  Administered 2022-05-29: 2 via ORAL
  Filled 2022-05-29 (×3): qty 2

## 2022-05-29 MED ORDER — POTASSIUM CHLORIDE 10 MEQ/100ML IV SOLN
10.0000 meq | INTRAVENOUS | Status: DC
Start: 2022-05-29 — End: 2022-05-29

## 2022-05-29 MED ORDER — K PHOS MONO-SOD PHOS DI & MONO 155-852-130 MG PO TABS
500.0000 mg | ORAL_TABLET | Freq: Two times a day (BID) | ORAL | Status: AC
Start: 2022-05-29 — End: 2022-05-30
  Administered 2022-05-29 – 2022-05-30 (×2): 500 mg via ORAL
  Filled 2022-05-29 (×2): qty 2

## 2022-05-29 MED ORDER — SODIUM CHLORIDE 0.9 % IV SOLN: INTRAVENOUS | Status: AC

## 2022-05-29 NOTE — ED Triage Notes (Addendum)
Pt BIB EMS from [facility] assisted living. PT called EMS due to syncopal episode. Hx of Parkinsons. Denies injuries. Pt endorses heaviness in bilateral legs and decreased appetite and cough x4 weeks. A&O x4.   18G LW CBG 126 98/60 to 122/86 96% RA RR 14

## 2022-05-29 NOTE — ED Notes (Signed)
Parkinson's medication not given d/t medication being dropped. Pharmacy aware and sending medication. Receiving nurse notified

## 2022-05-29 NOTE — ED Provider Notes (Signed)
[facility] DEPT Provider Note   CSN: [id number] Arrival date & time: 05/29/22  1005     History  Chief Complaint  Patient presents with   Loss of Consciousness    Zachary [name] is a 71 y.o. male.  Patient is a 71 year old male with a history of Parkinson's disease with orthostatic hypotension on Florinef and midodrine as well as Sinemet frequently throughout the day who is presenting today after a syncopal event.  Patient was working with physical therapy today and they said suddenly he started staring off and would not respond for approximately 1 to 2 minutes.  He did start then mentating but has just been very tired.  Patient reports his legs feel heavy.  He denies any  chest pain, abdominal pain, nausea or vomiting.  He however has been having some of a cough and reports he has been short of breath.  Patient's wife reports he has had significant cough that they have been treating with Mucinex.  He has had no known medication changes.  Speaking with the patient's wife and his caregiver he has been having ongoing issues with hypotension and his midodrine has been been increasing.  He is now on 3 tablets twice a day.  He also takes it at 6:30 in the morning which has helped with his morning hypotension but he does often still get dizzy in the mornings.  They report by late afternoon he is able to sometimes walk with close assistance but is most the time in a wheelchair.  He wants to sleep and keep his eyes closed most of the time.  They deny any choking on food or significant change in mental status.  He has not seen his neurologist in several months and has an appointment in September.  He has frequent falls due to the Parkinson's disease but they are not aware of at any time when he hit his head.  The history is provided by the patient, the spouse and a caregiver.  Loss of Consciousness Episode history:  Single Most recent episode:  Today      Home  Medications Prior to Admission medications   Medication Sig Start Date End Date Taking? Authorizing Provider  carbidopa-levodopa (SINEMET CR) 50-200 MG tablet TAKE ONE TABLET BY MOUTH AT BEDTIME Patient taking differently: Take 1 tablet by mouth at bedtime. 01/03/22  Yes [name], Eustace Quail, DO  AMBULATORY NON [place] MEDICATION Transport chair Dx: Parkinsons Disease G20 12/24/19   [name], Eustace Quail, DO  carbidopa-levodopa (SINEMET IR) 25-100 MG tablet 3 tabs AT 6:30AM/2 at 8:30AM/2 at 10:30AM/2 at 12:30PM/2 at 2:30PM/2 at 4:30pm/2 at 6:30pm. take up to extra tablet (in 1/2 tabs) per day AS NEEDED 09/05/21   [name], Eustace Quail, DO  Cholecalciferol (DIALYVITE VITAMIN D 5000 PO) Take 1 tablet by mouth daily.    [provider]  entacapone (COMTAN) 200 MG tablet Take 1 tablet (200 mg total) by mouth 3 (three) times daily (8:30/12:30/4:30). 12/04/21   [name], Eustace Quail, DO  finasteride (PROSCAR) 5 MG tablet Take 1 tablet by mouth daily. 12/29/21   [provider]  fludrocortisone (FLORINEF) 0.1 MG tablet Take 2 tablets (0.2 mg total) by mouth 2 (two) times daily. Patient taking differently: Take 0.2 mg by mouth 2 (two) times daily. 3 pills 07/04/21   Evans Lance, MD  Lactobacillus (PROBIOTIC ACIDOPHILUS PO) Take 1 tablet by mouth daily.    [provider]  midodrine (PROAMATINE) 5 MG tablet TAKE ONE TABLET BY  MOUTH THREE TIMES A DAILY WITH MEALS 01/18/22   [name], [name], DO  Multiple Vitamins-Minerals (PRESERVISION AREDS 2 PO) Take 1 tablet by mouth daily.    [provider]  tamsulosin (FLOMAX) 0.4 MG CAPS capsule Take 1 capsule by mouth daily. 12/29/21   [provider]      Allergies    Pear and Tree extract    Review of Systems   Review of Systems  Cardiovascular:  Positive for syncope.    Physical Exam Updated Vital Signs BP (!) 144/76   Pulse 66   Temp 97.8 F (36.6 C) (Oral)   Resp 18   SpO2 94%  Physical Exam Vitals and nursing note reviewed.   Constitutional:      General: He is not in acute distress.    Appearance: He is well-developed.  HENT:     Head: Normocephalic and atraumatic.     Mouth/Throat:     Mouth: Mucous membranes are dry.  Eyes:     Conjunctiva/sclera: Conjunctivae normal.     Pupils: Pupils are equal, round, and reactive to light.  Cardiovascular:     Rate and Rhythm: Normal rate and regular rhythm.     Heart sounds: No murmur heard. Pulmonary:     Effort: Pulmonary effort is normal. No respiratory distress.     Breath sounds: Normal breath sounds. No wheezing or rales.  Abdominal:     General: There is no distension.     Palpations: Abdomen is soft.     Tenderness: There is no abdominal tenderness. There is no guarding or rebound.  Musculoskeletal:        General: No tenderness. Normal range of motion.     Cervical back: Normal range of motion and neck supple.  Skin:    General: Skin is warm and dry.     Findings: No erythema or rash.  Neurological:     Mental Status: He is alert and oriented to person, place, and time.     Comments: 4 out of 5 strength in all 4 extremities.  Sensation intact.  Unable to sit up on his own but able to hold himself in a seated position.  Keeps his eyes closed but is able to open.  Psychiatric:        Behavior: Behavior normal.     Comments: Flat affect     ED Results / Procedures / Treatments   Labs (all labs ordered are listed, but only abnormal results are displayed) Labs Reviewed  CBC WITH DIFFERENTIAL/PLATELET - Abnormal; Notable for the following components:      Result Value   RBC 3.60 (*)    Hemoglobin 11.6 (*)    HCT 34.9 (*)    All other components within normal limits  COMPREHENSIVE METABOLIC PANEL - Abnormal; Notable for the following components:   Potassium 2.1 (*)    Chloride 97 (*)    CO2 34 (*)    Glucose, Bld 134 (*)    Calcium 7.9 (*)    Albumin 2.7 (*)    All other components within normal limits  CBG MONITORING, ED - Abnormal;  Notable for the following components:   Glucose-Capillary 137 (*)    All other components within normal limits  URINALYSIS, ROUTINE W REFLEX MICROSCOPIC  MAGNESIUM    EKG EKG Interpretation  Date/Time:  Tuesday May 29 2022 10:41:28 EDT Ventricular Rate:  61 PR Interval:  174 QRS Duration: 107 QT Interval:  434 QTC Calculation: 438 R Axis:  25 Text Interpretation: Sinus rhythm Biatrial enlargement RSR' in V1 or V2, right VCD or RVH Borderline ST depression, lateral leads Confirmed by Gwyneth Sprout ([id number]) on 05/29/2022 11:11:34 AM  Radiology CT Head Wo Contrast  Result Date: 05/29/2022 CLINICAL DATA:  Syncopal episode EXAM: CT HEAD WITHOUT CONTRAST TECHNIQUE: Contiguous axial images were obtained from the base of the skull through the vertex without intravenous contrast. RADIATION DOSE REDUCTION: This exam was performed according to the departmental dose-optimization program which includes automated exposure control, adjustment of the mA and/or kV according to patient size and/or use of iterative reconstruction technique. COMPARISON:  03/18/2022 FINDINGS: Brain: No evidence of acute infarction, hemorrhage, mass, mass effect, or midline shift. No hydrocephalus or extra-axial fluid collection. Moderately advanced cerebral atrophy for age. Periventricular white matter changes, likely the sequela of chronic small vessel ischemic disease. Vascular: No hyperdense vessel. Skull: Normal. Negative for fracture or focal lesion. Sinuses/Orbits: Minimal mucosal thickening in the ethmoid air cells. Status post bilateral lens replacements. Other: The mastoid air cells are well aerated. IMPRESSION: No acute intracranial process. Electronically Signed   By: Wiliam Ke M.D.   On: 05/29/2022 11:50   DG Chest Port 1 View  Result Date: 05/29/2022 CLINICAL DATA:  Syncope and weakness EXAM: PORTABLE CHEST 1 VIEW COMPARISON:  November 2013 FINDINGS: Right upper lobe opacity. Similar mild elevation of the  right hemidiaphragm. Top-normal heart size. No pleural effusion or pneumothorax. IMPRESSION: Right upper lobe opacity suspicious for pneumonia. Follow-up recommended to document resolution. Electronically Signed   By: Guadlupe Spanish M.D.   On: 05/29/2022 10:48    Procedures Procedures    Medications Ordered in ED Medications  0.9 %  sodium chloride infusion ( Intravenous New Bag/Given 05/29/22 1026)  carbidopa-levodopa (SINEMET IR) 25-100 MG per tablet immediate release 2 tablet (has no administration in time range)  potassium chloride 10 mEq in 100 mL IVPB (has no administration in time range)  potassium chloride SA (KLOR-CON M) CR tablet 40 mEq (has no administration in time range)  azithromycin (ZITHROMAX) tablet 500 mg (has no administration in time range)  cefTRIAXone (ROCEPHIN) 1 g in sodium chloride 0.9 % 100 mL IVPB (has no administration in time range)    ED Course/ Medical Decision Making/ A&P                           Medical Decision Making Amount and/or Complexity of Data Reviewed Labs: ordered. Radiology: ordered.  Risk Prescription drug management. Decision regarding hospitalization.   Pt with multiple medical problems and comorbidities and presenting today with a complaint that caries a high risk for morbidity and mortality.  Here today after a syncopal event with physical therapy.  Patient has a known history of Parkinson's disease with orthostatic hypotension and autonomic dysfunction on midodrine twice daily also takes Sinemet multiple times a day.  Patient has had progressive symptoms in the last month.  The only thing different about today's event was he was unconscious for longer than normal.  Usually he just starts staring into space but is able to come back quickly and today he was out for 1 to 2 minutes.  Patient's wife does report that he has been coughing a lot and they even spoke with his PCP and they have been doing Mucinex.  She is not aware of him running a  fever.  Patient has been eating and drinking.  Initially patient's blood pressure was in the 90s when EMS arrived but  he received a 500 mL bolus and here blood pressure is 137/82.  Patient has no focal neuro findings.  He is intermittently coughing on exam but the cough is weak.  He is able to move all extremities and has 4 out of 5 strength throughout.  He is slow to initiate his movements.  Low suspicion for acute cardiac cause of his syncope.  He has no abdominal pain but will ensure no evidence of electrolyte abnormality or UTI.  Also chest x-ray to ensure no evidence of pneumonia but family reports no frequent issues with swallowing or choking.  Patient has had numerous falls as well and they do not think that he ever has hit his head however some of the falls are unwitnessed.  Will CT head to ensure no progressive bleed.  Patient does not take any anticoagulation.  He was given a dose of his Sinemet here.  11:58 AM I independently interpreted patient's labs and EKG today.  ABC without acute findings, CMP today with a potassium of 2.1 with normal renal function, blood sugar of 134 and low chloride of 97.  EKG with nonspecific ST depression which suspect is because from patient's severe hypokalemia.  Unclear what is causing patient's hypokalemia.  He is not on specific medication that should cause it.  Family reports that he has been eating. I have independently visualized and interpreted pt's images today.  Chest x-ray today with right upper lobe abnormality concerning for pneumonia.  Especially in the setting of patient's recent cough.  Head CT without acute findings.  Patient was treated with oral and IV potassium.  Covered with antibiotics for presumed pneumonia.  Feel that patient will require admission for further care.  CRITICAL CARE Performed by: Shahiem Bedwell Total critical care time: 30 minutes Critical care time was exclusive of separately billable procedures and treating other  patients. Critical care was necessary to treat or prevent imminent or life-threatening deterioration. Critical care was time spent personally by me on the following activities: development of treatment plan with patient and/or surrogate as well as nursing, discussions with consultants, evaluation of patient's response to treatment, examination of patient, obtaining history from patient or surrogate, ordering and performing treatments and interventions, ordering and review of laboratory studies, ordering and review of radiographic studies, pulse oximetry and re-evaluation of patient's condition.         Final Clinical Impression(s) / ED Diagnoses Final diagnoses:  Syncope, unspecified syncope type  Hypokalemia  Community acquired pneumonia of right upper lobe of lung  Weakness    Rx / DC Orders ED Discharge Orders     None         Blanchie Dessert, MD 05/29/22 1158

## 2022-05-29 NOTE — H&P (Signed)
History and Physical    Patient: Zachary [name]:[id number] DOB: 06/02/1951 DOA: 05/29/2022 DOS: the patient was seen and examined on 05/29/2022 PCP: [name], MD  Patient coming from: ALF/ILF  Chief Complaint:  Chief Complaint  Patient presents with   Loss of Consciousness   HPI: Zachary [name] is a 71 y.o. male with medical history significant of seasonal allergies, asthma, BPH, chest pain episodes, frequent falls, Parkinson's disease, orthostatic hypotension, history of syncope who was brought via EMS to the emergency department due to losing consciousness.  The physical therapist at the facility said that the patient stare staring off and would not respond for 1 to 2 minutes.  He subsequently responded and stated that he was just very tired.  The patient stated that his legs have been very heavy for several weeks, his appetite is decreased and he has been coughing for the past 4 weeks.  Is not sure if he has had a fever, but has had chills and night sweats.  He has been sleeping more than usual. He denied rhinorrhea, sore throat, wheezing or hemoptysis.  No chest pain, palpitations, diaphoresis, PND, orthopnea or pitting edema of the lower extremities.  No abdominal pain, nausea, emesis, diarrhea, melena or hematochezia.  No flank pain, dysuria, frequency or hematuria.  No polyuria, polydipsia, polyphagia or blurred vision.   ED course: Initial vital signs were temperature 97.8 F, pulse 73, respiration 21, BP 137/82 mmHg O2 sat 92% on room air.  The patient received 2 tablets of Sinemet, 500 mg of azithromycin, ceftriaxone 1 g IVPB, oral and parenteral potassium replacement.  Lab work: Urinalysis was clear.  CBC showed a white count of 9.3, hemoglobin 11.6 g/dL platelets 188.  CMP showed a potassium of 2.1, chloride 97, CO2 of 34 mmol/L.  Glucose 134 mg/dL and albumin 2.7 g/dL.  The rest of the CMP measurements are normal when calcium is corrected.  Imaging: Portable 1 view chest  radiograph showed right upper lobe opacity.  CT head with no acute intracranial process.   Review of Systems: As mentioned in the history of present illness. All other systems reviewed and are negative. 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 He has never used smokeless tobacco. He reports that he does not currently use alcohol. He reports that he does not use drugs.  Allergies  Allergen Reactions   Pear Other (See Comments)    Unknown Not listed on MAR   Tree Extract Other (See Comments)    Unknown Not listed on MAR    Family History  Problem Relation Age of Onset   Alzheimer's disease Mother    Heart disease Father    Tremor Father    Stroke Maternal Grandmother    Diabetes Child     Prior to Admission medications   Medication Sig Start Date End Date  Taking? Authorizing Provider  carbidopa-levodopa (SINEMET CR) 50-200 MG tablet TAKE ONE TABLET BY MOUTH AT BEDTIME Patient taking differently: Take 1 tablet by mouth at bedtime. 01/03/22  Yes [name], [name], DO  carbidopa-levodopa (SINEMET IR) 25-100 MG tablet 3 tabs AT 6:30AM/2 at 8:30AM/2 at 10:30AM/2 at 12:30PM/2 at 2:30PM/2 at 4:30pm/2 at 6:30pm. take up to extra tablet (in 1/2 tabs) per day AS NEEDED Patient taking differently: Take 2-3 tablets by mouth See admin instructions. Take 2 tablets by mouth every 2 hours from 0830- 1830.  Take 3 tablet by mouth daily at 0630. 09/05/21  Yes  [name], [name], DO  entacapone (COMTAN) 200 MG tablet Take 1 tablet (200 mg total) by mouth 3 (three) times daily (8:30/12:30/4:30). Patient taking differently: Take 200 mg by mouth 3 (three) times daily. 12/04/21  Yes [name], [name], DO  finasteride (PROSCAR) 5 MG tablet Take 5 mg by mouth daily. 12/29/21  Yes [provider]  AMBULATORY NON FORMULARY MEDICATION Transport chair Dx: Parkinsons Disease G20 12/24/19   [name], [name], DO  Cholecalciferol (DIALYVITE VITAMIN D 5000 PO) Take 1 tablet by mouth daily.    [provider]  fludrocortisone (FLORINEF) 0.1 MG tablet Take 2 tablets (0.2 mg total) by mouth 2 (two) times daily. Patient taking differently: Take 0.2 mg by mouth 2 (two) times daily. 3 pills 07/04/21   Marinus Maw, MD  Lactobacillus (PROBIOTIC ACIDOPHILUS PO) Take 1 tablet by mouth daily.    [provider]  midodrine (PROAMATINE) 5 MG tablet TAKE ONE TABLET BY MOUTH THREE TIMES A DAILY WITH MEALS 01/18/22   [name], [name], DO  Multiple Vitamins-Minerals (PRESERVISION AREDS 2 PO) Take 1 tablet by mouth daily.    [provider]  tamsulosin (FLOMAX) 0.4 MG CAPS capsule Take 1 capsule by mouth daily. 12/29/21   [provider]    Physical Exam: Vitals:   05/29/22 1021 05/29/22 1045 05/29/22 1100 05/29/22 1115  BP: 137/82 117/65 133/75 (!) 144/76  Pulse: 73 (!) 59 (!) 59 66  Resp: (!) 21 19 19 18   Temp: 97.8 F (36.6 C)     TempSrc: Oral     SpO2: 92% 97% 95% 94%   Physical Exam Vitals and nursing note reviewed.  Constitutional:      Appearance: Normal appearance.  HENT:     Head: Normocephalic.     Mouth/Throat:     Mouth: Mucous membranes are moist.  Eyes:     General: No scleral icterus.    Pupils: Pupils are equal, round, and reactive to light.  Neck:     Vascular: No JVD.  Cardiovascular:     Rate and Rhythm: Normal rate and regular rhythm.     Pulses: Normal pulses.     Heart sounds: S1 normal and S2 normal.   Pulmonary:     Effort: No tachypnea.     Breath sounds: Examination of the left-upper field reveals decreased breath sounds and rales. Decreased breath sounds and rales present. No wheezing or rhonchi.  Abdominal:     General: Bowel sounds are normal. There is no distension.     Palpations: Abdomen is soft.     Tenderness: There is no abdominal tenderness. There is no right CVA tenderness, left CVA tenderness or guarding.  Musculoskeletal:     Cervical back: Neck supple.     Right lower leg: No edema.     Left lower leg: No edema.  Neurological:     General: No focal  deficit present.     Mental Status: He is alert and oriented to person, place, and time.     Cranial Nerves: Cranial nerves 2-12 are intact.     Motor: Tremor present.  Psychiatric:        Mood and Affect: Mood normal.        Behavior: Behavior normal.    Data Reviewed:  There are no new results to review at this time.  Assessment and Plan: Principal Problem:   CAP (community acquired pneumonia) Admit to PCU/inpatient. Continue supplemental oxygen. Scheduled and as needed bronchodilators. Continue ceftriaxone 1 g IVPB daily. Continue azithromycin 500 mg IVPB daily. Check strep pneumoniae urinary antigen. Check sputum Gram stain, culture and sensitivity. Follow-up blood culture and sensitivity. Follow-up CBC and chemistry in the morning.  Active Problems:   Parkinson's disease (HCC) Continue Sinemet. Fall precautions.    Orthostatic hypotension Continue midodrine and fludrocortisone.    Syncope Continue IV fluids.   Replace electrolytes. Check echocardiogram.    Hypokalemia Replacing. Follow-up potassium level.    Hypercarbia In the setting of   Hypochloremia Mineralocorticoid? Volume depletion. Continue current treatment. Rechecking AM.    Hypophosphatemia Replaced.    Normocytic anemia Monitor hematocrit and hemoglobin.    Moderate protein malnutrition (HCC) Protein  supplementation. Consider nutritional services evaluation.    Advance Care Planning:   Code Status: Full Code   Consults:   Family Communication: His son Arlys John was present in his room.  Severity of Illness: The appropriate patient status for this patient is INPATIENT. Inpatient status is judged to be reasonable and necessary in order to provide the required intensity of service to ensure the patient's safety. The patient's presenting symptoms, physical exam findings, and initial radiographic and laboratory data in the context of their chronic comorbidities is felt to place them at high risk for further clinical deterioration. Furthermore, it is not anticipated that the patient will be medically stable for discharge from the hospital within 2 midnights of admission.   * I certify that at the point of admission it is my clinical judgment that the patient will require inpatient hospital care spanning beyond 2 midnights from the point of admission due to high intensity of service, high risk for further deterioration and high frequency of surveillance required.*  Author: Bobette Mo, MD 05/29/2022 12:58 PM  For on call review [email or web address].   This document was prepared using Dragon voice recognition software and may contain some unintended transcription errors.

## 2022-05-30 ENCOUNTER — Inpatient Hospital Stay (HOSPITAL_COMMUNITY): Payer: Medicare PPO

## 2022-05-30 DIAGNOSIS — R55 Syncope and collapse: Secondary | ICD-10-CM | POA: Diagnosis not present

## 2022-05-30 DIAGNOSIS — J189 Pneumonia, unspecified organism: Secondary | ICD-10-CM | POA: Diagnosis not present

## 2022-05-30 LAB — ECHOCARDIOGRAM COMPLETE
AR max vel: 2.51 cm2
AV Area VTI: 2.85 cm2
AV Area mean vel: 2.91 cm2
AV Mean grad: 5 mmHg
AV Peak grad: 10.8 mmHg
Ao pk vel: 1.64 m/s
Area-P 1/2: 3.77 cm2
Calc EF: 51.1 %
Height: 69.5 in
MV VTI: 3.35 cm2
S' Lateral: 3.2 cm
Single Plane A2C EF: 47.4 %
Single Plane A4C EF: 56 %
Weight: 2617.3 oz

## 2022-05-30 LAB — COMPREHENSIVE METABOLIC PANEL
ALT: 7 U/L (ref 0–44)
AST: 13 U/L — ABNORMAL LOW (ref 15–41)
Albumin: 2.6 g/dL — ABNORMAL LOW (ref 3.5–5.0)
Alkaline Phosphatase: 61 U/L (ref 38–126)
Anion gap: 7 (ref 5–15)
BUN: 6 mg/dL — ABNORMAL LOW (ref 8–23)
CO2: 31 mmol/L (ref 22–32)
Calcium: 7.9 mg/dL — ABNORMAL LOW (ref 8.9–10.3)
Chloride: 104 mmol/L (ref 98–111)
Creatinine, Ser: 0.62 mg/dL (ref 0.61–1.24)
GFR, Estimated: >60 mL/min (ref >60)
Glucose, Bld: 96 mg/dL (ref 70–99)
Potassium: 2.7 mmol/L — CL (ref 3.5–5.1)
Sodium: 142 mmol/L (ref 135–145)
Total Bilirubin: 0.6 mg/dL (ref 0.3–1.2)
Total Protein: 6.2 g/dL — ABNORMAL LOW (ref 6.5–8.1)

## 2022-05-30 LAB — CBC
HCT: 34.1 % — ABNORMAL LOW (ref 39.0–52.0)
Hemoglobin: 11.2 g/dL — ABNORMAL LOW (ref 13.0–17.0)
MCH: 31.7 pg (ref 26.0–34.0)
MCHC: 32.8 g/dL (ref 30.0–36.0)
MCV: 96.6 fL (ref 80.0–100.0)
Platelets: 308 10*3/uL (ref 150–400)
RBC: 3.53 MIL/uL — ABNORMAL LOW (ref 4.22–5.81)
RDW: 11.8 % (ref 11.5–15.5)
WBC: 8.6 10*3/uL (ref 4.0–10.5)
nRBC: 0 % (ref 0.0–0.2)

## 2022-05-30 LAB — GLUCOSE, CAPILLARY: Glucose-Capillary: 91 mg/dL (ref 70–99)

## 2022-05-30 LAB — STREP PNEUMONIAE URINARY ANTIGEN: Strep Pneumo Urinary Antigen: NEGATIVE

## 2022-05-30 MED ORDER — AZITHROMYCIN 250 MG PO TABS
500.0000 mg | ORAL_TABLET | Freq: Every day | ORAL | Status: DC
  Administered 2022-05-30 – 2022-06-01 (×3): 500 mg via ORAL
  Filled 2022-05-30 (×3): qty 2

## 2022-05-30 MED ORDER — CARBIDOPA-LEVODOPA 25-100 MG PO TABS
2.0000 | ORAL_TABLET | ORAL | Status: DC
  Administered 2022-05-30 – 2022-06-01 (×12): 2 via ORAL
  Filled 2022-05-30 (×12): qty 2

## 2022-05-30 MED ORDER — ENTACAPONE 200 MG PO TABS
200.0000 mg | ORAL_TABLET | Freq: Three times a day (TID) | ORAL | Status: DC
Start: 2022-05-30 — End: 2022-06-01
  Administered 2022-05-30 – 2022-06-01 (×7): 200 mg via ORAL
  Filled 2022-05-30 (×8): qty 1

## 2022-05-30 MED ORDER — CARBIDOPA-LEVODOPA 25-100 MG PO TABS
3.0000 | ORAL_TABLET | Freq: Every day | ORAL | Status: DC
  Administered 2022-05-31 – 2022-06-01 (×2): 3 via ORAL
  Filled 2022-05-30 (×3): qty 3

## 2022-05-30 MED ORDER — POTASSIUM CHLORIDE CRYS ER 20 MEQ PO TBCR
40.0000 meq | EXTENDED_RELEASE_TABLET | Freq: Two times a day (BID) | ORAL | Status: AC
  Administered 2022-05-30 (×2): 40 meq via ORAL
  Filled 2022-05-30 (×2): qty 2

## 2022-05-30 MED ORDER — POTASSIUM CHLORIDE 10 MEQ/100ML IV SOLN
10.0000 meq | INTRAVENOUS | Status: AC
  Administered 2022-05-30 (×4): 10 meq via INTRAVENOUS
  Filled 2022-05-30 (×4): qty 100

## 2022-05-30 NOTE — Evaluation (Signed)
Physical Therapy Evaluation Patient Details Name: Zachary [name] MRN: [id number] DOB: 02-11-1951 Today's Date: 05/30/2022  History of Present Illness  Patient is 71 y.o. male with PMH significant of seasonal allergies, asthma, BPH, chest pain episodes, frequent falls, Parkinson's disease, orthostatic hypotension, history of syncope who was brought via EMS to the emergency department due to losing consciousness.  The physical therapist at the facility said that the patient stare staring off and would not respond for 1 to 2 minutes.  He subsequently responded and stated that he was just very tired.  The patient stated that his legs have been very heavy for several weeks, his appetite is decreased and he has been coughing for the past 4 weeks.  Is not sure if he has had a fever, but has had chills and night sweats.  He has been sleeping more than usual.    Clinical Impression  Zachary [name] is 72 y.o. male admitted with above HPI and diagnosis. Patient is currently limited by functional impairments below (see PT problem list). Patient lives at ALF and has personal care attendants for assist with mobility and ADL's at baseline. He typically transfers with min assist bed<>wheelchair and has been working with PT on ambulating more functional distances with RW. Patient will benefit from continued skilled PT interventions to address impairments and progress independence with mobility, recommending return to ALF and continue PT/OT/SLP. Acute PT will follow and progress as able.        Recommendations for follow up therapy are one component of a multi-disciplinary discharge planning process, led by the attending physician.  Recommendations may be updated based on patient status, additional functional criteria and insurance authorization.  Follow Up Recommendations Home health PT      Assistance Recommended at Discharge Frequent or constant Supervision/Assistance  Patient can return home with the following   A lot of help with walking and/or transfers;A lot of help with bathing/dressing/bathroom;Assistance with cooking/housework;Assistance with feeding;Direct supervision/assist for medications management;Direct supervision/assist for financial management;Assist for transportation;Help with stairs or ramp for entrance    Equipment Recommendations None recommended by PT  Recommendations for Other Services       Functional Status Assessment Patient has had a recent decline in their functional status and/or demonstrates limited ability to make significant improvements in function in a reasonable and predictable amount of time     Precautions / Restrictions Precautions Precautions: Fall Restrictions Weight Bearing Restrictions: No      Mobility  Bed Mobility Overal bed mobility: Needs Assistance Bed Mobility: Supine to Sit     Supine to sit: HOB elevated, Mod assist     General bed mobility comments: pt able to initiate walking LE's off EOB. cues to reach for bed rail and Mod assist to raise trunk up to EOB.    Transfers Overall transfer level: Needs assistance Equipment used: Rolling walker (2 wheels) Transfers: Sit to/from Stand, Bed to chair/wheelchair/BSC Sit to Stand: Min assist   Step pivot transfers: Mod assist       General transfer comment: Cues for hand palcement on bed and RW for safe power up. min assist to rise and steady in standing. VCs to anterior weight shift and press feet in floor. Mod Assist to manage/guide RW with pivot to recliner. Assist to weight shift Rt/Lt to facilitate steps.    Ambulation/Gait                  Stairs  Wheelchair Mobility    Modified Rankin (Stroke Patients Only)       Balance Overall balance assessment: Needs assistance Sitting-balance support: Feet supported, Bilateral upper extremity supported, Single extremity supported Sitting balance-Leahy Scale: Fair     Standing balance support: Reliant on  assistive device for balance, Bilateral upper extremity supported, During functional activity Standing balance-Leahy Scale: Poor Standing balance comment: heavy reliance on RW and therapist                             Pertinent Vitals/Pain Pain Assessment Pain Assessment: No/denies pain    Home Living Family/patient expects to be discharged to:: Assisted living                   Additional Comments: has caregiver 8a-9p Mon-Sat and Sunday til 8a-2p, but just the staff from ALF at night, she fills in if home aids are not availble.    Prior Function Prior Level of Function : Needs assist       Physical Assist : Mobility (physical);ADLs (physical) Mobility (physical): Bed mobility;Transfers;Gait ADLs (physical): Feeding;Grooming;Bathing;Dressing;Toileting;IADLs         Hand Dominance   Dominant Hand: Right    Extremity/Trunk Assessment   Upper Extremity Assessment Upper Extremity Assessment: Defer to OT evaluation;Generalized weakness    Lower Extremity Assessment Lower Extremity Assessment: Generalized weakness;RLE deficits/detail;LLE deficits/detail RLE Sensation: decreased proprioception RLE Coordination: decreased gross motor;decreased fine motor LLE Sensation: decreased proprioception LLE Coordination: decreased fine motor;decreased gross motor    Cervical / Trunk Assessment Cervical / Trunk Assessment: Normal  Communication   Communication: No difficulties  Cognition Arousal/Alertness: Lethargic Behavior During Therapy: WFL for tasks assessed/performed Overall Cognitive Status: Within Functional Limits for tasks assessed                                          General Comments      Exercises     Assessment/Plan    PT Assessment Patient needs continued PT services  PT Problem List Decreased strength;Decreased activity tolerance;Decreased balance;Decreased mobility;Decreased knowledge of use of DME;Decreased safety  awareness;Decreased coordination       PT Treatment Interventions DME instruction;Gait training;Stair training;Functional mobility training;Therapeutic activities;Therapeutic exercise;Balance training;Neuromuscular re-education;Cognitive remediation;Patient/family education;Wheelchair mobility training    PT Goals (Current goals can be found in the Care Plan section)  Acute Rehab PT Goals Patient Stated Goal: maintain current function PT Goal Formulation: With patient Time For Goal Achievement: 06/13/22 Potential to Achieve Goals: Good    Frequency Min 2X/week     Co-evaluation               AM-PAC PT "6 Clicks" Mobility  Outcome Measure Help needed turning from your back to your side while in a flat bed without using bedrails?: A Little Help needed moving from lying on your back to sitting on the side of a flat bed without using bedrails?: A Lot Help needed moving to and from a bed to a chair (including a wheelchair)?: A Lot Help needed standing up from a chair using your arms (e.g., wheelchair or bedside chair)?: A Lot Help needed to walk in hospital room?: A Lot Help needed climbing 3-5 steps with a railing? : Total 6 Click Score: 12    End of Session Equipment Utilized During Treatment: Gait belt Activity Tolerance: Patient tolerated treatment well Patient left:  in chair;with call bell/phone within reach;with chair alarm set;with family/visitor present Nurse Communication: Mobility status PT Visit Diagnosis: Unsteadiness on feet (R26.81);History of falling (Z91.81);Muscle weakness (generalized) (M62.81);Difficulty in walking, not elsewhere classified (R26.2);Other symptoms and signs involving the nervous system (R29.898)    Time: [id number] PT Time Calculation (min) (ACUTE ONLY): 27 min   Charges:   PT Evaluation $PT Eval Low Complexity: 1 Low PT Treatments $Therapeutic Activity: 8-22 mins        Verner Mould, DPT Acute Rehabilitation Services Office  [phone] Pager [phone]  05/30/22 12:05 PM

## 2022-05-30 NOTE — TOC Initial Note (Signed)
Transition of Care [facility]) - Initial/Assessment Note    Patient Details  Name: Zachary [name] MRN: [id number] Date of Birth: 1951/07/01  [facility]) CM/SW Contact:    Otelia Santee, LCSW Phone Number: 05/30/2022, 1:51 PM  Clinical Narrative:                 Pt currently lives at [facility]. Pt has been receiving HHPT through [facility] and these services will continue at discharge. Pt will need resumption of care orders placed for HHPT. No DME needs identified.   Expected Discharge Plan: Home w Home Health Services Barriers to Discharge: No Barriers Identified   Patient Goals and CMS Choice Patient states their goals for this hospitalization and ongoing recovery are:: To return home   Choice offered to / list presented to : Patient  Expected Discharge Plan and Services Expected Discharge Plan: Home w Home Health Services In-house Referral: Clinical Social Work Discharge Planning Services: CM Consult Post Acute Care Choice: Home Health Living arrangements for the past 2 months: Independent Living Facility                 DME Arranged: N/A DME Agency: NA       HH Arranged: PT HH Agency: Other - See comment International aid/development worker) Date HH Agency Contacted: 05/30/22 Time HH Agency Contacted: 1351 Representative spoke with at [facility] Agency: Rene Kocher  Prior Living Arrangements/Services Living arrangements for the past 2 months: Marketing executive Lives with:: Spouse Patient language and need for interpreter reviewed:: Yes Do you feel safe going back to the place where you live?: Yes      Need for Family Participation in Patient Care: No (Comment) Care giver support system in place?: Yes (comment) Current home services: DME, Home PT Criminal Activity/Legal Involvement Pertinent to Current Situation/Hospitalization: No - Comment as needed  Activities of Daily Living Home Assistive Devices/Equipment: Environmental consultant (specify type) ADL Screening (condition at time of  admission) Patient's cognitive ability adequate to safely complete daily activities?: No Is the patient deaf or have difficulty hearing?: No Does the patient have difficulty seeing, even when wearing glasses/contacts?: No Does the patient have difficulty concentrating, remembering, or making decisions?: No Patient able to express need for assistance with ADLs?: Yes Does the patient have difficulty dressing or bathing?: Yes Independently performs ADLs?: No Communication: Independent Dressing (OT): Needs assistance Is this a change from baseline?: Pre-admission baseline Grooming: Needs assistance Is this a change from baseline?: Pre-admission baseline Feeding: Needs assistance Is this a change from baseline?: Pre-admission baseline Bathing: Needs assistance Is this a change from baseline?: Pre-admission baseline Toileting: Needs assistance Is this a change from baseline?: Pre-admission baseline In/Out Bed: Needs assistance Is this a change from baseline?: Pre-admission baseline Walks in Home: Needs assistance Is this a change from baseline?: Pre-admission baseline Does the patient have difficulty walking or climbing stairs?: Yes Weakness of Legs: Both Weakness of Arms/Hands: Both  Permission Sought/Granted   Permission granted to share information with : No              Emotional Assessment       Orientation: : Oriented to Self, Oriented to Place, Oriented to  Time, Oriented to Situation Alcohol / Substance Use: Not Applicable Psych Involvement: No (comment)  Admission diagnosis:  Hypokalemia [E87.6] Weakness [R53.1] CAP (community acquired pneumonia) [J18.9] Syncope, unspecified syncope type [R55] Community acquired pneumonia of right upper lobe of lung [J18.9] Patient Active Problem List   Diagnosis Date Noted   CAP (  community acquired pneumonia) 05/29/2022   Hypokalemia 05/29/2022   Moderate protein malnutrition (HCC) 05/29/2022   Hypercarbia 05/29/2022    Hypochloremia 05/29/2022   Hypophosphatemia 05/29/2022   Normocytic anemia 05/29/2022   Syncope 01/01/2022   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 [place] [address] Phone: [phone] Fax: [phone]  [facility] [address] [place], [place] - [address] [place] [address] Phone: [phone] Fax: [phone]     Social Determinants of Health (SDOH) Interventions    Readmission Risk Interventions    05/30/2022    1:49 PM  Readmission Risk Prevention Plan  Post Dischage Appt Complete  Medication Screening Complete  Transportation Screening Complete

## 2022-05-30 NOTE — Plan of Care (Signed)

## 2022-05-30 NOTE — Progress Notes (Signed)
PROGRESS NOTE  Zachary [name] AOZ:[id number] DOB: Mar 26, 1951 DOA: 05/29/2022 PCP: [name], MD   LOS: 1 day   Brief Narrative / Interim history: 71 y.o. male with medical history significant of seasonal allergies, asthma, BPH, chest pain episodes, frequent falls, Parkinson's disease, orthostatic hypotension, history of syncope who was brought via EMS to the emergency department due to losing consciousness.  The physical therapist at the facility said that the patient stare staring off and would not respond for 1 to 2 minutes.  He subsequently responded and stated that he was just very tired.  The patient stated that his legs have been very heavy for several weeks, his appetite is decreased and he has been coughing for the past 4 weeks.  Is not sure if he has had a fever, but has had chills and night sweats.  He has been sleeping more than usual.  Subjective / 24h Interval events: He is doing well.  Feels a little bit stronger  Assesement and Plan: Principal Problem:   CAP (community acquired pneumonia) Active Problems:   Parkinson's disease (HCC)   Orthostatic hypotension   Syncope   Hypokalemia   Moderate protein malnutrition (HCC)   Hypercarbia   Hypochloremia   Hypophosphatemia   Normocytic anemia   Principal problem Profound hypokalemia-continue to replace, potassium still low this morning.  Recheck tomorrow morning.  This is likely due to Florinef, dose was just increased.  Will need to go home on potassium supplementations  Active problems Community-acquired pneumonia, right upper lobe pneumonia-patient with respiratory symptoms and subjective fevers.  Continue antibiotics  Parkinson's disease-continue Sinemet.  PT eval pending  Orthostatic hypotension-continue midodrine and fludrocortisone  Syncope-2D echo pending  Hypochloremia-likely volume depletion.  Hypophosphatemia-replace  Moderate protein calorie malnutrition-continue supplements   Scheduled  Meds:  carbidopa-levodopa  1 tablet Oral QHS   entacapone  200 mg Oral TID   finasteride  5 mg Oral Daily   fludrocortisone  0.3 mg Oral BID   guaiFENesin  600 mg Oral BID   midodrine  5 mg Oral TID WC   potassium chloride  40 mEq Oral BID   sodium chloride flush  3 mL Intravenous Q12H   Continuous Infusions:  azithromycin     cefTRIAXone (ROCEPHIN)  IV     potassium chloride 10 mEq (05/30/22 0836)   PRN Meds:.acetaminophen **OR** acetaminophen, ondansetron **OR** ondansetron (ZOFRAN) IV  Diet Orders (From admission, onward)     Start     Ordered   05/29/22 1224  Diet Heart Room service appropriate? Yes; Fluid consistency: Thin  Diet effective now       Question Answer Comment  Room service appropriate? Yes   Fluid consistency: Thin      05/29/22 1225            DVT prophylaxis: SCDs Start: 05/29/22 1224   Lab Results  Component Value Date   PLT 308 05/30/2022      Code Status: Full Code  Family Communication: Wife present at bedside  Status is: Inpatient Remains inpatient appropriate because: Profound hypokalemia   Level of care: Telemetry  Objective: Vitals:   05/30/22 0051 05/30/22 0500 05/30/22 0609 05/30/22 0845  BP: (!) 145/92  (!) 146/93 (!) 90/55  Pulse: 71  71 67  Resp: 20  20   Temp: 98.8 F (37.1 C)  99.3 F (37.4 C) (!) 97.4 F (36.3 C)  TempSrc:    Oral  SpO2: 95%  97% 96%  Weight:  74.2 kg  Height:        Intake/Output Summary (Last 24 hours) at 05/30/2022 1026 Last data filed at 05/30/2022 0630 Gross per 24 hour  Intake 1179 ml  Output 2100 ml  Net -921 ml   Wt Readings from Last 3 Encounters:  05/30/22 74.2 kg  03/18/22 77.1 kg  01/18/22 79 kg    Examination:  Constitutional: NAD Eyes: no scleral icterus ENMT: Mucous membranes are moist.  Neck: normal, supple Respiratory: clear to auscultation bilaterally, no wheezing, no crackles. Normal respiratory effort. No accessory muscle use.  Cardiovascular: Regular rate and  rhythm, no murmurs / rubs / gallops. No LE edema.  Abdomen: non distended, no tenderness. Bowel sounds positive.  Musculoskeletal: no clubbing / cyanosis.  Skin: no rashes Neurologic: non focal   Data Reviewed: I have independently reviewed following labs and imaging studies   CBC Recent Labs  Lab 05/29/22 1027 05/30/22 0444  WBC 9.3 8.6  HGB 11.6* 11.2*  HCT 34.9* 34.1*  PLT 323 308  MCV 96.9 96.6  MCH 32.2 31.7  MCHC 33.2 32.8  RDW 11.7 11.8  LYMPHSABS 1.0  --   MONOABS 0.8  --   EOSABS 0.0  --   BASOSABS 0.0  --     Recent Labs  Lab 05/29/22 1027 05/29/22 1137 05/29/22 2043 05/30/22 0444  NA 141  --  139 142  K 2.1*  --  2.3* 2.7*  CL 97*  --  99 104  CO2 34*  --  33* 31  GLUCOSE 134*  --  105* 96  BUN 9  --  7* 6*  CREATININE 0.78  --  0.64 0.62  CALCIUM 7.9*  --  7.7* 7.9*  AST 15  --   --  13*  ALT 5  --   --  7  ALKPHOS 63  --   --  61  BILITOT 0.7  --   --  0.6  ALBUMIN 2.7*  --   --  2.6*  MG  --  2.0  --   --     ------------------------------------------------------------------------------------------------------------------ No results for input(s): "CHOL", "HDL", "LDLCALC", "TRIG", "CHOLHDL", "LDLDIRECT" in the last 72 hours.  No results found for: "HGBA1C" ------------------------------------------------------------------------------------------------------------------ No results for input(s): "TSH", "T4TOTAL", "T3FREE", "THYROIDAB" in the last 72 hours.  Invalid input(s): "FREET3"  Cardiac Enzymes No results for input(s): "CKMB", "TROPONINI", "MYOGLOBIN" in the last 168 hours.  Invalid input(s): "CK" ------------------------------------------------------------------------------------------------------------------ No results found for: "BNP"  CBG: Recent Labs  Lab 05/29/22 1019 05/30/22 0630  GLUCAP 137* 91    No results found for this or any previous visit (from the past 240 hour(s)).   Radiology Studies: CT Head Wo  Contrast  Result Date: 05/29/2022 CLINICAL DATA:  Syncopal episode EXAM: CT HEAD WITHOUT CONTRAST TECHNIQUE: Contiguous axial images were obtained from the base of the skull through the vertex without intravenous contrast. RADIATION DOSE REDUCTION: This exam was performed according to the departmental dose-optimization program which includes automated exposure control, adjustment of the mA and/or kV according to patient size and/or use of iterative reconstruction technique. COMPARISON:  03/18/2022 FINDINGS: Brain: No evidence of acute infarction, hemorrhage, mass, mass effect, or midline shift. No hydrocephalus or extra-axial fluid collection. Moderately advanced cerebral atrophy for age. Periventricular white matter changes, likely the sequela of chronic small vessel ischemic disease. Vascular: No hyperdense vessel. Skull: Normal. Negative for fracture or focal lesion. Sinuses/Orbits: Minimal mucosal thickening in the ethmoid air cells. Status post bilateral lens replacements. Other: The mastoid air cells  are well aerated. IMPRESSION: No acute intracranial process. Electronically Signed   By: Wiliam Ke M.D.   On: 05/29/2022 11:50   DG Chest Port 1 View  Result Date: 05/29/2022 CLINICAL DATA:  Syncope and weakness EXAM: PORTABLE CHEST 1 VIEW COMPARISON:  November 2013 FINDINGS: Right upper lobe opacity. Similar mild elevation of the right hemidiaphragm. Top-normal heart size. No pleural effusion or pneumothorax. IMPRESSION: Right upper lobe opacity suspicious for pneumonia. Follow-up recommended to document resolution. Electronically Signed   By: Guadlupe Spanish M.D.   On: 05/29/2022 10:48     Pamella Pert, MD, PhD [facility]  Between 7 am - 7 pm I am available, please contact me via Amion (for emergencies) or Securechat (non urgent messages)  Between 7 pm - 7 am I am not available, please contact night coverage MD/APP via Amion

## 2022-05-30 NOTE — Progress Notes (Signed)
*  PRELIMINARY RESULTS* Echocardiogram 2D Echocardiogram has been performed.  Zachary [name] 05/30/2022, 3:55 PM

## 2022-05-31 DIAGNOSIS — J189 Pneumonia, unspecified organism: Secondary | ICD-10-CM | POA: Diagnosis not present

## 2022-05-31 LAB — BASIC METABOLIC PANEL
Anion gap: 8 (ref 5–15)
BUN: 7 mg/dL — ABNORMAL LOW (ref 8–23)
CO2: 32 mmol/L (ref 22–32)
Calcium: 8 mg/dL — ABNORMAL LOW (ref 8.9–10.3)
Chloride: 102 mmol/L (ref 98–111)
Creatinine, Ser: 0.57 mg/dL — ABNORMAL LOW (ref 0.61–1.24)
GFR, Estimated: >60 mL/min (ref >60)
Glucose, Bld: 97 mg/dL (ref 70–99)
Potassium: 2.5 mmol/L — CL (ref 3.5–5.1)
Sodium: 142 mmol/L (ref 135–145)

## 2022-05-31 LAB — POTASSIUM: Potassium: 3.4 mmol/L — ABNORMAL LOW (ref 3.5–5.1)

## 2022-05-31 LAB — CBC
HCT: 34.8 % — ABNORMAL LOW (ref 39.0–52.0)
Hemoglobin: 11.5 g/dL — ABNORMAL LOW (ref 13.0–17.0)
MCH: 31.9 pg (ref 26.0–34.0)
MCHC: 33 g/dL (ref 30.0–36.0)
MCV: 96.7 fL (ref 80.0–100.0)
Platelets: 311 10*3/uL (ref 150–400)
RBC: 3.6 MIL/uL — ABNORMAL LOW (ref 4.22–5.81)
RDW: 11.7 % (ref 11.5–15.5)
WBC: 7.2 10*3/uL (ref 4.0–10.5)
nRBC: 0 % (ref 0.0–0.2)

## 2022-05-31 LAB — GLUCOSE, CAPILLARY: Glucose-Capillary: 97 mg/dL (ref 70–99)

## 2022-05-31 MED ORDER — POTASSIUM CHLORIDE CRYS ER 20 MEQ PO TBCR
40.0000 meq | EXTENDED_RELEASE_TABLET | ORAL | Status: AC
  Administered 2022-05-31 (×3): 40 meq via ORAL
  Filled 2022-05-31 (×3): qty 2

## 2022-05-31 MED ORDER — FLUDROCORTISONE ACETATE 0.1 MG PO TABS
0.1000 mg | ORAL_TABLET | Freq: Two times a day (BID) | ORAL | Status: DC
  Administered 2022-05-31 – 2022-06-01 (×3): 0.1 mg via ORAL
  Filled 2022-05-31 (×3): qty 1

## 2022-05-31 MED ORDER — POTASSIUM CHLORIDE CRYS ER 20 MEQ PO TBCR
40.0000 meq | EXTENDED_RELEASE_TABLET | ORAL | Status: AC
Start: 2022-05-31 — End: 2022-05-31
  Administered 2022-05-31 (×2): 40 meq via ORAL
  Filled 2022-05-31 (×2): qty 2

## 2022-05-31 MED ORDER — POTASSIUM CHLORIDE 10 MEQ/100ML IV SOLN
10.0000 meq | INTRAVENOUS | Status: AC
  Administered 2022-05-31 (×4): 10 meq via INTRAVENOUS
  Filled 2022-05-31 (×4): qty 100

## 2022-05-31 NOTE — Progress Notes (Signed)
PROGRESS NOTE  Zachary [name] GQQ:[id number] DOB: Mar 13, 1951 DOA: 05/29/2022 PCP: [name], MD   LOS: 2 days   Brief Narrative / Interim history: 71 y.o. male with medical history significant of seasonal allergies, asthma, BPH, chest pain episodes, frequent falls, Parkinson's disease, orthostatic hypotension, history of syncope who was brought via EMS to the emergency department due to losing consciousness.  The physical therapist at the facility said that the patient stare staring off and would not respond for 1 to 2 minutes.  He subsequently responded and stated that he was just very tired.  The patient stated that his legs have been very heavy for several weeks, his appetite is decreased and he has been coughing for the past 4 weeks.  Is not sure if he has had a fever, but has had chills and night sweats.  He has been sleeping more than usual.  Subjective / 24h Interval events: He is doing well.  Feels a little bit stronger  Assesement and Plan: Principal Problem:   CAP (community acquired pneumonia) Active Problems:   Parkinson's disease (HCC)   Orthostatic hypotension   Syncope   Hypokalemia   Moderate protein malnutrition (HCC)   Hypercarbia   Hypochloremia   Hypophosphatemia   Normocytic anemia   Principal problem Profound hypokalemia-potassium remains low this morning.  This is likely due to Florinef.  Decreased the dose today, he has been hypertensive to the point that RN yesterday had to hold the midodrine.  Continue to replace potassium aggressively.  Recheck in the afternoon.  Active problems Community-acquired pneumonia, right upper lobe pneumonia-patient with respiratory symptoms and subjective fevers.  Continue antibiotics.  Clinically improving.  Parkinson's disease-continue Sinemet.  PT evaluation pending  Orthostatic hypotension-continue midodrine and low-dose fludrocortisone  Syncope-2D echo showed EF 60-65%, no WMA, grade 2 diastolic dysfunction.  RV  was normal.  Hypochloremia-likely volume depletion.  Hypophosphatemia-replace  Moderate protein calorie malnutrition-continue supplements   Scheduled Meds:  azithromycin  500 mg Oral Daily   carbidopa-levodopa  1 tablet Oral QHS   carbidopa-levodopa  2 tablet Oral 6 times per day   carbidopa-levodopa  3 tablet Oral Q0600   entacapone  200 mg Oral TID   finasteride  5 mg Oral Daily   fludrocortisone  0.1 mg Oral BID   guaiFENesin  600 mg Oral BID   midodrine  5 mg Oral TID WC   potassium chloride  40 mEq Oral Q3H   sodium chloride flush  3 mL Intravenous Q12H   Continuous Infusions:  cefTRIAXone (ROCEPHIN)  IV 1 g (05/30/22 1320)   PRN Meds:.acetaminophen **OR** acetaminophen, ondansetron **OR** ondansetron (ZOFRAN) IV  Diet Orders (From admission, onward)     Start     Ordered   05/29/22 1224  Diet Heart Room service appropriate? Yes; Fluid consistency: Thin  Diet effective now       Question Answer Comment  Room service appropriate? Yes   Fluid consistency: Thin      05/29/22 1225            DVT prophylaxis: SCDs Start: 05/29/22 1224   Lab Results  Component Value Date   PLT 311 05/31/2022      Code Status: Full Code  Family Communication: Wife present at bedside  Status is: Inpatient Remains inpatient appropriate because: Profound hypokalemia   Level of care: Telemetry  Objective: Vitals:   05/30/22 1707 05/30/22 2003 05/30/22 2113 05/31/22 0402  BP: (!) 165/95 (!) 168/101 (!) 160/103 (!) 160/97  Pulse:  64 70 69 67  Resp:  18  20  Temp: 98.2 F (36.8 C) 98 F (36.7 C)  99.1 F (37.3 C)  TempSrc: Oral     SpO2: 100% 100%  95%  Weight:    74.5 kg  Height:        Intake/Output Summary (Last 24 hours) at 05/31/2022 1059 Last data filed at 05/31/2022 0947 Gross per 24 hour  Intake 934 ml  Output 4700 ml  Net -3766 ml    Wt Readings from Last 3 Encounters:  05/31/22 74.5 kg  03/18/22 77.1 kg  01/18/22 79 kg     Examination: Constitutional: NAD Eyes: lids and conjunctivae normal, no scleral icterus ENMT: mmm Neck: normal, supple Respiratory: clear to auscultation bilaterally, no wheezing, no crackles. Cardiovascular: Regular rate and rhythm, no murmurs / rubs / gallops.  Abdomen: soft, no distention, no tenderness.  Skin: no rashes  Data Reviewed: I have independently reviewed following labs and imaging studies   CBC Recent Labs  Lab 05/29/22 1027 05/30/22 0444 05/31/22 0449  WBC 9.3 8.6 7.2  HGB 11.6* 11.2* 11.5*  HCT 34.9* 34.1* 34.8*  PLT 323 308 311  MCV 96.9 96.6 96.7  MCH 32.2 31.7 31.9  MCHC 33.2 32.8 33.0  RDW 11.7 11.8 11.7  LYMPHSABS 1.0  --   --   MONOABS 0.8  --   --   EOSABS 0.0  --   --   BASOSABS 0.0  --   --      Recent Labs  Lab 05/29/22 1027 05/29/22 1137 05/29/22 2043 05/30/22 0444 05/31/22 0449  NA 141  --  139 142 142  K 2.1*  --  2.3* 2.7* 2.5*  CL 97*  --  99 104 102  CO2 34*  --  33* 31 32  GLUCOSE 134*  --  105* 96 97  BUN 9  --  7* 6* 7*  CREATININE 0.78  --  0.64 0.62 0.57*  CALCIUM 7.9*  --  7.7* 7.9* 8.0*  AST 15  --   --  13*  --   ALT 5  --   --  7  --   ALKPHOS 63  --   --  61  --   BILITOT 0.7  --   --  0.6  --   ALBUMIN 2.7*  --   --  2.6*  --   MG  --  2.0  --   --   --      ------------------------------------------------------------------------------------------------------------------ No results for input(s): "CHOL", "HDL", "LDLCALC", "TRIG", "CHOLHDL", "LDLDIRECT" in the last 72 hours.  No results found for: "HGBA1C" ------------------------------------------------------------------------------------------------------------------ No results for input(s): "TSH", "T4TOTAL", "T3FREE", "THYROIDAB" in the last 72 hours.  Invalid input(s): "FREET3"  Cardiac Enzymes No results for input(s): "CKMB", "TROPONINI", "MYOGLOBIN" in the last 168 hours.  Invalid input(s):  "CK" ------------------------------------------------------------------------------------------------------------------ No results found for: "BNP"  CBG: Recent Labs  Lab 05/29/22 1019 05/30/22 0630 05/31/22 0403  GLUCAP 137* 91 97     No results found for this or any previous visit (from the past 240 hour(s)).   Radiology Studies: ECHOCARDIOGRAM COMPLETE  Result Date: 05/30/2022    ECHOCARDIOGRAM REPORT   Patient Name:   Gustavo LahSTUART D Hulce Date of Exam: 05/30/2022 Medical Rec #:  [id number]      Height:       69.5 in Accession #:    [id number]     Weight:       163.6 lb Date of Birth:  08/09/1951      BSA:          1.906 m Patient Age:    71 years       BP:           142/84 mmHg Patient Gender: M              HR:           67 bpm. Exam Location:  Inpatient Procedure: 2D Echo, Cardiac Doppler and Color Doppler Indications:    Syncope  History:        Patient has no prior history of Echocardiogram examinations.                 Signs/Symptoms:Syncope. Parkiknson's disease.  Sonographer:    Mikki Harbor Referring Phys: [id number] DAVID MANUEL ORTIZ IMPRESSIONS  1. Left ventricular ejection fraction, by estimation, is 60 to 65%. The left ventricle has normal function. The left ventricle has no regional wall motion abnormalities. Left ventricular diastolic parameters are consistent with Grade II diastolic dysfunction (pseudonormalization).  2. Right ventricular systolic function is normal. The right ventricular size is normal. There is mildly elevated pulmonary artery systolic pressure. The estimated right ventricular systolic pressure is 37.2 mmHg.  3. Left atrial size was severely dilated.  4. Right atrial size was moderately dilated.  5. The mitral valve is normal in structure. Mild mitral valve regurgitation. No evidence of mitral stenosis.  6. The aortic valve is tricuspid. There is mild calcification of the aortic valve. Aortic valve regurgitation is not visualized. No aortic stenosis is present.  7.  The inferior vena cava is dilated in size with >50% respiratory variability, suggesting right atrial pressure of 8 mmHg. FINDINGS  Left Ventricle: Left ventricular ejection fraction, by estimation, is 60 to 65%. The left ventricle has normal function. The left ventricle has no regional wall motion abnormalities. The left ventricular internal cavity size was normal in size. There is  no left ventricular hypertrophy. Left ventricular diastolic parameters are consistent with Grade II diastolic dysfunction (pseudonormalization). Right Ventricle: The right ventricular size is normal. No increase in right ventricular wall thickness. Right ventricular systolic function is normal. There is mildly elevated pulmonary artery systolic pressure. The tricuspid regurgitant velocity is 2.70  m/s, and with an assumed right atrial pressure of 8 mmHg, the estimated right ventricular systolic pressure is 37.2 mmHg. Left Atrium: Left atrial size was severely dilated. Right Atrium: Right atrial size was moderately dilated. Pericardium: There is no evidence of pericardial effusion. Mitral Valve: The mitral valve is normal in structure. Mild mitral valve regurgitation. No evidence of mitral valve stenosis. MV peak gradient, 4.2 mmHg. The mean mitral valve gradient is 1.0 mmHg. Tricuspid Valve: The tricuspid valve is normal in structure. Tricuspid valve regurgitation is trivial. Aortic Valve: The aortic valve is tricuspid. There is mild calcification of the aortic valve. Aortic valve regurgitation is not visualized. No aortic stenosis is present. Aortic valve mean gradient measures 5.0 mmHg. Aortic valve peak gradient measures 10.8 mmHg. Aortic valve area, by VTI measures 2.85 cm. Pulmonic Valve: The pulmonic valve was normal in structure. Pulmonic valve regurgitation is not visualized. Aorta: The aortic root is normal in size and structure. Venous: The inferior vena cava is dilated in size with greater than 50% respiratory variability,  suggesting right atrial pressure of 8 mmHg. IAS/Shunts: No atrial level shunt detected by color flow Doppler.  LEFT VENTRICLE PLAX 2D LVIDd:         4.80 cm  Diastology LVIDs:         3.20 cm     LV e' medial:    8.16 cm/s LV PW:         1.40 cm     LV E/e' medial:  14.0 LV IVS:        1.10 cm     LV e' lateral:   11.90 cm/s LVOT diam:     2.00 cm     LV E/e' lateral: 9.6 LV SV:         95 LV SV Index:   50 LVOT Area:     3.14 cm  LV Volumes (MOD) LV vol d, MOD A2C: 62.0 ml LV vol d, MOD A4C: 86.9 ml LV vol s, MOD A2C: 32.6 ml LV vol s, MOD A4C: 38.2 ml LV SV MOD A2C:     29.4 ml LV SV MOD A4C:     86.9 ml LV SV MOD BP:      38.7 ml RIGHT VENTRICLE RV Basal diam:  3.70 cm RV Mid diam:    3.00 cm RV S prime:     12.50 cm/s TAPSE (M-mode): 3.0 cm LEFT ATRIUM              Index        RIGHT ATRIUM           Index LA diam:        4.40 cm  2.31 cm/m   RA Area:     23.20 cm LA Vol (A2C):   118.0 ml 61.90 ml/m  RA Volume:   68.80 ml  36.09 ml/m LA Vol (A4C):   94.0 ml  49.31 ml/m LA Biplane Vol: 113.0 ml 59.28 ml/m  AORTIC VALVE                     PULMONIC VALVE AV Area (Vmax):    2.51 cm      PV Vmax:       0.79 m/s AV Area (Vmean):   2.91 cm      PV Peak grad:  2.5 mmHg AV Area (VTI):     2.85 cm AV Vmax:           164.00 cm/s AV Vmean:          102.000 cm/s AV VTI:            0.333 m AV Peak Grad:      10.8 mmHg AV Mean Grad:      5.0 mmHg LVOT Vmax:         131.00 cm/s LVOT Vmean:        94.400 cm/s LVOT VTI:          0.302 m LVOT/AV VTI ratio: 0.91  AORTA Ao Root diam: 3.60 cm MITRAL VALVE                TRICUSPID VALVE MV Area (PHT): 3.77 cm     TR Peak grad:   29.2 mmHg MV Area VTI:   3.35 cm     TR Vmax:        270.00 cm/s MV Peak grad:  4.2 mmHg MV Mean grad:  1.0 mmHg     SHUNTS MV Vmax:       1.03 m/s     Systemic VTI:  0.30 m MV Vmean:      46.4 cm/s    Systemic Diam: 2.00 cm MV Decel Time: 201 msec MV E velocity: 114.00 cm/s MV  A velocity: 52.70 cm/s MV E/A ratio:  2.16 Dalton McleanMD  Electronically signed by Wilfred Lacy Signature Date/Time: 05/30/2022/7:52:55 PM    Final      Pamella Pert, MD, PhD [facility]  Between 7 am - 7 pm I am available, please contact me via Amion (for emergencies) or Securechat (non urgent messages)  Between 7 pm - 7 am I am not available, please contact night coverage MD/APP via Amion

## 2022-06-01 DIAGNOSIS — E876 Hypokalemia: Secondary | ICD-10-CM | POA: Diagnosis not present

## 2022-06-01 DIAGNOSIS — J189 Pneumonia, unspecified organism: Secondary | ICD-10-CM | POA: Diagnosis not present

## 2022-06-01 LAB — GLUCOSE, CAPILLARY: Glucose-Capillary: 93 mg/dL (ref 70–99)

## 2022-06-01 LAB — BASIC METABOLIC PANEL
Anion gap: 8 (ref 5–15)
BUN: 9 mg/dL (ref 8–23)
CO2: 32 mmol/L (ref 22–32)
Calcium: 8.4 mg/dL — ABNORMAL LOW (ref 8.9–10.3)
Chloride: 101 mmol/L (ref 98–111)
Creatinine, Ser: 0.61 mg/dL (ref 0.61–1.24)
GFR, Estimated: >60 mL/min (ref >60)
Glucose, Bld: 96 mg/dL (ref 70–99)
Potassium: 3.1 mmol/L — ABNORMAL LOW (ref 3.5–5.1)
Sodium: 141 mmol/L (ref 135–145)

## 2022-06-01 MED ORDER — POTASSIUM CHLORIDE CRYS ER 20 MEQ PO TBCR: 40.0000 meq | EXTENDED_RELEASE_TABLET | Freq: Two times a day (BID) | ORAL | 0 refills | Status: DC

## 2022-06-01 MED ORDER — AMOXICILLIN-POT CLAVULANATE 875-125 MG PO TABS: 1.0000 | ORAL_TABLET | Freq: Two times a day (BID) | ORAL | 0 refills | Status: AC

## 2022-06-01 MED ORDER — FLUDROCORTISONE ACETATE 0.1 MG PO TABS: 0.1000 mg | ORAL_TABLET | Freq: Two times a day (BID) | ORAL | 3 refills | Status: AC

## 2022-06-01 MED ORDER — POTASSIUM CHLORIDE CRYS ER 20 MEQ PO TBCR
40.0000 meq | EXTENDED_RELEASE_TABLET | ORAL | Status: AC
  Administered 2022-06-01 (×3): 40 meq via ORAL
  Filled 2022-06-01 (×3): qty 2

## 2022-06-01 NOTE — Progress Notes (Signed)
PT Cancellation Note  Patient Details Name: Zachary [name] MRN: [id number] DOB: March 29, 1951   Cancelled Treatment:    Reason Eval/Treat Not Completed: Other (comment). Pt scheduled for d/c. Spoke to pt and family, but he was waiting to get cleaned up.  Nurse Tech entering room.  Will check back as schedule permits.   Enzo Montgomery 06/01/2022, 12:28 PM

## 2022-06-01 NOTE — Discharge Instructions (Addendum)
Please recheck a potassium on Monday 06/04/2022.  Florinef dose was decreased to 0.1 mg twice daily. If low potassium continues to be a problem consider stopping Florinef and increasing Midodrine dose  Follow with [name], MD in 5-7 days  Please get a complete blood count and chemistry panel checked by your Primary MD at your next visit, and again as instructed by your Primary MD. Please get your medications reviewed and adjusted by your Primary MD.  Please request your Primary MD to go over all Hospital Tests and Procedure/Radiological results at the follow up, please get all Hospital records sent to your Prim MD by signing hospital release before you go home.  In some cases, there will be blood work, cultures and biopsy results pending at the time of your discharge. Please request that your primary care M.D. goes through all the records of your hospital data and follows up on these results.  If you had Pneumonia of Lung problems at the Hospital: Please get a 2 view Chest X ray done in 6-8 weeks after hospital discharge or sooner if instructed by your Primary MD.  If you have Congestive Heart Failure: Please call your Cardiologist or Primary MD anytime you have any of the following symptoms:  1) 3 pound weight gain in 24 hours or 5 pounds in 1 week  2) shortness of breath, with or without a dry hacking cough  3) swelling in the hands, feet or stomach  4) if you have to sleep on extra pillows at night in order to breathe  Follow cardiac low salt diet and 1.5 lit/day fluid restriction.  If you have diabetes Accuchecks 4 times/day, Once in AM empty stomach and then before each meal. Log in all results and show them to your primary doctor at your next visit. If any glucose reading is under 80 or above 300 call your primary MD immediately.  If you have Seizure/Convulsions/Epilepsy: Please do not drive, operate heavy machinery, participate in activities at heights or participate in  high speed sports until you have seen by Primary MD or a Neurologist and advised to do so again. Per [facility] statutes, patients with seizures are not allowed to drive until they have been seizure-free for six months.  Use caution when using heavy equipment or power tools. Avoid working on ladders or at heights. Take showers instead of baths. Ensure the water temperature is not too high on the home water heater. Do not go swimming alone. Do not lock yourself in a room alone (i.e. bathroom). When caring for infants or small children, sit down when holding, feeding, or changing them to minimize risk of injury to the child in the event you have a seizure. Maintain good sleep hygiene. Avoid alcohol.   If you had Gastrointestinal Bleeding: Please ask your Primary MD to check a complete blood count within one week of discharge or at your next visit. Your endoscopic/colonoscopic biopsies that are pending at the time of discharge, will also need to followed by your Primary MD.  Get Medicines reviewed and adjusted. Please take all your medications with you for your next visit with your Primary MD  Please request your Primary MD to go over all hospital tests and procedure/radiological results at the follow up, please ask your Primary MD to get all Hospital records sent to his/her office.  If you experience worsening of your admission symptoms, develop shortness of breath, life threatening emergency, suicidal or homicidal thoughts you must seek medical  attention immediately by calling 911 or calling your MD immediately  if symptoms less severe.  You must read complete instructions/literature along with all the possible adverse reactions/side effects for all the Medicines you take and that have been prescribed to you. Take any new Medicines after you have completely understood and accpet all the possible adverse reactions/side effects.   Do not drive or operate heavy machinery when taking Pain  medications.   Do not take more than prescribed Pain, Sleep and Anxiety Medications  Special Instructions: If you have smoked or chewed Tobacco  in the last 2 yrs please stop smoking, stop any regular Alcohol  and or any Recreational drug use.  Wear Seat belts while driving.  Please note You were cared for by a hospitalist during your hospital stay. If you have any questions about your discharge medications or the care you received while you were in the hospital after you are discharged, you can call the unit and asked to speak with the hospitalist on call if the hospitalist that took care of you is not available. Once you are discharged, your primary care physician will handle any further medical issues. Please note that NO REFILLS for any discharge medications will be authorized once you are discharged, as it is imperative that you return to your primary care physician (or establish a relationship with a primary care physician if you do not have one) for your aftercare needs so that they can reassess your need for medications and monitor your lab values.  You can reach the hospitalist office at phone [phone] or fax [phone]   If you do not have a primary care physician, you can call [phone] for a physician referral.  Activity: As tolerated with Full fall precautions use walker/cane & assistance as needed    Diet: regular  Disposition Home

## 2022-06-01 NOTE — Discharge Summary (Signed)
Physician Discharge Summary  KEMANI DEMARAIS DPO:[id number] DOB: 12/23/1950 DOA: 05/29/2022  PCP: [name], MD  Admit date: 05/29/2022 Discharge date: 06/01/2022  Admitted From: ALF - [facility] Disposition:  ALF  Recommendations for Outpatient Follow-up:  Follow up with PCP in 1-2 weeks Please obtain BMP in 3 days  Home Health: PT Equipment/Devices: none  Discharge Condition: stable CODE STATUS: Full code Diet Orders (From admission, onward)     Start     Ordered   05/29/22 1224  Diet Heart Room service appropriate? Yes; Fluid consistency: Thin  Diet effective now       Question Answer Comment  Room service appropriate? Yes   Fluid consistency: Thin      05/29/22 1225            HPI: Per admitting MD, Zachary [name] is a 71 y.o. male with medical history significant of seasonal allergies, asthma, BPH, chest pain episodes, frequent falls, Parkinson's disease, orthostatic hypotension, history of syncope who was brought via EMS to the emergency department due to losing consciousness.  The physical therapist at the facility said that the patient stare staring off and would not respond for 1 to 2 minutes.  He subsequently responded and stated that he was just very tired.  The patient stated that his legs have been very heavy for several weeks, his appetite is decreased and he has been coughing for the past 4 weeks.  Is not sure if he has had a fever, but has had chills and night sweats.  He has been sleeping more than usual. He denied rhinorrhea, sore throat, wheezing or hemoptysis.  No chest pain, palpitations, diaphoresis, PND, orthopnea or pitting edema of the lower extremities.  No abdominal pain, nausea, emesis, diarrhea, melena or hematochezia.  No flank pain, dysuria, frequency or hematuria.  No polyuria, polydipsia, polyphagia or blurred vision.   Hospital Course / Discharge diagnoses: Principal Problem:   CAP (community acquired pneumonia) Active Problems:    Parkinson's disease (HCC)   Orthostatic hypotension   Syncope   Hypokalemia   Moderate protein malnutrition (HCC)   Hypercarbia   Hypochloremia   Hypophosphatemia   Normocytic anemia   Principal problem Profound hypokalemia-patient was admitted to the hospital with profound hypokalemia, likely due to recent adjustments of his home Florinef.  His magnesium was normal.  He did not have any GI losses, no diarrhea, nausea or vomiting.  He required extensive supplementation, eventually potassium improved.  His Florinef dose has been decreased from 0.3 mg twice daily to 0.1 mg twice daily.  Potassium is 3.1 on the day of discharge, repleted to 4 prior to him leaving the hospital.  He will be placed on slightly higher dose of potassium supplementation over the weekend to correspond to how much potassium here in the hospital, and was advised to follow-up and get blood work done in 3 days, on Monday 8/7 or Tuesday at the latest.  If hypokalemia continues to remain a problem could consider discontinuing Florinef and increasing midodrine dose   Active problems Community-acquired pneumonia, right upper lobe pneumonia-patient with respiratory symptoms and subjective fevers.  He was placed on antibiotics with ceftriaxone azithromycin, improved, returned to baseline.  Will transition to Augmentin for 3 additional days to complete a 5-day course Parkinson's disease-continue home medication outpatient neurology follow-up Orthostatic hypotension-continue midodrine and low-dose fludrocortisone for now, see discussion above. Syncope-2D echo showed EF 60-65%, no WMA, grade 2 diastolic dysfunction.  RV was normal. Hypochloremia-likely volume depletion.  Hypophosphatemia-replaced Moderate protein calorie malnutrition-continue supplements  Sepsis ruled out   Discharge Instructions   Allergies as of 06/01/2022       Reactions   Pear Other (See Comments)   Unknown Not listed on MAR   Tree Extract Other (See  Comments)   Unknown Not listed on MAR        Medication List     STOP taking these medications    tamsulosin 0.4 MG Caps capsule Commonly known as: FLOMAX       TAKE these medications    AMBULATORY NON FORMULARY MEDICATION Transport chair Dx: Parkinsons Disease G20   amoxicillin-clavulanate 875-125 MG tablet Commonly known as: AUGMENTIN Take 1 tablet by mouth 2 (two) times daily for 3 days.   carbidopa-levodopa 25-100 MG tablet Commonly known as: SINEMET IR 3 tabs AT 6:30AM/2 at 8:30AM/2 at 10:30AM/2 at 12:30PM/2 at 2:30PM/2 at 4:30pm/2 at 6:30pm. take up to extra tablet (in 1/2 tabs) per day AS NEEDED What changed: See the new instructions.   carbidopa-levodopa 50-200 MG tablet Commonly known as: SINEMET CR TAKE ONE TABLET BY MOUTH AT BEDTIME What changed: Another medication with the same name was changed. Make sure you understand how and when to take each.   DIALYVITE VITAMIN D 5000 PO Take 1 tablet by mouth daily.   entacapone 200 MG tablet Commonly known as: COMTAN Take 1 tablet (200 mg total) by mouth 3 (three) times daily (8:30/12:30/4:30). What changed: See the new instructions.   finasteride 5 MG tablet Commonly known as: PROSCAR Take 5 mg by mouth daily.   fludrocortisone 0.1 MG tablet Commonly known as: FLORINEF Take 1 tablet (0.1 mg total) by mouth 2 (two) times daily. What changed: how much to take   midodrine 5 MG tablet Commonly known as: PROAMATINE TAKE ONE TABLET BY MOUTH THREE TIMES A DAILY WITH MEALS What changed:  how much to take how to take this when to take this additional instructions   potassium chloride SA 20 MEQ tablet Commonly known as: KLOR-CON M Take 2 tablets (40 mEq total) by mouth 2 (two) times daily for 3 days.   PRESERVISION AREDS 2 PO Take 1 tablet by mouth daily.   PROBIOTIC ACIDOPHILUS PO Take 1 tablet by mouth daily.        Follow-up Information     Cleatis PolkaShaw, William D Jr., MD Follow up in 1 week(s).    Specialty: Internal Medicine Contact information: [address] [place] [address] [phone]                 Consultations: none   ECHOCARDIOGRAM COMPLETE  Result Date: 05/30/2022    ECHOCARDIOGRAM REPORT   Patient Name:   Gustavo LahSTUART D Dykes Date of Exam: 05/30/2022 Medical Rec #:  [id number]      Height:       69.5 in Accession #:    [id number]     Weight:       163.6 lb Date of Birth:  1951-07-12      BSA:          1.906 m Patient Age:    71 years       BP:           142/84 mmHg Patient Gender: M              HR:           67 bpm. Exam Location:  Inpatient Procedure: 2D Echo, Cardiac Doppler and Color Doppler Indications:    Syncope  History:        Patient has no prior history of Echocardiogram examinations.                 Signs/Symptoms:Syncope. Parkiknson's disease.  Sonographer:    Mikki Harbor Referring Phys: [id number] DAVID MANUEL ORTIZ IMPRESSIONS  1. Left ventricular ejection fraction, by estimation, is 60 to 65%. The left ventricle has normal function. The left ventricle has no regional wall motion abnormalities. Left ventricular diastolic parameters are consistent with Grade II diastolic dysfunction (pseudonormalization).  2. Right ventricular systolic function is normal. The right ventricular size is normal. There is mildly elevated pulmonary artery systolic pressure. The estimated right ventricular systolic pressure is 37.2 mmHg.  3. Left atrial size was severely dilated.  4. Right atrial size was moderately dilated.  5. The mitral valve is normal in structure. Mild mitral valve regurgitation. No evidence of mitral stenosis.  6. The aortic valve is tricuspid. There is mild calcification of the aortic valve. Aortic valve regurgitation is not visualized. No aortic stenosis is present.  7. The inferior vena cava is dilated in size with >50% respiratory variability, suggesting right atrial pressure of 8 mmHg. FINDINGS  Left Ventricle: Left ventricular ejection fraction, by  estimation, is 60 to 65%. The left ventricle has normal function. The left ventricle has no regional wall motion abnormalities. The left ventricular internal cavity size was normal in size. There is  no left ventricular hypertrophy. Left ventricular diastolic parameters are consistent with Grade II diastolic dysfunction (pseudonormalization). Right Ventricle: The right ventricular size is normal. No increase in right ventricular wall thickness. Right ventricular systolic function is normal. There is mildly elevated pulmonary artery systolic pressure. The tricuspid regurgitant velocity is 2.70  m/s, and with an assumed right atrial pressure of 8 mmHg, the estimated right ventricular systolic pressure is 37.2 mmHg. Left Atrium: Left atrial size was severely dilated. Right Atrium: Right atrial size was moderately dilated. Pericardium: There is no evidence of pericardial effusion. Mitral Valve: The mitral valve is normal in structure. Mild mitral valve regurgitation. No evidence of mitral valve stenosis. MV peak gradient, 4.2 mmHg. The mean mitral valve gradient is 1.0 mmHg. Tricuspid Valve: The tricuspid valve is normal in structure. Tricuspid valve regurgitation is trivial. Aortic Valve: The aortic valve is tricuspid. There is mild calcification of the aortic valve. Aortic valve regurgitation is not visualized. No aortic stenosis is present. Aortic valve mean gradient measures 5.0 mmHg. Aortic valve peak gradient measures 10.8 mmHg. Aortic valve area, by VTI measures 2.85 cm. Pulmonic Valve: The pulmonic valve was normal in structure. Pulmonic valve regurgitation is not visualized. Aorta: The aortic root is normal in size and structure. Venous: The inferior vena cava is dilated in size with greater than 50% respiratory variability, suggesting right atrial pressure of 8 mmHg. IAS/Shunts: No atrial level shunt detected by color flow Doppler.  LEFT VENTRICLE PLAX 2D LVIDd:         4.80 cm     Diastology LVIDs:          3.20 cm     LV e' medial:    8.16 cm/s LV PW:         1.40 cm     LV E/e' medial:  14.0 LV IVS:        1.10 cm     LV e' lateral:   11.90 cm/s LVOT diam:     2.00 cm     LV E/e' lateral: 9.6 LV SV:  95 LV SV Index:   50 LVOT Area:     3.14 cm  LV Volumes (MOD) LV vol d, MOD A2C: 62.0 ml LV vol d, MOD A4C: 86.9 ml LV vol s, MOD A2C: 32.6 ml LV vol s, MOD A4C: 38.2 ml LV SV MOD A2C:     29.4 ml LV SV MOD A4C:     86.9 ml LV SV MOD BP:      38.7 ml RIGHT VENTRICLE RV Basal diam:  3.70 cm RV Mid diam:    3.00 cm RV S prime:     12.50 cm/s TAPSE (M-mode): 3.0 cm LEFT ATRIUM              Index        RIGHT ATRIUM           Index LA diam:        4.40 cm  2.31 cm/m   RA Area:     23.20 cm LA Vol (A2C):   118.0 ml 61.90 ml/m  RA Volume:   68.80 ml  36.09 ml/m LA Vol (A4C):   94.0 ml  49.31 ml/m LA Biplane Vol: 113.0 ml 59.28 ml/m  AORTIC VALVE                     PULMONIC VALVE AV Area (Vmax):    2.51 cm      PV Vmax:       0.79 m/s AV Area (Vmean):   2.91 cm      PV Peak grad:  2.5 mmHg AV Area (VTI):     2.85 cm AV Vmax:           164.00 cm/s AV Vmean:          102.000 cm/s AV VTI:            0.333 m AV Peak Grad:      10.8 mmHg AV Mean Grad:      5.0 mmHg LVOT Vmax:         131.00 cm/s LVOT Vmean:        94.400 cm/s LVOT VTI:          0.302 m LVOT/AV VTI ratio: 0.91  AORTA Ao Root diam: 3.60 cm MITRAL VALVE                TRICUSPID VALVE MV Area (PHT): 3.77 cm     TR Peak grad:   29.2 mmHg MV Area VTI:   3.35 cm     TR Vmax:        270.00 cm/s MV Peak grad:  4.2 mmHg MV Mean grad:  1.0 mmHg     SHUNTS MV Vmax:       1.03 m/s     Systemic VTI:  0.30 m MV Vmean:      46.4 cm/s    Systemic Diam: 2.00 cm MV Decel Time: 201 msec MV E velocity: 114.00 cm/s MV A velocity: 52.70 cm/s MV E/A ratio:  2.16 Dalton McleanMD Electronically signed by Wilfred Lacy Signature Date/Time: 05/30/2022/7:52:55 PM    Final    CT Head Wo Contrast  Result Date: 05/29/2022 CLINICAL DATA:  Syncopal episode EXAM: CT HEAD  WITHOUT CONTRAST TECHNIQUE: Contiguous axial images were obtained from the base of the skull through the vertex without intravenous contrast. RADIATION DOSE REDUCTION: This exam was performed according to the departmental dose-optimization program which includes automated exposure control, adjustment of the mA and/or kV according to patient size and/or  use of iterative reconstruction technique. COMPARISON:  03/18/2022 FINDINGS: Brain: No evidence of acute infarction, hemorrhage, mass, mass effect, or midline shift. No hydrocephalus or extra-axial fluid collection. Moderately advanced cerebral atrophy for age. Periventricular white matter changes, likely the sequela of chronic small vessel ischemic disease. Vascular: No hyperdense vessel. Skull: Normal. Negative for fracture or focal lesion. Sinuses/Orbits: Minimal mucosal thickening in the ethmoid air cells. Status post bilateral lens replacements. Other: The mastoid air cells are well aerated. IMPRESSION: No acute intracranial process. Electronically Signed   By: Wiliam Zachary M.D.   On: 05/29/2022 11:50   DG Chest Port 1 View  Result Date: 05/29/2022 CLINICAL DATA:  Syncope and weakness EXAM: PORTABLE CHEST 1 VIEW COMPARISON:  November 2013 FINDINGS: Right upper lobe opacity. Similar mild elevation of the right hemidiaphragm. Top-normal heart size. No pleural effusion or pneumothorax. IMPRESSION: Right upper lobe opacity suspicious for pneumonia. Follow-up recommended to document resolution. Electronically Signed   By: Guadlupe Spanish M.D.   On: 05/29/2022 10:48     Subjective: - no chest pain, shortness of breath, no abdominal pain, nausea or vomiting.   Discharge Exam: BP (!) 147/97 (BP Location: Left Arm)   Pulse 62   Temp 97.7 F (36.5 C) (Oral)   Resp 18   Ht 5' 9.5" (1.765 m)   Wt 75 kg   SpO2 97%   BMI 24.07 kg/m   General: Pt is alert, awake, not in acute distress Extremities: no edema, no cyanosis    The results of significant  diagnostics from this hospitalization (including imaging, microbiology, ancillary and laboratory) are listed below for reference.     Microbiology: No results found for this or any previous visit (from the past 240 hour(s)).   Labs: Basic Metabolic Panel: Recent Labs  Lab 05/29/22 1027 05/29/22 1137 05/29/22 2043 05/30/22 0444 05/31/22 0449 05/31/22 1559 06/01/22 0414  NA 141  --  139 142 142  --  141  K 2.1*  --  2.3* 2.7* 2.5* 3.4* 3.1*  CL 97*  --  99 104 102  --  101  CO2 34*  --  33* 31 32  --  32  GLUCOSE 134*  --  105* 96 97  --  96  BUN 9  --  7* 6* 7*  --  9  CREATININE 0.78  --  0.64 0.62 0.57*  --  0.61  CALCIUM 7.9*  --  7.7* 7.9* 8.0*  --  8.4*  MG  --  2.0  --   --   --   --   --   PHOS  --  2.4*  --   --   --   --   --    Liver Function Tests: Recent Labs  Lab 05/29/22 1027 05/30/22 0444  AST 15 13*  ALT 5 7  ALKPHOS 63 61  BILITOT 0.7 0.6  PROT 6.5 6.2*  ALBUMIN 2.7* 2.6*   CBC: Recent Labs  Lab 05/29/22 1027 05/30/22 0444 05/31/22 0449  WBC 9.3 8.6 7.2  NEUTROABS 7.4  --   --   HGB 11.6* 11.2* 11.5*  HCT 34.9* 34.1* 34.8*  MCV 96.9 96.6 96.7  PLT 323 308 311   CBG: Recent Labs  Lab 05/29/22 1019 05/30/22 0630 05/31/22 0403 06/01/22 0535  GLUCAP 137* 91 97 93   Hgb A1c No results for input(s): "HGBA1C" in the last 72 hours. Lipid Profile No results for input(s): "CHOL", "HDL", "LDLCALC", "TRIG", "CHOLHDL", "LDLDIRECT" in the last 72  hours. Thyroid function studies No results for input(s): "TSH", "T4TOTAL", "T3FREE", "THYROIDAB" in the last 72 hours.  Invalid input(s): "FREET3" Urinalysis    Component Value Date/Time   COLORURINE AMBER (A) 05/29/2022 1024   APPEARANCEUR CLEAR 05/29/2022 1024   LABSPEC 1.011 05/29/2022 1024   PHURINE 7.0 05/29/2022 1024   GLUCOSEU NEGATIVE 05/29/2022 1024   HGBUR NEGATIVE 05/29/2022 1024   BILIRUBINUR NEGATIVE 05/29/2022 1024   KETONESUR NEGATIVE 05/29/2022 1024   PROTEINUR NEGATIVE  05/29/2022 1024   NITRITE NEGATIVE 05/29/2022 1024   LEUKOCYTESUR NEGATIVE 05/29/2022 1024    FURTHER DISCHARGE INSTRUCTIONS:   Get Medicines reviewed and adjusted: Please take all your medications with you for your next visit with your Primary MD   Laboratory/radiological data: Please request your Primary MD to go over all hospital tests and procedure/radiological results at the follow up, please ask your Primary MD to get all Hospital records sent to his/her office.   In some cases, they will be blood work, cultures and biopsy results pending at the time of your discharge. Please request that your primary care M.D. goes through all the records of your hospital data and follows up on these results.   Also Note the following: If you experience worsening of your admission symptoms, develop shortness of breath, life threatening emergency, suicidal or homicidal thoughts you must seek medical attention immediately by calling 911 or calling your MD immediately  if symptoms less severe.   You must read complete instructions/literature along with all the possible adverse reactions/side effects for all the Medicines you take and that have been prescribed to you. Take any new Medicines after you have completely understood and accpet all the possible adverse reactions/side effects.    Do not drive when taking Pain medications or sleeping medications (Benzodaizepines)   Do not take more than prescribed Pain, Sleep and Anxiety Medications. It is not advisable to combine anxiety,sleep and pain medications without talking with your primary care practitioner   Special Instructions: If you have smoked or chewed Tobacco  in the last 2 yrs please stop smoking, stop any regular Alcohol  and or any Recreational drug use.   Wear Seat belts while driving.   Please note: You were cared for by a hospitalist during your hospital stay. Once you are discharged, your primary care physician will handle any further  medical issues. Please note that NO REFILLS for any discharge medications will be authorized once you are discharged, as it is imperative that you return to your primary care physician (or establish a relationship with a primary care physician if you do not have one) for your post hospital discharge needs so that they can reassess your need for medications and monitor your lab values.  Time coordinating discharge: 35 minutes  SIGNED:  Pamella Pert, MD, PhD 06/01/2022, 8:08 AM

## 2022-06-01 NOTE — [place] FL2 (Signed)
[place] MEDICAID FL2 LEVEL OF CARE SCREENING TOOL     IDENTIFICATION  Patient Name: Zachary [name] Birthdate: 08-21-1951 Sex: male Admission Date (Current Location): 05/29/2022  [facility] and [place] Number:  Producer, television/film/video and Address:  [facility],  [address] [place], [place] [address]      Provider Number: [id number]  Attending Physician Name and Address:  [name], MD  Relative Name and Phone Number:  Spouse Dolores Mcgovern    Current Level of Care: Hospital Recommended Level of Care: Assisted Living Facility Prior Approval Number:    Date Approved/Denied:   PASRR Number:    Discharge Plan: Other (Comment) (ALF)    Current Diagnoses: Patient Active Problem List   Diagnosis Date Noted   CAP (community acquired pneumonia) 05/29/2022   Hypokalemia 05/29/2022   Moderate protein malnutrition (HCC) 05/29/2022   Hypercarbia 05/29/2022   Hypochloremia 05/29/2022   Hypophosphatemia 05/29/2022   Normocytic anemia 05/29/2022   Syncope 01/01/2022   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   Frequent falls    Orthostatic hypotension 03/02/2021   Hearing loss 04/29/2019   Nocturia 04/15/2018   Constipation 04/03/2017   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   Parkinson's disease (HCC) 06/1999    Orientation RESPIRATION BLADDER Height & Weight     Self, Time, Situation, Place  Normal Continent, Incontinent, External catheter Weight: 165 lb 5.5 oz (75 kg) Height:  5' 9.5" (176.5 cm)  BEHAVIORAL SYMPTOMS/MOOD NEUROLOGICAL BOWEL NUTRITION STATUS      Continent Diet (Regular)  AMBULATORY STATUS COMMUNICATION OF NEEDS Skin   Limited Assist Verbally Normal                       Personal Care Assistance Level of Assistance  Bathing, Feeding, Dressing Bathing Assistance: Limited assistance Feeding assistance: Independent Dressing Assistance: Limited assistance     Functional Limitations Info   Sight, Hearing, Speech Sight Info: Adequate Hearing Info: Adequate Speech Info: Adequate    SPECIAL CARE FACTORS FREQUENCY  PT (By licensed PT), OT (By licensed OT), Speech therapy                    Contractures Contractures Info: Not present    Additional Factors Info  Code Status, Allergies Code Status Info: FULL Allergies Info: Pear, Tree Extract           Current Medications (06/01/2022):  This is the current hospital active medication list Current Facility-Administered Medications  Medication Dose Route Frequency Provider Last Rate Last Admin   acetaminophen (TYLENOL) tablet 650 mg  650 mg Oral Q6H PRN Bobette Mo, MD       Or   acetaminophen (TYLENOL) suppository 650 mg  650 mg Rectal Q6H PRN Bobette Mo, MD       azithromycin [facility]) tablet 500 mg  500 mg Oral Daily Danford Bad, RPH   500 mg at 06/01/22 5638   carbidopa-levodopa (SINEMET CR) 50-200 MG per tablet controlled release 1 tablet  1 tablet Oral QHS Bobette Mo, MD   1 tablet at 05/31/22 2153   carbidopa-levodopa (SINEMET IR) 25-100 MG per tablet immediate release 2 tablet  2 tablet Oral 6 times per day [name], MD   2 tablet at 06/01/22 0917   carbidopa-levodopa (SINEMET IR) 25-100 MG per tablet immediate release 3 tablet  3 tablet Oral [id number] [name], MD   3 tablet at 06/01/22 [phone]  cefTRIAXone (ROCEPHIN) 1 g in sodium chloride 0.9 % 100 mL IVPB  1 g Intravenous Q24H Bobette Mo, MD 200 mL/hr at 05/31/22 1232 1 g at 05/31/22 1232   entacapone (COMTAN) tablet 200 mg  200 mg Oral TID Danford Bad, RPH   200 mg at 06/01/22 0753   finasteride (PROSCAR) tablet 5 mg  5 mg Oral Daily Bobette Mo, MD   5 mg at 06/01/22 1025   fludrocortisone (FLORINEF) tablet 0.1 mg  0.1 mg Oral BID [name], MD   0.1 mg at 06/01/22 8527   guaiFENesin (MUCINEX) 12 hr tablet 600 mg  600 mg Oral BID Bobette Mo, MD   600 mg at 06/01/22 0917    midodrine (PROAMATINE) tablet 5 mg  5 mg Oral TID WC Bobette Mo, MD   5 mg at 06/01/22 0753   ondansetron (ZOFRAN) tablet 4 mg  4 mg Oral Q6H PRN Bobette Mo, MD       Or   ondansetron [facility]) injection 4 mg  4 mg Intravenous Q6H PRN Bobette Mo, MD       potassium chloride SA (KLOR-CON M) CR tablet 40 mEq  40 mEq Oral Q2H [name], MD   40 mEq at 06/01/22 0917   sodium chloride flush (NS) 0.9 % injection 3 mL  3 mL Intravenous Q12H Bobette Mo, MD   3 mL at 06/01/22 7824     Discharge Medications: Please see discharge summary for a list of discharge medications.  Relevant Imaging Results:  Relevant Lab Results:   Additional Information SSN: [id number]  Otelia Santee, LCSW

## 2022-06-02 DIAGNOSIS — R490 Dysphonia: Secondary | ICD-10-CM | POA: Diagnosis not present

## 2022-06-02 DIAGNOSIS — R41841 Cognitive communication deficit: Secondary | ICD-10-CM | POA: Diagnosis not present

## 2022-06-02 DIAGNOSIS — R488 Other symbolic dysfunctions: Secondary | ICD-10-CM | POA: Diagnosis not present

## 2022-06-03 ENCOUNTER — Other Ambulatory Visit: Payer: Self-pay

## 2022-06-03 ENCOUNTER — Emergency Department (HOSPITAL_COMMUNITY)
Admission: EM | Admit: 2022-06-03 | Discharge: 2022-06-03 | Disposition: A | Discharge status: Home / Self Care | Payer: Medicare PPO | Attending: Emergency Medicine | Admitting: Emergency Medicine

## 2022-06-03 ENCOUNTER — Emergency Department (HOSPITAL_COMMUNITY): Payer: Medicare PPO

## 2022-06-03 ENCOUNTER — Encounter (HOSPITAL_COMMUNITY): Payer: Self-pay | Admitting: Emergency Medicine

## 2022-06-03 DIAGNOSIS — R55 Syncope and collapse: Secondary | ICD-10-CM | POA: Diagnosis not present

## 2022-06-03 DIAGNOSIS — J45909 Unspecified asthma, uncomplicated: Secondary | ICD-10-CM | POA: Diagnosis not present

## 2022-06-03 DIAGNOSIS — Z79899 Other long term (current) drug therapy: Secondary | ICD-10-CM | POA: Diagnosis not present

## 2022-06-03 DIAGNOSIS — G20 Parkinson's disease: Secondary | ICD-10-CM | POA: Insufficient documentation

## 2022-06-03 DIAGNOSIS — E876 Hypokalemia: Secondary | ICD-10-CM | POA: Diagnosis not present

## 2022-06-03 LAB — CBC WITH DIFFERENTIAL/PLATELET
Abs Immature Granulocytes: 0.11 10*3/uL — ABNORMAL HIGH (ref 0.00–0.07)
Basophils Absolute: 0.1 10*3/uL (ref 0.0–0.1)
Basophils Relative: 1 %
Eosinophils Absolute: 0.2 10*3/uL (ref 0.0–0.5)
Eosinophils Relative: 3 %
HCT: 37.9 % — ABNORMAL LOW (ref 39.0–52.0)
Hemoglobin: 12 g/dL — ABNORMAL LOW (ref 13.0–17.0)
Immature Granulocytes: 2 %
Lymphocytes Relative: 16 %
Lymphs Abs: 1.2 10*3/uL (ref 0.7–4.0)
MCH: 31.7 pg (ref 26.0–34.0)
MCHC: 31.7 g/dL (ref 30.0–36.0)
MCV: 100.3 fL — ABNORMAL HIGH (ref 80.0–100.0)
Monocytes Absolute: 0.6 10*3/uL (ref 0.1–1.0)
Monocytes Relative: 8 %
Neutro Abs: 5.2 10*3/uL (ref 1.7–7.7)
Neutrophils Relative %: 70 %
Platelets: 276 10*3/uL (ref 150–400)
RBC: 3.78 MIL/uL — ABNORMAL LOW (ref 4.22–5.81)
RDW: 11.8 % (ref 11.5–15.5)
WBC: 7.4 10*3/uL (ref 4.0–10.5)
nRBC: 0 % (ref 0.0–0.2)

## 2022-06-03 LAB — COMPREHENSIVE METABOLIC PANEL
ALT: <5 U/L (ref 0–44)
AST: 14 U/L — ABNORMAL LOW (ref 15–41)
Albumin: 2.9 g/dL — ABNORMAL LOW (ref 3.5–5.0)
Alkaline Phosphatase: 59 U/L (ref 38–126)
Anion gap: 7 (ref 5–15)
BUN: 12 mg/dL (ref 8–23)
CO2: 26 mmol/L (ref 22–32)
Calcium: 8.2 mg/dL — ABNORMAL LOW (ref 8.9–10.3)
Chloride: 107 mmol/L (ref 98–111)
Creatinine, Ser: 0.69 mg/dL (ref 0.61–1.24)
GFR, Estimated: >60 mL/min (ref >60)
Glucose, Bld: 93 mg/dL (ref 70–99)
Potassium: 3.7 mmol/L (ref 3.5–5.1)
Sodium: 140 mmol/L (ref 135–145)
Total Bilirubin: 0.4 mg/dL (ref 0.3–1.2)
Total Protein: 6.5 g/dL (ref 6.5–8.1)

## 2022-06-03 LAB — MAGNESIUM: Magnesium: 2 mg/dL (ref 1.7–2.4)

## 2022-06-03 MED ORDER — CARBIDOPA-LEVODOPA 25-100 MG PO TABS
2.0000 | ORAL_TABLET | ORAL | Status: DC
  Administered 2022-06-03: 2 via ORAL
  Filled 2022-06-03 (×3): qty 2

## 2022-06-03 NOTE — Discharge Instructions (Addendum)
Continue to take your home medications as prescribed.  Follow-up with your primary care doctor for long-term management of your daily medications.  Return to the emergency department for any new or worsening symptoms of concern.

## 2022-06-03 NOTE — ED Triage Notes (Signed)
Pt arrives via EMS from [facility]. Pt had syncopal episode with wife while having a BM. EMS reports BP was 90/60 standing and 124/72 sitting. Pt had similar episode last week and wife reports hx of same. Also reports the facility has not updated his meds and has been giving him incorrect medication.

## 2022-06-03 NOTE — ED Provider Notes (Signed)
[facility]-EMERGENCY DEPT Provider Note   CSN: [id number] Arrival date & time: 06/03/22  0856     History  Chief Complaint  Patient presents with   Loss of Consciousness    Zachary [name] is a 71 y.o. male.  HPI Patient presents for syncope.  Medical history includes Parkinson's disease, orthostatic hypotension, neurocognitive disorder, prior episodes of syncope, asthma, and frequent falls.  He had a recent hospitalization for syncopal episode 5 days ago.  He was found to have profound hypokalemia.  Potassium was 4 at time of discharge 2 days ago.  His Florinef dose was decreased.  During his hospitalization, he was also treated for community-acquired pneumonia.  He was discharged on continued antibiotics.  Today, patient had a witnessed syncopal episode.  This did occur after standing.  His wife was present and was able to ease into a chair.  She describes a syncopal episode as loss of consciousness with eyes rolled back.  LOC was less than 1 minute.  Patient returned to mental baseline thereafter.  EMS was called.  EMS noted a blood pressure of 90/60 when standing.  When recumbent, blood pressure was 120s over 70s.  He received 300 cc IVF prior to arrival.  EKG with EMS showed sinus rhythm.  Patient currently denies any complaints.  Although his Florinef dose was decreased during his prior hospitalization, patient's wife feels that he has been getting his standard 3 tablet dose at his assisted living facility.  She does report that he has been getting his antibiotics.  He is also reportedly been receiving a potassium supplement.  Patient, himself, denies any physical complaints.    Home Medications Prior to Admission medications   Medication Sig Start Date End Date Taking? Authorizing Provider  amoxicillin-clavulanate (AUGMENTIN) 875-125 MG tablet Take 1 tablet by mouth 2 (two) times daily for 3 days. 06/01/22 06/04/22 Yes [name], [name], MD  carbidopa-levodopa (SINEMET  CR) 50-200 MG tablet TAKE ONE TABLET BY MOUTH AT BEDTIME Patient taking differently: Take 1 tablet by mouth at bedtime. 01/03/22  Yes [name], [name], DO  carbidopa-levodopa (SINEMET IR) 25-100 MG tablet 3 tabs AT 6:30AM/2 at 8:30AM/2 at 10:30AM/2 at 12:30PM/2 at 2:30PM/2 at 4:30pm/2 at 6:30pm. take up to extra tablet (in 1/2 tabs) per day AS NEEDED Patient taking differently: Take 2-3 tablets by mouth See admin instructions. Take 2 tablets by mouth every 2 hours from 0830- 1830  Take 3 tablet by mouth daily at 0630. 09/05/21  Yes [name], [name], DO  entacapone (COMTAN) 200 MG tablet Take 1 tablet (200 mg total) by mouth 3 (three) times daily (8:30/12:30/4:30). Patient taking differently: Take 200 mg by mouth 3 (three) times daily. 12/04/21  Yes [name], [name], DO  finasteride (PROSCAR) 5 MG tablet Take 5 mg by mouth daily. 12/29/21  Yes [provider]  fludrocortisone (FLORINEF) 0.1 MG tablet Take 1 tablet (0.1 mg total) by mouth 2 (two) times daily. 06/01/22  Yes [name], [name], MD  midodrine (PROAMATINE) 5 MG tablet TAKE ONE TABLET BY MOUTH THREE TIMES A DAILY WITH MEALS Patient taking differently: Take 5 mg by mouth 3 (three) times daily with meals. 01/18/22  Yes [name], [name], DO  mupirocin ointment (BACTROBAN) 2 % Apply 1 Application topically 2 (two) times daily.   Yes [provider]  potassium chloride SA (KLOR-CON M) 20 MEQ tablet Take 2 tablets (40 mEq total) by mouth 2 (two) times daily for 3 days. 06/01/22 06/04/22 Yes [name], Costin  Judie Petit, MD  AMBULATORY NON FORMULARY MEDICATION Transport chair Dx: Parkinsons Disease G20 12/24/19   [name], [name], DO  Cholecalciferol (DIALYVITE VITAMIN D 5000 PO) Take 1 tablet by mouth daily. Patient not taking: Reported on 05/29/2022    [provider]  Lactobacillus (PROBIOTIC ACIDOPHILUS PO) Take 1 tablet by mouth daily. Patient not taking: Reported on 05/29/2022    [provider]  Multiple Vitamins-Minerals (PRESERVISION AREDS  2 PO) Take 1 tablet by mouth daily. Patient not taking: Reported on 05/29/2022    [provider]      Allergies    Pear and Tree extract    Review of Systems   Review of Systems  Neurological:  Positive for syncope.  All other systems reviewed and are negative.   Physical Exam Updated Vital Signs BP 119/81   Pulse 77   Temp (!) 97.5 F (36.4 C) (Oral)   Resp (!) 24   Ht 5' 9.5" (1.765 m)   Wt 75 kg   SpO2 98%   BMI 24.07 kg/m  Physical Exam Vitals and nursing note reviewed.  Constitutional:      General: He is not in acute distress.    Appearance: Normal appearance. He is well-developed. He is not ill-appearing, toxic-appearing or diaphoretic.  HENT:     Head: Normocephalic and atraumatic.     Right Ear: External ear normal.     Left Ear: External ear normal.     Nose: Nose normal.     Mouth/Throat:     Mouth: Mucous membranes are moist.     Pharynx: Oropharynx is clear.  Eyes:     Extraocular Movements: Extraocular movements intact.     Conjunctiva/sclera: Conjunctivae normal.  Cardiovascular:     Rate and Rhythm: Normal rate and regular rhythm.     Heart sounds: No murmur heard. Pulmonary:     Effort: Pulmonary effort is normal. No respiratory distress.     Breath sounds: Normal breath sounds. No wheezing or rales.  Chest:     Chest wall: No tenderness.  Abdominal:     General: Abdomen is flat. There is no distension.     Palpations: Abdomen is soft.     Tenderness: There is no abdominal tenderness.  Musculoskeletal:        General: No swelling or deformity. Normal range of motion.     Cervical back: Normal range of motion and neck supple.     Right lower leg: No edema.     Left lower leg: No edema.  Skin:    General: Skin is warm and dry.     Coloration: Skin is not jaundiced or pale.  Neurological:     General: No focal deficit present.     Mental Status: He is alert and oriented to person, place, and time.     Cranial Nerves: No cranial  nerve deficit.     Sensory: No sensory deficit.     Motor: No weakness.     Coordination: Coordination normal.     Comments: Slowed movements and responses consistent with Parkinson's disease  Psychiatric:        Mood and Affect: Mood normal.        Behavior: Behavior normal.        Thought Content: Thought content normal.        Judgment: Judgment normal.     ED Results / Procedures / Treatments   Labs (all labs ordered are listed, but only abnormal results are displayed) Labs Reviewed  CBC WITH DIFFERENTIAL/PLATELET - Abnormal; Notable for the following components:      Result Value   RBC 3.78 (*)    Hemoglobin 12.0 (*)    HCT 37.9 (*)    MCV 100.3 (*)    Abs Immature Granulocytes 0.11 (*)    All other components within normal limits  COMPREHENSIVE METABOLIC PANEL - Abnormal; Notable for the following components:   Calcium 8.2 (*)    Albumin 2.9 (*)    AST 14 (*)    All other components within normal limits  MAGNESIUM    EKG None  Radiology DG Chest Portable 1 View  Result Date: 06/03/2022 CLINICAL DATA:  Pt arrived via EMS from [facility]. Pt had syncopal episode with wife while having a BM. EMS reports BP was 90/60 standing and 124/72 sitting. Pt had similar episode last week and wife reports hx of same. EXAM: PORTABLE CHEST 1 VIEW COMPARISON:  05/29/2022. FINDINGS: Cardiac silhouette mildly enlarged.  No mediastinal or hilar masses. Opacity in the right upper lobe is mildly decreased in extent compared to the most recent prior study. Remainder of the lungs is clear. No pleural effusion or pneumothorax. Skeletal structures are grossly intact. IMPRESSION: 1. Mild interval improvement in the right upper lobe consolidation since the most recent prior study, consistent with improved pneumonia. 2. No new abnormalities. Electronically Signed   By: Amie Portland M.D.   On: 06/03/2022 09:58    Procedures Procedures    Medications Ordered in ED Medications  - No data to display  ED Course/ Medical Decision Making/ A&P                           Medical Decision Making Amount and/or Complexity of Data Reviewed Labs: ordered. Radiology: ordered.   This patient presents to the ED for concern of syncope, this involves an extensive number of treatment options, and is a complaint that carries with it a high risk of complications and morbidity.  The differential diagnosis includes orthostatic hypotension, vasovagal episode, arrhythmia, dehydration, medication effect, TIA   Co morbidities that complicate the patient evaluation  Parkinson's disease, orthostatic hypotension, neurocognitive disorder, prior episodes of syncope, asthma, and frequent falls   Additional history obtained:  Additional history obtained from EMS, patient's wife External records from outside source obtained and reviewed including EMR   Lab Tests:  I Ordered, and personally interpreted labs.  The pertinent results include: Potassium has normalized.  Other electrolytes are normal as well.  Hemoglobin is baseline.  No leukocytosis is present.   Imaging Studies ordered:  I ordered imaging studies including chest x-ray I independently visualized and interpreted imaging which showed improved findings from prior pneumonia diagnosis I agree with the radiologist interpretation   Cardiac Monitoring: / EKG:  The patient was maintained on a cardiac monitor.  I personally viewed and interpreted the cardiac monitored which showed an underlying rhythm of: Sinus rhythm    Problem List / ED Course / Critical interventions / Medication management  71 year old male presenting for syncopal episode.  He has a history of the same.  EMS noted orthostatic hypotension on scene.  On arrival, patient is well-appearing and currently has no physical complaints.  He is normotensive when laying in recumbent position.  Heart rate is 54.  Per chart review, heart rate was in the 60s during his  recent admission.  Prior to arrival, patient received 300 cc of IV fluid.  We will complete  the 500 cc bolus.  Patient was kept on bedside cardiac monitor.  EKG showed sinus rhythm with biatrial enlargement.  Laboratory work-up was initiated to assess for any recurrence of electrolyte disturbances.  Home Sinemet was ordered.  On chart review, patient had a thorough work-up during his hospitalization last week for prior syncopal episode.  This included CT head 5 days ago.  He underwent echocardiogram 4 days ago.  Echocardiogram showed normal LVEF, grade 2 diastolic dysfunction, biatrial dilation, mild mitral valve regurgitation, no aortic stenosis.  On lab work, patient has resolution of his prior hypokalemia.  On chest x-ray, he has improvement of his prior pneumonia.  On reassessment, patient continues to rest comfortably and denies any current symptoms.  Blood pressure further increased to the range of 150s over 80s.  During his ED observation, he had no evidence of arrhythmia on cardiac monitor.  When stood up, although patient's blood pressure does drop, it remains in the normal range at 120s over 70s.  I suspect that patient's syncopal episode was secondary to orthostatic hypotension, which his wife confirms that he has had a long-term history of.  Given his recent comprehensive workup for syncope, I do not feel that any further testing is indicated at this time.  Patient and wife are in agreement and are comfortable with discharge home.  Patient was discharged in stable condition.   Social Determinants of Health:  Resides in an assisted living facility        Final Clinical Impression(s) / ED Diagnoses Final diagnoses:  Syncope, unspecified syncope type    Rx / DC Orders ED Discharge Orders     None         Gloris Manchester, MD 06/04/22 [phone]

## 2022-06-04 ENCOUNTER — Telehealth: Payer: Self-pay | Admitting: Neurology

## 2022-06-04 DIAGNOSIS — R296 Repeated falls: Secondary | ICD-10-CM | POA: Diagnosis not present

## 2022-06-04 DIAGNOSIS — R2689 Other abnormalities of gait and mobility: Secondary | ICD-10-CM | POA: Diagnosis not present

## 2022-06-04 DIAGNOSIS — M6281 Muscle weakness (generalized): Secondary | ICD-10-CM | POA: Diagnosis not present

## 2022-06-04 DIAGNOSIS — N3946 Mixed incontinence: Secondary | ICD-10-CM | POA: Diagnosis not present

## 2022-06-04 DIAGNOSIS — G20 Parkinson's disease: Secondary | ICD-10-CM | POA: Diagnosis not present

## 2022-06-04 NOTE — Telephone Encounter (Signed)
Patients wife called and stated Zachary [name] was in the hospital last week for 4 days and she called EMS yesterday.  He has been passing out.  She wanted to discuss a few things.

## 2022-06-04 NOTE — Telephone Encounter (Signed)
Patient wife advised of DR.tat note, It looks like he had pneumonia and OH and then potassium went low b/c of florinef so they decreased it and he passed out again.  He needs to make a f/u with Dr. Ladona Ridgel.  He was originally taking care of his orthostatic hypotension and I don't see a f/u has been made since all of this.  Wife will give Dr.Taylor office a call to schedule a follow up.

## 2022-06-04 NOTE — Telephone Encounter (Signed)
Per patient wife,  patient eyes roll back, head fell back,breathing heavy and gasping.  Per  wife I know we discussed with Dr.[name] before of him freezing up but this was different.  She is unsure what to do. He was seen at the Hospital yesterday but was release since they couldn't find anything wrong.  Please advise.

## 2022-06-05 ENCOUNTER — Emergency Department (HOSPITAL_COMMUNITY): Payer: Medicare PPO

## 2022-06-05 ENCOUNTER — Encounter (HOSPITAL_COMMUNITY): Payer: Self-pay

## 2022-06-05 ENCOUNTER — Emergency Department (HOSPITAL_COMMUNITY)
Admission: EM | Admit: 2022-06-05 | Discharge: 2022-06-05 | Disposition: A | Discharge status: Home / Self Care | Payer: Medicare PPO | Attending: Emergency Medicine | Admitting: Emergency Medicine

## 2022-06-05 ENCOUNTER — Other Ambulatory Visit: Payer: Self-pay

## 2022-06-05 DIAGNOSIS — E876 Hypokalemia: Secondary | ICD-10-CM | POA: Diagnosis not present

## 2022-06-05 DIAGNOSIS — I951 Orthostatic hypotension: Secondary | ICD-10-CM | POA: Insufficient documentation

## 2022-06-05 DIAGNOSIS — R55 Syncope and collapse: Secondary | ICD-10-CM | POA: Diagnosis not present

## 2022-06-05 DIAGNOSIS — G20 Parkinson's disease: Secondary | ICD-10-CM | POA: Diagnosis not present

## 2022-06-05 DIAGNOSIS — J189 Pneumonia, unspecified organism: Secondary | ICD-10-CM | POA: Diagnosis not present

## 2022-06-05 DIAGNOSIS — I959 Hypotension, unspecified: Secondary | ICD-10-CM | POA: Diagnosis not present

## 2022-06-05 LAB — BASIC METABOLIC PANEL
Anion gap: 7 (ref 5–15)
BUN: 11 mg/dL (ref 8–23)
CO2: 29 mmol/L (ref 22–32)
Calcium: 8.6 mg/dL — ABNORMAL LOW (ref 8.9–10.3)
Chloride: 105 mmol/L (ref 98–111)
Creatinine, Ser: 0.83 mg/dL (ref 0.61–1.24)
GFR, Estimated: >60 mL/min (ref >60)
Glucose, Bld: 96 mg/dL (ref 70–99)
Potassium: 3.9 mmol/L (ref 3.5–5.1)
Sodium: 141 mmol/L (ref 135–145)

## 2022-06-05 LAB — URINALYSIS, ROUTINE W REFLEX MICROSCOPIC
Bilirubin Urine: NEGATIVE
Glucose, UA: NEGATIVE mg/dL
Hgb urine dipstick: NEGATIVE
Ketones, ur: NEGATIVE mg/dL
Leukocytes,Ua: NEGATIVE
Nitrite: NEGATIVE
Protein, ur: NEGATIVE mg/dL
Specific Gravity, Urine: 1.008 (ref 1.005–1.030)
pH: 8 (ref 5.0–8.0)

## 2022-06-05 LAB — CBC
HCT: 40.1 % (ref 39.0–52.0)
Hemoglobin: 13.2 g/dL (ref 13.0–17.0)
MCH: 32 pg (ref 26.0–34.0)
MCHC: 32.9 g/dL (ref 30.0–36.0)
MCV: 97.1 fL (ref 80.0–100.0)
Platelets: 302 10*3/uL (ref 150–400)
RBC: 4.13 MIL/uL — ABNORMAL LOW (ref 4.22–5.81)
RDW: 11.9 % (ref 11.5–15.5)
WBC: 6.8 10*3/uL (ref 4.0–10.5)
nRBC: 0 % (ref 0.0–0.2)

## 2022-06-05 MED ORDER — CARBIDOPA-LEVODOPA 25-100 MG PO TABS
2.0000 | ORAL_TABLET | ORAL | Status: DC
Start: 2022-06-05 — End: 2022-06-05
  Filled 2022-06-05: qty 2

## 2022-06-05 MED ORDER — CARBIDOPA-LEVODOPA 25-100 MG PO TABS
1.0000 | ORAL_TABLET | ORAL | Status: DC
Start: 2022-06-05 — End: 2022-06-05
  Filled 2022-06-05: qty 1

## 2022-06-05 MED ORDER — MIDODRINE HCL 5 MG PO TABS
5.0000 mg | ORAL_TABLET | ORAL | Status: AC
  Administered 2022-06-05: 5 mg via ORAL
  Filled 2022-06-05: qty 1

## 2022-06-05 NOTE — Discharge Instructions (Signed)
Please continue all medications as currently prescribed Patient continues to have infiltrate on chest x-Toan Mort consistent with prior pneumonia.  This may need further imaging if it does not resolve in the next 1 to 2 weeks Please keep appointment with Dr. Keitha Butte in 2 days.

## 2022-06-05 NOTE — ED Notes (Signed)
Pt not able to stand for 3 minutes during orthostatic vitals. Pt slowly started to lean back and stated "I am dizzy." Nt's sat pt back down on bed and pt slowly started sliding off bed. Pt now resting comfortable back in bed. Pt placed on a male purewick. Pt and wife at bedside aware we need a urine sample.

## 2022-06-05 NOTE — ED Provider Notes (Signed)
[facility]-EMERGENCY DEPT Provider Note   CSN: [id number] Arrival date & time: 06/05/22  0814     History  Chief Complaint  Patient presents with  . Loss of Consciousness    Zachary [name] is a 71 y.o. male.  HPI Primary historian his wife Other history is obtained from chart 71 year old male history of Parkinson's, orthostatic hypotension, on Florinef, recent admission for syncope with diagnosis of pneumonia, presents today with reports of repeat episodes of syncope.  Wife describes episode yesterday morning while she was with him.  She states that he was on the commode and she was assisting him back into the wheelchair.  He began gazing into space.  His head went back and his eyes were rolling up, bilateral upper arms were "flailing".  This lasted for approximately a minute.  He then had an episode with his eyes open and unresponsive.  He became verbal again but was confused entire episode lasted around 10 minutes.  Wife just arrived this is happening fairly frequently with the episode she has been present for.  She does not know exactly what occurred this morning.  He is at [facility] screens.  She also describes this happening is primarily in the morning when he stands up.  Recent adjustments have been made to his Florinef from 0.3-0.1 as he has been orthostatic this was thought to be causing his symptoms.  Additionally has had multiple electrolyte abnormalities most prominently hypokalemia.  Patient was seen and evaluated in the hospital and discharged 8 4.  He returned 2 days ago on August 6 and was reevaluated at that time with electrolytes and had a normal potassium.  There is felt to be no significant change and the patient was discharged back to his facility.  He presents again for ongoing symptoms.  His wife states that he had pneumonia in the hospital and as far she knows he is still on antibiotics.  She has not noted any coughing or dyspnea.     Home  Medications Prior to Admission medications   Medication Sig Start Date End Date Taking? Authorizing Provider  AMBULATORY NON FORMULARY MEDICATION Transport chair Dx: Parkinsons Disease G20 12/24/19   [name], [name], DO  carbidopa-levodopa (SINEMET CR) 50-200 MG tablet TAKE ONE TABLET BY MOUTH AT BEDTIME Patient taking differently: Take 1 tablet by mouth at bedtime. 01/03/22   [name], [name], DO  carbidopa-levodopa (SINEMET IR) 25-100 MG tablet 3 tabs AT 6:30AM/2 at 8:30AM/2 at 10:30AM/2 at 12:30PM/2 at 2:30PM/2 at 4:30pm/2 at 6:30pm. take up to extra tablet (in 1/2 tabs) per day AS NEEDED Patient taking differently: Take 2-3 tablets by mouth See admin instructions. Take 2 tablets by mouth every 2 hours from 0830- 1830  Take 3 tablet by mouth daily at 0630. 09/05/21   [name], [name], DO  Cholecalciferol (DIALYVITE VITAMIN D 5000 PO) Take 1 tablet by mouth daily. Patient not taking: Reported on 05/29/2022    [provider]  entacapone (COMTAN) 200 MG tablet Take 1 tablet (200 mg total) by mouth 3 (three) times daily (8:30/12:30/4:30). Patient taking differently: Take 200 mg by mouth 3 (three) times daily. 12/04/21   [name], [name], DO  finasteride (PROSCAR) 5 MG tablet Take 5 mg by mouth daily. 12/29/21   [provider]  fludrocortisone (FLORINEF) 0.1 MG tablet Take 1 tablet (0.1 mg total) by mouth 2 (two) times daily. 06/01/22   [name], MD  Lactobacillus (PROBIOTIC ACIDOPHILUS PO) Take 1 tablet by mouth  daily. Patient not taking: Reported on 05/29/2022    [provider]  midodrine (PROAMATINE) 5 MG tablet TAKE ONE TABLET BY MOUTH THREE TIMES A DAILY WITH MEALS Patient taking differently: Take 5 mg by mouth 3 (three) times daily with meals. 01/18/22   [name], [name], DO  Multiple Vitamins-Minerals (PRESERVISION AREDS 2 PO) Take 1 tablet by mouth daily. Patient not taking: Reported on 05/29/2022    [provider]  mupirocin ointment (BACTROBAN) 2 % Apply 1  Application topically 2 (two) times daily.    [provider]  potassium chloride SA (KLOR-CON M) 20 MEQ tablet Take 2 tablets (40 mEq total) by mouth 2 (two) times daily for 3 days. 06/01/22 06/04/22  [name], MD      Allergies    Pear and Tree extract    Review of Systems   Review of Systems  Physical Exam Updated Vital Signs BP (!) 140/90   Pulse 69   Temp 98.7 F (37.1 C) (Oral)   Resp 14   SpO2 98%  Physical Exam Vitals and nursing note reviewed.  Constitutional:      Appearance: Normal appearance.  HENT:     Head: Normocephalic.     Right Ear: External ear normal.     Left Ear: External ear normal.     Nose: Nose normal.     Mouth/Throat:     Pharynx: Oropharynx is clear.  Eyes:     Pupils: Pupils are equal, round, and reactive to light.  Cardiovascular:     Rate and Rhythm: Normal rate.  Pulmonary:     Effort: Pulmonary effort is normal.  Abdominal:     General: Abdomen is flat.  Musculoskeletal:        General: Normal range of motion.     Cervical back: Normal range of motion.  Skin:    General: Skin is warm.     Capillary Refill: Capillary refill takes less than 2 seconds.  Neurological:     Mental Status: He is alert.  Psychiatric:        Mood and Affect: Mood normal.     ED Results / Procedures / Treatments   Labs (all labs ordered are listed, but only abnormal results are displayed) Labs Reviewed  CBC - Abnormal; Notable for the following components:      Result Value   RBC 4.13 (*)    All other components within normal limits  BASIC METABOLIC PANEL - Abnormal; Notable for the following components:   Calcium 8.6 (*)    All other components within normal limits  URINALYSIS, ROUTINE W REFLEX MICROSCOPIC - Abnormal; Notable for the following components:   APPearance HAZY (*)    All other components within normal limits    EKG EKG Interpretation  Date/Time:  Tuesday June 05 2022 07:23:10 EDT Ventricular Rate:  66 PR  Interval:  177 QRS Duration: 97 QT Interval:  431 QTC Calculation: 452 R Axis:   48 Text Interpretation: Sinus rhythm Biatrial enlargement RSR' in V1 or V2, right VCD or RVH No significant change since last tracing Confirmed by Margarita Grizzle [phone]) on 06/05/2022 8:44:45 AM  Radiology DG Chest Port 1 View  Result Date: 06/05/2022 CLINICAL DATA:  71 year old male with syncope. Hypotension. Recent right upper lobe consolidation. EXAM: PORTABLE CHEST 1 VIEW COMPARISON:  Chest radiographs 06/03/2022 and earlier. FINDINGS: Portable AP semi upright view at 0754 hours. Unresolved confluent right upper lobe and apical lung opacity since 05/29/2022. Stable lung volumes and  mild cardiomegaly. Other mediastinal contours are within normal limits. Visualized tracheal air column is within normal limits. No pneumothorax, pulmonary edema or pleural effusion. Suspicion of chronic right 1st and 2nd rib deformities. No rib erosion identified. No acute osseous abnormality identified. IMPRESSION: Unresolved confluent opacity in the right upper lobe since 05/29/2022. If there are not signs/symptoms of infection then consider Chest CT (IV contrast preferred) to evaluate pneumonia versus mass. Electronically Signed   By: Odessa Fleming M.D.   On: 06/05/2022 08:01    Procedures Procedures    Medications Ordered in ED Medications  carbidopa-levodopa (SINEMET IR) 25-100 MG per tablet immediate release 1 tablet (has no administration in time range)  carbidopa-levodopa (SINEMET IR) 25-100 MG per tablet immediate release 2 tablet (has no administration in time range)  midodrine (PROAMATINE) tablet 5 mg (has no administration in time range)    ED Course/ Medical Decision Making/ A&P Clinical Course as of 06/05/22 1116  Tue Jun 05, 2022  1114 [place] reviewed interpreted no evidence of hypokalemia CBC reviewed interpreted Urinalysis reviewed interpreted no evidence of infection [DR]  1114 Chest x-Mallory Schaad reviewed interpreted and  continues to have opacity in the right upper lobe consistent with known prior pneumonia [DR]    Clinical Course User Index [DR] Margarita Grizzle, MD                           Medical Decision Making 71 year old male with Parkinson's, orthostatic hypotension, on Florinef, who has had multiple evaluations for syncope.  Most recently admitted [id number] and reevaluated here on 8 6.  He has been noted to have profound hypokalemia.  His Florinef has been decreased from 1 3-0.1 During his last admission he had pneumonia and was treated with a 5-day course of Augmentin Echocardiogram showed EF of 60 to 65% with grade 2 diastolic dysfunction Today patient has had 2 repeat episodes.  One episode was yesterday and one episode today.  Wife witnessed the episode yesterday. Per wife's description there is episodes of staring and shaking. This raises concern for seizures-discussed with Dr. Otelia Limes.  Reviewed chart and patient has had profound hypotension consistent with this.  Appears that symptoms are more consistent with his hypotension and orthostasis Patient had some orthostasis today but not not significantly decrease in blood pressure. Labs checked and no significant metabolic abnormalities are noted today Care discussed with Dr. Cristal Deer.  She advises that patient is scheduled to see Dr. Keitha Butte in 2 days who will consider adjusting medication.  Given the patient's profound hypokalemia there is not appear to be room to increase his Florinef back.  He will continue on his midodrine as he has been currently.  She advises maximizing compression and slow changes. I have discussed all the above with his wife.  Patient is in assisted care and she is hiring people to come in during the day.  Advised that he should not be gotten out of bed until the caregivers are there and they will make sure that somebody is with him to avoid falling. She understands importance of following up with cardiology in 2 days as  scheduled.    Amount and/or Complexity of Data Reviewed Independent Historian: spouse External Data Reviewed: labs. Labs: ordered. Decision-making details documented in ED Course. Radiology: ordered and independent interpretation performed. Decision-making details documented in ED Course.  Risk Prescription drug management.           Final Clinical Impression(s) / ED Diagnoses Final diagnoses:  Near syncope  Orthostatic hypotension    Rx / DC Orders ED Discharge Orders     None         Margarita Grizzle, MD 06/05/22 1116

## 2022-06-05 NOTE — ED Notes (Addendum)
Upon transferring pt to wheelchair pt was unable to follow commands. This Clinical research associate successfully got pt into wheelchair. Upon attempting to place pt in POV pt had a glazed expression, pt's spouse stated, "He is about to have an episode." Pt became diaphoretic w/ total LOC. Security assisted keeping pt stable on this writers leg until Dolliver, NT arrived to help get pt safely into car as he was unconscious. Dr. Rosalia Hammers came outside, BP taken and WNL.  Pt's spouse verbalized feeling comfortable taking pt home.

## 2022-06-05 NOTE — ED Triage Notes (Signed)
Arrives via [facility] from [facility] for syncope. Per EMS report, pt was on the toilet trying to have a BM. EMS endorses pt was pale, cool, and diaphoretic with an initial pressure of 50/48 and only responding to painful stimulus.

## 2022-06-06 DIAGNOSIS — G20 Parkinson's disease: Secondary | ICD-10-CM | POA: Diagnosis not present

## 2022-06-06 DIAGNOSIS — R296 Repeated falls: Secondary | ICD-10-CM | POA: Diagnosis not present

## 2022-06-06 DIAGNOSIS — M6281 Muscle weakness (generalized): Secondary | ICD-10-CM | POA: Diagnosis not present

## 2022-06-06 DIAGNOSIS — R41841 Cognitive communication deficit: Secondary | ICD-10-CM | POA: Diagnosis not present

## 2022-06-06 DIAGNOSIS — N3946 Mixed incontinence: Secondary | ICD-10-CM | POA: Diagnosis not present

## 2022-06-06 DIAGNOSIS — R490 Dysphonia: Secondary | ICD-10-CM | POA: Diagnosis not present

## 2022-06-06 DIAGNOSIS — R488 Other symbolic dysfunctions: Secondary | ICD-10-CM | POA: Diagnosis not present

## 2022-06-06 NOTE — Progress Notes (Unsigned)
Cardiology Office Note Date:  06/06/2022  Patient ID:  Jacaden, Forbush [birth year], MRN [id number] PCP:  [name], MD  Electrophysiologist: Dr. Ladona Ridgel  ***refresh   Chief Complaint: *** post ER  History of Present Illness: ZAYON TRULSON is a 71 y.o. male with history of Parkinson's with falls and syncope, orthostatic hypotension.  He comes today to be seen for Dr. Ladona Ridgel, last seen by him 01/01/22, discusses a long hx of Parkinson's and in the last 3-4 years falls, some but not most associated with syncope. He had been previously started on midodrine, and florinef with to that visit no frank syncope, though had some falls His florinef was increased to 0.2 BID  Discussed Northera if moe syncope on his regime  Admitted 05/29/22-06/01/22 with syncope. Event described by the therapist at the facility that  the patient stare staring off and would not respond for 1 to 2 minutes.  He subsequently responded and stated that he was just very tired. He reported not feeling well, heavy legs for weeks, and more recently coughing, nighttime chills and sweats unknown of febrile. He was admitted with CAP, marked hypokalemia felt 2/2 recent adjustments in home florinef and required extensive supplementation. Florinef dose was reduced and discussed if hypokalemia continued to be problematic would stop the florinef and titrate midodrine Pneumonia treated Had an echo with preserved LVEF, no WMA  Back to the ER 06/03/22 with syncope, occurred upon standing wife was able to help to seated, EMS was called found his BP 90/60 standing and 120's supine got IFV in route Wife thought he may still be getting the florinef TID not BID K+ was OK, CXR was improved BPs 120's-150's in the ER (presumed supine) Discharged fro the ER  Back to the ER 06/05/22 with syncope. Wife was uncertain the specifics of this event Wife described the prior morning while she was with him   that he was on the commode and she was  assisting him back into the wheelchair.  He began gazing into space.  His head went back and his eyes were rolling up, bilateral upper arms were "flailing".  This lasted for approximately a minute.  He then had an episode with his eyes open and unresponsive.  He became verbal again but was confused entire episode lasted around 10 minutes Neurology was called though felt symptoms most c/w his known orthostatic hypotension, discussed with cardiology as well, and advised compression and slow position changes. Would not revisit florinef with his hypokalemia He was discharged from the ER with instructions to not stand without facility/skilled care available  06/05/22 K+ 3.9 BUN/Creat 11/0.83 WBC 6.8 H/H 13/40  Albumin on 8/6 was 2.9  *** compression?  *** hydration, sodium... *** northera? 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   Nocturia 04/15/2018   Orthostatic hypotension 03/02/2021   Parkinson's disease 06/1999   congenital R fourth nerve palsy    Past Surgical History:  Procedure Laterality Date   CATARACT EXTRACTION, BILATERAL     COLONOSCOPY     SHOULDER SURGERY Left    lymph nodes   TONSILLECTOMY     VASECTOMY      Current Outpatient Medications  Medication  Sig Dispense Refill   AMBULATORY NON FORMULARY MEDICATION Transport chair Dx: Parkinsons Disease G20 1 Device 0   carbidopa-levodopa (SINEMET CR) 50-200 MG tablet TAKE ONE TABLET BY MOUTH AT BEDTIME (Patient taking differently: Take 1 tablet by mouth at bedtime.) 90 tablet 0   carbidopa-levodopa (SINEMET IR) 25-100 MG tablet 3 tabs AT 6:30AM/2 at 8:30AM/2 at 10:30AM/2 at 12:30PM/2 at 2:30PM/2 at 4:30pm/2 at 6:30pm. take up to extra tablet (in 1/2 tabs) per day AS NEEDED  (Patient taking differently: Take 2-3 tablets by mouth See admin instructions. Take 2 tablets by mouth every 2 hours from 0830- 1830  Take 3 tablet by mouth daily at 0630.) 1375 tablet 1   Cholecalciferol (DIALYVITE VITAMIN D 5000 PO) Take 1 tablet by mouth daily. (Patient not taking: Reported on 05/29/2022)     entacapone (COMTAN) 200 MG tablet Take 1 tablet (200 mg total) by mouth 3 (three) times daily (8:30/12:30/4:30). (Patient taking differently: Take 200 mg by mouth 3 (three) times daily.) 270 tablet 0   finasteride (PROSCAR) 5 MG tablet Take 5 mg by mouth daily.     fludrocortisone (FLORINEF) 0.1 MG tablet Take 1 tablet (0.1 mg total) by mouth 2 (two) times daily. 360 tablet 3   Lactobacillus (PROBIOTIC ACIDOPHILUS PO) Take 1 tablet by mouth daily. (Patient not taking: Reported on 05/29/2022)     midodrine (PROAMATINE) 5 MG tablet TAKE ONE TABLET BY MOUTH THREE TIMES A DAILY WITH MEALS (Patient taking differently: Take 5 mg by mouth 3 (three) times daily with meals.) 270 tablet 0   Multiple Vitamins-Minerals (PRESERVISION AREDS 2 PO) Take 1 tablet by mouth daily. (Patient not taking: Reported on 05/29/2022)     mupirocin ointment (BACTROBAN) 2 % Apply 1 Application topically 2 (two) times daily.     potassium chloride SA (KLOR-CON M) 20 MEQ tablet Take 2 tablets (40 mEq total) by mouth 2 (two) times daily for 3 days. 12 tablet 0   No current facility-administered medications for this visit.    Allergies:   Pear and Tree extract   Social History:  The patient  reports that he has never smoked. He has never used smokeless tobacco. He reports that he does not currently use alcohol. He reports that he does not use drugs.   Family History:  The patient's family history includes Alzheimer's disease in his mother; Diabetes in his child; Heart disease in his father; Stroke in his maternal grandmother; Tremor in his father.  ROS:  Please see the history of present illness.    All other systems are  reviewed and otherwise negative.   PHYSICAL EXAM:  VS:  There were no vitals taken for this visit. BMI: There is no height or weight on file to calculate BMI. Well nourished, well developed, in no acute distress HEENT: normocephalic, atraumatic Neck: no JVD, carotid bruits or masses Cardiac:  *** RRR; no significant murmurs, no rubs, or gallops Lungs:  *** CTA b/l, no wheezing, rhonchi or rales Abd: soft, nontender MS: no deformity or *** atrophy Ext: *** no edema Skin: warm and dry, no rash Neuro:  No gross deficits appreciated Psych: euthymic mood, full affect    EKG:  not done today   05/30/22: TTE  1. Left ventricular ejection fraction, by estimation, is 60 to 65%. The  left ventricle has normal function. The left ventricle has no regional  wall motion abnormalities. Left ventricular diastolic parameters are  consistent with Grade II diastolic  dysfunction (pseudonormalization).   2. Right ventricular  systolic function is normal. The right ventricular  size is normal. There is mildly elevated pulmonary artery systolic  pressure. The estimated right ventricular systolic pressure is 37.2 mmHg.   3. Left atrial size was severely dilated.   4. Right atrial size was moderately dilated.   5. The mitral valve is normal in structure. Mild mitral valve  regurgitation. No evidence of mitral stenosis.   6. The aortic valve is tricuspid. There is mild calcification of the  aortic valve. Aortic valve regurgitation is not visualized. No aortic  stenosis is present.   7. The inferior vena cava is dilated in size with >50% respiratory  variability, suggesting right atrial pressure of 8 mmHg.   Device interrogation done today and reviewed by myself:  ***  Recent Labs: 06/03/2022: ALT <5; Magnesium 2.0 06/05/2022: BUN 11; Creatinine, Ser 0.83; Hemoglobin 13.2; Platelets 302; Potassium 3.9; Sodium 141  No results found for requested labs within last 365 days.   Estimated Creatinine  Clearance: 80.6 mL/min (by C-G formula based on SCr of 0.83 mg/dL).   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  The patient did not have any concerns regarding medicines.  Norma Fredrickson, PA-C 06/06/2022 7:06 PM     [facility] [address] [place] [address] [phone] (office)  [phone] (fax)

## 2022-06-07 ENCOUNTER — Ambulatory Visit: Payer: Medicare PPO | Admitting: Physician Assistant

## 2022-06-07 VITALS — BP 80/54 | Ht 69.0 in | Wt 170.0 lb

## 2022-06-07 DIAGNOSIS — I951 Orthostatic hypotension: Secondary | ICD-10-CM | POA: Diagnosis not present

## 2022-06-07 DIAGNOSIS — R296 Repeated falls: Secondary | ICD-10-CM | POA: Diagnosis not present

## 2022-06-07 DIAGNOSIS — N3946 Mixed incontinence: Secondary | ICD-10-CM | POA: Diagnosis not present

## 2022-06-07 DIAGNOSIS — M6281 Muscle weakness (generalized): Secondary | ICD-10-CM | POA: Diagnosis not present

## 2022-06-07 DIAGNOSIS — G20 Parkinson's disease: Secondary | ICD-10-CM | POA: Diagnosis not present

## 2022-06-07 NOTE — Patient Instructions (Addendum)
  Medication Instructions:   PLEASE CONTINUE OLD FLUDROCORTISONE DOSE UNTIL NEW DOSE SUPPLY IS AVAILABLE   Lab Work:NONE ORDERED  TODAY    If you have labs (blood work) drawn today and your tests are completely normal, you will receive your results only by: MyChart Message (if you have MyChart) OR A paper copy in the mail If you have any lab test that is abnormal or we need to change your treatment, we will call you to review the results.   Testing/Procedures: NONE ORDERED  TODAY    Follow-Up: At [facility], you and your health needs are our priority.  As part of our continuing mission to provide you with exceptional heart care, we have created designated Provider Care Teams.  These Care Teams include your primary Cardiologist (physician) and Advanced Practice Providers (APPs -  Physician Assistants and Nurse Practitioners) who all work together to provide you with the care you need, when you need it.  We recommend signing up for the patient portal called "MyChart".  Sign up information is provided on this After Visit Summary.  MyChart is used to connect with patients for Virtual Visits (Telemedicine).  Patients are able to view lab/test results, encounter notes, upcoming appointments, etc.  Non-urgent messages can be sent to your provider as well.   To learn more about what you can do with MyChart, go to [email or web address].    Your next appointment:   3-4 month(s)  The format for your next appointment:   In Person  Provider:   You may see Dr. Ladona Ridgel  one of the following Advanced Practice Providers on your designated Care Team:   Francis Dowse, [place] Casimiro Needle "Mardelle Matte" Lanna Poche, [place]    Other Instructions   Important Information About Sugar

## 2022-06-08 DIAGNOSIS — N3946 Mixed incontinence: Secondary | ICD-10-CM | POA: Diagnosis not present

## 2022-06-08 DIAGNOSIS — R296 Repeated falls: Secondary | ICD-10-CM | POA: Diagnosis not present

## 2022-06-08 DIAGNOSIS — M6281 Muscle weakness (generalized): Secondary | ICD-10-CM | POA: Diagnosis not present

## 2022-06-08 DIAGNOSIS — G20 Parkinson's disease: Secondary | ICD-10-CM | POA: Diagnosis not present

## 2022-06-08 DIAGNOSIS — R488 Other symbolic dysfunctions: Secondary | ICD-10-CM | POA: Diagnosis not present

## 2022-06-08 DIAGNOSIS — R41841 Cognitive communication deficit: Secondary | ICD-10-CM | POA: Diagnosis not present

## 2022-06-08 DIAGNOSIS — R2689 Other abnormalities of gait and mobility: Secondary | ICD-10-CM | POA: Diagnosis not present

## 2022-06-08 DIAGNOSIS — R490 Dysphonia: Secondary | ICD-10-CM | POA: Diagnosis not present

## 2022-06-11 DIAGNOSIS — M6281 Muscle weakness (generalized): Secondary | ICD-10-CM | POA: Diagnosis not present

## 2022-06-11 DIAGNOSIS — R41841 Cognitive communication deficit: Secondary | ICD-10-CM | POA: Diagnosis not present

## 2022-06-11 DIAGNOSIS — N3946 Mixed incontinence: Secondary | ICD-10-CM | POA: Diagnosis not present

## 2022-06-11 DIAGNOSIS — R488 Other symbolic dysfunctions: Secondary | ICD-10-CM | POA: Diagnosis not present

## 2022-06-11 DIAGNOSIS — G20 Parkinson's disease: Secondary | ICD-10-CM | POA: Diagnosis not present

## 2022-06-11 DIAGNOSIS — R296 Repeated falls: Secondary | ICD-10-CM | POA: Diagnosis not present

## 2022-06-11 DIAGNOSIS — R490 Dysphonia: Secondary | ICD-10-CM | POA: Diagnosis not present

## 2022-06-12 DIAGNOSIS — G20 Parkinson's disease: Secondary | ICD-10-CM | POA: Diagnosis not present

## 2022-06-12 DIAGNOSIS — R41841 Cognitive communication deficit: Secondary | ICD-10-CM | POA: Diagnosis not present

## 2022-06-12 DIAGNOSIS — M6281 Muscle weakness (generalized): Secondary | ICD-10-CM | POA: Diagnosis not present

## 2022-06-12 DIAGNOSIS — R079 Chest pain, unspecified: Secondary | ICD-10-CM | POA: Diagnosis not present

## 2022-06-12 DIAGNOSIS — R490 Dysphonia: Secondary | ICD-10-CM | POA: Diagnosis not present

## 2022-06-12 DIAGNOSIS — R296 Repeated falls: Secondary | ICD-10-CM | POA: Diagnosis not present

## 2022-06-12 DIAGNOSIS — R918 Other nonspecific abnormal finding of lung field: Secondary | ICD-10-CM | POA: Diagnosis not present

## 2022-06-12 DIAGNOSIS — R488 Other symbolic dysfunctions: Secondary | ICD-10-CM | POA: Diagnosis not present

## 2022-06-12 DIAGNOSIS — N3946 Mixed incontinence: Secondary | ICD-10-CM | POA: Diagnosis not present

## 2022-06-13 DIAGNOSIS — G20 Parkinson's disease: Secondary | ICD-10-CM | POA: Diagnosis not present

## 2022-06-13 DIAGNOSIS — N3946 Mixed incontinence: Secondary | ICD-10-CM | POA: Diagnosis not present

## 2022-06-13 DIAGNOSIS — M6281 Muscle weakness (generalized): Secondary | ICD-10-CM | POA: Diagnosis not present

## 2022-06-13 DIAGNOSIS — R296 Repeated falls: Secondary | ICD-10-CM | POA: Diagnosis not present

## 2022-06-14 DIAGNOSIS — R296 Repeated falls: Secondary | ICD-10-CM | POA: Diagnosis not present

## 2022-06-14 DIAGNOSIS — M6281 Muscle weakness (generalized): Secondary | ICD-10-CM | POA: Diagnosis not present

## 2022-06-14 DIAGNOSIS — N3946 Mixed incontinence: Secondary | ICD-10-CM | POA: Diagnosis not present

## 2022-06-14 DIAGNOSIS — G20 Parkinson's disease: Secondary | ICD-10-CM | POA: Diagnosis not present

## 2022-06-15 ENCOUNTER — Telehealth: Payer: Self-pay | Admitting: Neurology

## 2022-06-15 ENCOUNTER — Other Ambulatory Visit: Payer: Self-pay | Admitting: Neurology

## 2022-06-15 DIAGNOSIS — R1312 Dysphagia, oropharyngeal phase: Secondary | ICD-10-CM | POA: Diagnosis not present

## 2022-06-15 DIAGNOSIS — R488 Other symbolic dysfunctions: Secondary | ICD-10-CM | POA: Diagnosis not present

## 2022-06-15 DIAGNOSIS — R490 Dysphonia: Secondary | ICD-10-CM | POA: Diagnosis not present

## 2022-06-15 DIAGNOSIS — R41841 Cognitive communication deficit: Secondary | ICD-10-CM | POA: Diagnosis not present

## 2022-06-15 DIAGNOSIS — G20 Parkinson's disease: Secondary | ICD-10-CM

## 2022-06-15 DIAGNOSIS — N3946 Mixed incontinence: Secondary | ICD-10-CM | POA: Diagnosis not present

## 2022-06-15 DIAGNOSIS — M6281 Muscle weakness (generalized): Secondary | ICD-10-CM | POA: Diagnosis not present

## 2022-06-15 DIAGNOSIS — R296 Repeated falls: Secondary | ICD-10-CM | POA: Diagnosis not present

## 2022-06-15 NOTE — Telephone Encounter (Signed)
Called [facility] and let her know I sent patients prescription this morning

## 2022-06-15 NOTE — Telephone Encounter (Signed)
Zachary [name] from [facility] called about refills on his midodrine. She left a voice mail requesting a call back.

## 2022-06-18 DIAGNOSIS — N3946 Mixed incontinence: Secondary | ICD-10-CM | POA: Diagnosis not present

## 2022-06-18 DIAGNOSIS — M6281 Muscle weakness (generalized): Secondary | ICD-10-CM | POA: Diagnosis not present

## 2022-06-18 DIAGNOSIS — Z0000 Encounter for general adult medical examination without abnormal findings: Secondary | ICD-10-CM | POA: Diagnosis not present

## 2022-06-18 DIAGNOSIS — G20 Parkinson's disease: Secondary | ICD-10-CM | POA: Diagnosis not present

## 2022-06-18 DIAGNOSIS — R7989 Other specified abnormal findings of blood chemistry: Secondary | ICD-10-CM | POA: Diagnosis not present

## 2022-06-18 DIAGNOSIS — R296 Repeated falls: Secondary | ICD-10-CM | POA: Diagnosis not present

## 2022-06-19 DIAGNOSIS — G20 Parkinson's disease: Secondary | ICD-10-CM | POA: Diagnosis not present

## 2022-06-19 DIAGNOSIS — L570 Actinic keratosis: Secondary | ICD-10-CM | POA: Diagnosis not present

## 2022-06-19 DIAGNOSIS — L814 Other melanin hyperpigmentation: Secondary | ICD-10-CM | POA: Diagnosis not present

## 2022-06-19 DIAGNOSIS — R2689 Other abnormalities of gait and mobility: Secondary | ICD-10-CM | POA: Diagnosis not present

## 2022-06-19 DIAGNOSIS — M6281 Muscle weakness (generalized): Secondary | ICD-10-CM | POA: Diagnosis not present

## 2022-06-19 DIAGNOSIS — Z85828 Personal history of other malignant neoplasm of skin: Secondary | ICD-10-CM | POA: Diagnosis not present

## 2022-06-19 DIAGNOSIS — N3946 Mixed incontinence: Secondary | ICD-10-CM | POA: Diagnosis not present

## 2022-06-19 DIAGNOSIS — D1801 Hemangioma of skin and subcutaneous tissue: Secondary | ICD-10-CM | POA: Diagnosis not present

## 2022-06-19 DIAGNOSIS — R296 Repeated falls: Secondary | ICD-10-CM | POA: Diagnosis not present

## 2022-06-19 DIAGNOSIS — C44612 Basal cell carcinoma of skin of right upper limb, including shoulder: Secondary | ICD-10-CM | POA: Diagnosis not present

## 2022-06-19 DIAGNOSIS — L821 Other seborrheic keratosis: Secondary | ICD-10-CM | POA: Diagnosis not present

## 2022-06-20 DIAGNOSIS — G20 Parkinson's disease: Secondary | ICD-10-CM | POA: Diagnosis not present

## 2022-06-20 DIAGNOSIS — R1312 Dysphagia, oropharyngeal phase: Secondary | ICD-10-CM | POA: Diagnosis not present

## 2022-06-20 DIAGNOSIS — R41841 Cognitive communication deficit: Secondary | ICD-10-CM | POA: Diagnosis not present

## 2022-06-20 DIAGNOSIS — R490 Dysphonia: Secondary | ICD-10-CM | POA: Diagnosis not present

## 2022-06-20 DIAGNOSIS — M6281 Muscle weakness (generalized): Secondary | ICD-10-CM | POA: Diagnosis not present

## 2022-06-20 DIAGNOSIS — R296 Repeated falls: Secondary | ICD-10-CM | POA: Diagnosis not present

## 2022-06-20 DIAGNOSIS — R488 Other symbolic dysfunctions: Secondary | ICD-10-CM | POA: Diagnosis not present

## 2022-06-20 DIAGNOSIS — N3946 Mixed incontinence: Secondary | ICD-10-CM | POA: Diagnosis not present

## 2022-06-21 DIAGNOSIS — R296 Repeated falls: Secondary | ICD-10-CM | POA: Diagnosis not present

## 2022-06-21 DIAGNOSIS — R488 Other symbolic dysfunctions: Secondary | ICD-10-CM | POA: Diagnosis not present

## 2022-06-21 DIAGNOSIS — R2689 Other abnormalities of gait and mobility: Secondary | ICD-10-CM | POA: Diagnosis not present

## 2022-06-21 DIAGNOSIS — M6281 Muscle weakness (generalized): Secondary | ICD-10-CM | POA: Diagnosis not present

## 2022-06-21 DIAGNOSIS — R41841 Cognitive communication deficit: Secondary | ICD-10-CM | POA: Diagnosis not present

## 2022-06-21 DIAGNOSIS — R1312 Dysphagia, oropharyngeal phase: Secondary | ICD-10-CM | POA: Diagnosis not present

## 2022-06-21 DIAGNOSIS — R490 Dysphonia: Secondary | ICD-10-CM | POA: Diagnosis not present

## 2022-06-21 DIAGNOSIS — G20 Parkinson's disease: Secondary | ICD-10-CM | POA: Diagnosis not present

## 2022-06-21 DIAGNOSIS — N3946 Mixed incontinence: Secondary | ICD-10-CM | POA: Diagnosis not present

## 2022-06-22 DIAGNOSIS — N3946 Mixed incontinence: Secondary | ICD-10-CM | POA: Diagnosis not present

## 2022-06-22 DIAGNOSIS — R296 Repeated falls: Secondary | ICD-10-CM | POA: Diagnosis not present

## 2022-06-22 DIAGNOSIS — G20 Parkinson's disease: Secondary | ICD-10-CM | POA: Diagnosis not present

## 2022-06-22 DIAGNOSIS — M6281 Muscle weakness (generalized): Secondary | ICD-10-CM | POA: Diagnosis not present

## 2022-06-25 DIAGNOSIS — R296 Repeated falls: Secondary | ICD-10-CM | POA: Diagnosis not present

## 2022-06-25 DIAGNOSIS — R488 Other symbolic dysfunctions: Secondary | ICD-10-CM | POA: Diagnosis not present

## 2022-06-25 DIAGNOSIS — M6281 Muscle weakness (generalized): Secondary | ICD-10-CM | POA: Diagnosis not present

## 2022-06-25 DIAGNOSIS — J189 Pneumonia, unspecified organism: Secondary | ICD-10-CM | POA: Diagnosis not present

## 2022-06-25 DIAGNOSIS — N3946 Mixed incontinence: Secondary | ICD-10-CM | POA: Diagnosis not present

## 2022-06-25 DIAGNOSIS — G3184 Mild cognitive impairment, so stated: Secondary | ICD-10-CM | POA: Diagnosis not present

## 2022-06-25 DIAGNOSIS — Z1331 Encounter for screening for depression: Secondary | ICD-10-CM | POA: Diagnosis not present

## 2022-06-25 DIAGNOSIS — Z1339 Encounter for screening examination for other mental health and behavioral disorders: Secondary | ICD-10-CM | POA: Diagnosis not present

## 2022-06-25 DIAGNOSIS — I951 Orthostatic hypotension: Secondary | ICD-10-CM | POA: Diagnosis not present

## 2022-06-25 DIAGNOSIS — G20 Parkinson's disease: Secondary | ICD-10-CM | POA: Diagnosis not present

## 2022-06-25 DIAGNOSIS — R2689 Other abnormalities of gait and mobility: Secondary | ICD-10-CM | POA: Diagnosis not present

## 2022-06-25 DIAGNOSIS — R490 Dysphonia: Secondary | ICD-10-CM | POA: Diagnosis not present

## 2022-06-25 DIAGNOSIS — R55 Syncope and collapse: Secondary | ICD-10-CM | POA: Diagnosis not present

## 2022-06-25 DIAGNOSIS — R1312 Dysphagia, oropharyngeal phase: Secondary | ICD-10-CM | POA: Diagnosis not present

## 2022-06-25 DIAGNOSIS — K5909 Other constipation: Secondary | ICD-10-CM | POA: Diagnosis not present

## 2022-06-25 DIAGNOSIS — R41841 Cognitive communication deficit: Secondary | ICD-10-CM | POA: Diagnosis not present

## 2022-06-25 DIAGNOSIS — Z0000 Encounter for general adult medical examination without abnormal findings: Secondary | ICD-10-CM | POA: Diagnosis not present

## 2022-06-26 DIAGNOSIS — G20 Parkinson's disease: Secondary | ICD-10-CM | POA: Diagnosis not present

## 2022-06-26 DIAGNOSIS — R488 Other symbolic dysfunctions: Secondary | ICD-10-CM | POA: Diagnosis not present

## 2022-06-26 DIAGNOSIS — R296 Repeated falls: Secondary | ICD-10-CM | POA: Diagnosis not present

## 2022-06-26 DIAGNOSIS — N3946 Mixed incontinence: Secondary | ICD-10-CM | POA: Diagnosis not present

## 2022-06-26 DIAGNOSIS — R490 Dysphonia: Secondary | ICD-10-CM | POA: Diagnosis not present

## 2022-06-26 DIAGNOSIS — M6281 Muscle weakness (generalized): Secondary | ICD-10-CM | POA: Diagnosis not present

## 2022-06-26 DIAGNOSIS — R1312 Dysphagia, oropharyngeal phase: Secondary | ICD-10-CM | POA: Diagnosis not present

## 2022-06-26 DIAGNOSIS — R41841 Cognitive communication deficit: Secondary | ICD-10-CM | POA: Diagnosis not present

## 2022-06-27 DIAGNOSIS — R296 Repeated falls: Secondary | ICD-10-CM | POA: Diagnosis not present

## 2022-06-27 DIAGNOSIS — M6281 Muscle weakness (generalized): Secondary | ICD-10-CM | POA: Diagnosis not present

## 2022-06-27 DIAGNOSIS — R1312 Dysphagia, oropharyngeal phase: Secondary | ICD-10-CM | POA: Diagnosis not present

## 2022-06-27 DIAGNOSIS — N3946 Mixed incontinence: Secondary | ICD-10-CM | POA: Diagnosis not present

## 2022-06-27 DIAGNOSIS — R2689 Other abnormalities of gait and mobility: Secondary | ICD-10-CM | POA: Diagnosis not present

## 2022-06-27 DIAGNOSIS — G20 Parkinson's disease: Secondary | ICD-10-CM | POA: Diagnosis not present

## 2022-06-27 DIAGNOSIS — R488 Other symbolic dysfunctions: Secondary | ICD-10-CM | POA: Diagnosis not present

## 2022-06-27 DIAGNOSIS — R41841 Cognitive communication deficit: Secondary | ICD-10-CM | POA: Diagnosis not present

## 2022-06-27 DIAGNOSIS — R490 Dysphonia: Secondary | ICD-10-CM | POA: Diagnosis not present

## 2022-06-28 DIAGNOSIS — R490 Dysphonia: Secondary | ICD-10-CM | POA: Diagnosis not present

## 2022-06-28 DIAGNOSIS — G20 Parkinson's disease: Secondary | ICD-10-CM | POA: Diagnosis not present

## 2022-06-28 DIAGNOSIS — R488 Other symbolic dysfunctions: Secondary | ICD-10-CM | POA: Diagnosis not present

## 2022-06-28 DIAGNOSIS — N3946 Mixed incontinence: Secondary | ICD-10-CM | POA: Diagnosis not present

## 2022-06-28 DIAGNOSIS — R1312 Dysphagia, oropharyngeal phase: Secondary | ICD-10-CM | POA: Diagnosis not present

## 2022-06-28 DIAGNOSIS — R41841 Cognitive communication deficit: Secondary | ICD-10-CM | POA: Diagnosis not present

## 2022-06-28 DIAGNOSIS — M6281 Muscle weakness (generalized): Secondary | ICD-10-CM | POA: Diagnosis not present

## 2022-06-28 DIAGNOSIS — R296 Repeated falls: Secondary | ICD-10-CM | POA: Diagnosis not present

## 2022-06-29 DIAGNOSIS — G20 Parkinson's disease: Secondary | ICD-10-CM | POA: Diagnosis not present

## 2022-06-29 DIAGNOSIS — R296 Repeated falls: Secondary | ICD-10-CM | POA: Diagnosis not present

## 2022-06-29 DIAGNOSIS — M6281 Muscle weakness (generalized): Secondary | ICD-10-CM | POA: Diagnosis not present

## 2022-06-29 DIAGNOSIS — N3946 Mixed incontinence: Secondary | ICD-10-CM | POA: Diagnosis not present

## 2022-07-02 DIAGNOSIS — M6281 Muscle weakness (generalized): Secondary | ICD-10-CM | POA: Diagnosis not present

## 2022-07-02 DIAGNOSIS — G20 Parkinson's disease: Secondary | ICD-10-CM | POA: Diagnosis not present

## 2022-07-02 DIAGNOSIS — R2689 Other abnormalities of gait and mobility: Secondary | ICD-10-CM | POA: Diagnosis not present

## 2022-07-03 DIAGNOSIS — R490 Dysphonia: Secondary | ICD-10-CM | POA: Diagnosis not present

## 2022-07-03 DIAGNOSIS — R1312 Dysphagia, oropharyngeal phase: Secondary | ICD-10-CM | POA: Diagnosis not present

## 2022-07-03 DIAGNOSIS — R488 Other symbolic dysfunctions: Secondary | ICD-10-CM | POA: Diagnosis not present

## 2022-07-03 DIAGNOSIS — G20 Parkinson's disease: Secondary | ICD-10-CM | POA: Diagnosis not present

## 2022-07-03 DIAGNOSIS — R2689 Other abnormalities of gait and mobility: Secondary | ICD-10-CM | POA: Diagnosis not present

## 2022-07-03 DIAGNOSIS — Z111 Encounter for screening for respiratory tuberculosis: Secondary | ICD-10-CM | POA: Diagnosis not present

## 2022-07-03 DIAGNOSIS — M6281 Muscle weakness (generalized): Secondary | ICD-10-CM | POA: Diagnosis not present

## 2022-07-03 DIAGNOSIS — R41841 Cognitive communication deficit: Secondary | ICD-10-CM | POA: Diagnosis not present

## 2022-07-04 DIAGNOSIS — M6281 Muscle weakness (generalized): Secondary | ICD-10-CM | POA: Diagnosis not present

## 2022-07-04 DIAGNOSIS — R2689 Other abnormalities of gait and mobility: Secondary | ICD-10-CM | POA: Diagnosis not present

## 2022-07-04 DIAGNOSIS — G20 Parkinson's disease: Secondary | ICD-10-CM | POA: Diagnosis not present

## 2022-07-04 DIAGNOSIS — R296 Repeated falls: Secondary | ICD-10-CM | POA: Diagnosis not present

## 2022-07-04 DIAGNOSIS — N3946 Mixed incontinence: Secondary | ICD-10-CM | POA: Diagnosis not present

## 2022-07-05 DIAGNOSIS — Z681 Body mass index (BMI) 19 or less, adult: Secondary | ICD-10-CM | POA: Diagnosis not present

## 2022-07-05 DIAGNOSIS — Z111 Encounter for screening for respiratory tuberculosis: Secondary | ICD-10-CM | POA: Diagnosis not present

## 2022-07-19 ENCOUNTER — Telehealth: Payer: Self-pay | Admitting: Internal Medicine

## 2022-07-19 NOTE — Telephone Encounter (Signed)
Pt c/o medication issue:  1. Name of Medication: fludrocortisone (FLORINEF) 0.1 MG tablet  2. How are you currently taking this medication (dosage and times per day)? Take 1 tablet (0.1 mg total) by mouth 2 (two) times daily.  3. Are you having a reaction (difficulty breathing--STAT)? no  4. What is your medication issue? Wife is calling in to see when the times are that the patient is suppose to take the medication. Please advise

## 2022-07-24 ENCOUNTER — Encounter: Payer: Self-pay | Admitting: Neurology

## 2022-07-24 ENCOUNTER — Ambulatory Visit: Payer: Medicare PPO | Admitting: Neurology

## 2022-07-24 NOTE — Progress Notes (Deleted)
Assessment/Plan:   1.  Parkinsons Disease  -take Carbidopa/levodopa 25/100, 3 tablets at 6:30 AM/2 at 8:30 AM/2 at 10:30 AM/2 at 12:30 PM/2 at 2:30 PM/2 at 4:30pm/2 at 6:30pm.  Can take up to extra tablet (in 1/2 tabs) per day prn  -okay to take entacapone 200 mg with 8:30am/12:30pm/4:30pm dose but we will need to watch cognition with time  -Continue carbidopa/levodopa 50/200 at bed.  -Patient had CT brain done that suggested ventricles slightly out of proportion to atrophy.  They have opted to not do lumbar puncture.  2.  RBD             -Safety discussed             -Was on 0.25 mg of clonazepam in the past.  They discontinued as pharmacist told them it was addictive.     3.    Memory change/PDD             -Patient had neurocognitive testing with Dr. Melvyn Novas on May 24 and demonstrated PDD.  Patient actually did fairly well on the testing, but because of some troubles with ADLs and high level of education, they actually classified it with mild PDD  -They are discussing changing living situation, with having patient going to [name] while his wife lives independently.   4.  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 Lovena Le.  Has had multiple episodes of syncope and 1 episode of probable convulsive syncope.  He had hypokalemia with high doses of Florinef.  The Florinef has had to be decreased to 0.1 mg twice per day.  There have been issues with [name] giving him this medication as well, as apparently they did not have the right dosage.  -I think his midodrine has plenty of room to go up.  Subjective:   Zachary [name] was seen today in follow up for Parkinsons disease.  My previous records were reviewed prior to todays visit as well as outside records available to me.  Patient seen with wife who supplements history.  We have had several phone calls from the patient/family since our last visit.  Medical records have been reviewed  regarding falls and hospitalizations for syncope.  He was hospitalized from August 1 until August 4 after a syncopal episode.  He apparently was working with physical therapy and then started to stare off and did not respond for a few minutes.  On arrival to the emergency room, his blood pressure was only in the 90s, but after fluids, his blood pressure rose up to 137/82.  Patient was sick at the time and had been taking Mucinex as an outpatient.  His potassium was only 2.1 in the emergency room.  He was admitted, treated for community-acquired pneumonia and his hypokalemia (felt to due to the fact that his home Florinef had been increased to 0.3 mg twice per day as an outpatient).  Because of this, his Florinef was decreased back to 0.1 twice per day.  He was back in the emergency room 2 days later after another syncopal episode.  EMS noted blood pressure of 90/60.  Patient was treated with fluids in the emergency room and discharged.  He was back in the emergency room again 2 days later with syncope again.  This time, following the syncopal episode, his head went back and eyes were rolling and he had shaking of the upper arms for 1 minute.  No changes were made at that emergency room visit to his medication regimen.  He saw the cardiology PA 2 days later.  It appears [facility] had not been giving him his Florinef at all.  Recommendations were a little unclear.  Note stated "I have advised that until they get his increased Florinef dose available that they continue the prior dosing rather than none at all."  Separately, we have talked to patient/wife/family several times since last visit about ventriculomegaly and what it means and what it does not mean.  There has been much confusion over this finding on his neuro imaging, in part because extended family has not been here, but wife has been very involved in the care for many years.  They have opted to not do lumbar puncture.    Current prescribed  movement disorder medications: Carbidopa/levodopa 25/100, 3 tablets at 6:30 AM/2 at 8:30 AM/2 at 10:30 AM/2 at 12:30 PM/2 at 2:30 PM/2 at 4:30pm/2 at 6:30pm.   Carbidopa/levodopa 50/200 at bedtime Entacapone, 200 mg, 1 with 8:30 AM/12:30 PM/4:30 PM dose  Midodrine, 5 mg 3 times per day (added last visit)  PREVIOUS MEDICATIONS: amantadine; Azilect; Inbrija; carbidopa/levodopa 25/100; carbidopa/levodopa 50/200; Stalevo; clonazepam (was only on 0.25 mg at bedtime and they discontinued as pharmacist told them it was addictive)  ALLERGIES:   Allergies  Allergen Reactions   Pear Other (See Comments)    Unknown Not listed on MAR   Tree Extract Other (See Comments)    Unknown Not listed on MAR    CURRENT MEDICATIONS:  Outpatient Encounter Medications as of 07/24/2022  Medication Sig   AMBULATORY NON FORMULARY MEDICATION Transport chair Dx: Parkinsons Disease G20   carbidopa-levodopa (SINEMET CR) 50-200 MG tablet TAKE ONE TABLET BY MOUTH AT BEDTIME   carbidopa-levodopa (SINEMET IR) 25-100 MG tablet 3 tabs AT 6:30AM/2 at 8:30AM/2 at 10:30AM/2 at 12:30PM/2 at 2:30PM/2 at 4:30pm/2 at 6:30pm. take up to extra tablet (in 1/2 tabs) per day AS NEEDED   Cholecalciferol (DIALYVITE VITAMIN D 5000 PO) Take 1 tablet by mouth daily.   entacapone (COMTAN) 200 MG tablet Take 1 tablet (200 mg total) by mouth 3 (three) times daily (8:30/12:30/4:30).   finasteride (PROSCAR) 5 MG tablet Take 5 mg by mouth daily.   fludrocortisone (FLORINEF) 0.1 MG tablet Take 1 tablet (0.1 mg total) by mouth 2 (two) times daily.   Lactobacillus (PROBIOTIC ACIDOPHILUS PO) Take 1 tablet by mouth daily.   midodrine (PROAMATINE) 5 MG tablet TAKE 1 TABLET BY MOUTH THREE TIMES DAILY WITH MEALS   Multiple Vitamins-Minerals (PRESERVISION AREDS 2 PO) Take 1 tablet by mouth daily.   mupirocin ointment (BACTROBAN) 2 % Apply 1 Application topically 2 (two) times daily.   potassium chloride SA (KLOR-CON M) 20 MEQ tablet Take 2 tablets (40  mEq total) by mouth 2 (two) times daily for 3 days.   No facility-administered encounter medications on file as of 07/24/2022.    Objective:   PHYSICAL EXAMINATION:    VITALS:   There were no vitals filed for this visit.      GEN:  The patient appears stated age and is in NAD. HEENT:  Normocephalic, atraumatic.  The mucous membranes are moist. The superficial temporal arteries are without ropiness or tenderness. CV:  RRR Lungs:  CTAB Neck/HEME:  There are no carotid bruits bilaterally.  Neurological examination:  Orientation: The patient is alert and oriented x3.  He looks to wife for finer aspects of the history.  There is intermittent confusion. Cranial nerves:  There is good facial symmety with facial hypomimia. The speech is fluent and clear. Soft palate rises symmetrically and there is no tongue deviation. Hearing is intact to conversational tone. Sensation: Sensation is intact to light touch throughout Motor: Strength is at least antigravity x4.  Movement examination: Tone: There is normal tone today in the bilateral upper extremities. Abnormal movements: none Coordination:  There is decremation with rapid alternating movements bilaterally.  There is also some apraxia when asked to perform these commands. Gait and Station: the patient nearly runs down the hall (festinates) and he nearly falls in the turn  I have reviewed and interpreted the following labs independently    Chemistry      Component Value Date/Time   NA 141 06/05/2022 0728   NA 142 07/18/2021 1044   K 3.9 06/05/2022 0728   CL 105 06/05/2022 0728   CO2 29 06/05/2022 0728   BUN 11 06/05/2022 0728   BUN 17 07/18/2021 1044   CREATININE 0.83 06/05/2022 0728      Component Value Date/Time   CALCIUM 8.6 (L) 06/05/2022 0728   ALKPHOS 59 06/03/2022 0933   AST 14 (L) 06/03/2022 0933   ALT <5 06/03/2022 0933   BILITOT 0.4 06/03/2022 0933       Lab Results  Component Value Date   WBC 6.8 06/05/2022    HGB 13.2 06/05/2022   HCT 40.1 06/05/2022   MCV 97.1 06/05/2022   PLT 302 06/05/2022    No results found for: "TSH"    Cc:  [name], MD  Total time spent on today's visit was *** minutes, including both face-to-face time and nonface-to-face time.  Time included that spent on review of records (prior notes available to me/labs/imaging if pertinent), discussing treatment and goals, answering patient's questions and coordinating care.

## 2022-07-26 DIAGNOSIS — Z029 Encounter for administrative examinations, unspecified: Secondary | ICD-10-CM

## 2022-08-03 DIAGNOSIS — J189 Pneumonia, unspecified organism: Secondary | ICD-10-CM | POA: Diagnosis not present

## 2022-08-09 NOTE — Progress Notes (Signed)
Assessment/Plan:   1.  Parkinsons Disease  -take Carbidopa/levodopa 25/100, 3 tablets at 6:30 AM/2 at 8:30 AM/2 at 10:30 AM/2 at 12:30 PM/2 at 2:30 PM/2 at 4:30pm/2 at 6:30pm.  Can take up to extra tablet (in 1/2 tabs) per day prn  -Continue carbidopa/levodopa 50/200 at bed.  -Patient had CT brain done that suggested ventricles slightly out of proportion to atrophy.  They have opted to not do lumbar puncture.  Have discussed several times at length with wife (and pt prior to dx of PDD)  -RX wheelchair at wife request given  2.  RBD             -Safety discussed             -Was on 0.25 mg of clonazepam in the past.  They discontinued as pharmacist told them it was addictive.     3.    Memory change/PDD             -Patient had neurocognitive testing with Dr. Melvyn Novas on Mar 21, 2021 and demonstrated PDD.  Patient actually did fairly well on the testing, but because of some troubles with ADLs and high level of education, they actually classified it with mild PDD.  However, I can see things have deteriorated and think that he may have had some progression   4.  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 Lovena Le.  Has had multiple episodes of syncope and 1 episode of probable convulsive syncope.  He had hypokalemia with high doses of Florinef.  The Florinef has had to be decreased to 0.1 mg twice per day per our records but wife thinks that he takes 3 po bid.  This would be very high dose and very unusual.  They don't have medication list from [facility] with them today.    -Increase midodrine, 10mg , 10 mg, 5 mg with meals.  Wife reports continued daily syncope on commode in the AM (even after AM meds)    Subjective:   Zachary [name] was seen today in follow up for Parkinsons disease.  My previous records were reviewed prior to todays visit as well as outside records available to me.  Patient seen with wife who supplements history.  We have had  several phone calls from the patient/family since our last visit.  Medical records have been reviewed regarding falls and hospitalizations for syncope.  He was hospitalized from August 1 until August 4 after a syncopal episode.  He apparently was working with physical therapy and then started to stare off and did not respond for a few minutes.  On arrival to the emergency room, his blood pressure was only in the 90s, but after fluids, his blood pressure rose up to 137/82.  Patient was sick at the time and had been taking Mucinex as an outpatient.  His potassium was only 2.1 in the emergency room.  He was admitted, treated for community-acquired pneumonia and had hypokalemia (felt to due to the fact that his home Florinef had been increased to 0.3 mg twice per day as an outpatient).  Because of this, his Florinef was decreased back to 0.1 twice per day.  He was back in the emergency room 2 days later after another syncopal episode.  EMS noted blood pressure of 90/60.  Patient was treated with fluids in the emergency room and discharged.  He was back in the emergency room again 2  days later with syncope again.  This time, following the syncopal episode, his head went back and eyes were rolling and he had shaking of the upper arms for 1 minute.  No changes were made at that emergency room visit to his medication regimen.  He saw the cardiology PA 2 days later.  It appears [name] had not been giving him his Florinef at all.  Recommendations were a little unclear.  Note stated "I have advised that until they get his increased Florinef dose available that they continue the prior dosing rather than none at all."  Wife states today that he is on 0.1 mg, THREE tablets bid.  No medication list is with them.    Separately, we have talked to patient/wife/family several times since last visit about ventriculomegaly and what it means and what it does not mean.  There has been much confusion over this finding on his  neuro imaging, in part because extended family has not been here, but wife has been very involved in the care for many years.  They have opted to not do lumbar puncture.  Pt did move and is at [facility].  Wife states that meds are distributed on time and pt is doing better.  Entacapone was d/c and pt is doing better.  Was having hallucinations prior to d/c.    No falls.  Pt been there 6 weeks and no falls in that time.  Wife states that he will still pass out in the AM when she gets pt up and puts him on the commode.    Current prescribed movement disorder medications: Carbidopa/levodopa 25/100, 3 tablets at 6:30 AM/2 at 8:30 AM/2 at 10:30 AM/2 at 12:30 PM/2 at 2:30 PM/2 at 4:30pm/2 at 6:30pm.   Carbidopa/levodopa 50/200 at bedtime Midodrine, 5 mg 3 times per day (added last visit) Florinef, 0.1 mg bid (he is on 3 po bid per wife)  PREVIOUS MEDICATIONS: amantadine; Azilect; Inbrija; carbidopa/levodopa 25/100; carbidopa/levodopa 50/200; Stalevo; clonazepam (was only on 0.25 mg at bedtime and they discontinued as pharmacist told them it was addictive); entacapone (d/c d/t hallucination)  ALLERGIES:   Allergies  Allergen Reactions   Pear Other (See Comments)    Unknown Not listed on MAR   Tree Extract Other (See Comments)    Unknown Not listed on MAR    CURRENT MEDICATIONS:  Outpatient Encounter Medications as of 08/10/2022  Medication Sig   AMBULATORY NON FORMULARY MEDICATION Transport chair Dx: Parkinsons Disease G20   carbidopa-levodopa (SINEMET CR) 50-200 MG tablet TAKE ONE TABLET BY MOUTH AT BEDTIME   carbidopa-levodopa (SINEMET IR) 25-100 MG tablet 3 tabs AT 6:30AM/2 at 8:30AM/2 at 10:30AM/2 at 12:30PM/2 at 2:30PM/2 at 4:30pm/2 at 6:30pm. take up to extra tablet (in 1/2 tabs) per day AS NEEDED   Cholecalciferol (DIALYVITE VITAMIN D 5000 PO) Take 1 tablet by mouth daily.   entacapone (COMTAN) 200 MG tablet Take 1 tablet (200 mg total) by mouth 3 (three) times daily  (8:30/12:30/4:30).   finasteride (PROSCAR) 5 MG tablet Take 5 mg by mouth daily.   fludrocortisone (FLORINEF) 0.1 MG tablet Take 1 tablet (0.1 mg total) by mouth 2 (two) times daily.   Lactobacillus (PROBIOTIC ACIDOPHILUS PO) Take 1 tablet by mouth daily.   midodrine (PROAMATINE) 5 MG tablet 2 at breakfast, 2 at lunch, 1 at dinner   Multiple Vitamins-Minerals (PRESERVISION AREDS 2 PO) Take 1 tablet by mouth daily.   mupirocin ointment (BACTROBAN) 2 % Apply 1 Application topically 2 (two) times daily.  potassium chloride SA (KLOR-CON M) 20 MEQ tablet Take 2 tablets (40 mEq total) by mouth 2 (two) times daily for 3 days.   [DISCONTINUED] midodrine (PROAMATINE) 5 MG tablet TAKE 1 TABLET BY MOUTH THREE TIMES DAILY WITH MEALS   No facility-administered encounter medications on file as of 08/10/2022.    Objective:   PHYSICAL EXAMINATION:    VITALS:   Vitals:   08/10/22 1345  BP: 108/72  Pulse: 72  SpO2: 99%  Weight: 166 lb 12.8 oz (75.7 kg)        GEN:  The patient appears stated age and is in NAD. HEENT:  Normocephalic, atraumatic.  The mucous membranes are moist. The superficial temporal arteries are without ropiness or tenderness. CV:  RRR Lungs:  CTAB Neck/HEME:  There are no carotid bruits bilaterally.  Neurological examination:  Orientation: The patient is alert and oriented x3.  He looks to wife for finer aspects of the history.  There is intermittent confusion. Cranial nerves: There is good facial symmety with facial hypomimia. The speech is fluent and clear. Soft palate rises symmetrically and there is no tongue deviation. Hearing is intact to conversational tone. Sensation: Sensation is intact to light touch throughout Motor: Strength is at least antigravity x4.  Movement examination: Tone: There is normal tone today in the bilateral upper extremities. Abnormal movements: none Coordination:  There is decremation with rapid alternating movements bilaterally.  There  is also some apraxia when asked to perform these commands. Gait and Station: requires assist [place].  Uses examiner as walker.  He is slow and has freezing in the turn.  I have reviewed and interpreted the following labs independently    Chemistry      Component Value Date/Time   NA 141 06/05/2022 0728   NA 142 07/18/2021 1044   K 3.9 06/05/2022 0728   CL 105 06/05/2022 0728   CO2 29 06/05/2022 0728   BUN 11 06/05/2022 0728   BUN 17 07/18/2021 1044   CREATININE 0.83 06/05/2022 0728      Component Value Date/Time   CALCIUM 8.6 (L) 06/05/2022 0728   ALKPHOS 59 06/03/2022 0933   AST 14 (L) 06/03/2022 0933   ALT <5 06/03/2022 0933   BILITOT 0.4 06/03/2022 0933       Lab Results  Component Value Date   WBC 6.8 06/05/2022   HGB 13.2 06/05/2022   HCT 40.1 06/05/2022   MCV 97.1 06/05/2022   PLT 302 06/05/2022    No results found for: "TSH"    Cc:  Ginger Organ., MD  Total time spent on today's visit was 42 minutes, including both face-to-face time and nonface-to-face time.  Time included that spent on review of records (prior notes available to me/labs/imaging if pertinent), discussing treatment and goals, answering patient's questions and coordinating care.

## 2022-08-10 ENCOUNTER — Encounter: Payer: Self-pay | Admitting: Neurology

## 2022-08-10 ENCOUNTER — Ambulatory Visit: Payer: Medicare PPO | Admitting: Neurology

## 2022-08-10 VITALS — BP 108/72 | HR 72 | Wt 166.8 lb

## 2022-08-10 DIAGNOSIS — F0280 Dementia in other diseases classified elsewhere without behavioral disturbance: Secondary | ICD-10-CM

## 2022-08-10 DIAGNOSIS — G903 Multi-system degeneration of the autonomic nervous system: Secondary | ICD-10-CM

## 2022-08-10 DIAGNOSIS — G20A1 Parkinson's disease without dyskinesia, without mention of fluctuations: Secondary | ICD-10-CM

## 2022-08-10 DIAGNOSIS — G20A2 Parkinson's disease without dyskinesia, with fluctuations: Secondary | ICD-10-CM | POA: Diagnosis not present

## 2022-08-10 MED ORDER — MIDODRINE HCL 5 MG PO TABS: ORAL_TABLET | ORAL | 1 refills | Status: DC

## 2022-08-10 NOTE — Patient Instructions (Addendum)
Take midodrine 10 mg with breakfast, 10 mg with lunch, 5 mg at dinner  Local and Online Resources for Power over Parkinson's Group  October 2023    LOCAL [place] PARKINSON'S GROUPS   Power over Parkinson's Group:    Power Over Parkinson's Patient Education Group will be Wednesday, October 11th-*Hybrid meting*- in person at [facility] location and via [facility] at 2 pm.   Upcoming Power over [employer] Meetings:  2nd Wednesdays of the month at 2 pm:   October 11th, November 8th, December 13th  Contact Amy Marriott at [email or web address] if interested in participating in this group    [place]! Moves [employer] Instructor-Led Classes offering at [employer]!  TUESDAYS and Wednesdays 1-2 pm.   Contact Vonna Kotyk at  [email or web address] or Caron Presume at [place], [email or web address]  Dance for Parkinson 's classes will be on Tuesdays 9:30am-10:30am starting October 3-December 12 with a break the week of November 21st. Located in the [employer]  which is in the first floor of the [employer] ([place].) To register:  [email or web address] or [phone]  Drumming for Parkinson's will be held on 2nd and 4th Mondays at 11:00 am.   Located at the [place] ([place]  [place] at [email or web address] or [phone]  Through support from the [place] for Parkinson's classes are free for both patients and caregivers.    [employer] Parkinson's Tai Chi Class, Mondays at 11 am.  Call [phone] for details   [place]:  [email or web address]  PD Health at Home continues:  Mindfulness Mondays, Wellness Wednesdays, Fitness Fridays   Upcoming Education:    Parkinson's 101:  What you and your family should know.  Wednesday, Oct. 4th 1-2 pm   Expert Briefing:     Parkinson's and the Gut-Brain Connection.  Wednesday, Oct. 11th 1-2 pm  Hallucinations and Delusions in Parkinson's.  Wednesday, Nov. 8th, 1-2 pm  Register for expert briefings (webinars) at [email or web address]  Please check out their website to sign up for emails and see their full online offerings      [place]:  [email or web address]   Third Thursday Webinars:  On the third Thursday of every month at 12 p.m. ET, join our free live webinars to learn about various aspects of living with Parkinson's disease and our work to speed medical breakthroughs.  Upcoming Webinar:  Surveyor, mining for [employer]. (Replay).  Thursday, Oct. 12th at 12 noon  Check out additional information on their website to see their full online offerings    [place]:  [email or web address]  Upcoming Webinar:   Stay tuned  Webinar Series:  Living with Parkinson's Meetup.   Third Thursdays each month, 3 pm  Care Partner Monthly Meetup.  With Robin Searing Phinney.  First Tuesday of each month, 2 pm  Check out additional information to Live Well Today on their website    Parkinson and Movement Disorders (PMD) Alliance:  www.[email or web address]  [employer]Life Online:  Online Education Events  Sign up for emails, which are sent weekly to give you updates on programming and online offerings    Parkinson's Association of the [place]:  www.[email or web address]  Information on online support groups, education events, and online exercises including Yoga, Parkinson's exercises and more-LOTS of information on links to PD resources and online events  Virtual Support Group through Parkinson's  Association of the [facility]; next one is scheduled for Wednesday, October 4th at 2 pm.  (These are typically scheduled for the 1st Wednesday of the month at 2 pm).  Visit website for  details.   MOVEMENT AND EXERCISE OPPORTUNITIES  PWR! Moves Classes at [place].  Wednesdays 10 and 11 am.   Contact Amy Marriott, PT [email or web address] if interested.  NEW PWR! Moves Class offerings at [employer].  *TUESDAYS* and Wednesdays 1-2 pm.  Contact Vonna Kotyk at  [email or web address] or Caron Presume at [place],  [email or web address]  Parkinson's Wellness Recovery (PWR! Moves)  [email or web address]  Info on the PWR! Virtual Experience:  You will have access to our expertise?through self-assessment, guided plans that start with the PD-specific fundamentals, educational content, tips, Q&A with an expert, and a growing Art therapist of PD-specific pre-recorded and live exercise classes of varying types and intensity - both physical and cognitive! If that is not enough, we offer 1:1 wellness consultations (in-person or virtual) to personalize your PWR! Research scientist (medical).   [place] Fridays:   As part of the PD Health @ Home program, this free video series focuses each week on one aspect of fitness designed to support people living with Parkinson's.? These weekly videos highlight the [place] fitness guidelines for people with Parkinson's disease.  [email or web address]  Dance for PD website is offering free, live-stream classes throughout the week, as well as links to [employer] of classes:  [email or web address]  Virtual dance and Pilates for Parkinson's classes: Click on the Community Tab> Parkinson's Movement Initiative Tab.  To register for classes and for more information, visit [email or web address] and click the "community" tab.   [employer] Parkinson's Cycling Classes   [facility]:  Thursdays @ Noon-Live classes at [employer] ([employer] at [email or web address]?or [phone])  [facility]: Virtual Classes Mondays and Thursdays  Jeanette Caprice classes Tuesday, Wednesday and Thursday (contact [place] at [email or web address] ?or [phone])  [place]  Varied levels of classes are offered Tuesdays and Thursdays at [employer].   Stretching with Verdis Frederickson weekly class is also offered for people with Parkinson's  To observe a class or for more information, call [phone] or email Hezzie Bump at [email or web address]   ADDITIONAL SUPPORT AND RESOURCES  [employer]:Online Caregiver Education Opportunities:  www.well-[email or web address]/caregiver-education/caregiver-support-group.  You may also contact Vickki Muff at [email or web address] or [phone].     [employer] Navigator:  Just1Navigator program, a?free service to help individuals and families through the journey of determining care for older adults.  The "Navigator" is a Education officer, museum, Arnell Asal, who will speak with a prospective client and/or loved ones to provide an assessment of the situation and a set of recommendations for a personalized care plan -- all free of charge, and whether?[employer] offers the needed service or not. If the need is not a service we provide, we are well-connected with reputable programs in town that we can refer you to.  www.well-[email or web address] or to speak with the Navigator, call [phone].

## 2022-08-12 ENCOUNTER — Encounter: Payer: Self-pay | Admitting: Neurology

## 2022-08-13 ENCOUNTER — Other Ambulatory Visit: Payer: Self-pay

## 2022-08-13 DIAGNOSIS — G20A1 Parkinson's disease without dyskinesia, without mention of fluctuations: Secondary | ICD-10-CM

## 2022-08-13 MED ORDER — ENTACAPONE 200 MG PO TABS: ORAL_TABLET | ORAL | 0 refills | Status: DC

## 2022-08-30 ENCOUNTER — Other Ambulatory Visit: Payer: Self-pay | Admitting: Neurology

## 2022-08-30 ENCOUNTER — Other Ambulatory Visit: Payer: Self-pay

## 2022-08-30 MED ORDER — AMBULATORY NON FORMULARY MEDICATION: 0 refills | Status: DC

## 2022-09-12 DIAGNOSIS — G20A1 Parkinson's disease without dyskinesia, without mention of fluctuations: Secondary | ICD-10-CM | POA: Diagnosis not present

## 2022-09-12 DIAGNOSIS — K5909 Other constipation: Secondary | ICD-10-CM | POA: Diagnosis not present

## 2022-09-12 DIAGNOSIS — G3184 Mild cognitive impairment, so stated: Secondary | ICD-10-CM | POA: Diagnosis not present

## 2022-09-12 DIAGNOSIS — R55 Syncope and collapse: Secondary | ICD-10-CM | POA: Diagnosis not present

## 2022-09-12 DIAGNOSIS — H9190 Unspecified hearing loss, unspecified ear: Secondary | ICD-10-CM | POA: Diagnosis not present

## 2022-09-12 DIAGNOSIS — J189 Pneumonia, unspecified organism: Secondary | ICD-10-CM | POA: Diagnosis not present

## 2022-09-12 DIAGNOSIS — N3941 Urge incontinence: Secondary | ICD-10-CM | POA: Diagnosis not present

## 2022-09-12 DIAGNOSIS — R351 Nocturia: Secondary | ICD-10-CM | POA: Diagnosis not present

## 2022-10-08 ENCOUNTER — Ambulatory Visit: Payer: Medicare PPO | Attending: Internal Medicine | Admitting: Internal Medicine

## 2022-10-08 ENCOUNTER — Encounter: Payer: Self-pay | Admitting: Internal Medicine

## 2022-10-08 VITALS — BP 124/84 | HR 56 | Ht 69.5 in | Wt 172.0 lb

## 2022-10-08 DIAGNOSIS — G20A1 Parkinson's disease without dyskinesia, without mention of fluctuations: Secondary | ICD-10-CM

## 2022-10-08 DIAGNOSIS — R55 Syncope and collapse: Secondary | ICD-10-CM

## 2022-10-08 NOTE — Patient Instructions (Signed)

## 2022-10-08 NOTE — Progress Notes (Signed)
HPI [name] returns today for ongoing evaluation of syncope. He has a long h/o Parkinsons and has begun to fall over the last 3-4 years. Occaisionally the episodes are associated with a loss of consciousness. He has stopped consuming ETOH and caffeine. He denies chest pain or sob. He has become more sedentary. His bp is better on the combination of midodrine and florinef.  His florinef dose was recently increased to 0.3 mg twice daily. His bp is better. Allergies  Allergen Reactions   Pear Other (See Comments)    Unknown Not listed on MAR   Tree Extract Other (See Comments)    Unknown Not listed on MAR     Current Outpatient Medications  Medication Sig Dispense Refill   AMBULATORY NON FORMULARY MEDICATION Transport chair Dx: Parkinsons Disease G20 1 Device 0   AMBULATORY NON FORMULARY MEDICATION DISCONTINUE ENTACAPONE 1 Device 0   carbidopa-levodopa (SINEMET CR) 50-200 MG tablet TAKE ONE TABLET BY MOUTH AT BEDTIME 90 tablet 0   carbidopa-levodopa (SINEMET IR) 25-100 MG tablet 3 tabs AT 6:30AM/2 at 8:30AM/2 at 10:30AM/2 at 12:30PM/2 at 2:30PM/2 at 4:30pm/2 at 6:30pm. take up to extra tablet (in 1/2 tabs) per day AS NEEDED 1375 tablet 1   Cholecalciferol (DIALYVITE VITAMIN D 5000 PO) Take 1 tablet by mouth daily.     finasteride (PROSCAR) 5 MG tablet Take 5 mg by mouth daily.     fludrocortisone (FLORINEF) 0.1 MG tablet Take 1 tablet (0.1 mg total) by mouth 2 (two) times daily. 360 tablet 3   Lactobacillus (PROBIOTIC ACIDOPHILUS PO) Take 1 tablet by mouth daily.     midodrine (PROAMATINE) 5 MG tablet 2 at breakfast, 2 at lunch, 1 at dinner 450 tablet 1   Multiple Vitamins-Minerals (PRESERVISION AREDS 2 PO) Take 1 tablet by mouth daily.     mupirocin ointment (BACTROBAN) 2 % Apply 1 Application topically 2 (two) times daily.     potassium chloride SA (KLOR-CON M) 20 MEQ tablet Take 2 tablets (40 mEq total) by mouth 2 (two) times daily for 3 days. 12 tablet 0   No current  facility-administered medications for this visit.     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   Past Surgical History:  Procedure Laterality Date   CATARACT EXTRACTION, BILATERAL     COLONOSCOPY     SHOULDER SURGERY Left    lymph nodes   TONSILLECTOMY     VASECTOMY       Family History  Problem Relation Age of Onset   Alzheimer's disease Mother    Heart disease Father    Tremor Father    Stroke Maternal Grandmother    Diabetes Child      Social History   Socioeconomic History   Marital status: Married    Spouse name: Zachary [name]   Number of children: 3   Years of education: 20   Highest education level: Doctorate  Occupational History   Occupation: Retired    Associate Professor: [employer]    Comment: professor - Nurse, children's  Tobacco Use  Smoking status: Never   Smokeless tobacco: Never  Vaping Use   Vaping Use: Never used  Substance and Sexual Activity   Alcohol use: Not Currently   Drug use: No    Comment: hemp oil   Sexual activity: Not on file  Other Topics Concern   Not on file  Social History Narrative   Pt lives at home with family.   Caffeine Use: 1-2 cups daily.   Right handed   Social Determinants of Health   Financial Resource Strain: Not on file  Food Insecurity: Not on file  Transportation Needs: Not on file  Physical Activity: Not on file  Stress: Not on file  Social Connections: Not on file  Intimate Partner Violence: Not on file     BP 124/84   Pulse (!) 56   Ht 5' 9.5" (1.765 m)   Wt 172 lb (78 kg)   SpO2 96%   BMI 25.04 kg/m   Physical  Exam:  Well appearing NAD HEENT: Unremarkable Neck:  No JVD, no thyromegally Lymphatics:  No adenopathy Back:  No CVA tenderness Lungs:  Clear HEART:  Regular rate rhythm, no murmurs, no rubs, no clicks Abd:  soft, positive bowel sounds, no organomegally, no rebound, no guarding Ext:  2 plus pulses, no edema, no cyanosis, no clubbing Skin:  No rashes no nodules Neuro:  CN II through XII intact, motor grossly intact   Assess/Plan:  1.Syncope - 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 I asked him to increase his salt intake. He has improved on high dose florinef. He has had his bmp rechecked. I asked him to uptitrate his florinef to 0.2 mg bid. No indication for Northera at this point.  2. Parkinson's - his neuromuscular symptoms appear to be fairly well controlled. No tremor today.   Zachary Gowda [name],MD

## 2022-11-02 DIAGNOSIS — M6281 Muscle weakness (generalized): Secondary | ICD-10-CM | POA: Diagnosis not present

## 2022-11-02 DIAGNOSIS — G20C Parkinsonism, unspecified: Secondary | ICD-10-CM | POA: Diagnosis not present

## 2022-11-02 DIAGNOSIS — R2681 Unsteadiness on feet: Secondary | ICD-10-CM | POA: Diagnosis not present

## 2022-11-06 ENCOUNTER — Ambulatory Visit: Payer: Medicare PPO | Admitting: Neurology

## 2022-11-07 DIAGNOSIS — M6281 Muscle weakness (generalized): Secondary | ICD-10-CM | POA: Diagnosis not present

## 2022-11-07 DIAGNOSIS — G20C Parkinsonism, unspecified: Secondary | ICD-10-CM | POA: Diagnosis not present

## 2022-11-07 DIAGNOSIS — R2681 Unsteadiness on feet: Secondary | ICD-10-CM | POA: Diagnosis not present

## 2022-11-09 DIAGNOSIS — G20C Parkinsonism, unspecified: Secondary | ICD-10-CM | POA: Diagnosis not present

## 2022-11-09 DIAGNOSIS — M6281 Muscle weakness (generalized): Secondary | ICD-10-CM | POA: Diagnosis not present

## 2022-11-09 DIAGNOSIS — R2681 Unsteadiness on feet: Secondary | ICD-10-CM | POA: Diagnosis not present

## 2022-11-14 DIAGNOSIS — G20C Parkinsonism, unspecified: Secondary | ICD-10-CM | POA: Diagnosis not present

## 2022-11-14 DIAGNOSIS — M6281 Muscle weakness (generalized): Secondary | ICD-10-CM | POA: Diagnosis not present

## 2022-11-14 DIAGNOSIS — R2681 Unsteadiness on feet: Secondary | ICD-10-CM | POA: Diagnosis not present

## 2022-11-16 DIAGNOSIS — R2681 Unsteadiness on feet: Secondary | ICD-10-CM | POA: Diagnosis not present

## 2022-11-16 DIAGNOSIS — M6281 Muscle weakness (generalized): Secondary | ICD-10-CM | POA: Diagnosis not present

## 2022-11-16 DIAGNOSIS — G20C Parkinsonism, unspecified: Secondary | ICD-10-CM | POA: Diagnosis not present

## 2022-11-18 DIAGNOSIS — R2681 Unsteadiness on feet: Secondary | ICD-10-CM | POA: Diagnosis not present

## 2022-11-18 DIAGNOSIS — G20C Parkinsonism, unspecified: Secondary | ICD-10-CM | POA: Diagnosis not present

## 2022-11-18 DIAGNOSIS — M6281 Muscle weakness (generalized): Secondary | ICD-10-CM | POA: Diagnosis not present

## 2022-11-23 DIAGNOSIS — M6281 Muscle weakness (generalized): Secondary | ICD-10-CM | POA: Diagnosis not present

## 2022-11-23 DIAGNOSIS — G20C Parkinsonism, unspecified: Secondary | ICD-10-CM | POA: Diagnosis not present

## 2022-11-23 DIAGNOSIS — R2681 Unsteadiness on feet: Secondary | ICD-10-CM | POA: Diagnosis not present

## 2022-11-27 DIAGNOSIS — M6281 Muscle weakness (generalized): Secondary | ICD-10-CM | POA: Diagnosis not present

## 2022-11-27 DIAGNOSIS — G20C Parkinsonism, unspecified: Secondary | ICD-10-CM | POA: Diagnosis not present

## 2022-11-27 DIAGNOSIS — R2681 Unsteadiness on feet: Secondary | ICD-10-CM | POA: Diagnosis not present

## 2022-12-28 ENCOUNTER — Other Ambulatory Visit: Payer: Self-pay | Admitting: Neurology

## 2022-12-28 DIAGNOSIS — G20A2 Parkinson's disease without dyskinesia, with fluctuations: Secondary | ICD-10-CM

## 2023-01-28 NOTE — Progress Notes (Unsigned)
Assessment/Plan:   1.  Parkinsons Disease  -take Carbidopa/levodopa 25/100, 3 tablets at 6:30 AM/2 at 8:30 AM/2 at 10:30 AM/2 at 12:30 PM/2 at 2:30 PM/2 at 4:30pm/2 at 6:30pm.  Can take up to extra tablet (in 1/2 tabs) per day prn  -okay to take entacapone 200 mg with 8:30am/12:30pm/4:30pm dose but we will need to watch cognition with time  -Continue carbidopa/levodopa 50/200 at bed.  -Patient had CT brain done that suggested ventricles slightly out of proportion to atrophy.  They have opted to not do lumbar puncture.  2.  RBD             -Safety discussed             -Was on 0.25 mg of clonazepam in the past.  They discontinued as pharmacist told them it was addictive.     3.    Memory change/PDD             -Patient had neurocognitive testing with Dr. Melvyn Novas on Mar 21, 2022 and demonstrated PDD.  Patient actually did fairly well on the testing, but because of some troubles with ADLs and high level of education, they actually classified it with mild PDD  -They are discussing changing living situation, with having patient going to [name] while his wife lives independently.   4.  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 Lovena Le.  Has had multiple episodes of syncope and 1 episode of probable convulsive syncope.  He had hypokalemia with high doses of Florinef.  The Florinef has had to be decreased to 0.1 mg twice per day.  There have been issues with [name] giving him this medication as well, as apparently they did not have the right dosage.  -I think his midodrine has plenty of room to go up.    Subjective:   Zachary [name] was seen today in follow up for Parkinsons disease.  My previous records were reviewed prior to todays visit as well as outside records available to me.  Patient seen with wife who supplements history.   It has actually been 1 year since I have seen the patient.  Medical records have been reviewed regarding  falls and hospitalizations for syncope.  He was hospitalized from August 1 until August 4 after a syncopal episode.  He apparently was working with physical therapy and then started to stare off and did not respond for a few minutes.  On arrival to the emergency room, his blood pressure was only in the 90s, but after fluids, his blood pressure rose up to 137/82.  Patient was sick at the time and had been taking Mucinex as an outpatient.  His potassium was only 2.1 in the emergency room.  He was admitted, treated for community-acquired pneumonia and his hypokalemia (felt to due to the fact that his home Florinef had been increased to 0.3 mg twice per day as an outpatient).  Because of this, his Florinef was decreased back to 0.1 twice per day.  He was back in the emergency room 2 days later after another syncopal episode.  EMS noted blood pressure of 90/60.  Patient was treated with fluids in the emergency room and discharged.  He was back in the emergency room again 2 days later with syncope again.  This time, following the syncopal episode, his head went back and eyes were rolling and he had shaking of the upper arms  for 1 minute.  No changes were made at that emergency room visit to his medication regimen.  He saw the cardiology PA 2 days later.  It appears [name] had not been giving him his Florinef at all.  Recommendations were a little unclear.  Note stated "I have advised that until they get his increased Florinef dose available that they continue the prior dosing rather than none at all."  He has since followed up with Dr. Lovena Le.  He last saw him December 11.  Dr. Lovena Le noted that they had recently increased the Florinef to 0.3 mg twice per day and patient was doing better.  Separately, we have talked to patient/wife/family several times since last visit about ventriculomegaly and what it means and what it does not mean.  There has been much confusion over this finding on his neuro imaging, in  part because extended family has not been here, but wife has been very involved in the care for many years.  They have opted to not do lumbar puncture.    Current prescribed movement disorder medications: Carbidopa/levodopa 25/100, 3 tablets at 6:30 AM/2 at 8:30 AM/2 at 10:30 AM/2 at 12:30 PM/2 at 2:30 PM/2 at 4:30pm/2 at 6:30pm.   Carbidopa/levodopa 50/200 at bedtime Entacapone, 200 mg, 1 with 8:30 AM/12:30 PM/4:30 PM dose  Midodrine, 5 mg 3 times per day  Florinef 0.3 mg twice per day.  PREVIOUS MEDICATIONS: amantadine; Azilect; Inbrija; carbidopa/levodopa 25/100; carbidopa/levodopa 50/200; Stalevo; clonazepam (was only on 0.25 mg at bedtime and they discontinued as pharmacist told them it was addictive)  ALLERGIES:   Allergies  Allergen Reactions   Pear Other (See Comments)    Unknown Not listed on MAR   Tree Extract Other (See Comments)    Unknown Not listed on MAR    CURRENT MEDICATIONS:  Outpatient Encounter Medications as of 01/29/2023  Medication Sig   AMBULATORY NON FORMULARY MEDICATION Transport chair Dx: Parkinsons Disease G20   AMBULATORY NON FORMULARY MEDICATION DISCONTINUE ENTACAPONE   carbidopa-levodopa (SINEMET CR) 50-200 MG tablet TAKE ONE TABLET BY MOUTH AT BEDTIME   carbidopa-levodopa (SINEMET IR) 25-100 MG tablet 3 tabs AT 6:30AM/2 at 8:30AM/2 at 10:30AM/2 at 12:30PM/2 at 2:30PM/2 at 4:30pm/2 at 6:30pm. take up to extra tablet (in 1/2 tabs) per day AS NEEDED   Cholecalciferol (DIALYVITE VITAMIN D 5000 PO) Take 1 tablet by mouth daily.   finasteride (PROSCAR) 5 MG tablet Take 5 mg by mouth daily.   fludrocortisone (FLORINEF) 0.1 MG tablet Take 1 tablet (0.1 mg total) by mouth 2 (two) times daily.   Lactobacillus (PROBIOTIC ACIDOPHILUS PO) Take 1 tablet by mouth daily.   midodrine (PROAMATINE) 5 MG tablet TAKE (2) TABLETS BY MOUTH TWICE DAILY AT 8AM & 12PM. TAKE (1) TABLET BY MOUTH AT DINNER.   Multiple Vitamins-Minerals (PRESERVISION AREDS 2 PO) Take 1 tablet by  mouth daily.   mupirocin ointment (BACTROBAN) 2 % Apply 1 Application topically 2 (two) times daily.   potassium chloride SA (KLOR-CON M) 20 MEQ tablet Take 2 tablets (40 mEq total) by mouth 2 (two) times daily for 3 days.   No facility-administered encounter medications on file as of 01/29/2023.    Objective:   PHYSICAL EXAMINATION:    VITALS:   There were no vitals filed for this visit.       GEN:  The patient appears stated age and is in NAD. HEENT:  Normocephalic, atraumatic.  The mucous membranes are moist. The superficial temporal arteries are without ropiness or tenderness. CV:  RRR Lungs:  CTAB Neck/HEME:  There are no carotid bruits bilaterally.  Neurological examination:  Orientation: The patient is alert and oriented x3.  He looks to wife for finer aspects of the history.  There is intermittent confusion. Cranial nerves: There is good facial symmety with facial hypomimia. The speech is fluent and clear. Soft palate rises symmetrically and there is no tongue deviation. Hearing is intact to conversational tone. Sensation: Sensation is intact to light touch throughout Motor: Strength is at least antigravity x4.  Movement examination: Tone: There is normal tone today in the bilateral upper extremities. Abnormal movements: none Coordination:  There is decremation with rapid alternating movements bilaterally.  There is also some apraxia when asked to perform these commands. Gait and Station: requires assist Puckett.  Uses examiner as walker.  He is slow and has freezing in the turn.  I have reviewed and interpreted the following labs independently    Chemistry      Component Value Date/Time   NA 141 06/05/2022 0728   NA 142 07/18/2021 1044   K 3.9 06/05/2022 0728   CL 105 06/05/2022 0728   CO2 29 06/05/2022 0728   BUN 11 06/05/2022 0728   BUN 17 07/18/2021 1044   CREATININE 0.83 06/05/2022 0728      Component Value Date/Time   CALCIUM 8.6 (L) 06/05/2022 0728    ALKPHOS 59 06/03/2022 0933   AST 14 (L) 06/03/2022 0933   ALT <5 06/03/2022 0933   BILITOT 0.4 06/03/2022 0933       Lab Results  Component Value Date   WBC 6.8 06/05/2022   HGB 13.2 06/05/2022   HCT 40.1 06/05/2022   MCV 97.1 06/05/2022   PLT 302 06/05/2022    No results found for: "TSH"    Cc:  Ginger Organ., MD  Total time spent on today's visit was 42 minutes, including both face-to-face time and nonface-to-face time.  Time included that spent on review of records (prior notes available to me/labs/imaging if pertinent), discussing treatment and goals, answering patient's questions and coordinating care.

## 2023-01-29 ENCOUNTER — Ambulatory Visit: Payer: Medicare PPO | Admitting: Neurology

## 2023-01-29 VITALS — BP 128/78 | HR 62 | Ht 69.0 in | Wt 166.0 lb

## 2023-01-29 DIAGNOSIS — R1310 Dysphagia, unspecified: Secondary | ICD-10-CM

## 2023-01-29 DIAGNOSIS — G20A2 Parkinson's disease without dyskinesia, with fluctuations: Secondary | ICD-10-CM

## 2023-01-29 DIAGNOSIS — G903 Multi-system degeneration of the autonomic nervous system: Secondary | ICD-10-CM | POA: Diagnosis not present

## 2023-01-29 DIAGNOSIS — R55 Syncope and collapse: Secondary | ICD-10-CM

## 2023-01-29 NOTE — Patient Instructions (Signed)
Good to see you!  Local and Online Resources for Power over Parkinson's Group  April 2024   LOCAL [place] PARKINSON'S GROUPS   Power over Parkinson's Group:    Power Over Parkinson's Patient Education Group will be Wednesday, April 10th-*Hybrid meting*- in person at [facility] location and via [facility], 2:00-3:00 pm.   Power over [employer] and Care Partner Groups will meet together, with plans for separate break out session for caregivers, depending on topic/speaker Upcoming Power over Parkinson's Meetings/Care Partner Support:  2nd Wednesdays of the month at 2 pm:   April 10th, May 8th [place] at [email or web address] if interested in participating in this group    [place] OFFERINGS  NEW:  Parkinson's Social Game Night.  First Thursday of each month, 2:00-4:00 pm.  *Next date is April 4th*.  [place], [employer].  Contact [email or web address] if interested. Parkinson's CarePartner Group for Men is in the works, if interested email [email or web address] ACT FITNESS Chair Yoga classes "Train and Gain", Fridays 10 am, ACT [employer]itness.  Contact Gina at [phone].  Mining engineer Classes offering at [employer]!  TUESDAYS (Chair Yoga)  and Wednesdays (PWR! Moves)  1:00 pm.   Contact Vonna Kotyk at  [email or web address]  or [phone]  Drumming for Parkinson's will be held on 2nd and 4th Mondays at 11:00 am.   Located at the [place] ([place].)  Contact Doylene Canning at [email or web address] or [phone]  Dance for Parkinson 's classes will be on Tuesdays 10-11 am. Located in the [employer] , in the first floor of the [employer] ([place].) To register:  [email or web address] or [place] Class, Mondays at 11 am.  Call  [phone] for details Hamil-Kerr Challenge.  Bike, Run, [employer] for [employer].  Saturday, April 6th at [facility].  To register, visit [email or web address] Moving Day [facility].  Saturday, May 4th, 10 am start.  Register at [email or web address].[place]:  [email or web address]  PD Health at Home continues:  Mindfulness Mondays, Wellness Wednesdays, Fitness Fridays  (PWR! Moves as part of Fitness Fridays March 22nd, 1-1:45 pm) Upcoming Education:   Parkinson's 101.  Wednesday, April 3rd, 1-2 pm Movement for Parkinson's.  Wednesday, May 1st, 1-2 pm Expert Briefing:  Research Update:  Working to [employer] PD.  Wednesday, April 10th, 1-2 pm Trouble with Zzz's:  Sleep Challenges with Parkinson's.  Wed, May 8th 1-2 pm Register for virtual education and expert briefings (webinars) at [email or web address] Please check out their website to sign up for emails and see their full online offerings     [place]:  [email or web address]   Third Thursday Webinars:  On the third Thursday of every month at 12 p.m. ET, join our free live webinars to learn about various aspects of living with Parkinson's disease and our work to speed medical breakthroughs.  Upcoming Webinar:  Let's Talk Taboos:  Hard-to-Discuss Parkinson's Symptoms.  Thursday, April 18th at 12 noon. Check out additional information on their website to see their full online offerings    [facility]:  [email or web address]  Upcoming Webinar:   Emergent Therapies.  Wednesday, April 2nd, 4 pm Series:  Living with Parkinson's Meetup.   Third Thursdays each month, 3 pm  Care Partner Monthly Meetup.  With Robin Searing Phinney.  First Tuesday of each month, 2  pm  Check out additional information to Live Well Today on their website    Parkinson and Movement Disorders (PMD) Alliance:  www.[email or web address]   NeuroLife Online:  Online Education Events  Sign up for emails, which are sent weekly to give you updates on programming and online offerings    Parkinson's Association of the [place]:  www.[email or web address]  Information on online support groups, education events, and online exercises including Yoga, Parkinson's exercises and more-LOTS of information on links to PD resources and online events  Virtual Support Group through Parkinson's Association of the [place]; next one is scheduled for Wednesday, April 3rd  MOVEMENT AND EXERCISE OPPORTUNITIES  PWR! Moves Classes at [place].  Wednesdays 10 and 11 am.   Contact Amy Marriott, PT [email or web address] if interested.  Parkinson's Exercise Class offerings at [employer]. *TUESDAYS* (Chair yoga) and Wednesdays (PWR! Moves)  1:00 pm.    Contact Vonna Kotyk at [email or web address]    Parkinson's Wellness Recovery (PWR! Moves)  [email or web address]  Info on the PWR! Virtual Experience:  You will have access to our expertise?through self-assessment, guided plans that start with the PD-specific fundamentals, educational content, tips, Q&A with an expert, and a growing Art therapist of PD-specific pre-recorded and live exercise classes of varying types and intensity - both physical and cognitive! If that is not enough, we offer 1:1 wellness consultations (in-person or virtual) to personalize your PWR! Research scientist (medical).   [place] Fridays:   As part of the PD Health @ Home program, this free video series focuses each week on one aspect of fitness designed to support people living with Parkinson's.? These weekly videos highlight the [employer] fitness guidelines for people with Parkinson's disease.  ModemGamers.si  Dance for PD website is offering free, live-stream classes throughout the week, as well as links to [employer] of  classes:  [email or web address]  Virtual dance and Pilates for Parkinson's classes: Click on the Community Tab> Parkinson's Movement Initiative Tab.  To register for classes and for more information, visit [email or web address] and click the "community" tab.   [employer] Parkinson's Cycling Classes   [employer]:  Thursdays @ Noon-Live classes at [employer] ([employer] at [email or web address]?or [phone])  [employer]: Classes Tuesday, Wednesday and Thursday (contact [place] at [email or web address] ?or [phone])  [place]  Varied levels of classes are offered Tuesdays and Thursdays at [employer].   Stretching with Verdis Frederickson weekly class is also offered for people with Parkinson's  To observe a class or for more information, call [phone] or email Hezzie Bump at [email or web address]   ADDITIONAL SUPPORT AND RESOURCES  [employer]:  Online Caregiver Education Opportunities:  www.well-springsolutions.org/caregiver-education/caregiver-support-group.  You may also contact Vickki Muff at [email or web address] or [phone].     [employer] [employer] April May Decisions Day:  The Most Critical Legal and Medical Decisions to Consider Now!  Tuesday, April 16th, 1-3 pm at [facility] at [facility].  Contact [facility] at [email or web address] or [phone] Powerful Tools for Caregivers.  6 week educational series for caregivers.  April 18-May 23, 10:30 am-12:15 pm at [facility] 3rd Floor Conference Room.   Contact 03-02-1995 at [email or web address] or [phone] to register Well-Spring Navigator:  Just1Navigator program, a?free service to help individuals and families through the journey of determining care for older adults.  The "Navigator" is a [facility], 12-29-1990, who will speak with a prospective client and/or  loved ones to provide an assessment of the situation and a set of  recommendations for a personalized care plan -- all free of charge, and whether?[employer] offers the needed service or not. If the need is not a service we provide, we are well-connected with reputable programs in town that we can refer you to.  www.well-[email or web address] or to speak with the Navigator, call [phone].

## 2023-02-09 DIAGNOSIS — R269 Unspecified abnormalities of gait and mobility: Secondary | ICD-10-CM | POA: Diagnosis not present

## 2023-02-09 DIAGNOSIS — G20C Parkinsonism, unspecified: Secondary | ICD-10-CM | POA: Diagnosis not present

## 2023-02-09 DIAGNOSIS — M6281 Muscle weakness (generalized): Secondary | ICD-10-CM | POA: Diagnosis not present

## 2023-02-11 DIAGNOSIS — R269 Unspecified abnormalities of gait and mobility: Secondary | ICD-10-CM | POA: Diagnosis not present

## 2023-02-11 DIAGNOSIS — M6281 Muscle weakness (generalized): Secondary | ICD-10-CM | POA: Diagnosis not present

## 2023-02-11 DIAGNOSIS — G20C Parkinsonism, unspecified: Secondary | ICD-10-CM | POA: Diagnosis not present

## 2023-02-14 DIAGNOSIS — R41841 Cognitive communication deficit: Secondary | ICD-10-CM | POA: Diagnosis not present

## 2023-02-14 DIAGNOSIS — R499 Unspecified voice and resonance disorder: Secondary | ICD-10-CM | POA: Diagnosis not present

## 2023-02-14 DIAGNOSIS — I69828 Other speech and language deficits following other cerebrovascular disease: Secondary | ICD-10-CM | POA: Diagnosis not present

## 2023-02-18 ENCOUNTER — Telehealth: Payer: Self-pay | Admitting: Anesthesiology

## 2023-02-18 DIAGNOSIS — G20C Parkinsonism, unspecified: Secondary | ICD-10-CM | POA: Diagnosis not present

## 2023-02-18 DIAGNOSIS — R269 Unspecified abnormalities of gait and mobility: Secondary | ICD-10-CM | POA: Diagnosis not present

## 2023-02-18 DIAGNOSIS — M6281 Muscle weakness (generalized): Secondary | ICD-10-CM | POA: Diagnosis not present

## 2023-02-18 MED ORDER — DIVALPROEX SODIUM ER 500 MG PO TB24: 500.0000 mg | ORAL_TABLET | Freq: Every day | ORAL | 1 refills | Status: DC

## 2023-02-18 NOTE — Telephone Encounter (Signed)
Called [name] no answer left voicemail

## 2023-02-18 NOTE — Telephone Encounter (Signed)
It was sent to the pharmacy

## 2023-02-18 NOTE — Telephone Encounter (Signed)
Pt's wife returned call  

## 2023-02-18 NOTE — Telephone Encounter (Signed)
Patient being non compliant with caretakers flopping around and not allowing them to change him telling people he has to gas in the car and meet someone up on [address]. Patient flopping around all night and not  being able to sleep. Patient not willing to cooperate with staff

## 2023-02-18 NOTE — Telephone Encounter (Signed)
Pt's wife [name] left message stating she has questions regarding the medications that were discussed on pt's last office visit.

## 2023-02-18 NOTE — Telephone Encounter (Signed)
Spoke to [place] and patient is being very manic. [name] said at the end of the last appointment Dr. Arbutus Leas had mentioned another drug that could be tried instead of the clonazepam.she believes it started with  a D but needing something very soon. Both her caregivers are out one with a bad back and one went to hospital for A-Fib [name] doesn't believe the Clonazepam is helping at all at this point

## 2023-02-19 NOTE — Telephone Encounter (Signed)
Called patients wife to let her know prescription has been sent in

## 2023-02-20 DIAGNOSIS — R41841 Cognitive communication deficit: Secondary | ICD-10-CM | POA: Diagnosis not present

## 2023-02-20 DIAGNOSIS — I69828 Other speech and language deficits following other cerebrovascular disease: Secondary | ICD-10-CM | POA: Diagnosis not present

## 2023-02-20 DIAGNOSIS — R499 Unspecified voice and resonance disorder: Secondary | ICD-10-CM | POA: Diagnosis not present

## 2023-02-22 DIAGNOSIS — M6281 Muscle weakness (generalized): Secondary | ICD-10-CM | POA: Diagnosis not present

## 2023-02-22 DIAGNOSIS — G20C Parkinsonism, unspecified: Secondary | ICD-10-CM | POA: Diagnosis not present

## 2023-02-22 DIAGNOSIS — R269 Unspecified abnormalities of gait and mobility: Secondary | ICD-10-CM | POA: Diagnosis not present

## 2023-02-24 DIAGNOSIS — G20C Parkinsonism, unspecified: Secondary | ICD-10-CM | POA: Diagnosis not present

## 2023-02-24 DIAGNOSIS — M6281 Muscle weakness (generalized): Secondary | ICD-10-CM | POA: Diagnosis not present

## 2023-02-24 DIAGNOSIS — R269 Unspecified abnormalities of gait and mobility: Secondary | ICD-10-CM | POA: Diagnosis not present

## 2023-02-25 DIAGNOSIS — R269 Unspecified abnormalities of gait and mobility: Secondary | ICD-10-CM | POA: Diagnosis not present

## 2023-02-25 DIAGNOSIS — G20C Parkinsonism, unspecified: Secondary | ICD-10-CM | POA: Diagnosis not present

## 2023-02-25 DIAGNOSIS — M6281 Muscle weakness (generalized): Secondary | ICD-10-CM | POA: Diagnosis not present

## 2023-02-26 DIAGNOSIS — R41841 Cognitive communication deficit: Secondary | ICD-10-CM | POA: Diagnosis not present

## 2023-02-26 DIAGNOSIS — I69828 Other speech and language deficits following other cerebrovascular disease: Secondary | ICD-10-CM | POA: Diagnosis not present

## 2023-02-26 DIAGNOSIS — R499 Unspecified voice and resonance disorder: Secondary | ICD-10-CM | POA: Diagnosis not present

## 2023-02-28 DIAGNOSIS — I69828 Other speech and language deficits following other cerebrovascular disease: Secondary | ICD-10-CM | POA: Diagnosis not present

## 2023-02-28 DIAGNOSIS — R41841 Cognitive communication deficit: Secondary | ICD-10-CM | POA: Diagnosis not present

## 2023-02-28 DIAGNOSIS — R499 Unspecified voice and resonance disorder: Secondary | ICD-10-CM | POA: Diagnosis not present

## 2023-03-05 DIAGNOSIS — I69828 Other speech and language deficits following other cerebrovascular disease: Secondary | ICD-10-CM | POA: Diagnosis not present

## 2023-03-05 DIAGNOSIS — R499 Unspecified voice and resonance disorder: Secondary | ICD-10-CM | POA: Diagnosis not present

## 2023-03-05 DIAGNOSIS — R41841 Cognitive communication deficit: Secondary | ICD-10-CM | POA: Diagnosis not present

## 2023-03-05 DIAGNOSIS — R269 Unspecified abnormalities of gait and mobility: Secondary | ICD-10-CM | POA: Diagnosis not present

## 2023-03-05 DIAGNOSIS — M6281 Muscle weakness (generalized): Secondary | ICD-10-CM | POA: Diagnosis not present

## 2023-03-05 DIAGNOSIS — G20C Parkinsonism, unspecified: Secondary | ICD-10-CM | POA: Diagnosis not present

## 2023-03-07 DIAGNOSIS — R499 Unspecified voice and resonance disorder: Secondary | ICD-10-CM | POA: Diagnosis not present

## 2023-03-07 DIAGNOSIS — R41841 Cognitive communication deficit: Secondary | ICD-10-CM | POA: Diagnosis not present

## 2023-03-07 DIAGNOSIS — M6281 Muscle weakness (generalized): Secondary | ICD-10-CM | POA: Diagnosis not present

## 2023-03-07 DIAGNOSIS — R269 Unspecified abnormalities of gait and mobility: Secondary | ICD-10-CM | POA: Diagnosis not present

## 2023-03-07 DIAGNOSIS — I69828 Other speech and language deficits following other cerebrovascular disease: Secondary | ICD-10-CM | POA: Diagnosis not present

## 2023-03-07 DIAGNOSIS — G20C Parkinsonism, unspecified: Secondary | ICD-10-CM | POA: Diagnosis not present

## 2023-03-12 DIAGNOSIS — I69828 Other speech and language deficits following other cerebrovascular disease: Secondary | ICD-10-CM | POA: Diagnosis not present

## 2023-03-12 DIAGNOSIS — M6281 Muscle weakness (generalized): Secondary | ICD-10-CM | POA: Diagnosis not present

## 2023-03-12 DIAGNOSIS — R499 Unspecified voice and resonance disorder: Secondary | ICD-10-CM | POA: Diagnosis not present

## 2023-03-12 DIAGNOSIS — R269 Unspecified abnormalities of gait and mobility: Secondary | ICD-10-CM | POA: Diagnosis not present

## 2023-03-12 DIAGNOSIS — G20C Parkinsonism, unspecified: Secondary | ICD-10-CM | POA: Diagnosis not present

## 2023-03-12 DIAGNOSIS — R41841 Cognitive communication deficit: Secondary | ICD-10-CM | POA: Diagnosis not present

## 2023-03-14 DIAGNOSIS — R41841 Cognitive communication deficit: Secondary | ICD-10-CM | POA: Diagnosis not present

## 2023-03-14 DIAGNOSIS — R499 Unspecified voice and resonance disorder: Secondary | ICD-10-CM | POA: Diagnosis not present

## 2023-03-14 DIAGNOSIS — I69828 Other speech and language deficits following other cerebrovascular disease: Secondary | ICD-10-CM | POA: Diagnosis not present

## 2023-03-15 DIAGNOSIS — M6281 Muscle weakness (generalized): Secondary | ICD-10-CM | POA: Diagnosis not present

## 2023-03-15 DIAGNOSIS — G20C Parkinsonism, unspecified: Secondary | ICD-10-CM | POA: Diagnosis not present

## 2023-03-15 DIAGNOSIS — R269 Unspecified abnormalities of gait and mobility: Secondary | ICD-10-CM | POA: Diagnosis not present

## 2023-03-18 DIAGNOSIS — R269 Unspecified abnormalities of gait and mobility: Secondary | ICD-10-CM | POA: Diagnosis not present

## 2023-03-18 DIAGNOSIS — M6281 Muscle weakness (generalized): Secondary | ICD-10-CM | POA: Diagnosis not present

## 2023-03-18 DIAGNOSIS — G20C Parkinsonism, unspecified: Secondary | ICD-10-CM | POA: Diagnosis not present

## 2023-03-19 DIAGNOSIS — I69828 Other speech and language deficits following other cerebrovascular disease: Secondary | ICD-10-CM | POA: Diagnosis not present

## 2023-03-19 DIAGNOSIS — R41841 Cognitive communication deficit: Secondary | ICD-10-CM | POA: Diagnosis not present

## 2023-03-19 DIAGNOSIS — R499 Unspecified voice and resonance disorder: Secondary | ICD-10-CM | POA: Diagnosis not present

## 2023-03-21 DIAGNOSIS — G20C Parkinsonism, unspecified: Secondary | ICD-10-CM | POA: Diagnosis not present

## 2023-03-21 DIAGNOSIS — R41841 Cognitive communication deficit: Secondary | ICD-10-CM | POA: Diagnosis not present

## 2023-03-21 DIAGNOSIS — I69828 Other speech and language deficits following other cerebrovascular disease: Secondary | ICD-10-CM | POA: Diagnosis not present

## 2023-03-21 DIAGNOSIS — R499 Unspecified voice and resonance disorder: Secondary | ICD-10-CM | POA: Diagnosis not present

## 2023-03-21 DIAGNOSIS — M6281 Muscle weakness (generalized): Secondary | ICD-10-CM | POA: Diagnosis not present

## 2023-03-21 DIAGNOSIS — R269 Unspecified abnormalities of gait and mobility: Secondary | ICD-10-CM | POA: Diagnosis not present

## 2023-03-26 DIAGNOSIS — G20C Parkinsonism, unspecified: Secondary | ICD-10-CM | POA: Diagnosis not present

## 2023-03-26 DIAGNOSIS — R269 Unspecified abnormalities of gait and mobility: Secondary | ICD-10-CM | POA: Diagnosis not present

## 2023-03-26 DIAGNOSIS — M6281 Muscle weakness (generalized): Secondary | ICD-10-CM | POA: Diagnosis not present

## 2023-03-28 DIAGNOSIS — R269 Unspecified abnormalities of gait and mobility: Secondary | ICD-10-CM | POA: Diagnosis not present

## 2023-03-28 DIAGNOSIS — G20C Parkinsonism, unspecified: Secondary | ICD-10-CM | POA: Diagnosis not present

## 2023-03-28 DIAGNOSIS — M6281 Muscle weakness (generalized): Secondary | ICD-10-CM | POA: Diagnosis not present

## 2023-04-01 DIAGNOSIS — M6281 Muscle weakness (generalized): Secondary | ICD-10-CM | POA: Diagnosis not present

## 2023-04-01 DIAGNOSIS — G20C Parkinsonism, unspecified: Secondary | ICD-10-CM | POA: Diagnosis not present

## 2023-04-01 DIAGNOSIS — R269 Unspecified abnormalities of gait and mobility: Secondary | ICD-10-CM | POA: Diagnosis not present

## 2023-04-02 DIAGNOSIS — R499 Unspecified voice and resonance disorder: Secondary | ICD-10-CM | POA: Diagnosis not present

## 2023-04-02 DIAGNOSIS — R41841 Cognitive communication deficit: Secondary | ICD-10-CM | POA: Diagnosis not present

## 2023-04-02 DIAGNOSIS — I69828 Other speech and language deficits following other cerebrovascular disease: Secondary | ICD-10-CM | POA: Diagnosis not present

## 2023-04-03 DIAGNOSIS — G20C Parkinsonism, unspecified: Secondary | ICD-10-CM | POA: Diagnosis not present

## 2023-04-03 DIAGNOSIS — R269 Unspecified abnormalities of gait and mobility: Secondary | ICD-10-CM | POA: Diagnosis not present

## 2023-04-03 DIAGNOSIS — M6281 Muscle weakness (generalized): Secondary | ICD-10-CM | POA: Diagnosis not present

## 2023-04-04 DIAGNOSIS — R41841 Cognitive communication deficit: Secondary | ICD-10-CM | POA: Diagnosis not present

## 2023-04-04 DIAGNOSIS — I69828 Other speech and language deficits following other cerebrovascular disease: Secondary | ICD-10-CM | POA: Diagnosis not present

## 2023-04-04 DIAGNOSIS — R499 Unspecified voice and resonance disorder: Secondary | ICD-10-CM | POA: Diagnosis not present

## 2023-04-09 DIAGNOSIS — R41841 Cognitive communication deficit: Secondary | ICD-10-CM | POA: Diagnosis not present

## 2023-04-09 DIAGNOSIS — I69828 Other speech and language deficits following other cerebrovascular disease: Secondary | ICD-10-CM | POA: Diagnosis not present

## 2023-04-09 DIAGNOSIS — R499 Unspecified voice and resonance disorder: Secondary | ICD-10-CM | POA: Diagnosis not present

## 2023-04-11 ENCOUNTER — Telehealth: Payer: Self-pay | Admitting: Neurology

## 2023-04-11 DIAGNOSIS — R269 Unspecified abnormalities of gait and mobility: Secondary | ICD-10-CM | POA: Diagnosis not present

## 2023-04-11 DIAGNOSIS — G20C Parkinsonism, unspecified: Secondary | ICD-10-CM | POA: Diagnosis not present

## 2023-04-11 DIAGNOSIS — M6281 Muscle weakness (generalized): Secondary | ICD-10-CM | POA: Diagnosis not present

## 2023-04-11 NOTE — Telephone Encounter (Signed)
Pt wife would like to speak to someone about patient recent behavior  please call

## 2023-04-12 NOTE — Telephone Encounter (Signed)
Patient is returning a call to Zachary [name] °

## 2023-04-12 NOTE — Telephone Encounter (Signed)
Called [name] and left a message for a call back.

## 2023-04-15 ENCOUNTER — Other Ambulatory Visit: Payer: Self-pay

## 2023-04-15 DIAGNOSIS — R4689 Other symptoms and signs involving appearance and behavior: Secondary | ICD-10-CM

## 2023-04-15 DIAGNOSIS — G20A2 Parkinson's disease without dyskinesia, with fluctuations: Secondary | ICD-10-CM

## 2023-04-15 DIAGNOSIS — R4182 Altered mental status, unspecified: Secondary | ICD-10-CM

## 2023-04-15 NOTE — Telephone Encounter (Signed)
Spoke to patients wife and patient around 4:00 pm starts becoming manic and jumping onto the floor doing a belly flop and rolling around. Patient also spitting his medication at staff and not allowing them to change him

## 2023-04-16 DIAGNOSIS — R41841 Cognitive communication deficit: Secondary | ICD-10-CM | POA: Diagnosis not present

## 2023-04-16 DIAGNOSIS — M6281 Muscle weakness (generalized): Secondary | ICD-10-CM | POA: Diagnosis not present

## 2023-04-16 DIAGNOSIS — I69828 Other speech and language deficits following other cerebrovascular disease: Secondary | ICD-10-CM | POA: Diagnosis not present

## 2023-04-16 DIAGNOSIS — R269 Unspecified abnormalities of gait and mobility: Secondary | ICD-10-CM | POA: Diagnosis not present

## 2023-04-16 DIAGNOSIS — G20C Parkinsonism, unspecified: Secondary | ICD-10-CM | POA: Diagnosis not present

## 2023-04-16 DIAGNOSIS — R499 Unspecified voice and resonance disorder: Secondary | ICD-10-CM | POA: Diagnosis not present

## 2023-04-18 DIAGNOSIS — R499 Unspecified voice and resonance disorder: Secondary | ICD-10-CM | POA: Diagnosis not present

## 2023-04-18 DIAGNOSIS — I69828 Other speech and language deficits following other cerebrovascular disease: Secondary | ICD-10-CM | POA: Diagnosis not present

## 2023-04-18 DIAGNOSIS — R41841 Cognitive communication deficit: Secondary | ICD-10-CM | POA: Diagnosis not present

## 2023-04-19 DIAGNOSIS — H04123 Dry eye syndrome of bilateral lacrimal glands: Secondary | ICD-10-CM | POA: Diagnosis not present

## 2023-04-19 DIAGNOSIS — Z961 Presence of intraocular lens: Secondary | ICD-10-CM | POA: Diagnosis not present

## 2023-04-19 DIAGNOSIS — H01001 Unspecified blepharitis right upper eyelid: Secondary | ICD-10-CM | POA: Diagnosis not present

## 2023-04-19 DIAGNOSIS — H52203 Unspecified astigmatism, bilateral: Secondary | ICD-10-CM | POA: Diagnosis not present

## 2023-04-22 ENCOUNTER — Other Ambulatory Visit: Payer: Self-pay

## 2023-04-22 DIAGNOSIS — G20A2 Parkinson's disease without dyskinesia, with fluctuations: Secondary | ICD-10-CM

## 2023-04-22 DIAGNOSIS — R4182 Altered mental status, unspecified: Secondary | ICD-10-CM

## 2023-04-22 DIAGNOSIS — R4689 Other symptoms and signs involving appearance and behavior: Secondary | ICD-10-CM

## 2023-04-22 MED ORDER — DIVALPROEX SODIUM 250 MG PO DR TAB: DELAYED_RELEASE_TABLET | ORAL | 0 refills | Status: DC

## 2023-04-22 NOTE — Telephone Encounter (Signed)
Patients wife has reached out to PCP in the past patient has been prescribed clonazepam 1/2 tablet to help calm him he is jumping on the bed and trying to jump face first off and working himself up till he is red in the face. She is asking if it is ok for her to continue to give him this medication. I asked patients wife to reach put to PCP Dr. Leandro Reasoner office to see if he would continue to prescribe and be ok with him taking it daily. Patients wife said she is desperate for something to calm him in the late afternoon and evings she said Depakote works in the morning but wears off around 3-4

## 2023-04-22 NOTE — Telephone Encounter (Signed)
Sent prescription in and called patients wife

## 2023-04-22 NOTE — Telephone Encounter (Signed)
Pt is calling back to let us know that she has not heard anything from the psychologist and stated that she was told to call the office back to get the process speeded up.

## 2023-04-22 NOTE — Telephone Encounter (Signed)
[facility] and they did receive the referral but they are looking at the end of August beginning of Sept to get a new patient in. I know patients wife will not be happy with this time period. [facility] at [facility] [address] [place],  [place]  [address] Get Driving Directions Main: [phone]

## 2023-04-23 DIAGNOSIS — R41841 Cognitive communication deficit: Secondary | ICD-10-CM | POA: Diagnosis not present

## 2023-04-23 DIAGNOSIS — I69828 Other speech and language deficits following other cerebrovascular disease: Secondary | ICD-10-CM | POA: Diagnosis not present

## 2023-04-23 DIAGNOSIS — R499 Unspecified voice and resonance disorder: Secondary | ICD-10-CM | POA: Diagnosis not present

## 2023-04-25 DIAGNOSIS — I69828 Other speech and language deficits following other cerebrovascular disease: Secondary | ICD-10-CM | POA: Diagnosis not present

## 2023-04-25 DIAGNOSIS — R499 Unspecified voice and resonance disorder: Secondary | ICD-10-CM | POA: Diagnosis not present

## 2023-04-25 DIAGNOSIS — R41841 Cognitive communication deficit: Secondary | ICD-10-CM | POA: Diagnosis not present

## 2023-04-29 ENCOUNTER — Telehealth: Payer: Self-pay | Admitting: Neurology

## 2023-04-29 DIAGNOSIS — I951 Orthostatic hypotension: Secondary | ICD-10-CM | POA: Diagnosis not present

## 2023-04-29 DIAGNOSIS — G3184 Mild cognitive impairment, so stated: Secondary | ICD-10-CM | POA: Diagnosis not present

## 2023-04-29 DIAGNOSIS — G20A1 Parkinson's disease without dyskinesia, without mention of fluctuations: Secondary | ICD-10-CM | POA: Diagnosis not present

## 2023-04-29 NOTE — Telephone Encounter (Signed)
Caller states her husband takes depakote 250mg  tabs. Last night he was given an extra dose total of 500mg . She states that today he isnt able to stand or sit even with assistance. He is confused, he will not swallow his fluids, just holding them in his mouth which isnt normal. The wife gave him a pill and the med tech gave him one as well.

## 2023-04-29 NOTE — Telephone Encounter (Signed)
Called patients wife and they accidentally overdosed patient by giving him the 2 250 mg in the morning but in the evening they gave him 500 and then a 250 so patient was very out of it. They are on the right track now and will be giving him 500 in the and 250 in the Pm

## 2023-04-30 DIAGNOSIS — R499 Unspecified voice and resonance disorder: Secondary | ICD-10-CM | POA: Diagnosis not present

## 2023-04-30 DIAGNOSIS — I69828 Other speech and language deficits following other cerebrovascular disease: Secondary | ICD-10-CM | POA: Diagnosis not present

## 2023-04-30 DIAGNOSIS — R41841 Cognitive communication deficit: Secondary | ICD-10-CM | POA: Diagnosis not present

## 2023-04-30 DIAGNOSIS — R1311 Dysphagia, oral phase: Secondary | ICD-10-CM | POA: Diagnosis not present

## 2023-05-02 DIAGNOSIS — I69828 Other speech and language deficits following other cerebrovascular disease: Secondary | ICD-10-CM | POA: Diagnosis not present

## 2023-05-02 DIAGNOSIS — R41841 Cognitive communication deficit: Secondary | ICD-10-CM | POA: Diagnosis not present

## 2023-05-02 DIAGNOSIS — R1311 Dysphagia, oral phase: Secondary | ICD-10-CM | POA: Diagnosis not present

## 2023-05-02 DIAGNOSIS — R499 Unspecified voice and resonance disorder: Secondary | ICD-10-CM | POA: Diagnosis not present

## 2023-05-06 DIAGNOSIS — G20A1 Parkinson's disease without dyskinesia, without mention of fluctuations: Secondary | ICD-10-CM | POA: Diagnosis not present

## 2023-05-07 DIAGNOSIS — R1311 Dysphagia, oral phase: Secondary | ICD-10-CM | POA: Diagnosis not present

## 2023-05-07 DIAGNOSIS — I69828 Other speech and language deficits following other cerebrovascular disease: Secondary | ICD-10-CM | POA: Diagnosis not present

## 2023-05-07 DIAGNOSIS — R41841 Cognitive communication deficit: Secondary | ICD-10-CM | POA: Diagnosis not present

## 2023-05-07 DIAGNOSIS — R499 Unspecified voice and resonance disorder: Secondary | ICD-10-CM | POA: Diagnosis not present

## 2023-05-14 DIAGNOSIS — R41841 Cognitive communication deficit: Secondary | ICD-10-CM | POA: Diagnosis not present

## 2023-05-14 DIAGNOSIS — R499 Unspecified voice and resonance disorder: Secondary | ICD-10-CM | POA: Diagnosis not present

## 2023-05-14 DIAGNOSIS — R1311 Dysphagia, oral phase: Secondary | ICD-10-CM | POA: Diagnosis not present

## 2023-05-14 DIAGNOSIS — I69828 Other speech and language deficits following other cerebrovascular disease: Secondary | ICD-10-CM | POA: Diagnosis not present

## 2023-05-16 DIAGNOSIS — R1311 Dysphagia, oral phase: Secondary | ICD-10-CM | POA: Diagnosis not present

## 2023-05-16 DIAGNOSIS — I69828 Other speech and language deficits following other cerebrovascular disease: Secondary | ICD-10-CM | POA: Diagnosis not present

## 2023-05-16 DIAGNOSIS — R41841 Cognitive communication deficit: Secondary | ICD-10-CM | POA: Diagnosis not present

## 2023-05-16 DIAGNOSIS — R499 Unspecified voice and resonance disorder: Secondary | ICD-10-CM | POA: Diagnosis not present

## 2023-05-20 ENCOUNTER — Other Ambulatory Visit: Payer: Self-pay

## 2023-05-21 ENCOUNTER — Telehealth: Payer: Self-pay | Admitting: Neurology

## 2023-05-21 ENCOUNTER — Other Ambulatory Visit: Payer: Self-pay

## 2023-05-21 DIAGNOSIS — I69828 Other speech and language deficits following other cerebrovascular disease: Secondary | ICD-10-CM | POA: Diagnosis not present

## 2023-05-21 DIAGNOSIS — R499 Unspecified voice and resonance disorder: Secondary | ICD-10-CM | POA: Diagnosis not present

## 2023-05-21 DIAGNOSIS — R1311 Dysphagia, oral phase: Secondary | ICD-10-CM | POA: Diagnosis not present

## 2023-05-21 DIAGNOSIS — F0280 Dementia in other diseases classified elsewhere without behavioral disturbance: Secondary | ICD-10-CM

## 2023-05-21 DIAGNOSIS — R4182 Altered mental status, unspecified: Secondary | ICD-10-CM

## 2023-05-21 DIAGNOSIS — R41841 Cognitive communication deficit: Secondary | ICD-10-CM | POA: Diagnosis not present

## 2023-05-21 DIAGNOSIS — R4689 Other symptoms and signs involving appearance and behavior: Secondary | ICD-10-CM

## 2023-05-21 NOTE — Telephone Encounter (Signed)
Patient's wife is requesting a call back please/KB

## 2023-05-21 NOTE — Telephone Encounter (Signed)
Called Megan referral coordinator at [phone] and left voicemail that we needed this pateint to be seen ASAP please

## 2023-05-21 NOTE — Telephone Encounter (Signed)
Called patients wife back. She stated that the 2 Depakote in the morning are working great. The 1 in the evening isn't working as well and she would like to go up to 2 in the evening as well. She would like to mention he is still acting out and having problems at night. The facility is giving him his Depakote at 5:30 and it is too late she would like an order for it to be moved up to 2-3:30. I did tell patients wife about the referral to psychiatry and that I am resending the referral to day

## 2023-05-23 DIAGNOSIS — R1311 Dysphagia, oral phase: Secondary | ICD-10-CM | POA: Diagnosis not present

## 2023-05-23 DIAGNOSIS — I69828 Other speech and language deficits following other cerebrovascular disease: Secondary | ICD-10-CM | POA: Diagnosis not present

## 2023-05-23 DIAGNOSIS — R41841 Cognitive communication deficit: Secondary | ICD-10-CM | POA: Diagnosis not present

## 2023-05-23 DIAGNOSIS — R499 Unspecified voice and resonance disorder: Secondary | ICD-10-CM | POA: Diagnosis not present

## 2023-05-23 NOTE — Telephone Encounter (Signed)
Patient's wife is requesting a call back from nurse. Zachary [name]

## 2023-05-23 NOTE — Telephone Encounter (Signed)
Patients wife feels he needs more medication at night and less during the day if Dr. Arbutus Leas would agree to the patient taking 1 pill morning and 2 pills around 3 patients wife feels it will cover his mood better

## 2023-05-24 NOTE — Telephone Encounter (Signed)
Patients wife told me that she has not changed the amount of pills he has changed the times of the pills. She said he has had the best day he has had in awhile. Scheduled them virtual video

## 2023-05-27 ENCOUNTER — Other Ambulatory Visit: Payer: Self-pay

## 2023-05-27 DIAGNOSIS — F32A Depression, unspecified: Secondary | ICD-10-CM

## 2023-05-28 DIAGNOSIS — R41841 Cognitive communication deficit: Secondary | ICD-10-CM | POA: Diagnosis not present

## 2023-05-28 DIAGNOSIS — R499 Unspecified voice and resonance disorder: Secondary | ICD-10-CM | POA: Diagnosis not present

## 2023-05-28 DIAGNOSIS — R1311 Dysphagia, oral phase: Secondary | ICD-10-CM | POA: Diagnosis not present

## 2023-05-28 DIAGNOSIS — I69828 Other speech and language deficits following other cerebrovascular disease: Secondary | ICD-10-CM | POA: Diagnosis not present

## 2023-05-29 ENCOUNTER — Other Ambulatory Visit: Payer: Self-pay

## 2023-05-29 NOTE — Progress Notes (Signed)
Assessment/Plan:   1.  Parkinsons Disease  -take Carbidopa/levodopa 25/100, 3 tablets at 6:30 AM/2 at 8:30 AM/2 at 10:30 AM/2 at 12:30 PM/2 at 2:30 PM/2 at 4:30pm/2 at 6:30pm.  Can take up to extra tablet (in 1/2 tabs) per day prn  -Patient apparently recently has been giving them trouble about taking his medication and they asked about crushing it.  I told the patient's wife that while they can crush it or drop it in ginger ale, it will not last as long that way.  Instead, I would recommend putting it in ice cream and using that as a vehicle to disguise it, but not necessarily crushing it within the ice cream.  They can chop it in half and I do not think he will notice it is in there.  -Continue carbidopa/levodopa 50/200 at bed.  -Patient had CT brain done that suggested ventricles slightly out of proportion to atrophy.  They have opted to not do lumbar puncture.  -Prescription for lift chair given.  -Wife asked about physical therapy, stating that home physical therapy was cut off about a month ago.  I suspect that he ran out of Medicare visits or was not progressing.  She is going to check on those things.  I do not have an objection to him continuing with physical therapy, if he is able to from a Medicare standpoint.  She thinks he has slid backwards since stopping.  2.    Memory change/PDD             -Patient had neurocognitive testing with Dr. Milbert Coulter on Mar 21, 2022 and demonstrated PDD.  Patient actually did fairly well on the testing, but because of some troubles with ADLs and high level of education, they actually classified it with mild PDD  -Patient has been on Depakote, which has helped the aggressive behavior, but continues to have some.  Unfortunately, the field of psychiatry is a bit out of my room.  We have sent the referral to various psychiatrists, which to this point have been declined.  I am still working on this.  In the meantime, they think that the Depakote has helped, but  instead of taking 2 in the morning and 1 in the afternoon, they have been spreading it out to 1 tablet 3 times per day.  I really do not have an objection to that.  They asked me to change it to the sprinkles, however, as he has been chewing his medicine lately.   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 Ladona Ridgel.  Has had multiple episodes of syncope and 1 episode of probable convulsive syncope.  He had hypokalemia with high doses of Florinef.  It is unclear what dose of Florinef is right now.  Cardiology notes state that it is 0.3 mg twice per day and medication list states 0.2 mg twice per day.  They did not bring a medicine list with them today.  -I think his midodrine has plenty of room to go up.  -They have not made Dr. Ladona Ridgel aware that patient continues to have syncopal episodes, primarily in the morning when he first gets up or if he wakes up early to use the restroom.  I told them they needed to follow-up, but I will also send a message.    Subjective:   Zachary [name] was seen today in follow up for Parkinsons disease.  My previous  records were reviewed prior to todays visit as well as outside records available to me.  Patient seen with wife who supplements history.  We have received numerous calls since our last visit, mostly regarding behavior.  We have tried to send the patient to numerous psychiatry locations and those referrals have been declined.  We started the patient on Depakote and have been working our way up on that.  He is prescribed 500 mg in the morning and 250 in the evening.  His wife is wanting to increase that further and they present today to discuss.  Wife states that she started giving it to him 1 po tid instead of 2 in the AM and 1 in the evening.  That has seemed to help.  Right now they are at a good place, a "much better place than they were."  There are full time caregivers with him.  They don't have the ability to do  labs where he lives.  Wife mentions hallucinations but she isn't sure how often those are as most of the time its the caregivers who are with him (and they are not present today).  They ask about PT.      Current prescribed movement disorder medications: Carbidopa/levodopa 25/100, 3 tablets at 6:30 AM/2 at 8:30 AM/2 at 10:30 AM/2 at 12:30 PM/2 at 2:30 PM/2 at 4:30pm/2 at 6:30pm.   Carbidopa/levodopa 50/200 at bedtime Midodrine, 5 mg 3 times per day  Florinef 0.3 mg twice per day. Depakote, 250 mg, 2 tablets in the morning and 1 in the evening   PREVIOUS MEDICATIONS: amantadine; Azilect; Inbrija; carbidopa/levodopa 25/100; carbidopa/levodopa 50/200; Stalevo; clonazepam (was only on 0.25 mg at bedtime and they discontinued as pharmacist told them it was addictive); entacapone  ALLERGIES:   Allergies  Allergen Reactions   Pear Other (See Comments)    Unknown Not listed on MAR   Tree Extract Other (See Comments)    Unknown Not listed on MAR    CURRENT MEDICATIONS:  Outpatient Encounter Medications as of 05/31/2023  Medication Sig   AMBULATORY NON FORMULARY MEDICATION Transport chair Dx: Parkinsons Disease G20   AMBULATORY NON FORMULARY MEDICATION DISCONTINUE ENTACAPONE   carbidopa-levodopa (SINEMET CR) 50-200 MG tablet TAKE ONE TABLET BY MOUTH AT BEDTIME   carbidopa-levodopa (SINEMET IR) 25-100 MG tablet 3 tabs AT 6:30AM/2 at 8:30AM/2 at 10:30AM/2 at 12:30PM/2 at 2:30PM/2 at 4:30pm/2 at 6:30pm. take up to extra tablet (in 1/2 tabs) per day AS NEEDED   Cholecalciferol (DIALYVITE VITAMIN D 5000 PO) Take 1 tablet by mouth daily.   divalproex (DEPAKOTE) 250 MG DR tablet Take 2 tablets in the Am and 1 tablet in the afternoon around 4   finasteride (PROSCAR) 5 MG tablet Take 5 mg by mouth daily.   fludrocortisone (FLORINEF) 0.1 MG tablet Take 1 tablet (0.1 mg total) by mouth 2 (two) times daily.   Lactobacillus (PROBIOTIC ACIDOPHILUS PO) Take 1 tablet by mouth daily.   midodrine (PROAMATINE)  5 MG tablet TAKE (2) TABLETS BY MOUTH TWICE DAILY AT 8AM & 12PM. TAKE (1) TABLET BY MOUTH AT DINNER.   Multiple Vitamins-Minerals (PRESERVISION AREDS 2 PO) Take 1 tablet by mouth daily.   mupirocin ointment (BACTROBAN) 2 % Apply 1 Application topically 2 (two) times daily.   potassium chloride SA (KLOR-CON M) 20 MEQ tablet Take 2 tablets (40 mEq total) by mouth 2 (two) times daily for 3 days.   No facility-administered encounter medications on file as of 05/31/2023.    Objective:   PHYSICAL EXAMINATION:  VITALS:   There were no vitals filed for this visit.  GEN:  The patient appears stated age and is in NAD. HEENT:  Normocephalic, atraumatic.    Neurological examination:  Orientation: The patient is alert and oriented x3.   Cranial nerves: There is good facial symmety with facial hypomimia. The speech is fluent and clear. Soft palate rises symmetrically and there is no tongue deviation. Hearing is intact to conversational tone. Motor: Strength is at least antigravity x4.  Movement examination: Tone: There is normal tone today in the bilateral upper extremities. Abnormal movements: none seen today Coordination:  There is good RAMs in the UE today Gait and Station: unable per pt/wife as they didn't have the walker near them  I have reviewed and interpreted the following labs independently    Chemistry      Component Value Date/Time   NA 141 06/05/2022 0728   NA 142 07/18/2021 1044   K 3.9 06/05/2022 0728   CL 105 06/05/2022 0728   CO2 29 06/05/2022 0728   BUN 11 06/05/2022 0728   BUN 17 07/18/2021 1044   CREATININE 0.83 06/05/2022 0728      Component Value Date/Time   CALCIUM 8.6 (L) 06/05/2022 0728   ALKPHOS 59 06/03/2022 0933   AST 14 (L) 06/03/2022 0933   ALT <5 06/03/2022 0933   BILITOT 0.4 06/03/2022 0933       Lab Results  Component Value Date   WBC 6.8 06/05/2022   HGB 13.2 06/05/2022   HCT 40.1 06/05/2022   MCV 97.1 06/05/2022   PLT 302 06/05/2022     No results found for: "TSH"   Follow up Instructions      -I discussed the assessment and treatment plan with the patient. The patient was provided an opportunity to ask questions and all were answered. The patient agreed with the plan and demonstrated an understanding of the instructions.   The patient was advised to call back or seek an in-person evaluation if the symptoms worsen or if the condition fails to improve as anticipated.    Total time spent on today's visit was 43 minutes, including both face-to-face time and nonface-to-face time.  Time included that spent on review of records (prior notes available to me/labs/imaging if pertinent), discussing treatment and goals, answering patient's questions and coordinating care.   [name], DO

## 2023-05-30 DIAGNOSIS — R41841 Cognitive communication deficit: Secondary | ICD-10-CM | POA: Diagnosis not present

## 2023-05-30 DIAGNOSIS — R1311 Dysphagia, oral phase: Secondary | ICD-10-CM | POA: Diagnosis not present

## 2023-05-30 DIAGNOSIS — R499 Unspecified voice and resonance disorder: Secondary | ICD-10-CM | POA: Diagnosis not present

## 2023-05-30 DIAGNOSIS — I69828 Other speech and language deficits following other cerebrovascular disease: Secondary | ICD-10-CM | POA: Diagnosis not present

## 2023-05-31 ENCOUNTER — Other Ambulatory Visit: Payer: Self-pay

## 2023-05-31 ENCOUNTER — Telehealth: Payer: Medicare PPO | Admitting: Neurology

## 2023-05-31 ENCOUNTER — Encounter: Payer: Self-pay | Admitting: Neurology

## 2023-05-31 DIAGNOSIS — G20A2 Parkinson's disease without dyskinesia, with fluctuations: Secondary | ICD-10-CM

## 2023-05-31 DIAGNOSIS — R413 Other amnesia: Secondary | ICD-10-CM | POA: Diagnosis not present

## 2023-05-31 DIAGNOSIS — G20A1 Parkinson's disease without dyskinesia, without mention of fluctuations: Secondary | ICD-10-CM | POA: Diagnosis not present

## 2023-05-31 DIAGNOSIS — R4689 Other symptoms and signs involving appearance and behavior: Secondary | ICD-10-CM

## 2023-05-31 MED ORDER — DIVALPROEX SODIUM 125 MG PO CSDR: 250.0000 mg | DELAYED_RELEASE_CAPSULE | Freq: Three times a day (TID) | ORAL | 1 refills | Status: DC

## 2023-05-31 NOTE — Patient Instructions (Signed)
We discussed a nonprofit medical company called [employer].  They have all kinds of medical equipment including wheelchairs, walkers, canes and incontinence materials, which is free of charge.  Their phone number is [phone].  Their email is [email or web address].  Feel free to reach out to them with questions.  I believe that they are only open for pick up on Tuesdays but you can contact them other days of the week.

## 2023-06-03 ENCOUNTER — Telehealth: Payer: Self-pay | Admitting: Neurology

## 2023-06-03 NOTE — Telephone Encounter (Signed)
Patient's wife is requesting a call back,

## 2023-06-04 NOTE — Telephone Encounter (Signed)
Patients wife asking for advice on a private insurance

## 2023-06-05 DIAGNOSIS — R499 Unspecified voice and resonance disorder: Secondary | ICD-10-CM | POA: Diagnosis not present

## 2023-06-05 DIAGNOSIS — I69828 Other speech and language deficits following other cerebrovascular disease: Secondary | ICD-10-CM | POA: Diagnosis not present

## 2023-06-05 DIAGNOSIS — R41841 Cognitive communication deficit: Secondary | ICD-10-CM | POA: Diagnosis not present

## 2023-06-05 DIAGNOSIS — R1311 Dysphagia, oral phase: Secondary | ICD-10-CM | POA: Diagnosis not present

## 2023-06-06 ENCOUNTER — Other Ambulatory Visit: Payer: Self-pay

## 2023-06-06 DIAGNOSIS — R499 Unspecified voice and resonance disorder: Secondary | ICD-10-CM | POA: Diagnosis not present

## 2023-06-06 DIAGNOSIS — R41841 Cognitive communication deficit: Secondary | ICD-10-CM | POA: Diagnosis not present

## 2023-06-06 DIAGNOSIS — R1311 Dysphagia, oral phase: Secondary | ICD-10-CM | POA: Diagnosis not present

## 2023-06-06 DIAGNOSIS — G20A1 Parkinson's disease without dyskinesia, without mention of fluctuations: Secondary | ICD-10-CM | POA: Diagnosis not present

## 2023-06-06 DIAGNOSIS — F32A Depression, unspecified: Secondary | ICD-10-CM

## 2023-06-06 DIAGNOSIS — I69828 Other speech and language deficits following other cerebrovascular disease: Secondary | ICD-10-CM | POA: Diagnosis not present

## 2023-06-11 DIAGNOSIS — R499 Unspecified voice and resonance disorder: Secondary | ICD-10-CM | POA: Diagnosis not present

## 2023-06-11 DIAGNOSIS — R41841 Cognitive communication deficit: Secondary | ICD-10-CM | POA: Diagnosis not present

## 2023-06-11 DIAGNOSIS — I69828 Other speech and language deficits following other cerebrovascular disease: Secondary | ICD-10-CM | POA: Diagnosis not present

## 2023-06-11 DIAGNOSIS — R1311 Dysphagia, oral phase: Secondary | ICD-10-CM | POA: Diagnosis not present

## 2023-06-18 DIAGNOSIS — I69828 Other speech and language deficits following other cerebrovascular disease: Secondary | ICD-10-CM | POA: Diagnosis not present

## 2023-06-18 DIAGNOSIS — R1311 Dysphagia, oral phase: Secondary | ICD-10-CM | POA: Diagnosis not present

## 2023-06-18 DIAGNOSIS — R499 Unspecified voice and resonance disorder: Secondary | ICD-10-CM | POA: Diagnosis not present

## 2023-06-18 DIAGNOSIS — R41841 Cognitive communication deficit: Secondary | ICD-10-CM | POA: Diagnosis not present

## 2023-06-20 DIAGNOSIS — R1311 Dysphagia, oral phase: Secondary | ICD-10-CM | POA: Diagnosis not present

## 2023-06-20 DIAGNOSIS — I69828 Other speech and language deficits following other cerebrovascular disease: Secondary | ICD-10-CM | POA: Diagnosis not present

## 2023-06-20 DIAGNOSIS — R499 Unspecified voice and resonance disorder: Secondary | ICD-10-CM | POA: Diagnosis not present

## 2023-06-20 DIAGNOSIS — R41841 Cognitive communication deficit: Secondary | ICD-10-CM | POA: Diagnosis not present

## 2023-06-25 DIAGNOSIS — G20B2 Parkinson's disease with dyskinesia, with fluctuations: Secondary | ICD-10-CM | POA: Diagnosis not present

## 2023-06-25 DIAGNOSIS — F1914 Other psychoactive substance abuse with psychoactive substance-induced mood disorder: Secondary | ICD-10-CM | POA: Diagnosis not present

## 2023-06-26 DIAGNOSIS — Z85828 Personal history of other malignant neoplasm of skin: Secondary | ICD-10-CM | POA: Diagnosis not present

## 2023-06-26 DIAGNOSIS — L814 Other melanin hyperpigmentation: Secondary | ICD-10-CM | POA: Diagnosis not present

## 2023-06-26 DIAGNOSIS — L821 Other seborrheic keratosis: Secondary | ICD-10-CM | POA: Diagnosis not present

## 2023-06-26 DIAGNOSIS — L570 Actinic keratosis: Secondary | ICD-10-CM | POA: Diagnosis not present

## 2023-06-26 DIAGNOSIS — D1801 Hemangioma of skin and subcutaneous tissue: Secondary | ICD-10-CM | POA: Diagnosis not present

## 2023-07-03 DIAGNOSIS — R2681 Unsteadiness on feet: Secondary | ICD-10-CM | POA: Diagnosis not present

## 2023-07-03 DIAGNOSIS — M6281 Muscle weakness (generalized): Secondary | ICD-10-CM | POA: Diagnosis not present

## 2023-07-03 DIAGNOSIS — R279 Unspecified lack of coordination: Secondary | ICD-10-CM | POA: Diagnosis not present

## 2023-07-06 DIAGNOSIS — R269 Unspecified abnormalities of gait and mobility: Secondary | ICD-10-CM | POA: Diagnosis not present

## 2023-07-06 DIAGNOSIS — M6281 Muscle weakness (generalized): Secondary | ICD-10-CM | POA: Diagnosis not present

## 2023-07-07 DIAGNOSIS — G20A1 Parkinson's disease without dyskinesia, without mention of fluctuations: Secondary | ICD-10-CM | POA: Diagnosis not present

## 2023-07-07 DIAGNOSIS — R2681 Unsteadiness on feet: Secondary | ICD-10-CM | POA: Diagnosis not present

## 2023-07-07 DIAGNOSIS — M6281 Muscle weakness (generalized): Secondary | ICD-10-CM | POA: Diagnosis not present

## 2023-07-07 DIAGNOSIS — R279 Unspecified lack of coordination: Secondary | ICD-10-CM | POA: Diagnosis not present

## 2023-07-09 DIAGNOSIS — Z79899 Other long term (current) drug therapy: Secondary | ICD-10-CM | POA: Diagnosis not present

## 2023-07-09 DIAGNOSIS — R2681 Unsteadiness on feet: Secondary | ICD-10-CM | POA: Diagnosis not present

## 2023-07-09 DIAGNOSIS — E785 Hyperlipidemia, unspecified: Secondary | ICD-10-CM | POA: Diagnosis not present

## 2023-07-09 DIAGNOSIS — R351 Nocturia: Secondary | ICD-10-CM | POA: Diagnosis not present

## 2023-07-09 DIAGNOSIS — R7989 Other specified abnormal findings of blood chemistry: Secondary | ICD-10-CM | POA: Diagnosis not present

## 2023-07-09 DIAGNOSIS — M6281 Muscle weakness (generalized): Secondary | ICD-10-CM | POA: Diagnosis not present

## 2023-07-09 DIAGNOSIS — N3941 Urge incontinence: Secondary | ICD-10-CM | POA: Diagnosis not present

## 2023-07-09 DIAGNOSIS — R279 Unspecified lack of coordination: Secondary | ICD-10-CM | POA: Diagnosis not present

## 2023-07-09 DIAGNOSIS — Z0000 Encounter for general adult medical examination without abnormal findings: Secondary | ICD-10-CM | POA: Diagnosis not present

## 2023-07-10 DIAGNOSIS — M6281 Muscle weakness (generalized): Secondary | ICD-10-CM | POA: Diagnosis not present

## 2023-07-10 DIAGNOSIS — R269 Unspecified abnormalities of gait and mobility: Secondary | ICD-10-CM | POA: Diagnosis not present

## 2023-07-11 DIAGNOSIS — R269 Unspecified abnormalities of gait and mobility: Secondary | ICD-10-CM | POA: Diagnosis not present

## 2023-07-11 DIAGNOSIS — M6281 Muscle weakness (generalized): Secondary | ICD-10-CM | POA: Diagnosis not present

## 2023-07-12 DIAGNOSIS — G20B2 Parkinson's disease with dyskinesia, with fluctuations: Secondary | ICD-10-CM | POA: Diagnosis not present

## 2023-07-12 DIAGNOSIS — F1914 Other psychoactive substance abuse with psychoactive substance-induced mood disorder: Secondary | ICD-10-CM | POA: Diagnosis not present

## 2023-07-14 DIAGNOSIS — M6281 Muscle weakness (generalized): Secondary | ICD-10-CM | POA: Diagnosis not present

## 2023-07-14 DIAGNOSIS — R269 Unspecified abnormalities of gait and mobility: Secondary | ICD-10-CM | POA: Diagnosis not present

## 2023-07-14 DIAGNOSIS — R2681 Unsteadiness on feet: Secondary | ICD-10-CM | POA: Diagnosis not present

## 2023-07-14 DIAGNOSIS — R279 Unspecified lack of coordination: Secondary | ICD-10-CM | POA: Diagnosis not present

## 2023-07-16 DIAGNOSIS — Z1331 Encounter for screening for depression: Secondary | ICD-10-CM | POA: Diagnosis not present

## 2023-07-16 DIAGNOSIS — G20B1 Parkinson's disease with dyskinesia, without mention of fluctuations: Secondary | ICD-10-CM | POA: Diagnosis not present

## 2023-07-16 DIAGNOSIS — Z7189 Other specified counseling: Secondary | ICD-10-CM | POA: Diagnosis not present

## 2023-07-16 DIAGNOSIS — F02A18 Dementia in other diseases classified elsewhere, mild, with other behavioral disturbance: Secondary | ICD-10-CM | POA: Diagnosis not present

## 2023-07-16 DIAGNOSIS — R351 Nocturia: Secondary | ICD-10-CM | POA: Diagnosis not present

## 2023-07-16 DIAGNOSIS — Z1339 Encounter for screening examination for other mental health and behavioral disorders: Secondary | ICD-10-CM | POA: Diagnosis not present

## 2023-07-16 DIAGNOSIS — I951 Orthostatic hypotension: Secondary | ICD-10-CM | POA: Diagnosis not present

## 2023-07-16 DIAGNOSIS — N3941 Urge incontinence: Secondary | ICD-10-CM | POA: Diagnosis not present

## 2023-07-16 DIAGNOSIS — Z0000 Encounter for general adult medical examination without abnormal findings: Secondary | ICD-10-CM | POA: Diagnosis not present

## 2023-07-16 DIAGNOSIS — Z23 Encounter for immunization: Secondary | ICD-10-CM | POA: Diagnosis not present

## 2023-07-16 DIAGNOSIS — R55 Syncope and collapse: Secondary | ICD-10-CM | POA: Diagnosis not present

## 2023-07-19 ENCOUNTER — Other Ambulatory Visit: Payer: Self-pay | Admitting: Neurology

## 2023-07-19 DIAGNOSIS — R4689 Other symptoms and signs involving appearance and behavior: Secondary | ICD-10-CM

## 2023-07-19 DIAGNOSIS — R4182 Altered mental status, unspecified: Secondary | ICD-10-CM

## 2023-07-19 DIAGNOSIS — G20A2 Parkinson's disease without dyskinesia, with fluctuations: Secondary | ICD-10-CM

## 2023-07-19 MED ORDER — DIVALPROEX SODIUM 125 MG PO CSDR: 250.0000 mg | DELAYED_RELEASE_CAPSULE | Freq: Three times a day (TID) | ORAL | 0 refills | Status: DC

## 2023-07-20 IMAGING — CT CT HEAD W/O CM
2 series · 14 of 30 positions shown, 16 images · non-contrast
Comparison: None.

CLINICAL DATA: Altered mental status, frequent falls, hearing loss
common blurry vision

EXAM:
CT HEAD WITHOUT CONTRAST
TECHNIQUE: Contiguous axial images were obtained from the base of the skull
through the vertex without intravenous contrast.

[Series 2: head w/(date) · axial · 0.45mm/px · z∈[-259,-149]mm · 6 of 32 slices shown, 8 images]
[im 5/32  brain]
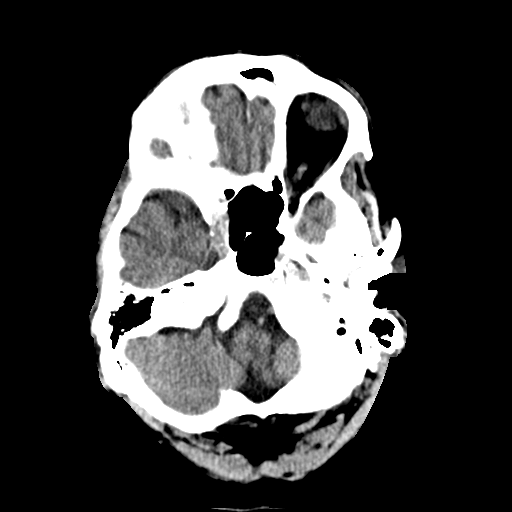
[im 5/32  bone]
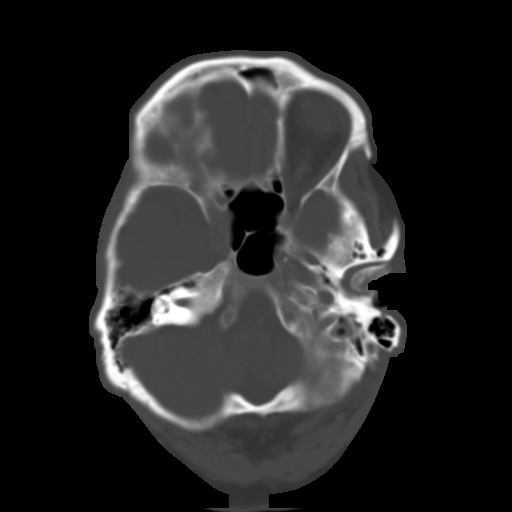
[im 9/32  brain]
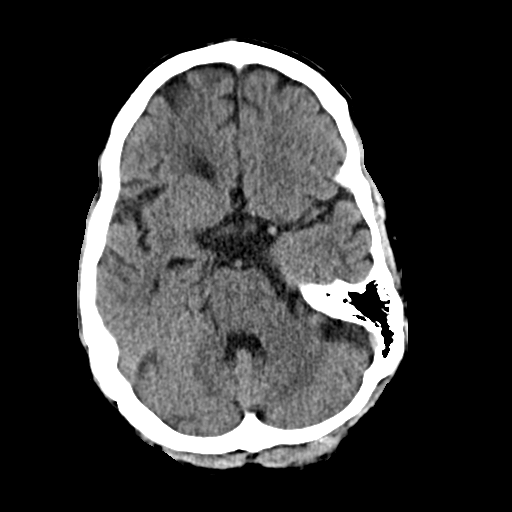
[im 14/32  brain]
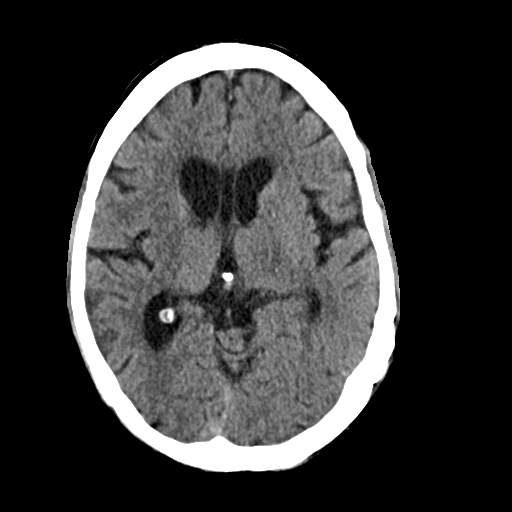
[im 18/32  brain]
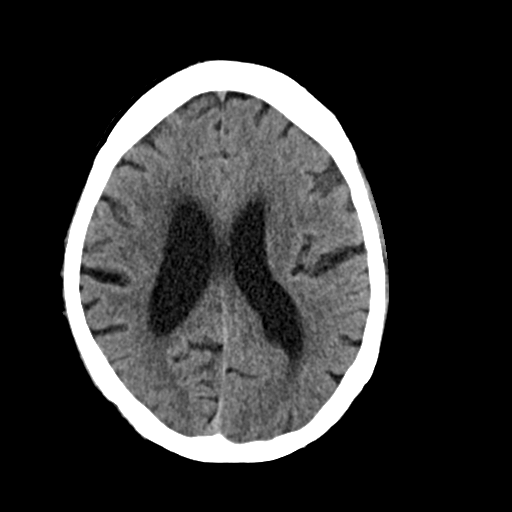
[im 23/32  brain]
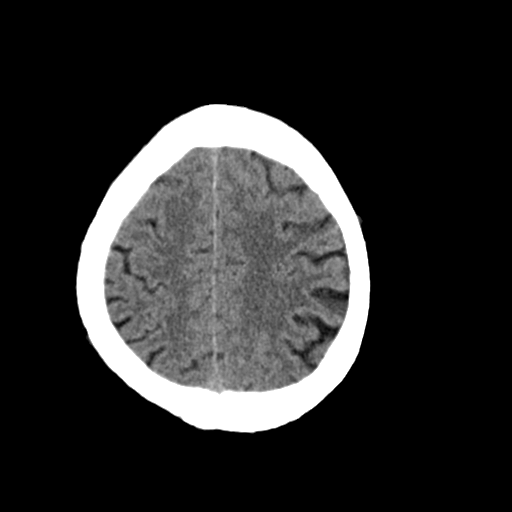
[im 23/32  bone]
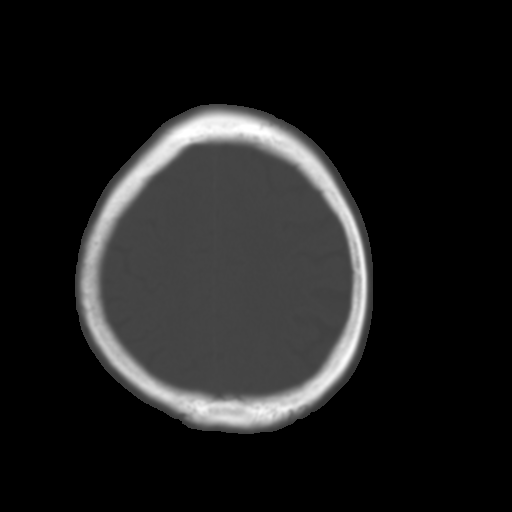
[im 27/32  brain]
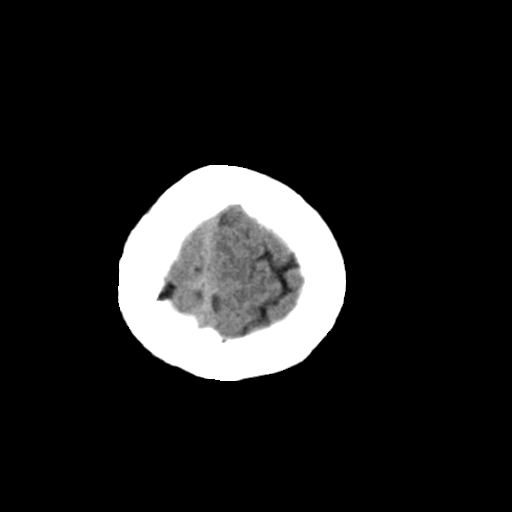

[Series 3: bone · axial · 0.35mm/px · z∈[-282,-148]mm · 8 of 84 slices shown]
[im 8/84  bone]
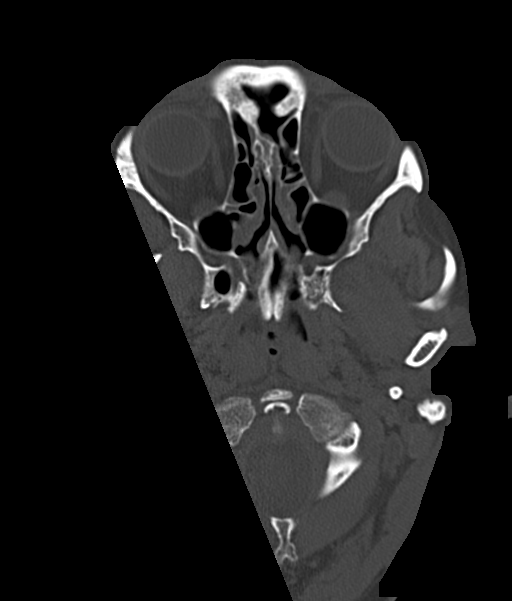
[im 16/84  bone]
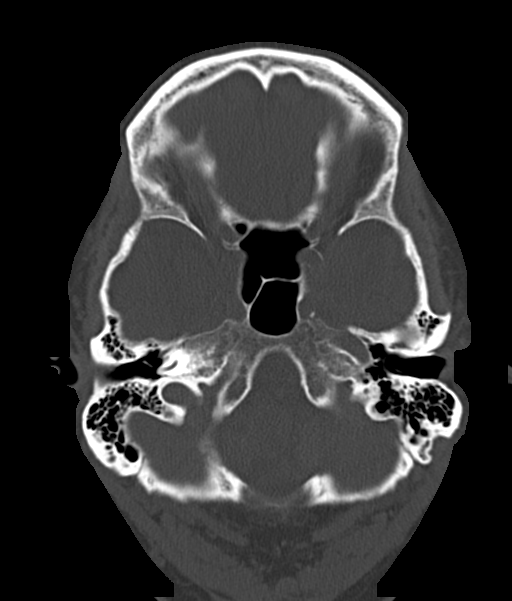
[im 28/84  bone]
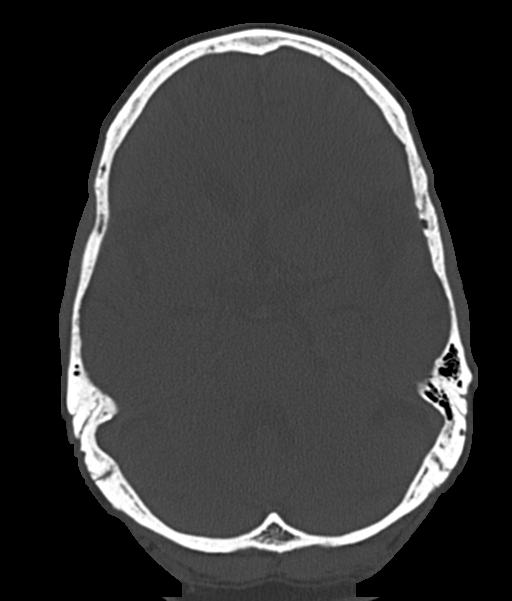
[im 36/84  bone]
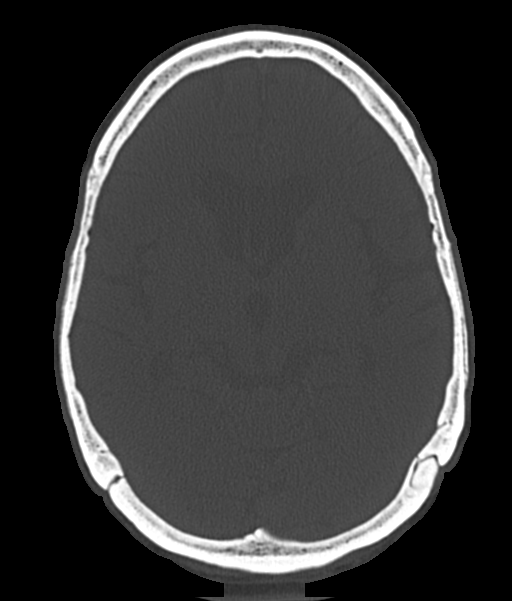
[im 48/84  bone]
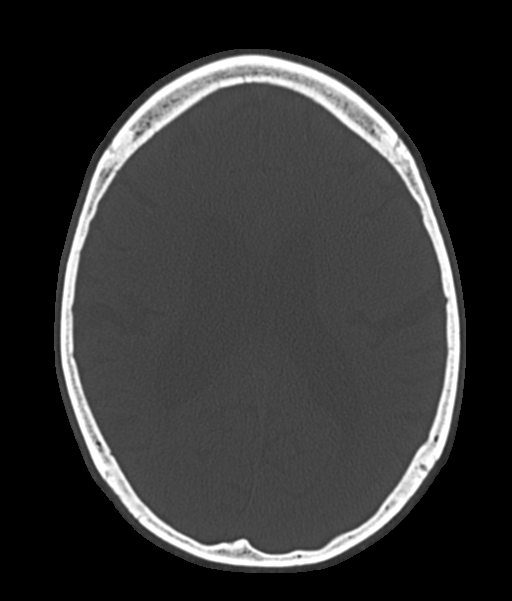
[im 56/84  bone]
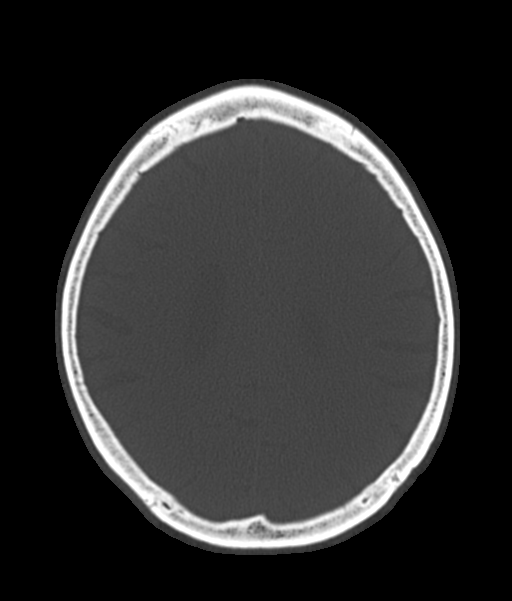
[im 68/84  bone]
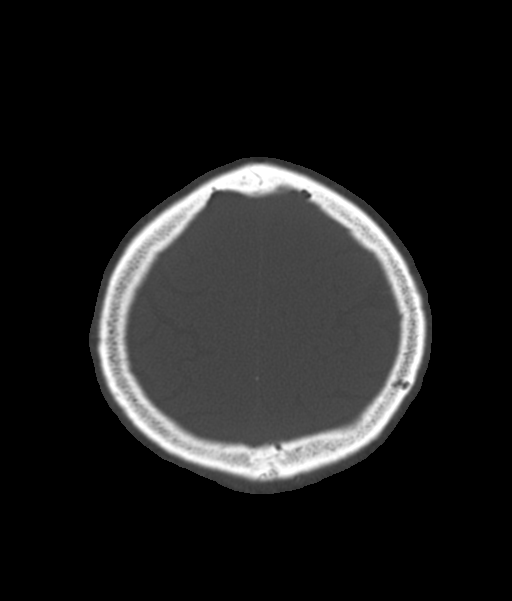
[im 76/84  bone]
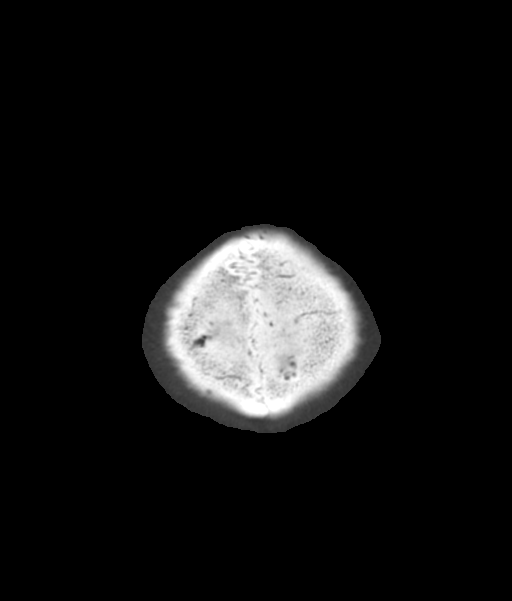

[14 of 30 positions shown; findings below may reference images not displayed]

FINDINGS: Brain: There is no evidence of acute intracranial hemorrhage,
extra-axial fluid collection, or acute infarct.

There is mild global parenchymal volume loss. The lateral ventricles
are increased in size out of proportion to the degree of volume loss
with crowding of the gyri at the vertex. Incidental note is made of
a cavum septum pellucidum et vergae.

Patchy hypodensity in the subcortical and periventricular white
matter likely reflects sequela of chronic white matter
microangiopathy. There is no solid mass lesion. There is no midline
shift.

Vascular: There is calcification of the bilateral cavernous ICAs.

Skull: Normal. Negative for fracture or focal lesion.

Sinuses/Orbits: There is mild mucosal thickening in the imaged
paranasal sinuses. Bilateral lens implants are noted. The imaged
globes and orbits are otherwise unremarkable.

Other: None.
IMPRESSION: 1. Mildly dilated lateral ventricles which appears out of proportion
to the degree of parenchymal volume loss may reflect central greater
than peripheral volume loss common but normal pressure hydrocephalus
should be considered.
2. Otherwise, no acute intracranial pathology.

## 2023-07-23 ENCOUNTER — Other Ambulatory Visit: Payer: Self-pay

## 2023-07-23 ENCOUNTER — Telehealth: Payer: Self-pay | Admitting: Neurology

## 2023-07-23 DIAGNOSIS — F1914 Other psychoactive substance abuse with psychoactive substance-induced mood disorder: Secondary | ICD-10-CM | POA: Diagnosis not present

## 2023-07-23 DIAGNOSIS — G20B2 Parkinson's disease with dyskinesia, with fluctuations: Secondary | ICD-10-CM | POA: Diagnosis not present

## 2023-07-23 DIAGNOSIS — F32A Depression, unspecified: Secondary | ICD-10-CM

## 2023-07-23 MED ORDER — DIVALPROEX SODIUM 125 MG PO CSDR
DELAYED_RELEASE_CAPSULE | ORAL | 0 refills | Status: DC
Start: 2023-07-23 — End: 2023-11-25

## 2023-07-23 NOTE — Telephone Encounter (Signed)
Patient called with mediation changes , she wants to cancel Depakote and wanting to let Dr. Arbutus Leas to know the patient is seeing a Psychiatrist

## 2023-07-23 NOTE — Telephone Encounter (Signed)
Psychiatrist put patient on clonazepam TID and it completely knocked patient out. Psychiatrist did not know patient was still on the Depakote and when patients wife asked for a D/C order the psychiatrist refused and said that Dr. Arbutus Leas will have to do that since she prescribed the Depakote. They have refused the medication so patient has already stopped taking it cold [place] and is having some really bad days. Patients wife feels he is severely over medicated and will not even open his eyes last week

## 2023-07-23 NOTE — Telephone Encounter (Signed)
Who is his psychiatrist?  Is it a physician or NP?  Under pdmp, the only prescriber I see for klonopin is dr. Clelia Croft and that was a while back.  Its not the depakote causing him this sleeping since he's been on it for a while without trouble.  Hopefully, they d/c the klonopin?  You can stop the depakote IF he's not been taking it anyway

## 2023-07-23 NOTE — Telephone Encounter (Signed)
He has been going to a private office because psyc wouldn't take him here. He is going to [facility]. They have reduced the klonopin to half

## 2023-07-24 NOTE — Telephone Encounter (Signed)
Patient is seeing Dr. Tonna Corner at [facility] the D/C order

## 2023-07-27 DIAGNOSIS — R269 Unspecified abnormalities of gait and mobility: Secondary | ICD-10-CM | POA: Diagnosis not present

## 2023-07-27 DIAGNOSIS — M6281 Muscle weakness (generalized): Secondary | ICD-10-CM | POA: Diagnosis not present

## 2023-07-28 DIAGNOSIS — R2681 Unsteadiness on feet: Secondary | ICD-10-CM | POA: Diagnosis not present

## 2023-07-28 DIAGNOSIS — M6281 Muscle weakness (generalized): Secondary | ICD-10-CM | POA: Diagnosis not present

## 2023-07-28 DIAGNOSIS — R279 Unspecified lack of coordination: Secondary | ICD-10-CM | POA: Diagnosis not present

## 2023-07-31 DIAGNOSIS — R2681 Unsteadiness on feet: Secondary | ICD-10-CM | POA: Diagnosis not present

## 2023-07-31 DIAGNOSIS — R279 Unspecified lack of coordination: Secondary | ICD-10-CM | POA: Diagnosis not present

## 2023-07-31 DIAGNOSIS — M6281 Muscle weakness (generalized): Secondary | ICD-10-CM | POA: Diagnosis not present

## 2023-08-03 DIAGNOSIS — M6281 Muscle weakness (generalized): Secondary | ICD-10-CM | POA: Diagnosis not present

## 2023-08-03 DIAGNOSIS — R269 Unspecified abnormalities of gait and mobility: Secondary | ICD-10-CM | POA: Diagnosis not present

## 2023-08-06 DIAGNOSIS — M6281 Muscle weakness (generalized): Secondary | ICD-10-CM | POA: Diagnosis not present

## 2023-08-06 DIAGNOSIS — R269 Unspecified abnormalities of gait and mobility: Secondary | ICD-10-CM | POA: Diagnosis not present

## 2023-08-06 DIAGNOSIS — G20A1 Parkinson's disease without dyskinesia, without mention of fluctuations: Secondary | ICD-10-CM | POA: Diagnosis not present

## 2023-08-06 NOTE — Progress Notes (Unsigned)
Assessment/Plan:    1.  Parkinsons Disease             -take Carbidopa/levodopa 25/100, 3 tablets at 6:30 AM/2 at 8:30 AM/2 at 10:30 AM/2 at 12:30 PM/2 at 2:30 PM/2 at 4:30pm/2 at 6:30pm.  Can take up to extra tablet (in 1/2 tabs) per day prn             -Patient apparently recently has been giving them trouble about taking his medication and they asked about crushing it.  I told the patient's wife that while they can crush it or drop it in ginger ale, it will not last as long that way.  Instead, I would recommend putting it in ice cream and using that as a vehicle to disguise it, but not necessarily crushing it within the ice cream.  They can chop it in half and I do not think he will notice it is in there.             -Continue carbidopa/levodopa 50/200 at bed.             -Patient had CT brain done that suggested ventricles slightly out of proportion to atrophy.  They have opted to not do lumbar puncture.   2.    Memory change/PDD             -Patient had neurocognitive testing with Dr. Milbert Coulter on Mar 21, 2022 and demonstrated PDD.  Patient actually did fairly well on the testing, but because of some troubles with ADLs and high level of education, they actually classified it with mild PDD.  Certain way, things have likely progressed since that time.   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 Ladona Ridgel.  Has had multiple episodes of syncope and 1 episode of probable convulsive syncope.  He had hypokalemia with high doses of Florinef.  His Florinef dose appears to have gone down to 0.1 mg daily.  This is significantly reduced from in the past.               -He is on midodrine 5 mg 3 times per day (now being prescribed by primary care)             -They have not made Dr. Ladona Ridgel aware that patient continues to have syncopal episodes, primarily in the morning when he first gets up or if he wakes up early to use the restroom.  I told them they needed to  follow-up, but I will also send a message.  4.  Behavioral change  -Patient under the care of psychiatry.  Subjective:   Zachary [name] was seen today in follow up for Parkinsons disease.  My previous records were reviewed prior to todays visit as well as outside records available to me.  Patient seen with wife who supplements history.  I saw him on video about 8 weeks ago.  At that point in time he was doing fairly well on Depakote, 250 mg 3 times per day.  Since then, he has been to psychiatry and his wife called back and asked me to discontinue the Depakote.  His wife stated that the psychiatrist asked me to do that because she had placed him on 3 times per day clonazepam and it completely "knocked the patient out" (not surprisingly) and the wife was told it was because she did not know the patient was on Depakote.  I  stopped the Depakote, as requested, but told the family that the Depakote was not what was causing him to be sleepy, since he was never sleepy before on it, but that he was sleepy because of the 3 times per day clonazepam.  Interestingly, pharmacy records indicate a prescription for Depakote from psychiatry only 10 days after I had given him Depakote (making it unclear why they wanted me to send the stop order to his facility).  His PDMP is reviewed and only shows #60 clonazepam from September 13.  Unfortunately, psychiatry records are not available.  Patient still has full-time caregivers 24 hours/day.  Patient has not had any syncopal episodes.  Patient's Florinef dose has been greatly reduced.  He used to be on 0.3 mg twice per day by cardiology.  Pharmacy records indicate that he is now on 0.1 mg daily.  It is being prescribed by primary care now.    Current prescribed movement disorder medications: Carbidopa/levodopa 25/100, 3 tablets at 6:30 AM/2 at 8:30 AM/2 at 10:30 AM/2 at 12:30 PM/2 at 2:30 PM/2 at 4:30pm/2 at 6:30pm.   Carbidopa/levodopa 50/200 at bedtime Midodrine, 5 mg 3  times per day (not being prescribed by primary care) Florinef 0.1 mg daily  PREVIOUS MEDICATIONS: amantadine; Azilect; Inbrija; carbidopa/levodopa 25/100; carbidopa/levodopa 50/200; Stalevo; clonazepam (was only on 0.25 mg at bedtime and they discontinued as pharmacist told them it was addictive); entacapone; Depakote, did well with this  ALLERGIES:   Allergies  Allergen Reactions   Pear Other (See Comments)    Unknown Not listed on MAR   Tree Extract Other (See Comments)    Unknown Not listed on MAR    CURRENT MEDICATIONS:  Outpatient Encounter Medications as of 08/08/2023  Medication Sig   AMBULATORY NON FORMULARY MEDICATION Transport chair Dx: Parkinsons Disease G20   AMBULATORY NON FORMULARY MEDICATION DISCONTINUE ENTACAPONE   carbidopa-levodopa (SINEMET CR) 50-200 MG tablet TAKE ONE TABLET BY MOUTH AT BEDTIME   carbidopa-levodopa (SINEMET IR) 25-100 MG tablet 3 tabs AT 6:30AM/2 at 8:30AM/2 at 10:30AM/2 at 12:30PM/2 at 2:30PM/2 at 4:30pm/2 at 6:30pm. take up to extra tablet (in 1/2 tabs) per day AS NEEDED   Cholecalciferol (DIALYVITE VITAMIN D 5000 PO) Take 1 tablet by mouth daily.   divalproex (DEPAKOTE SPRINKLES) 125 MG capsule DISCONTINUE THIS MEDICATION   finasteride (PROSCAR) 5 MG tablet Take 5 mg by mouth daily.   fludrocortisone (FLORINEF) 0.1 MG tablet Take 1 tablet (0.1 mg total) by mouth 2 (two) times daily.   Lactobacillus (PROBIOTIC ACIDOPHILUS PO) Take 1 tablet by mouth daily.   midodrine (PROAMATINE) 5 MG tablet TAKE (2) TABLETS BY MOUTH TWICE DAILY AT 8AM & 12PM. TAKE (1) TABLET BY MOUTH AT DINNER.   Multiple Vitamins-Minerals (PRESERVISION AREDS 2 PO) Take 1 tablet by mouth daily.   mupirocin ointment (BACTROBAN) 2 % Apply 1 Application topically 2 (two) times daily.   potassium chloride SA (KLOR-CON M) 20 MEQ tablet Take 2 tablets (40 mEq total) by mouth 2 (two) times daily for 3 days.   No facility-administered encounter medications on file as of 08/08/2023.     Objective:   PHYSICAL EXAMINATION:    VITALS:   There were no vitals filed for this visit.    GEN:  The patient appears stated age and is in NAD. HEENT:  Normocephalic, atraumatic.  The mucous membranes are moist. The superficial temporal arteries are without ropiness or tenderness. CV:  RRR Lungs:  CTAB Neck/HEME:  There are no carotid bruits bilaterally.  Neurological examination:  Orientation: The patient is alert and oriented x3.  He looks to wife for finer aspects of the history.  There is intermittent confusion.  This is really stable from previous visit. Cranial nerves: There is good facial symmety with facial hypomimia. The speech is fluent and clear. Soft palate rises symmetrically and there is no tongue deviation. Hearing is intact to conversational tone. Sensation: Sensation is intact to light touch throughout Motor: Strength is at least antigravity x4.  Movement examination: Tone: There is normal tone today in the bilateral upper extremities. Abnormal movements: rare LUE>RUE rest tremor Coordination:  There is minimal decremation with rapid alternating movements today.  He has a little bit of slowness in his feet, left greater than right. Gait and Station: Pushes off the wheelchair to arise.  He is given a walker.  He actually walks fairly well with a walker, although he is a bit unsteady.  I have reviewed and interpreted the following labs independently    Chemistry      Component Value Date/Time   NA 141 06/05/2022 0728   NA 142 07/18/2021 1044   K 3.9 06/05/2022 0728   CL 105 06/05/2022 0728   CO2 29 06/05/2022 0728   BUN 11 06/05/2022 0728   BUN 17 07/18/2021 1044   CREATININE 0.83 06/05/2022 0728      Component Value Date/Time   CALCIUM 8.6 (L) 06/05/2022 0728   ALKPHOS 59 06/03/2022 0933   AST 14 (L) 06/03/2022 0933   ALT <5 06/03/2022 0933   BILITOT 0.4 06/03/2022 0933       Lab Results  Component Value Date   WBC 6.8 06/05/2022   HGB  13.2 06/05/2022   HCT 40.1 06/05/2022   MCV 97.1 06/05/2022   PLT 302 06/05/2022    No results found for: "TSH"    Cc:  [name], MD  Total time spent on today's visit was *** minutes, including both face-to-face time and nonface-to-face time.  Time included that spent on review of records (prior notes available to me/labs/imaging if pertinent), discussing treatment and goals, answering patient's questions and coordinating care.

## 2023-08-07 DIAGNOSIS — R279 Unspecified lack of coordination: Secondary | ICD-10-CM | POA: Diagnosis not present

## 2023-08-07 DIAGNOSIS — R2681 Unsteadiness on feet: Secondary | ICD-10-CM | POA: Diagnosis not present

## 2023-08-07 DIAGNOSIS — M6281 Muscle weakness (generalized): Secondary | ICD-10-CM | POA: Diagnosis not present

## 2023-08-08 ENCOUNTER — Ambulatory Visit: Payer: Medicare PPO | Admitting: Neurology

## 2023-08-08 ENCOUNTER — Encounter: Payer: Self-pay | Admitting: Neurology

## 2023-08-08 VITALS — BP 100/60 | HR 50 | Ht 69.0 in | Wt 166.0 lb

## 2023-08-08 DIAGNOSIS — G20B1 Parkinson's disease with dyskinesia, without mention of fluctuations: Secondary | ICD-10-CM | POA: Diagnosis not present

## 2023-08-08 DIAGNOSIS — G20A1 Parkinson's disease without dyskinesia, without mention of fluctuations: Secondary | ICD-10-CM | POA: Diagnosis not present

## 2023-08-08 DIAGNOSIS — F0280 Dementia in other diseases classified elsewhere without behavioral disturbance: Secondary | ICD-10-CM | POA: Diagnosis not present

## 2023-08-08 DIAGNOSIS — G903 Multi-system degeneration of the autonomic nervous system: Secondary | ICD-10-CM

## 2023-08-10 DIAGNOSIS — M6281 Muscle weakness (generalized): Secondary | ICD-10-CM | POA: Diagnosis not present

## 2023-08-10 DIAGNOSIS — R269 Unspecified abnormalities of gait and mobility: Secondary | ICD-10-CM | POA: Diagnosis not present

## 2023-08-11 DIAGNOSIS — R2681 Unsteadiness on feet: Secondary | ICD-10-CM | POA: Diagnosis not present

## 2023-08-11 DIAGNOSIS — R279 Unspecified lack of coordination: Secondary | ICD-10-CM | POA: Diagnosis not present

## 2023-08-11 DIAGNOSIS — M6281 Muscle weakness (generalized): Secondary | ICD-10-CM | POA: Diagnosis not present

## 2023-08-12 DIAGNOSIS — Z515 Encounter for palliative care: Secondary | ICD-10-CM | POA: Diagnosis not present

## 2023-08-12 DIAGNOSIS — I10 Essential (primary) hypertension: Secondary | ICD-10-CM | POA: Diagnosis not present

## 2023-08-12 DIAGNOSIS — K629 Disease of anus and rectum, unspecified: Secondary | ICD-10-CM | POA: Diagnosis not present

## 2023-08-12 DIAGNOSIS — M25551 Pain in right hip: Secondary | ICD-10-CM | POA: Diagnosis not present

## 2023-08-12 DIAGNOSIS — R1310 Dysphagia, unspecified: Secondary | ICD-10-CM | POA: Diagnosis not present

## 2023-08-13 DIAGNOSIS — N3941 Urge incontinence: Secondary | ICD-10-CM | POA: Diagnosis not present

## 2023-08-15 DIAGNOSIS — M6281 Muscle weakness (generalized): Secondary | ICD-10-CM | POA: Diagnosis not present

## 2023-08-15 DIAGNOSIS — R269 Unspecified abnormalities of gait and mobility: Secondary | ICD-10-CM | POA: Diagnosis not present

## 2023-08-17 DIAGNOSIS — M6281 Muscle weakness (generalized): Secondary | ICD-10-CM | POA: Diagnosis not present

## 2023-08-17 DIAGNOSIS — R269 Unspecified abnormalities of gait and mobility: Secondary | ICD-10-CM | POA: Diagnosis not present

## 2023-08-18 DIAGNOSIS — R2681 Unsteadiness on feet: Secondary | ICD-10-CM | POA: Diagnosis not present

## 2023-08-18 DIAGNOSIS — M6281 Muscle weakness (generalized): Secondary | ICD-10-CM | POA: Diagnosis not present

## 2023-08-18 DIAGNOSIS — R279 Unspecified lack of coordination: Secondary | ICD-10-CM | POA: Diagnosis not present

## 2023-08-21 DIAGNOSIS — R2681 Unsteadiness on feet: Secondary | ICD-10-CM | POA: Diagnosis not present

## 2023-08-21 DIAGNOSIS — M6281 Muscle weakness (generalized): Secondary | ICD-10-CM | POA: Diagnosis not present

## 2023-08-21 DIAGNOSIS — R279 Unspecified lack of coordination: Secondary | ICD-10-CM | POA: Diagnosis not present

## 2023-08-22 ENCOUNTER — Telehealth: Payer: Self-pay | Admitting: Neurology

## 2023-08-22 NOTE — Telephone Encounter (Signed)
Patients wife called and wanted to leave a message for you to call her back

## 2023-08-22 NOTE — Telephone Encounter (Signed)
Called patients wife and patients BP was 180/120 and they do not have an order to check this patients B/P and would need [place] to send an order for them to check his B/P . They did not take patient to hospital as EMS said there are a lot of really sick people at the hospital and patient would need to sit there for hours before being seen exposing him to these sicknesses. I told patients wife to call me back and let me know what staff says about checking his B/P

## 2023-08-22 NOTE — Telephone Encounter (Signed)
Left message with the after hours service on 08-22-23 at 12:47 pm    Caller states that the patient is currently at [facility] and they are in the process of taking him to the [facility] due to his increase of medication

## 2023-08-23 DIAGNOSIS — Z515 Encounter for palliative care: Secondary | ICD-10-CM | POA: Diagnosis not present

## 2023-08-23 DIAGNOSIS — K629 Disease of anus and rectum, unspecified: Secondary | ICD-10-CM | POA: Diagnosis not present

## 2023-08-23 DIAGNOSIS — I10 Essential (primary) hypertension: Secondary | ICD-10-CM | POA: Diagnosis not present

## 2023-08-23 DIAGNOSIS — M25551 Pain in right hip: Secondary | ICD-10-CM | POA: Diagnosis not present

## 2023-08-23 DIAGNOSIS — R1310 Dysphagia, unspecified: Secondary | ICD-10-CM | POA: Diagnosis not present

## 2023-08-25 DIAGNOSIS — R279 Unspecified lack of coordination: Secondary | ICD-10-CM | POA: Diagnosis not present

## 2023-08-25 DIAGNOSIS — R2681 Unsteadiness on feet: Secondary | ICD-10-CM | POA: Diagnosis not present

## 2023-08-25 DIAGNOSIS — M6281 Muscle weakness (generalized): Secondary | ICD-10-CM | POA: Diagnosis not present

## 2023-08-26 DIAGNOSIS — G20B1 Parkinson's disease with dyskinesia, without mention of fluctuations: Secondary | ICD-10-CM | POA: Diagnosis not present

## 2023-08-26 DIAGNOSIS — K5909 Other constipation: Secondary | ICD-10-CM | POA: Diagnosis not present

## 2023-08-26 DIAGNOSIS — F02A18 Dementia in other diseases classified elsewhere, mild, with other behavioral disturbance: Secondary | ICD-10-CM | POA: Diagnosis not present

## 2023-08-26 DIAGNOSIS — I951 Orthostatic hypotension: Secondary | ICD-10-CM | POA: Diagnosis not present

## 2023-08-26 DIAGNOSIS — N3941 Urge incontinence: Secondary | ICD-10-CM | POA: Diagnosis not present

## 2023-08-27 DIAGNOSIS — N390 Urinary tract infection, site not specified: Secondary | ICD-10-CM | POA: Diagnosis not present

## 2023-08-28 DIAGNOSIS — M6281 Muscle weakness (generalized): Secondary | ICD-10-CM | POA: Diagnosis not present

## 2023-08-28 DIAGNOSIS — R269 Unspecified abnormalities of gait and mobility: Secondary | ICD-10-CM | POA: Diagnosis not present

## 2023-08-29 DIAGNOSIS — R269 Unspecified abnormalities of gait and mobility: Secondary | ICD-10-CM | POA: Diagnosis not present

## 2023-08-29 DIAGNOSIS — M6281 Muscle weakness (generalized): Secondary | ICD-10-CM | POA: Diagnosis not present

## 2023-08-30 DIAGNOSIS — I69891 Dysphagia following other cerebrovascular disease: Secondary | ICD-10-CM | POA: Diagnosis not present

## 2023-08-30 DIAGNOSIS — I69828 Other speech and language deficits following other cerebrovascular disease: Secondary | ICD-10-CM | POA: Diagnosis not present

## 2023-08-30 DIAGNOSIS — R1312 Dysphagia, oropharyngeal phase: Secondary | ICD-10-CM | POA: Diagnosis not present

## 2023-08-30 DIAGNOSIS — R41841 Cognitive communication deficit: Secondary | ICD-10-CM | POA: Diagnosis not present

## 2023-09-01 DIAGNOSIS — M6281 Muscle weakness (generalized): Secondary | ICD-10-CM | POA: Diagnosis not present

## 2023-09-01 DIAGNOSIS — R279 Unspecified lack of coordination: Secondary | ICD-10-CM | POA: Diagnosis not present

## 2023-09-01 DIAGNOSIS — R2681 Unsteadiness on feet: Secondary | ICD-10-CM | POA: Diagnosis not present

## 2023-09-02 DIAGNOSIS — R41841 Cognitive communication deficit: Secondary | ICD-10-CM | POA: Diagnosis not present

## 2023-09-02 DIAGNOSIS — I69891 Dysphagia following other cerebrovascular disease: Secondary | ICD-10-CM | POA: Diagnosis not present

## 2023-09-02 DIAGNOSIS — F1914 Other psychoactive substance abuse with psychoactive substance-induced mood disorder: Secondary | ICD-10-CM | POA: Diagnosis not present

## 2023-09-02 DIAGNOSIS — R1312 Dysphagia, oropharyngeal phase: Secondary | ICD-10-CM | POA: Diagnosis not present

## 2023-09-02 DIAGNOSIS — G20B2 Parkinson's disease with dyskinesia, with fluctuations: Secondary | ICD-10-CM | POA: Diagnosis not present

## 2023-09-02 DIAGNOSIS — I69828 Other speech and language deficits following other cerebrovascular disease: Secondary | ICD-10-CM | POA: Diagnosis not present

## 2023-09-03 DIAGNOSIS — B351 Tinea unguium: Secondary | ICD-10-CM | POA: Diagnosis not present

## 2023-09-03 DIAGNOSIS — M2011 Hallux valgus (acquired), right foot: Secondary | ICD-10-CM | POA: Diagnosis not present

## 2023-09-06 DIAGNOSIS — R41841 Cognitive communication deficit: Secondary | ICD-10-CM | POA: Diagnosis not present

## 2023-09-06 DIAGNOSIS — I69891 Dysphagia following other cerebrovascular disease: Secondary | ICD-10-CM | POA: Diagnosis not present

## 2023-09-06 DIAGNOSIS — I69828 Other speech and language deficits following other cerebrovascular disease: Secondary | ICD-10-CM | POA: Diagnosis not present

## 2023-09-06 DIAGNOSIS — R1312 Dysphagia, oropharyngeal phase: Secondary | ICD-10-CM | POA: Diagnosis not present

## 2023-09-06 DIAGNOSIS — G20A1 Parkinson's disease without dyskinesia, without mention of fluctuations: Secondary | ICD-10-CM | POA: Diagnosis not present

## 2023-09-08 DIAGNOSIS — R2681 Unsteadiness on feet: Secondary | ICD-10-CM | POA: Diagnosis not present

## 2023-09-08 DIAGNOSIS — M6281 Muscle weakness (generalized): Secondary | ICD-10-CM | POA: Diagnosis not present

## 2023-09-08 DIAGNOSIS — R269 Unspecified abnormalities of gait and mobility: Secondary | ICD-10-CM | POA: Diagnosis not present

## 2023-09-08 DIAGNOSIS — R279 Unspecified lack of coordination: Secondary | ICD-10-CM | POA: Diagnosis not present

## 2023-09-09 DIAGNOSIS — R41841 Cognitive communication deficit: Secondary | ICD-10-CM | POA: Diagnosis not present

## 2023-09-09 DIAGNOSIS — R1312 Dysphagia, oropharyngeal phase: Secondary | ICD-10-CM | POA: Diagnosis not present

## 2023-09-09 DIAGNOSIS — I69828 Other speech and language deficits following other cerebrovascular disease: Secondary | ICD-10-CM | POA: Diagnosis not present

## 2023-09-09 DIAGNOSIS — I69891 Dysphagia following other cerebrovascular disease: Secondary | ICD-10-CM | POA: Diagnosis not present

## 2023-09-11 DIAGNOSIS — R41841 Cognitive communication deficit: Secondary | ICD-10-CM | POA: Diagnosis not present

## 2023-09-11 DIAGNOSIS — R2681 Unsteadiness on feet: Secondary | ICD-10-CM | POA: Diagnosis not present

## 2023-09-11 DIAGNOSIS — I69828 Other speech and language deficits following other cerebrovascular disease: Secondary | ICD-10-CM | POA: Diagnosis not present

## 2023-09-11 DIAGNOSIS — M6281 Muscle weakness (generalized): Secondary | ICD-10-CM | POA: Diagnosis not present

## 2023-09-11 DIAGNOSIS — R279 Unspecified lack of coordination: Secondary | ICD-10-CM | POA: Diagnosis not present

## 2023-09-11 DIAGNOSIS — R1312 Dysphagia, oropharyngeal phase: Secondary | ICD-10-CM | POA: Diagnosis not present

## 2023-09-11 DIAGNOSIS — I69891 Dysphagia following other cerebrovascular disease: Secondary | ICD-10-CM | POA: Diagnosis not present

## 2023-09-12 DIAGNOSIS — R269 Unspecified abnormalities of gait and mobility: Secondary | ICD-10-CM | POA: Diagnosis not present

## 2023-09-12 DIAGNOSIS — M6281 Muscle weakness (generalized): Secondary | ICD-10-CM | POA: Diagnosis not present

## 2023-09-15 DIAGNOSIS — M6281 Muscle weakness (generalized): Secondary | ICD-10-CM | POA: Diagnosis not present

## 2023-09-15 DIAGNOSIS — R279 Unspecified lack of coordination: Secondary | ICD-10-CM | POA: Diagnosis not present

## 2023-09-15 DIAGNOSIS — R2681 Unsteadiness on feet: Secondary | ICD-10-CM | POA: Diagnosis not present

## 2023-09-16 DIAGNOSIS — I69828 Other speech and language deficits following other cerebrovascular disease: Secondary | ICD-10-CM | POA: Diagnosis not present

## 2023-09-16 DIAGNOSIS — R1312 Dysphagia, oropharyngeal phase: Secondary | ICD-10-CM | POA: Diagnosis not present

## 2023-09-16 DIAGNOSIS — I69891 Dysphagia following other cerebrovascular disease: Secondary | ICD-10-CM | POA: Diagnosis not present

## 2023-09-16 DIAGNOSIS — R41841 Cognitive communication deficit: Secondary | ICD-10-CM | POA: Diagnosis not present

## 2023-09-17 DIAGNOSIS — R269 Unspecified abnormalities of gait and mobility: Secondary | ICD-10-CM | POA: Diagnosis not present

## 2023-09-17 DIAGNOSIS — M6281 Muscle weakness (generalized): Secondary | ICD-10-CM | POA: Diagnosis not present

## 2023-09-22 DIAGNOSIS — R2681 Unsteadiness on feet: Secondary | ICD-10-CM | POA: Diagnosis not present

## 2023-09-22 DIAGNOSIS — R279 Unspecified lack of coordination: Secondary | ICD-10-CM | POA: Diagnosis not present

## 2023-09-22 DIAGNOSIS — M6281 Muscle weakness (generalized): Secondary | ICD-10-CM | POA: Diagnosis not present

## 2023-09-23 DIAGNOSIS — R41841 Cognitive communication deficit: Secondary | ICD-10-CM | POA: Diagnosis not present

## 2023-09-23 DIAGNOSIS — I69891 Dysphagia following other cerebrovascular disease: Secondary | ICD-10-CM | POA: Diagnosis not present

## 2023-09-23 DIAGNOSIS — R1312 Dysphagia, oropharyngeal phase: Secondary | ICD-10-CM | POA: Diagnosis not present

## 2023-09-23 DIAGNOSIS — I69828 Other speech and language deficits following other cerebrovascular disease: Secondary | ICD-10-CM | POA: Diagnosis not present

## 2023-09-24 DIAGNOSIS — R269 Unspecified abnormalities of gait and mobility: Secondary | ICD-10-CM | POA: Diagnosis not present

## 2023-09-24 DIAGNOSIS — R2681 Unsteadiness on feet: Secondary | ICD-10-CM | POA: Diagnosis not present

## 2023-09-24 DIAGNOSIS — M6281 Muscle weakness (generalized): Secondary | ICD-10-CM | POA: Diagnosis not present

## 2023-09-24 DIAGNOSIS — R279 Unspecified lack of coordination: Secondary | ICD-10-CM | POA: Diagnosis not present

## 2023-09-25 DIAGNOSIS — R41841 Cognitive communication deficit: Secondary | ICD-10-CM | POA: Diagnosis not present

## 2023-09-25 DIAGNOSIS — I69891 Dysphagia following other cerebrovascular disease: Secondary | ICD-10-CM | POA: Diagnosis not present

## 2023-09-25 DIAGNOSIS — I69828 Other speech and language deficits following other cerebrovascular disease: Secondary | ICD-10-CM | POA: Diagnosis not present

## 2023-09-25 DIAGNOSIS — R1312 Dysphagia, oropharyngeal phase: Secondary | ICD-10-CM | POA: Diagnosis not present

## 2023-09-26 DIAGNOSIS — R269 Unspecified abnormalities of gait and mobility: Secondary | ICD-10-CM | POA: Diagnosis not present

## 2023-09-26 DIAGNOSIS — M6281 Muscle weakness (generalized): Secondary | ICD-10-CM | POA: Diagnosis not present

## 2023-09-27 DIAGNOSIS — R2681 Unsteadiness on feet: Secondary | ICD-10-CM | POA: Diagnosis not present

## 2023-09-27 DIAGNOSIS — M6281 Muscle weakness (generalized): Secondary | ICD-10-CM | POA: Diagnosis not present

## 2023-09-27 DIAGNOSIS — R279 Unspecified lack of coordination: Secondary | ICD-10-CM | POA: Diagnosis not present

## 2023-09-28 DIAGNOSIS — M6281 Muscle weakness (generalized): Secondary | ICD-10-CM | POA: Diagnosis not present

## 2023-09-28 DIAGNOSIS — R269 Unspecified abnormalities of gait and mobility: Secondary | ICD-10-CM | POA: Diagnosis not present

## 2023-09-29 DIAGNOSIS — M6281 Muscle weakness (generalized): Secondary | ICD-10-CM | POA: Diagnosis not present

## 2023-09-29 DIAGNOSIS — R279 Unspecified lack of coordination: Secondary | ICD-10-CM | POA: Diagnosis not present

## 2023-09-29 DIAGNOSIS — R2681 Unsteadiness on feet: Secondary | ICD-10-CM | POA: Diagnosis not present

## 2023-09-30 DIAGNOSIS — R41841 Cognitive communication deficit: Secondary | ICD-10-CM | POA: Diagnosis not present

## 2023-09-30 DIAGNOSIS — I69828 Other speech and language deficits following other cerebrovascular disease: Secondary | ICD-10-CM | POA: Diagnosis not present

## 2023-09-30 DIAGNOSIS — I69891 Dysphagia following other cerebrovascular disease: Secondary | ICD-10-CM | POA: Diagnosis not present

## 2023-09-30 DIAGNOSIS — R1312 Dysphagia, oropharyngeal phase: Secondary | ICD-10-CM | POA: Diagnosis not present

## 2023-10-02 DIAGNOSIS — M6281 Muscle weakness (generalized): Secondary | ICD-10-CM | POA: Diagnosis not present

## 2023-10-02 DIAGNOSIS — R41841 Cognitive communication deficit: Secondary | ICD-10-CM | POA: Diagnosis not present

## 2023-10-02 DIAGNOSIS — R1312 Dysphagia, oropharyngeal phase: Secondary | ICD-10-CM | POA: Diagnosis not present

## 2023-10-02 DIAGNOSIS — I69891 Dysphagia following other cerebrovascular disease: Secondary | ICD-10-CM | POA: Diagnosis not present

## 2023-10-02 DIAGNOSIS — I69828 Other speech and language deficits following other cerebrovascular disease: Secondary | ICD-10-CM | POA: Diagnosis not present

## 2023-10-02 DIAGNOSIS — R269 Unspecified abnormalities of gait and mobility: Secondary | ICD-10-CM | POA: Diagnosis not present

## 2023-10-04 DIAGNOSIS — I69828 Other speech and language deficits following other cerebrovascular disease: Secondary | ICD-10-CM | POA: Diagnosis not present

## 2023-10-04 DIAGNOSIS — R1312 Dysphagia, oropharyngeal phase: Secondary | ICD-10-CM | POA: Diagnosis not present

## 2023-10-04 DIAGNOSIS — R41841 Cognitive communication deficit: Secondary | ICD-10-CM | POA: Diagnosis not present

## 2023-10-04 DIAGNOSIS — I69891 Dysphagia following other cerebrovascular disease: Secondary | ICD-10-CM | POA: Diagnosis not present

## 2023-10-06 DIAGNOSIS — G20A1 Parkinson's disease without dyskinesia, without mention of fluctuations: Secondary | ICD-10-CM | POA: Diagnosis not present

## 2023-10-07 DIAGNOSIS — R1312 Dysphagia, oropharyngeal phase: Secondary | ICD-10-CM | POA: Diagnosis not present

## 2023-10-07 DIAGNOSIS — I69891 Dysphagia following other cerebrovascular disease: Secondary | ICD-10-CM | POA: Diagnosis not present

## 2023-10-07 DIAGNOSIS — R41841 Cognitive communication deficit: Secondary | ICD-10-CM | POA: Diagnosis not present

## 2023-10-07 DIAGNOSIS — I69828 Other speech and language deficits following other cerebrovascular disease: Secondary | ICD-10-CM | POA: Diagnosis not present

## 2023-10-08 DIAGNOSIS — M6281 Muscle weakness (generalized): Secondary | ICD-10-CM | POA: Diagnosis not present

## 2023-10-08 DIAGNOSIS — R269 Unspecified abnormalities of gait and mobility: Secondary | ICD-10-CM | POA: Diagnosis not present

## 2023-10-10 DIAGNOSIS — M6281 Muscle weakness (generalized): Secondary | ICD-10-CM | POA: Diagnosis not present

## 2023-10-10 DIAGNOSIS — R269 Unspecified abnormalities of gait and mobility: Secondary | ICD-10-CM | POA: Diagnosis not present

## 2023-10-16 DIAGNOSIS — I69891 Dysphagia following other cerebrovascular disease: Secondary | ICD-10-CM | POA: Diagnosis not present

## 2023-10-16 DIAGNOSIS — R41841 Cognitive communication deficit: Secondary | ICD-10-CM | POA: Diagnosis not present

## 2023-10-16 DIAGNOSIS — I69828 Other speech and language deficits following other cerebrovascular disease: Secondary | ICD-10-CM | POA: Diagnosis not present

## 2023-10-16 DIAGNOSIS — R1312 Dysphagia, oropharyngeal phase: Secondary | ICD-10-CM | POA: Diagnosis not present

## 2023-10-17 DIAGNOSIS — R269 Unspecified abnormalities of gait and mobility: Secondary | ICD-10-CM | POA: Diagnosis not present

## 2023-10-17 DIAGNOSIS — M6281 Muscle weakness (generalized): Secondary | ICD-10-CM | POA: Diagnosis not present

## 2023-10-18 DIAGNOSIS — M6281 Muscle weakness (generalized): Secondary | ICD-10-CM | POA: Diagnosis not present

## 2023-10-18 DIAGNOSIS — R269 Unspecified abnormalities of gait and mobility: Secondary | ICD-10-CM | POA: Diagnosis not present

## 2023-10-21 DIAGNOSIS — R1312 Dysphagia, oropharyngeal phase: Secondary | ICD-10-CM | POA: Diagnosis not present

## 2023-10-21 DIAGNOSIS — I69891 Dysphagia following other cerebrovascular disease: Secondary | ICD-10-CM | POA: Diagnosis not present

## 2023-10-21 DIAGNOSIS — R41841 Cognitive communication deficit: Secondary | ICD-10-CM | POA: Diagnosis not present

## 2023-10-21 DIAGNOSIS — I69828 Other speech and language deficits following other cerebrovascular disease: Secondary | ICD-10-CM | POA: Diagnosis not present

## 2023-10-22 DIAGNOSIS — R269 Unspecified abnormalities of gait and mobility: Secondary | ICD-10-CM | POA: Diagnosis not present

## 2023-10-22 DIAGNOSIS — M6281 Muscle weakness (generalized): Secondary | ICD-10-CM | POA: Diagnosis not present

## 2023-10-25 DIAGNOSIS — I69828 Other speech and language deficits following other cerebrovascular disease: Secondary | ICD-10-CM | POA: Diagnosis not present

## 2023-10-25 DIAGNOSIS — R269 Unspecified abnormalities of gait and mobility: Secondary | ICD-10-CM | POA: Diagnosis not present

## 2023-10-25 DIAGNOSIS — M6281 Muscle weakness (generalized): Secondary | ICD-10-CM | POA: Diagnosis not present

## 2023-10-25 DIAGNOSIS — R41841 Cognitive communication deficit: Secondary | ICD-10-CM | POA: Diagnosis not present

## 2023-10-25 DIAGNOSIS — I69891 Dysphagia following other cerebrovascular disease: Secondary | ICD-10-CM | POA: Diagnosis not present

## 2023-10-25 DIAGNOSIS — R1312 Dysphagia, oropharyngeal phase: Secondary | ICD-10-CM | POA: Diagnosis not present

## 2023-10-28 DIAGNOSIS — R41841 Cognitive communication deficit: Secondary | ICD-10-CM | POA: Diagnosis not present

## 2023-10-28 DIAGNOSIS — I69828 Other speech and language deficits following other cerebrovascular disease: Secondary | ICD-10-CM | POA: Diagnosis not present

## 2023-10-28 DIAGNOSIS — R1312 Dysphagia, oropharyngeal phase: Secondary | ICD-10-CM | POA: Diagnosis not present

## 2023-10-28 DIAGNOSIS — I69891 Dysphagia following other cerebrovascular disease: Secondary | ICD-10-CM | POA: Diagnosis not present

## 2023-10-31 DIAGNOSIS — R269 Unspecified abnormalities of gait and mobility: Secondary | ICD-10-CM | POA: Diagnosis not present

## 2023-10-31 DIAGNOSIS — M6281 Muscle weakness (generalized): Secondary | ICD-10-CM | POA: Diagnosis not present

## 2023-11-04 DIAGNOSIS — M6281 Muscle weakness (generalized): Secondary | ICD-10-CM | POA: Diagnosis not present

## 2023-11-04 DIAGNOSIS — R269 Unspecified abnormalities of gait and mobility: Secondary | ICD-10-CM | POA: Diagnosis not present

## 2023-11-06 DIAGNOSIS — R41841 Cognitive communication deficit: Secondary | ICD-10-CM | POA: Diagnosis not present

## 2023-11-06 DIAGNOSIS — R1312 Dysphagia, oropharyngeal phase: Secondary | ICD-10-CM | POA: Diagnosis not present

## 2023-11-06 DIAGNOSIS — G20A1 Parkinson's disease without dyskinesia, without mention of fluctuations: Secondary | ICD-10-CM | POA: Diagnosis not present

## 2023-11-08 DIAGNOSIS — M6281 Muscle weakness (generalized): Secondary | ICD-10-CM | POA: Diagnosis not present

## 2023-11-08 DIAGNOSIS — R41841 Cognitive communication deficit: Secondary | ICD-10-CM | POA: Diagnosis not present

## 2023-11-08 DIAGNOSIS — R1312 Dysphagia, oropharyngeal phase: Secondary | ICD-10-CM | POA: Diagnosis not present

## 2023-11-08 DIAGNOSIS — R269 Unspecified abnormalities of gait and mobility: Secondary | ICD-10-CM | POA: Diagnosis not present

## 2023-11-11 ENCOUNTER — Telehealth: Payer: Self-pay | Admitting: Pulmonary Disease

## 2023-11-11 NOTE — Telephone Encounter (Signed)
 Pt spouse called in stating pt has Parkinson's disease and it is hard to get him in the office. She asked if the appt with Merry Proud, NP on 11/18/23 can be virtual, please advise.

## 2023-11-13 DIAGNOSIS — R41841 Cognitive communication deficit: Secondary | ICD-10-CM | POA: Diagnosis not present

## 2023-11-13 DIAGNOSIS — R1312 Dysphagia, oropharyngeal phase: Secondary | ICD-10-CM | POA: Diagnosis not present

## 2023-11-13 NOTE — Telephone Encounter (Signed)
 Spoke with wife per DPR and she is aware visit will need to be in person.

## 2023-11-15 DIAGNOSIS — R269 Unspecified abnormalities of gait and mobility: Secondary | ICD-10-CM | POA: Diagnosis not present

## 2023-11-15 DIAGNOSIS — M6281 Muscle weakness (generalized): Secondary | ICD-10-CM | POA: Diagnosis not present

## 2023-11-18 ENCOUNTER — Ambulatory Visit: Payer: Medicare PPO | Admitting: Pulmonary Disease

## 2023-11-19 ENCOUNTER — Other Ambulatory Visit: Payer: Self-pay

## 2023-11-19 ENCOUNTER — Emergency Department (HOSPITAL_COMMUNITY): Payer: Medicare PPO

## 2023-11-19 ENCOUNTER — Inpatient Hospital Stay (HOSPITAL_COMMUNITY)
Admission: EM | Admit: 2023-11-19 | Discharge: 2023-11-25 | DRG: 193 | Disposition: A | Discharge status: Nursing Home (Includes ICF) | Payer: Medicare PPO | Source: Skilled Nursing Facility | Attending: Internal Medicine | Admitting: Internal Medicine

## 2023-11-19 ENCOUNTER — Encounter (HOSPITAL_COMMUNITY): Payer: Self-pay

## 2023-11-19 DIAGNOSIS — J45909 Unspecified asthma, uncomplicated: Secondary | ICD-10-CM | POA: Diagnosis present

## 2023-11-19 DIAGNOSIS — Z993 Dependence on wheelchair: Secondary | ICD-10-CM

## 2023-11-19 DIAGNOSIS — R1310 Dysphagia, unspecified: Secondary | ICD-10-CM | POA: Diagnosis not present

## 2023-11-19 DIAGNOSIS — J180 Bronchopneumonia, unspecified organism: Secondary | ICD-10-CM | POA: Diagnosis present

## 2023-11-19 DIAGNOSIS — G20A1 Parkinson's disease without dyskinesia, without mention of fluctuations: Secondary | ICD-10-CM | POA: Diagnosis present

## 2023-11-19 DIAGNOSIS — Z7401 Bed confinement status: Secondary | ICD-10-CM | POA: Diagnosis not present

## 2023-11-19 DIAGNOSIS — J984 Other disorders of lung: Secondary | ICD-10-CM | POA: Diagnosis not present

## 2023-11-19 DIAGNOSIS — J168 Pneumonia due to other specified infectious organisms: Secondary | ICD-10-CM | POA: Diagnosis not present

## 2023-11-19 DIAGNOSIS — D509 Iron deficiency anemia, unspecified: Secondary | ICD-10-CM | POA: Diagnosis present

## 2023-11-19 DIAGNOSIS — R269 Unspecified abnormalities of gait and mobility: Secondary | ICD-10-CM | POA: Diagnosis not present

## 2023-11-19 DIAGNOSIS — J189 Pneumonia, unspecified organism: Secondary | ICD-10-CM | POA: Diagnosis not present

## 2023-11-19 DIAGNOSIS — N400 Enlarged prostate without lower urinary tract symptoms: Secondary | ICD-10-CM | POA: Diagnosis present

## 2023-11-19 DIAGNOSIS — R011 Cardiac murmur, unspecified: Secondary | ICD-10-CM | POA: Diagnosis not present

## 2023-11-19 DIAGNOSIS — M25551 Pain in right hip: Secondary | ICD-10-CM | POA: Diagnosis not present

## 2023-11-19 DIAGNOSIS — K5900 Constipation, unspecified: Secondary | ICD-10-CM | POA: Diagnosis present

## 2023-11-19 DIAGNOSIS — Z820 Family history of epilepsy and other diseases of the nervous system: Secondary | ICD-10-CM

## 2023-11-19 DIAGNOSIS — E43 Unspecified severe protein-calorie malnutrition: Secondary | ICD-10-CM | POA: Insufficient documentation

## 2023-11-19 DIAGNOSIS — K629 Disease of anus and rectum, unspecified: Secondary | ICD-10-CM | POA: Diagnosis not present

## 2023-11-19 DIAGNOSIS — R4182 Altered mental status, unspecified: Secondary | ICD-10-CM | POA: Diagnosis not present

## 2023-11-19 DIAGNOSIS — R0989 Other specified symptoms and signs involving the circulatory and respiratory systems: Secondary | ICD-10-CM | POA: Diagnosis not present

## 2023-11-19 DIAGNOSIS — R059 Cough, unspecified: Secondary | ICD-10-CM | POA: Diagnosis not present

## 2023-11-19 DIAGNOSIS — H4911 Fourth [trochlear] nerve palsy, right eye: Secondary | ICD-10-CM | POA: Diagnosis present

## 2023-11-19 DIAGNOSIS — N485 Ulcer of penis: Secondary | ICD-10-CM | POA: Diagnosis not present

## 2023-11-19 DIAGNOSIS — Z515 Encounter for palliative care: Secondary | ICD-10-CM | POA: Diagnosis not present

## 2023-11-19 DIAGNOSIS — Z91018 Allergy to other foods: Secondary | ICD-10-CM | POA: Diagnosis not present

## 2023-11-19 DIAGNOSIS — F0280 Dementia in other diseases classified elsewhere without behavioral disturbance: Secondary | ICD-10-CM | POA: Diagnosis present

## 2023-11-19 DIAGNOSIS — J069 Acute upper respiratory infection, unspecified: Secondary | ICD-10-CM | POA: Diagnosis not present

## 2023-11-19 DIAGNOSIS — G20B1 Parkinson's disease with dyskinesia, without mention of fluctuations: Secondary | ICD-10-CM

## 2023-11-19 DIAGNOSIS — Z66 Do not resuscitate: Secondary | ICD-10-CM | POA: Diagnosis present

## 2023-11-19 DIAGNOSIS — I10 Essential (primary) hypertension: Secondary | ICD-10-CM | POA: Diagnosis not present

## 2023-11-19 DIAGNOSIS — H9190 Unspecified hearing loss, unspecified ear: Secondary | ICD-10-CM | POA: Diagnosis present

## 2023-11-19 DIAGNOSIS — Z79899 Other long term (current) drug therapy: Secondary | ICD-10-CM | POA: Diagnosis not present

## 2023-11-19 DIAGNOSIS — Z8249 Family history of ischemic heart disease and other diseases of the circulatory system: Secondary | ICD-10-CM | POA: Diagnosis not present

## 2023-11-19 DIAGNOSIS — Z6822 Body mass index (BMI) 22.0-22.9, adult: Secondary | ICD-10-CM

## 2023-11-19 DIAGNOSIS — J09X1 Influenza due to identified novel influenza A virus with pneumonia: Secondary | ICD-10-CM | POA: Diagnosis present

## 2023-11-19 DIAGNOSIS — R0902 Hypoxemia: Secondary | ICD-10-CM | POA: Diagnosis not present

## 2023-11-19 DIAGNOSIS — R404 Transient alteration of awareness: Secondary | ICD-10-CM | POA: Diagnosis not present

## 2023-11-19 DIAGNOSIS — Z823 Family history of stroke: Secondary | ICD-10-CM

## 2023-11-19 DIAGNOSIS — Z7952 Long term (current) use of systemic steroids: Secondary | ICD-10-CM

## 2023-11-19 DIAGNOSIS — R0689 Other abnormalities of breathing: Secondary | ICD-10-CM | POA: Diagnosis not present

## 2023-11-19 DIAGNOSIS — B370 Candidal stomatitis: Secondary | ICD-10-CM | POA: Diagnosis present

## 2023-11-19 DIAGNOSIS — R918 Other nonspecific abnormal finding of lung field: Secondary | ICD-10-CM | POA: Diagnosis not present

## 2023-11-19 DIAGNOSIS — M6281 Muscle weakness (generalized): Secondary | ICD-10-CM | POA: Diagnosis not present

## 2023-11-19 DIAGNOSIS — I6782 Cerebral ischemia: Secondary | ICD-10-CM | POA: Diagnosis not present

## 2023-11-19 DIAGNOSIS — Z1152 Encounter for screening for COVID-19: Secondary | ICD-10-CM | POA: Diagnosis not present

## 2023-11-19 DIAGNOSIS — Z91048 Other nonmedicinal substance allergy status: Secondary | ICD-10-CM

## 2023-11-19 DIAGNOSIS — G8220 Paraplegia, unspecified: Secondary | ICD-10-CM | POA: Diagnosis not present

## 2023-11-19 DIAGNOSIS — I959 Hypotension, unspecified: Secondary | ICD-10-CM | POA: Diagnosis present

## 2023-11-19 DIAGNOSIS — E538 Deficiency of other specified B group vitamins: Secondary | ICD-10-CM | POA: Diagnosis present

## 2023-11-19 DIAGNOSIS — E876 Hypokalemia: Secondary | ICD-10-CM | POA: Diagnosis present

## 2023-11-19 DIAGNOSIS — R634 Abnormal weight loss: Secondary | ICD-10-CM | POA: Diagnosis not present

## 2023-11-19 DIAGNOSIS — I5032 Chronic diastolic (congestive) heart failure: Secondary | ICD-10-CM | POA: Diagnosis present

## 2023-11-19 DIAGNOSIS — J1008 Influenza due to other identified influenza virus with other specified pneumonia: Secondary | ICD-10-CM | POA: Diagnosis present

## 2023-11-19 DIAGNOSIS — Z833 Family history of diabetes mellitus: Secondary | ICD-10-CM

## 2023-11-19 LAB — COMPREHENSIVE METABOLIC PANEL
ALT: <5 U/L (ref 0–44)
AST: 13 U/L — ABNORMAL LOW (ref 15–41)
Albumin: 3.2 g/dL — ABNORMAL LOW (ref 3.5–5.0)
Alkaline Phosphatase: 40 U/L (ref 38–126)
Anion gap: 9 (ref 5–15)
BUN: 18 mg/dL (ref 8–23)
CO2: 26 mmol/L (ref 22–32)
Calcium: 7.5 mg/dL — ABNORMAL LOW (ref 8.9–10.3)
Chloride: 103 mmol/L (ref 98–111)
Creatinine, Ser: 0.53 mg/dL — ABNORMAL LOW (ref 0.61–1.24)
GFR, Estimated: >60 mL/min (ref >60)
Glucose, Bld: 114 mg/dL — ABNORMAL HIGH (ref 70–99)
Potassium: 2.6 mmol/L — CL (ref 3.5–5.1)
Sodium: 138 mmol/L (ref 135–145)
Total Bilirubin: 1.1 mg/dL (ref 0.0–1.2)
Total Protein: 5.8 g/dL — ABNORMAL LOW (ref 6.5–8.1)

## 2023-11-19 LAB — CBC WITH DIFFERENTIAL/PLATELET
Abs Immature Granulocytes: 0.05 10*3/uL (ref 0.00–0.07)
Basophils Absolute: 0 10*3/uL (ref 0.0–0.1)
Basophils Relative: 0 %
Eosinophils Absolute: 0 10*3/uL (ref 0.0–0.5)
Eosinophils Relative: 0 %
HCT: 35.3 % — ABNORMAL LOW (ref 39.0–52.0)
Hemoglobin: 12.3 g/dL — ABNORMAL LOW (ref 13.0–17.0)
Immature Granulocytes: 0 %
Lymphocytes Relative: 5 %
Lymphs Abs: 0.7 10*3/uL (ref 0.7–4.0)
MCH: 32.8 pg (ref 26.0–34.0)
MCHC: 34.8 g/dL (ref 30.0–36.0)
MCV: 94.1 fL (ref 80.0–100.0)
Monocytes Absolute: 1.1 10*3/uL — ABNORMAL HIGH (ref 0.1–1.0)
Monocytes Relative: 8 %
Neutro Abs: 11.8 10*3/uL — ABNORMAL HIGH (ref 1.7–7.7)
Neutrophils Relative %: 87 %
Platelets: 214 10*3/uL (ref 150–400)
RBC: 3.75 MIL/uL — ABNORMAL LOW (ref 4.22–5.81)
RDW: 12.1 % (ref 11.5–15.5)
WBC: 13.7 10*3/uL — ABNORMAL HIGH (ref 4.0–10.5)
nRBC: 0 % (ref 0.0–0.2)

## 2023-11-19 LAB — RETICULOCYTES
Immature Retic Fract: 10.1 % (ref 2.3–15.9)
RBC.: 3.78 MIL/uL — ABNORMAL LOW (ref 4.22–5.81)
Retic Count, Absolute: 26.8 10*3/uL (ref 19.0–186.0)
Retic Ct Pct: 0.7 % (ref 0.4–3.1)

## 2023-11-19 LAB — CK: Total CK: 108 U/L (ref 49–397)

## 2023-11-19 LAB — PROCALCITONIN: Procalcitonin: 0.69 ng/mL

## 2023-11-19 LAB — MAGNESIUM: Magnesium: 1.7 mg/dL (ref 1.7–2.4)

## 2023-11-19 LAB — RESP PANEL BY RT-PCR (RSV, FLU A&B, COVID)  RVPGX2
Influenza A by PCR: POSITIVE — AB
Influenza B by PCR: NEGATIVE
Resp Syncytial Virus by PCR: NEGATIVE
SARS Coronavirus 2 by RT PCR: NEGATIVE

## 2023-11-19 LAB — LIPASE, BLOOD: Lipase: 22 U/L (ref 11–51)

## 2023-11-19 LAB — PHOSPHORUS: Phosphorus: 3 mg/dL (ref 2.5–4.6)

## 2023-11-19 LAB — I-STAT CG4 LACTIC ACID, ED: Lactic Acid, Venous: 0.9 mmol/L (ref 0.5–1.9)

## 2023-11-19 MED ORDER — ONDANSETRON HCL 4 MG PO TABS: 4.0000 mg | ORAL_TABLET | Freq: Four times a day (QID) | ORAL | Status: DC | PRN

## 2023-11-19 MED ORDER — CARBIDOPA-LEVODOPA 25-100 MG PO TABS
2.0000 | ORAL_TABLET | ORAL | Status: DC
Start: 2023-11-20 — End: 2023-11-25
  Administered 2023-11-20 – 2023-11-25 (×28): 2 via ORAL
  Filled 2023-11-19 (×30): qty 2

## 2023-11-19 MED ORDER — AZITHROMYCIN 500 MG IV SOLR
500.0000 mg | INTRAVENOUS | Status: DC
  Administered 2023-11-20 – 2023-11-21 (×2): 500 mg via INTRAVENOUS
  Filled 2023-11-19 (×3): qty 5

## 2023-11-19 MED ORDER — POTASSIUM CHLORIDE 10 MEQ/100ML IV SOLN
10.0000 meq | INTRAVENOUS | Status: AC
  Administered 2023-11-19 – 2023-11-20 (×4): 10 meq via INTRAVENOUS
  Filled 2023-11-19 (×4): qty 100

## 2023-11-19 MED ORDER — MIDODRINE HCL 5 MG PO TABS
5.0000 mg | ORAL_TABLET | Freq: Three times a day (TID) | ORAL | Status: DC
Start: 2023-11-20 — End: 2023-11-25
  Administered 2023-11-20: 10 mg via ORAL
  Administered 2023-11-21 (×3): 5 mg via ORAL
  Administered 2023-11-22 – 2023-11-23 (×2): 10 mg via ORAL
  Administered 2023-11-24: 5 mg via ORAL
  Administered 2023-11-24 – 2023-11-25 (×2): 10 mg via ORAL
  Filled 2023-11-19 (×2): qty 2
  Filled 2023-11-19 (×2): qty 1
  Filled 2023-11-19 (×2): qty 2
  Filled 2023-11-19: qty 1
  Filled 2023-11-19: qty 2
  Filled 2023-11-19 (×3): qty 1

## 2023-11-19 MED ORDER — SODIUM CHLORIDE 0.9 % IV SOLN
500.0000 mg | Freq: Once | INTRAVENOUS | Status: AC
  Administered 2023-11-19: 500 mg via INTRAVENOUS
  Filled 2023-11-19: qty 5

## 2023-11-19 MED ORDER — HYDROCODONE-ACETAMINOPHEN 5-325 MG PO TABS
1.0000 | ORAL_TABLET | ORAL | Status: DC | PRN
Start: 2023-11-19 — End: 2023-11-25
  Administered 2023-11-19: 2 via ORAL
  Filled 2023-11-19: qty 2

## 2023-11-19 MED ORDER — SODIUM CHLORIDE 0.9 % IV SOLN
INTRAVENOUS | Status: AC
Start: 2023-11-19 — End: 2023-11-20

## 2023-11-19 MED ORDER — ACETAMINOPHEN 650 MG RE SUPP: 650.0000 mg | Freq: Four times a day (QID) | RECTAL | Status: DC | PRN

## 2023-11-19 MED ORDER — OSELTAMIVIR PHOSPHATE 75 MG PO CAPS
75.0000 mg | ORAL_CAPSULE | Freq: Two times a day (BID) | ORAL | Status: AC
  Administered 2023-11-19 – 2023-11-24 (×10): 75 mg via ORAL
  Filled 2023-11-19 (×10): qty 1

## 2023-11-19 MED ORDER — SODIUM CHLORIDE 0.9 % IV SOLN
1.0000 g | Freq: Once | INTRAVENOUS | Status: AC
  Administered 2023-11-19: 1 g via INTRAVENOUS
  Filled 2023-11-19: qty 10

## 2023-11-19 MED ORDER — ONDANSETRON HCL 4 MG/2ML IJ SOLN: 4.0000 mg | Freq: Four times a day (QID) | INTRAMUSCULAR | Status: DC | PRN

## 2023-11-19 MED ORDER — ACETAMINOPHEN 325 MG PO TABS: 650.0000 mg | ORAL_TABLET | Freq: Four times a day (QID) | ORAL | Status: DC | PRN

## 2023-11-19 MED ORDER — FLUDROCORTISONE ACETATE 0.1 MG PO TABS
0.1000 mg | ORAL_TABLET | Freq: Every day | ORAL | Status: DC
  Administered 2023-11-20 – 2023-11-24 (×5): 0.1 mg via ORAL
  Filled 2023-11-19 (×4): qty 1

## 2023-11-19 MED ORDER — GUAIFENESIN ER 600 MG PO TB12
600.0000 mg | ORAL_TABLET | Freq: Two times a day (BID) | ORAL | Status: DC
  Administered 2023-11-19 – 2023-11-24 (×11): 600 mg via ORAL
  Filled 2023-11-19 (×11): qty 1

## 2023-11-19 MED ORDER — ALBUTEROL SULFATE (2.5 MG/3ML) 0.083% IN NEBU
2.5000 mg | INHALATION_SOLUTION | RESPIRATORY_TRACT | Status: DC | PRN
Start: 2023-11-19 — End: 2023-11-25

## 2023-11-19 MED ORDER — CEFTRIAXONE SODIUM 2 G IJ SOLR
2.0000 g | INTRAMUSCULAR | Status: AC
  Administered 2023-11-20 – 2023-11-24 (×5): 2 g via INTRAVENOUS
  Filled 2023-11-19 (×5): qty 20

## 2023-11-19 MED ORDER — CARBIDOPA-LEVODOPA 25-100 MG PO TABS
3.0000 | ORAL_TABLET | ORAL | Status: DC
  Administered 2023-11-21 – 2023-11-25 (×5): 3 via ORAL
  Filled 2023-11-19 (×8): qty 3

## 2023-11-19 MED ORDER — CARBIDOPA-LEVODOPA ER 50-200 MG PO TBCR
1.0000 | EXTENDED_RELEASE_TABLET | Freq: Every day | ORAL | Status: DC
  Administered 2023-11-20 – 2023-11-24 (×5): 1 via ORAL
  Filled 2023-11-19 (×7): qty 1

## 2023-11-19 NOTE — Assessment & Plan Note (Signed)
- -  Patient presenting with  productive cough, and infiltrate in   lower lobe on chest x-ray -Infiltrate on CXR and 2-3 characteristics (fever, leukocytosis, purulent sputum) are consistent with pneumonia. -This appears to be most likely community-acquired pneumonia.       will admit for treatment of CAP will start on appropriate antibiotic coverage. - Rocephin/azithromycin   Obtain:  sputum cultures,                 influenza serologies negative                  COVID PCR   Pending                   blood cultures and sputum cultures ordered                   strep pneumo UA antigen,                    check for Legionella antigen.                Provide oxygen as needed.

## 2023-11-19 NOTE — ED Notes (Signed)
Provided patient with a pillow wife at bedside

## 2023-11-19 NOTE — Subjective & Objective (Signed)
Cough for 1 wk with AMS  BP 115/70  Noted to have 89 % RA Hx of Parkinson disease on outpatient-based palliative care.

## 2023-11-19 NOTE — Progress Notes (Signed)
[facility] ED 14  [facility] Liaison Note: This patient is currently enrolled in [facility] outpatient-based palliative care.  Please call for any outpatient based palliative care related questions or concerns. Thank you, Henderson Newcomer, LPN [facility] Liaison [phone]

## 2023-11-19 NOTE — ED Notes (Signed)
ED TO INPATIENT HANDOFF REPORT  Name/Age/Gender Zachary [name] 73 y.o. male  Code Status Code Status History     Date Active Date Inactive Code Status Order ID Comments User Context   05/29/2022 1225 06/01/2022 1840 Full Code [id number]  Bobette Mo, MD ED   05/29/2022 1225 05/29/2022 1225 Full Code [id number]  Bobette Mo, MD ED       Home/SNF/Other Skilled nursing facility  Chief Complaint CAP (community acquired pneumonia) [J18.9]  Level of Care/Admitting Diagnosis ED Disposition     ED Disposition  Admit   Condition  --   Comment  Hospital Area: [facility] [[id number]]  Level of Care: Telemetry [5]  Admit to tele based on following criteria: Other see comments  Comments: cap  May place patient in observation at [facility] or [facility] if equivalent level of care is available:: No  Covid Evaluation: Asymptomatic - no recent exposure (last 10 days) testing not required  Diagnosis: CAP (community acquired pneumonia) [644034]  Admitting Physician: Therisa Doyne [[id number]]  Attending Physician: Therisa Doyne [[id number]]          Medical History Past Medical History:  Diagnosis Date   Allergy    Asthma    Chest pain 09/22/2012   Frequent falls    Hearing loss 04/29/2019   utilizes bilateral hearing aids   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   Nocturia 04/15/2018   Orthostatic hypotension 03/02/2021   Parkinson's disease 06/1999   congenital R fourth nerve palsy    Allergies Allergies  Allergen Reactions   Pear Other (See Comments)    Unknown Not listed on MAR   Tree Extract Other (See Comments)    Unknown Not listed on MAR    IV Location/Drains/Wounds Patient Lines/Drains/Airways Status     Active Line/Drains/Airways     Name Placement date Placement time Site Days   Peripheral IV 11/19/23 20 G Anterior;Left Wrist 11/19/23  1644  Wrist  less than 1             Labs/Imaging Results for orders placed or performed during the hospital encounter of 11/19/23 (from the past 48 hours)  CBC with Differential     Status: Abnormal   Collection Time: 11/19/23  5:14 PM  Result Value Ref Range   WBC 13.7 (H) 4.0 - 10.5 K/uL   RBC 3.75 (L) 4.22 - 5.81 MIL/uL   Hemoglobin 12.3 (L) 13.0 - 17.0 g/dL   HCT 74.2 (L) 59.5 - 63.8 %   MCV 94.1 80.0 - 100.0 fL   MCH 32.8 26.0 - 34.0 pg   MCHC 34.8 30.0 - 36.0 g/dL   RDW 75.6 43.3 - 29.5 %   Platelets 214 150 - 400 K/uL   nRBC 0.0 0.0 - 0.2 %   Neutrophils Relative % 87 %   Neutro Abs 11.8 (H) 1.7 - 7.7 K/uL   Lymphocytes Relative 5 %   Lymphs Abs 0.7 0.7 - 4.0 K/uL   Monocytes Relative 8 %   Monocytes Absolute 1.1 (H) 0.1 - 1.0 K/uL   Eosinophils Relative 0 %   Eosinophils Absolute 0.0 0.0 - 0.5 K/uL   Basophils Relative 0 %   Basophils Absolute 0.0 0.0 - 0.1 K/uL   Immature Granulocytes 0 %   Abs Immature Granulocytes 0.05 0.00 - 0.07 K/uL    Comment: Performed at [facility], [address], [place], [place] [address]  Comprehensive metabolic panel     Status:  Abnormal   Collection Time: 11/19/23  5:14 PM  Result Value Ref Range   Sodium 138 135 - 145 mmol/L   Potassium 2.6 (LL) 3.5 - 5.1 mmol/L    Comment: CRITICAL RESULT CALLED TO, READ BACK BY AND VERIFIED WITH S.GRAY, RN AT 1937 ON 01.21.25 BY N.THOMPSON    Chloride 103 98 - 111 mmol/L   CO2 26 22 - 32 mmol/L   Glucose, Bld 114 (H) 70 - 99 mg/dL    Comment: Glucose reference range applies only to samples taken after fasting for at least 8 hours.   BUN 18 8 - 23 mg/dL   Creatinine, Ser 7.84 (L) 0.61 - 1.24 mg/dL   Calcium 7.5 (L) 8.9 - 10.3 mg/dL   Total Protein 5.8 (L) 6.5 - 8.1 g/dL   Albumin 3.2 (L) 3.5 - 5.0 g/dL   AST 13 (L) 15 - 41 U/L   ALT <5 0 - 44 U/L   Alkaline Phosphatase 40 38 - 126 U/L   Total Bilirubin 1.1 0.0 - 1.2 mg/dL   GFR, Estimated >69 >62 mL/min    Comment: (NOTE) Calculated using the CKD-EPI  Creatinine Equation (2021)    Anion gap 9 5 - 15    Comment: Performed at [facility], [address], [place], [place] [address]  Lipase, blood     Status: None   Collection Time: 11/19/23  5:14 PM  Result Value Ref Range   Lipase 22 11 - 51 U/L    Comment: Performed at [facility], [address] [place], [place] [address]  I-Stat CG4 Lactic Acid     Status: None   Collection Time: 11/19/23  5:21 PM  Result Value Ref Range   Lactic Acid, Venous 0.9 0.5 - 1.9 mmol/L   DG Chest 1 View Result Date: 11/19/2023 CLINICAL DATA:  Cough and congestion for 1 week EXAM: CHEST  1 VIEW COMPARISON:  06/05/2022 FINDINGS: Single frontal view of the chest demonstrates stable enlargement of the cardiac silhouette. There is patchy consolidation within the right infrahilar region, consistent with right lower lobe bronchopneumonia given clinical presentation. No effusion or pneumothorax. No acute bony abnormalities. IMPRESSION: 1. Patchy right lower lobe airspace disease consistent with bronchopneumonia given clinical presentation. Electronically Signed   By: Sharlet Salina M.D.   On: 11/19/2023 17:57   CT Head Wo Contrast Result Date: 11/19/2023 CLINICAL DATA:  Mental status change EXAM: CT HEAD WITHOUT CONTRAST TECHNIQUE: Contiguous axial images were obtained from the base of the skull through the vertex without intravenous contrast. RADIATION DOSE REDUCTION: This exam was performed according to the departmental dose-optimization program which includes automated exposure control, adjustment of the mA and/or kV according to patient size and/or use of iterative reconstruction technique. COMPARISON:  CT brain 05/29/2022 FINDINGS: Brain: No acute territorial infarction, hemorrhage or intracranial mass. Mild to moderate atrophy which appears slightly progressed. Slight interval increase in ventricle size likely due to atrophy progression. Moderate periventricular white matter  hypodensity consistent with chronic small vessel ischemic change Vascular: No hyperdense vessels.  No unexpected calcification Skull: Normal. Negative for fracture or focal lesion. Sinuses/Orbits: No acute finding. Other: None IMPRESSION: 1. No CT evidence for acute intracranial abnormality. 2. Atrophy and chronic small vessel ischemic changes of the white matter. Electronically Signed   By: Jasmine Pang M.D.   On: 11/19/2023 17:54    Pending Labs Unresulted Labs (From admission, onward)     Start     Ordered   11/20/23  0500  Vitamin B12  (Anemia Panel (PNL))  Tomorrow morning,   R        11/19/23 2028   11/20/23 0500  Folate  (Anemia Panel (PNL))  Tomorrow morning,   R        11/19/23 2028   11/20/23 0500  Prealbumin  Tomorrow morning,   R        11/19/23 2028   11/20/23 0000  Basic metabolic panel  Once,   STAT        11/19/23 2029   11/19/23 2034  MRSA Next Gen by PCR, Nasal  (Non-severe pneumonia (non-ICU care) in adult without resistant organism risk factors.)  Once,   URGENT       Question:  Patient immune status  Answer:  Normal   11/19/23 2034   11/19/23 2033  Expectorated Sputum Assessment w Gram Stain, Rflx to Resp Cult  Once,   R       Question:  Patient immune status  Answer:  Normal   11/19/23 2034   11/19/23 2033  Legionella Pneumophila Serogp 1 Ur Ag  Once,   URGENT        11/19/23 2034   11/19/23 2033  Strep pneumoniae urinary antigen  Once,   URGENT        11/19/23 2034   11/19/23 2032  Resp panel by RT-PCR (RSV, Flu A&B, Covid) Anterior Nasal Swab  Once,   URGENT        11/19/23 2031   11/19/23 2029  Iron and TIBC  (Anemia Panel (PNL))  Add-on,   AD        11/19/23 2028   11/19/23 2029  Ferritin  (Anemia Panel (PNL))  Add-on,   AD        11/19/23 2028   11/19/23 2029  Reticulocytes  (Anemia Panel (PNL))  Add-on,   AD        11/19/23 2028   11/19/23 2029  Procalcitonin  Add-on,   AD       References:    Procalcitonin Lower Respiratory Tract Infection AND Sepsis  Procalcitonin Algorithm   11/19/23 2028   11/19/23 2029  CK  Add-on,   AD        11/19/23 2028   11/19/23 2029  Magnesium  Add-on,   AD        11/19/23 2028   11/19/23 2029  Phosphorus  Add-on,   AD        11/19/23 2028   11/19/23 2029  Ammonia  Once,   STAT       Question:  Release to patient  Answer:  Immediate   11/19/23 2028   11/19/23 2029  Blood gas, venous  ONCE - STAT,   R       Question:  Release to patient  Answer:  Immediate   11/19/23 2028   11/19/23 1751  Culture, blood (routine x 2)  BLOOD CULTURE X 2,   R (with STAT occurrences)      11/19/23 1750   11/19/23 1708  Urinalysis, Routine w reflex microscopic -Urine, Clean Catch  Once,   URGENT       Question:  Specimen Source  Answer:  Urine, Clean Catch   11/19/23 1708            Vitals/Pain Today's Vitals   11/19/23 1700 11/19/23 1715 11/19/23 1815 11/19/23 2050  BP: 118/83 131/78 (!) 135/90   Pulse: 70 69 69   Resp:  (!) 22 (!) 22  Temp:    98.1 F (36.7 C)  TempSrc:    Axillary  SpO2: 92% 92% 93%   PainSc:        Isolation Precautions No active isolations  Medications Medications  potassium chloride 10 mEq in 100 mL IVPB (10 mEq Intravenous New Bag/Given 11/19/23 2024)  cefTRIAXone (ROCEPHIN) 2 g in sodium chloride 0.9 % 100 mL IVPB (has no administration in time range)  azithromycin (ZITHROMAX) 500 mg in sodium chloride 0.9 % 250 mL IVPB (has no administration in time range)  cefTRIAXone (ROCEPHIN) 1 g in sodium chloride 0.9 % 100 mL IVPB (0 g Intravenous Stopped 11/19/23 1903)  azithromycin (ZITHROMAX) 500 mg in sodium chloride 0.9 % 250 mL IVPB (0 mg Intravenous Stopped 11/19/23 1903)    Mobility non-ambulatory

## 2023-11-19 NOTE — H&P (Addendum)
Zachary [name] ZOX:[id number] DOB: [birth year] DOA: 11/19/2023     PCP: [name], MD   Outpatient Specialists:      [facility]rology    Dr. Arbutus Leas  Patient arrived to ER on 11/19/23 at 1639 Referred by Attending Coral Spikes, DO   Patient coming from:    From facility [facility], [place]    Chief Complaint:   Chief Complaint  Patient presents with   Altered Mental Status   Fever   Cough    HPI: Zachary [name] is a 73 y.o. male with medical history significant of Parkinson disease , seasonal allergies, asthma, BPH, orthostatic hypotension, history of syncope, anemia  Presented with  cough and confusion Cough for 1 wk with AMS  BP 115/70  Noted to have 89 % RA Hx of Parkinson disease on outpatient-based palliative care.     Denies significant ETOH intake   Does not smoke   No results found for: "SARSCOV2NAA"    Regarding pertinent Chronic problems:     Hypotension on midodrine    chronic CHF diastolic  - last echo  Recent Results (from the past 81191 hours)  ECHOCARDIOGRAM COMPLETE   Collection Time: 05/30/22  3:54 PM  Result Value   Weight 2,617.3   Height 69.5   BP 142/84   Single Plane A2C EF 47.4   Single Plane A4C EF 56.0   Calc EF 51.1   S' Lateral 3.20   AR max vel 2.51   AV Area VTI 2.85   AV Mean grad 5.0   AV Peak grad 10.8   Ao pk vel 1.64   Area-P 1/2 3.77   AV Area mean vel 2.91   MV VTI 3.35   Narrative      ECHOCARDIOGRAM REPORT       Patient Name:   Zachary [name] Date of Exam: 05/30/2022 Medical Rec #:  [id number]      Height:       69.5 in Accession #:    [id number]     Weight:       163.6 lb Date of Birth:  05/01/1951      BSA:          1.906 m Patient Age:    71 years       BP:           142/84 mmHg Patient Gender: M              HR:           67 bpm. Exam Location:  Inpatient  Procedure: 2D Echo, Cardiac Doppler and Color Doppler  Indications:    Syncope   History:        Patient has no prior history of Echocardiogram  examinations.                 Signs/Symptoms:Syncope. Parkiknson's disease.   Sonographer:    Mikki Harbor Referring Phys: [id number] DAVID MANUEL ORTIZ  IMPRESSIONS    1. Left ventricular ejection fraction, by estimation, is 60 to 65%. The left ventricle has normal function. The left ventricle has no regional wall motion abnormalities. Left ventricular diastolic parameters are consistent with Grade II diastolic  dysfunction (pseudonormalization).  2. Right ventricular systolic function is normal. The right ventricular size is normal. There is mildly elevated pulmonary artery systolic pressure. The estimated right ventricular systolic pressure is 37.2 mmHg.  3. Left atrial size was severely dilated.  4. Right atrial size was  moderately dilated.  5. The mitral valve is normal in structure. Mild mitral valve regurgitation. No evidence of mitral stenosis.  6. The aortic valve is tricuspid. There is mild calcification of the aortic valve. Aortic valve regurgitation is not visualized. No aortic stenosis is present.  7. The inferior vena cava is dilated in size with >50% respiratory variability, suggesting right atrial pressure of 8 mmHg.         Parkinson on carbidopa/levodopa        While in ER: Clinical Course as of 11/19/23 2019  Tue Nov 19, 2023  1648 CT head 05/29/22 per chart review:"IMPRESSION: No acute intracranial process." [TY]  1800 DG Chest 1 View IMPRESSION: 1. Patchy right lower lobe airspace disease consistent with bronchopneumonia given clinical presentation.   [TY]  1800 CT Head Wo Contrast IMPRESSION: 1. No CT evidence for acute intracranial abnormality. 2. Atrophy and chronic small vessel ischemic changes of the white matter.   [TY]  1800 WBC(!): 13.7 CXR with PNA; antibiotics ordered.  [TY]    Clinical Course User Index [TY] Coral Spikes, DO     Lab Orders         Culture, blood (routine x 2)         CBC with Differential         Comprehensive  metabolic panel         Lipase, blood         Urinalysis, Routine w reflex microscopic -Urine, Clean Catch         I-Stat CG4 Lactic Acid      CT HEAD   NON acute     CXR - CAP    Following Medications were ordered in ER: Medications  potassium chloride 10 mEq in 100 mL IVPB (has no administration in time range)  cefTRIAXone (ROCEPHIN) 1 g in sodium chloride 0.9 % 100 mL IVPB (0 g Intravenous Stopped 11/19/23 1903)  azithromycin (ZITHROMAX) 500 mg in sodium chloride 0.9 % 250 mL IVPB (0 mg Intravenous Stopped 11/19/23 1903)       ED Triage Vitals  Encounter Vitals Group     BP 11/19/23 1645 (!) 144/87     Systolic BP Percentile --      Diastolic BP Percentile --      Pulse Rate 11/19/23 1645 73     Resp 11/19/23 1645 (!) 28     Temp 11/19/23 1645 98.5 F (36.9 C)     Temp src --      SpO2 11/19/23 1645 95 %     Weight --      Height --      Head Circumference --      Peak Flow --      Pain Score 11/19/23 1643 0     Pain Loc --      Pain Education --      Exclude from Growth Chart --   YWVP(71)@     _________________________________________ Significant initial  Findings: Abnormal Labs Reviewed  CBC WITH DIFFERENTIAL/PLATELET - Abnormal; Notable for the following components:      Result Value   WBC 13.7 (*)    RBC 3.75 (*)    Hemoglobin 12.3 (*)    HCT 35.3 (*)    Neutro Abs 11.8 (*)    Monocytes Absolute 1.1 (*)    All other components within normal limits  COMPREHENSIVE METABOLIC PANEL - Abnormal; Notable for the following components:   Potassium 2.6 (*)    Glucose, Bld 114 (*)  Creatinine, Ser 0.53 (*)    Calcium 7.5 (*)    Total Protein 5.8 (*)    Albumin 3.2 (*)    AST 13 (*)    All other components within normal limits     ECG: Ordered     The recent clinical data is shown below. Vitals:   11/19/23 1645 11/19/23 1700 11/19/23 1715 11/19/23 1815  BP: (!) 144/87 118/83 131/78 (!) 135/90  Pulse: 73 70 69 69  Resp: (!) 28  (!) 22 (!) 22  Temp: 98.5 F  (36.9 C)     SpO2: 95% 92% 92% 93%    WBC     Component Value Date/Time   WBC 13.7 (H) 11/19/2023 1714   LYMPHSABS 0.7 11/19/2023 1714   MONOABS 1.1 (H) 11/19/2023 1714   EOSABS 0.0 11/19/2023 1714   BASOSABS 0.0 11/19/2023 1714    Lactic Acid, Venous    Component Value Date/Time   LATICACIDVEN 0.9 11/19/2023 1721    Procalcitonin   Ordered      UA  ordered    No results found for this or any previous visit.  ABX started Antibiotics Given (last 72 hours)     Date/Time Action Medication Dose Rate   11/19/23 1813 New Bag/Given   cefTRIAXone (ROCEPHIN) 1 g in sodium chloride 0.9 % 100 mL IVPB 1 g 200 mL/hr   11/19/23 1814 New Bag/Given   azithromycin (ZITHROMAX) 500 mg in sodium chloride 0.9 % 250 mL IVPB 500 mg 250 mL/hr      ___________________________________________________________  ______________________________________________ Recent Labs  Lab 11/19/23 1714  NA 138  K 2.6*  CO2 26  GLUCOSE 114*  BUN 18  CREATININE 0.53*  CALCIUM 7.5*    Cr    stable,  Lab Results  Component Value Date   CREATININE 0.53 (L) 11/19/2023   CREATININE 0.83 06/05/2022   CREATININE 0.69 06/03/2022    Recent Labs  Lab 11/19/23 1714  AST 13*  ALT <5  ALKPHOS 40  BILITOT 1.1  PROT 5.8*  ALBUMIN 3.2*   Lab Results  Component Value Date   CALCIUM 7.5 (L) 11/19/2023   PHOS 2.4 (L) 05/29/2022    Plt: Lab Results  Component Value Date   PLT 214 11/19/2023    Recent Labs  Lab 11/19/23 1714  WBC 13.7*  NEUTROABS 11.8*  HGB 12.3*  HCT 35.3*  MCV 94.1  PLT 214    HG/HCT  stable,       Component Value Date/Time   HGB 12.3 (L) 11/19/2023 1714   HCT 35.3 (L) 11/19/2023 1714   MCV 94.1 11/19/2023 1714     Recent Labs  Lab 11/19/23 1714  LIPASE 22    _______________________________________________ Hospitalist was called for admission for  CAP   The following Work up has been ordered so far:  Orders Placed This Encounter  Procedures   Culture, blood  (routine x 2)   CT Head Wo Contrast   DG Chest 1 View   CBC with Differential   Comprehensive metabolic panel   Lipase, blood   Urinalysis, Routine w reflex microscopic -Urine, Clean Catch   Consult to hospitalist   I-Stat CG4 Lactic Acid     OTHER Significant initial  Findings:  labs showing:     DM  labs:  HbA1C: No results for input(s): "HGBA1C" in the last 8760 hours.     CBG (last 3)  No results for input(s): "GLUCAP" in the last 72 hours.  Cultures: No results found for: "SDES", "SPECREQUEST", "CULT", "REPTSTATUS"   Radiological Exams on Admission: DG Chest 1 View Result Date: 11/19/2023 CLINICAL DATA:  Cough and congestion for 1 week EXAM: CHEST  1 VIEW COMPARISON:  06/05/2022 FINDINGS: Single frontal view of the chest demonstrates stable enlargement of the cardiac silhouette. There is patchy consolidation within the right infrahilar region, consistent with right lower lobe bronchopneumonia given clinical presentation. No effusion or pneumothorax. No acute bony abnormalities. IMPRESSION: 1. Patchy right lower lobe airspace disease consistent with bronchopneumonia given clinical presentation. Electronically Signed   By: Sharlet Salina M.D.   On: 11/19/2023 17:57   CT Head Wo Contrast Result Date: 11/19/2023 CLINICAL DATA:  Mental status change EXAM: CT HEAD WITHOUT CONTRAST TECHNIQUE: Contiguous axial images were obtained from the base of the skull through the vertex without intravenous contrast. RADIATION DOSE REDUCTION: This exam was performed according to the departmental dose-optimization program which includes automated exposure control, adjustment of the mA and/or kV according to patient size and/or use of iterative reconstruction technique. COMPARISON:  CT brain 05/29/2022 FINDINGS: Brain: No acute territorial infarction, hemorrhage or intracranial mass. Mild to moderate atrophy which appears slightly progressed. Slight interval increase in ventricle size likely due  to atrophy progression. Moderate periventricular white matter hypodensity consistent with chronic small vessel ischemic change Vascular: No hyperdense vessels.  No unexpected calcification Skull: Normal. Negative for fracture or focal lesion. Sinuses/Orbits: No acute finding. Other: None IMPRESSION: 1. No CT evidence for acute intracranial abnormality. 2. Atrophy and chronic small vessel ischemic changes of the white matter. Electronically Signed   By: Jasmine Pang M.D.   On: 11/19/2023 17:54   _______________________________________________________________________________________________________ Latest  Blood pressure (!) 135/90, pulse 69, temperature 98.5 F (36.9 C), resp. rate (!) 22, SpO2 93%.   Vitals  labs and radiology finding personally reviewed  Review of Systems:    Pertinent positives include:   shortness of breath at rest.  dyspnea on exertion, non-productive cough, Constitutional:  No weight loss, night sweats, Fevers, chills, fatigue, weight loss  HEENT:  No headaches, Difficulty swallowing,Tooth/dental problems,Sore throat,  No sneezing, itching, ear ache, nasal congestion, post nasal drip,  Cardio-vascular:  No chest pain, Orthopnea, PND, anasarca, dizziness, palpitations.no Bilateral lower extremity swelling  GI:  No heartburn, indigestion, abdominal pain, nausea, vomiting, diarrhea, change in bowel habits, loss of appetite, melena, blood in stool, hematemesis Resp:    No excess mucus, no productive cough, No  No coughing up of blood.No change in color of mucus.No wheezing. Skin:  no rash or lesions. No jaundice GU:  no dysuria, change in color of urine, no urgency or frequency. No straining to urinate.  No flank pain.  Musculoskeletal:  No joint pain or no joint swelling. No decreased range of motion. No back pain.  Psych:  No change in mood or affect. No depression or anxiety. No memory loss.  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 He has never used smokeless tobacco. He reports that he does not currently use alcohol. He reports that he does not use drugs.    Family History:   Family History  Problem Relation Age of Onset   Alzheimer's disease Mother    Heart disease Father    Tremor Father    Stroke Maternal Grandmother    Diabetes Child    ______________________________________________________________________________________________ Allergies: Allergies  Allergen Reactions   Pear Other (See Comments)    Unknown Not listed on MAR   Tree Extract Other (See Comments)    Unknown Not listed on MAR     Prior to Admission medications   Medication Sig Start Date End Date Taking? Authorizing Provider  AMBULATORY NON FORMULARY MEDICATION Transport chair Dx: Parkinsons Disease G20 12/24/19   [name], [name], DO  AMBULATORY NON FORMULARY MEDICATION DISCONTINUE ENTACAPONE 08/30/22   [name], [name], DO  carbidopa-levodopa (SINEMET CR) 50-200 MG tablet TAKE ONE TABLET BY MOUTH AT BEDTIME 01/03/22   [name], Rebecca S, DO  carbidopa-levodopa (SINEMET IR)  25-100 MG tablet 3 tabs AT 6:30AM/2 at 8:30AM/2 at 10:30AM/2 at 12:30PM/2 at 2:30PM/2 at 4:30pm/2 at 6:30pm. take up to extra tablet (in 1/2 tabs) per day AS NEEDED 09/05/21   [name], [name], DO  Cholecalciferol (DIALYVITE VITAMIN D 5000 PO) Take 1 tablet by mouth daily.    [provider]  divalproex (DEPAKOTE SPRINKLES) 125 MG capsule DISCONTINUE THIS MEDICATION 07/23/23   [name], [name], DO  finasteride (PROSCAR) 5 MG tablet Take 5 mg by mouth daily. 12/29/21   [provider]  fludrocortisone (FLORINEF) 0.1 MG tablet Take 1 tablet (0.1 mg total) by mouth 2 (two) times daily. Patient taking differently: Take 0.1 mg by mouth daily. 06/01/22   [name], MD  Lactobacillus (PROBIOTIC ACIDOPHILUS PO) Take 1 tablet by mouth daily.    [provider]  midodrine (PROAMATINE) 5 MG tablet TAKE (2) TABLETS BY MOUTH TWICE DAILY AT 8AM & 12PM. TAKE (1) TABLET BY MOUTH AT DINNER. 12/28/22   [name], [name], DO  Multiple Vitamins-Minerals (PRESERVISION AREDS 2 PO) Take 1 tablet by mouth daily.    [provider]  mupirocin ointment (BACTROBAN) 2 % Apply 1 Application topically 2 (two) times daily.    [provider]  potassium chloride SA (KLOR-CON M) 20 MEQ tablet Take 2 tablets (40 mEq total) by mouth 2 (two) times daily for 3 days. 06/01/22 06/04/22  [name], MD    ___________________________________________________________________________________________________ Physical Exam:    11/19/2023    6:15 PM 11/19/2023    5:15 PM 11/19/2023    5:00 PM  Vitals with BMI  Systolic 135 131 161  Diastolic 90 78 83  Pulse 69 69 70     1. General:  in No  Acute distress   Chronically ill -appearing 2. Psychological: Alert and   Oriented 3. Head/ENT:    Dry Mucous Membranes                          Head Non traumatic, neck supple  Poor Dentition 4. SKIN decreased Skin turgor,  Skin clean Dry and intact no rash    5. Heart: Regular rate  and rhythm systolic Murmur, no Rub or gallop 6. Lungs:  no wheezes some crackles   7. Abdomen: Soft,  non-tender, Non distended  bowel sounds present 8. Lower extremities: no clubbing, cyanosis, no  edema 9. Neurologically Grossly intact, moving all 4 extremities equally  10. MSK: Normal range of motion    Chart has been reviewed  ______________________________________________________________________________________________  Assessment/Plan  73 y.o. male with medical history significant of Parkinson disease , seasonal allergies, asthma, BPH, orthostatic hypotension, history of syncope, anemia  Admitted for CAP   Present on Admission:  CAP (community acquired pneumonia)  Hypokalemia  Parkinson's disease (HCC)  Influenza A with pneumonia     CAP (community acquired pneumonia)  - -Patient presenting with  productive cough, and infiltrate in   lower lobe on chest x-ray -Infiltrate on CXR and 2-3 characteristics (fever, leukocytosis, purulent sputum) are consistent with pneumonia. -This appears to be most likely community-acquired pneumonia.       will admit for treatment of CAP will start on appropriate antibiotic coverage. - Rocephin/azithromycin   Obtain:  sputum cultures,                 influenza serologies negative                  COVID PCR   Pending                   blood cultures and sputum cultures ordered                   strep pneumo UA antigen,                    check for Legionella antigen.                Provide oxygen as needed.    Hypokalemia - will replace electrolytes and repeat  check Mg, phos and Ca level and replace as needed Monitor on telemetry   Lab Results  Component Value Date   K 2.6 (LL) 11/19/2023     Lab Results  Component Value Date   CREATININE 0.53 (L) 11/19/2023   Lab Results  Component Value Date   MG 2.0 06/03/2022   Lab Results  Component Value Date   CALCIUM 7.5 (L) 11/19/2023   PHOS 2.4 (L) 05/29/2022     Parkinson's  disease (HCC) Continue home meds  Influenza A with pneumonia Start on Tamiflu Fluid ressusitate  Other plan as per orders.  DVT prophylaxis:  SCD     Code Status:      DNR/DNI   as per  family  I had personally discussed CODE STATUS with patient  ACP   has been reviewed     Family Communication:   Family  at  Bedside  plan of care was discussed  with  Wife,    Diet    Disposition Plan:      Back to current facility when stable                             Following barriers for discharge:  Electrolytes corrected                                white count improving able to transition to PO antibiotics                                    Consult Orders  (From admission, onward)           Start     Ordered   11/19/23 1949  Consult to hospitalist  Once       Provider:  (Not yet assigned)  Question Answer Comment  Place call to: [facility]   Reason for Consult Admit      11/19/23 1948                               Consults called: none     Admission status:  ED Disposition     ED Disposition  Admit   Condition  --   Comment  Hospital Area: [facility] [[id number]]  Level of Care: Telemetry [5]  Admit to tele based on following criteria: Other see comments  Comments: cap  May place patient in observation at [facility] or [facility] if equivalent level of care is available:: No  Covid Evaluation: Asymptomatic - no recent exposure (last 10 days) testing not required  Diagnosis: CAP (community acquired pneumonia) [543606]  Admitting Physician: Therisa Doyne [[id number]]  Attending Physician: Therisa Doyne [[id number]]           Obs     Level of care     tele  For  24H        Gabino Hagin 11/19/2023, 9:15 PM     [facility]     after 2 AM please page floor coverage PA If 7AM-7PM, please contact the day team taking care of the patient using [email or web address]

## 2023-11-19 NOTE — Assessment & Plan Note (Signed)
-

## 2023-11-19 NOTE — ED Notes (Signed)
1st bag of potassium stopped due to being complete at this time

## 2023-11-19 NOTE — ED Notes (Signed)
Patient resting in bed on monitor with wife at bedside

## 2023-11-19 NOTE — Assessment & Plan Note (Signed)
Start on Tamiflu Fluid ressusitate

## 2023-11-19 NOTE — Assessment & Plan Note (Signed)
-   will replace electrolytes and repeat  check Mg, phos and Ca level and replace as needed Monitor on telemetry   Lab Results  Component Value Date   K 2.6 (LL) 11/19/2023     Lab Results  Component Value Date   CREATININE 0.53 (L) 11/19/2023   Lab Results  Component Value Date   MG 2.0 06/03/2022   Lab Results  Component Value Date   CALCIUM 7.5 (L) 11/19/2023   PHOS 2.4 (L) 05/29/2022

## 2023-11-19 NOTE — ED Triage Notes (Signed)
EMS reports from [facility], called out for possible PNA, congestion and cough x 1 week with AMS since last night.  BP 115/70 HR 80 RR 30 Sp02 89 RA CBG 151  20ga L wrist

## 2023-11-20 ENCOUNTER — Observation Stay (HOSPITAL_COMMUNITY): Payer: Medicare PPO

## 2023-11-20 DIAGNOSIS — K5900 Constipation, unspecified: Secondary | ICD-10-CM | POA: Diagnosis present

## 2023-11-20 DIAGNOSIS — Z993 Dependence on wheelchair: Secondary | ICD-10-CM | POA: Diagnosis not present

## 2023-11-20 DIAGNOSIS — J09X1 Influenza due to identified novel influenza A virus with pneumonia: Secondary | ICD-10-CM | POA: Diagnosis not present

## 2023-11-20 DIAGNOSIS — G20B1 Parkinson's disease with dyskinesia, without mention of fluctuations: Secondary | ICD-10-CM | POA: Diagnosis not present

## 2023-11-20 DIAGNOSIS — F0280 Dementia in other diseases classified elsewhere without behavioral disturbance: Secondary | ICD-10-CM | POA: Diagnosis present

## 2023-11-20 DIAGNOSIS — D509 Iron deficiency anemia, unspecified: Secondary | ICD-10-CM | POA: Diagnosis present

## 2023-11-20 DIAGNOSIS — I5032 Chronic diastolic (congestive) heart failure: Secondary | ICD-10-CM | POA: Diagnosis present

## 2023-11-20 DIAGNOSIS — E538 Deficiency of other specified B group vitamins: Secondary | ICD-10-CM | POA: Diagnosis present

## 2023-11-20 DIAGNOSIS — J180 Bronchopneumonia, unspecified organism: Secondary | ICD-10-CM | POA: Diagnosis present

## 2023-11-20 DIAGNOSIS — Z6822 Body mass index (BMI) 22.0-22.9, adult: Secondary | ICD-10-CM | POA: Diagnosis not present

## 2023-11-20 DIAGNOSIS — H4911 Fourth [trochlear] nerve palsy, right eye: Secondary | ICD-10-CM | POA: Diagnosis present

## 2023-11-20 DIAGNOSIS — J189 Pneumonia, unspecified organism: Secondary | ICD-10-CM | POA: Diagnosis not present

## 2023-11-20 DIAGNOSIS — Z79899 Other long term (current) drug therapy: Secondary | ICD-10-CM | POA: Diagnosis not present

## 2023-11-20 DIAGNOSIS — Z515 Encounter for palliative care: Secondary | ICD-10-CM | POA: Diagnosis not present

## 2023-11-20 DIAGNOSIS — Z66 Do not resuscitate: Secondary | ICD-10-CM | POA: Diagnosis present

## 2023-11-20 DIAGNOSIS — B370 Candidal stomatitis: Secondary | ICD-10-CM | POA: Diagnosis present

## 2023-11-20 DIAGNOSIS — E43 Unspecified severe protein-calorie malnutrition: Secondary | ICD-10-CM | POA: Diagnosis present

## 2023-11-20 DIAGNOSIS — N400 Enlarged prostate without lower urinary tract symptoms: Secondary | ICD-10-CM | POA: Diagnosis present

## 2023-11-20 DIAGNOSIS — J45909 Unspecified asthma, uncomplicated: Secondary | ICD-10-CM | POA: Diagnosis present

## 2023-11-20 DIAGNOSIS — Z8249 Family history of ischemic heart disease and other diseases of the circulatory system: Secondary | ICD-10-CM | POA: Diagnosis not present

## 2023-11-20 DIAGNOSIS — E876 Hypokalemia: Secondary | ICD-10-CM | POA: Diagnosis present

## 2023-11-20 DIAGNOSIS — J1008 Influenza due to other identified influenza virus with other specified pneumonia: Secondary | ICD-10-CM | POA: Diagnosis present

## 2023-11-20 DIAGNOSIS — I959 Hypotension, unspecified: Secondary | ICD-10-CM | POA: Diagnosis present

## 2023-11-20 DIAGNOSIS — Z91048 Other nonmedicinal substance allergy status: Secondary | ICD-10-CM | POA: Diagnosis not present

## 2023-11-20 DIAGNOSIS — Z91018 Allergy to other foods: Secondary | ICD-10-CM | POA: Diagnosis not present

## 2023-11-20 DIAGNOSIS — Z1152 Encounter for screening for COVID-19: Secondary | ICD-10-CM | POA: Diagnosis not present

## 2023-11-20 DIAGNOSIS — G20A1 Parkinson's disease without dyskinesia, without mention of fluctuations: Secondary | ICD-10-CM | POA: Diagnosis present

## 2023-11-20 DIAGNOSIS — R011 Cardiac murmur, unspecified: Secondary | ICD-10-CM | POA: Diagnosis not present

## 2023-11-20 LAB — CBC
HCT: 33.8 % — ABNORMAL LOW (ref 39.0–52.0)
Hemoglobin: 11.2 g/dL — ABNORMAL LOW (ref 13.0–17.0)
MCH: 32.1 pg (ref 26.0–34.0)
MCHC: 33.1 g/dL (ref 30.0–36.0)
MCV: 96.8 fL (ref 80.0–100.0)
Platelets: 190 10*3/uL (ref 150–400)
RBC: 3.49 MIL/uL — ABNORMAL LOW (ref 4.22–5.81)
RDW: 12.1 % (ref 11.5–15.5)
WBC: 12.2 10*3/uL — ABNORMAL HIGH (ref 4.0–10.5)
nRBC: 0 % (ref 0.0–0.2)

## 2023-11-20 LAB — COMPREHENSIVE METABOLIC PANEL
ALT: 6 U/L (ref 0–44)
AST: 11 U/L — ABNORMAL LOW (ref 15–41)
Albumin: 3.1 g/dL — ABNORMAL LOW (ref 3.5–5.0)
Alkaline Phosphatase: 43 U/L (ref 38–126)
Anion gap: 7 (ref 5–15)
BUN: 20 mg/dL (ref 8–23)
CO2: 29 mmol/L (ref 22–32)
Calcium: 8.3 mg/dL — ABNORMAL LOW (ref 8.9–10.3)
Chloride: 102 mmol/L (ref 98–111)
Creatinine, Ser: 0.47 mg/dL — ABNORMAL LOW (ref 0.61–1.24)
GFR, Estimated: >60 mL/min (ref >60)
Glucose, Bld: 102 mg/dL — ABNORMAL HIGH (ref 70–99)
Potassium: 3.1 mmol/L — ABNORMAL LOW (ref 3.5–5.1)
Sodium: 138 mmol/L (ref 135–145)
Total Bilirubin: 1 mg/dL (ref 0.0–1.2)
Total Protein: 5.8 g/dL — ABNORMAL LOW (ref 6.5–8.1)

## 2023-11-20 LAB — BASIC METABOLIC PANEL
Anion gap: 11 (ref 5–15)
BUN: 20 mg/dL (ref 8–23)
CO2: 28 mmol/L (ref 22–32)
Calcium: 8.3 mg/dL — ABNORMAL LOW (ref 8.9–10.3)
Chloride: 101 mmol/L (ref 98–111)
Creatinine, Ser: 0.69 mg/dL (ref 0.61–1.24)
GFR, Estimated: >60 mL/min (ref >60)
Glucose, Bld: 120 mg/dL — ABNORMAL HIGH (ref 70–99)
Potassium: 2.9 mmol/L — ABNORMAL LOW (ref 3.5–5.1)
Sodium: 140 mmol/L (ref 135–145)

## 2023-11-20 LAB — PHOSPHORUS: Phosphorus: 2.5 mg/dL (ref 2.5–4.6)

## 2023-11-20 LAB — BLOOD GAS, VENOUS
Acid-Base Excess: 6.6 mmol/L — ABNORMAL HIGH (ref 0.0–2.0)
Bicarbonate: 30.5 mmol/L — ABNORMAL HIGH (ref 20.0–28.0)
O2 Saturation: 90.1 %
Patient temperature: 37
pCO2, Ven: 40 mm[Hg] — ABNORMAL LOW (ref 44–60)
pH, Ven: 7.49 — ABNORMAL HIGH (ref 7.25–7.43)
pO2, Ven: 57 mm[Hg] — ABNORMAL HIGH (ref 32–45)

## 2023-11-20 LAB — IRON AND TIBC
Iron: 23 ug/dL — ABNORMAL LOW (ref 45–182)
Saturation Ratios: 11 % — ABNORMAL LOW (ref 17.9–39.5)
TIBC: 214 ug/dL — ABNORMAL LOW (ref 250–450)
UIBC: 191 ug/dL

## 2023-11-20 LAB — FOLATE: Folate: 8.1 ng/mL (ref >5.9)

## 2023-11-20 LAB — URINALYSIS, ROUTINE W REFLEX MICROSCOPIC
Bacteria, UA: NONE SEEN
Bilirubin Urine: NEGATIVE
Glucose, UA: NEGATIVE mg/dL
Hgb urine dipstick: NEGATIVE
Ketones, ur: 5 mg/dL — AB
Leukocytes,Ua: NEGATIVE
Nitrite: NEGATIVE
Protein, ur: 30 mg/dL — AB
Specific Gravity, Urine: 1.026 (ref 1.005–1.030)
pH: 5 (ref 5.0–8.0)

## 2023-11-20 LAB — STREP PNEUMONIAE URINARY ANTIGEN: Strep Pneumo Urinary Antigen: NEGATIVE

## 2023-11-20 LAB — AMMONIA: Ammonia: 25 umol/L (ref 9–35)

## 2023-11-20 LAB — FERRITIN: Ferritin: 263 ng/mL (ref 24–336)

## 2023-11-20 LAB — MRSA NEXT GEN BY PCR, NASAL: MRSA by PCR Next Gen: NOT DETECTED

## 2023-11-20 LAB — PREALBUMIN: Prealbumin: 8 mg/dL — ABNORMAL LOW (ref 18–38)

## 2023-11-20 LAB — VITAMIN B12: Vitamin B-12: 155 pg/mL — ABNORMAL LOW (ref 180–914)

## 2023-11-20 LAB — MAGNESIUM: Magnesium: 2 mg/dL (ref 1.7–2.4)

## 2023-11-20 MED ORDER — CLONAZEPAM 0.125 MG PO TBDP
0.2500 mg | ORAL_TABLET | Freq: Two times a day (BID) | ORAL | Status: DC
  Administered 2023-11-20 – 2023-11-24 (×10): 0.25 mg via ORAL
  Filled 2023-11-20 (×10): qty 2

## 2023-11-20 MED ORDER — SODIUM CHLORIDE 0.9 % IV SOLN
INTRAVENOUS | Status: AC
Start: 2023-11-20 — End: 2023-11-20

## 2023-11-20 MED ORDER — POTASSIUM CHLORIDE 10 MEQ/100ML IV SOLN
10.0000 meq | INTRAVENOUS | Status: AC
  Administered 2023-11-20 (×3): 10 meq via INTRAVENOUS
  Filled 2023-11-20 (×3): qty 100

## 2023-11-20 MED ORDER — CLONAZEPAM 0.1 MG/ML ORAL SUSPENSION: 0.2500 mg | Freq: Two times a day (BID) | ORAL | Status: DC

## 2023-11-20 MED ORDER — POTASSIUM CHLORIDE 10 MEQ/100ML IV SOLN
INTRAVENOUS | Status: AC
  Administered 2023-11-20: 10 meq via INTRAVENOUS
  Filled 2023-11-20: qty 100

## 2023-11-20 MED ORDER — DM-GUAIFENESIN ER 30-600 MG PO TB12: 1.0000 | ORAL_TABLET | Freq: Two times a day (BID) | ORAL | Status: DC | PRN

## 2023-11-20 MED ORDER — ADULT MULTIVITAMIN W/MINERALS CH
1.0000 | ORAL_TABLET | Freq: Every day | ORAL | Status: DC
  Administered 2023-11-20 – 2023-11-24 (×5): 1 via ORAL
  Filled 2023-11-20 (×5): qty 1

## 2023-11-20 MED ORDER — MUCINEX DM 30-600 MG PO TB12: 1.0000 | ORAL_TABLET | Freq: Two times a day (BID) | ORAL | Status: DC | PRN

## 2023-11-20 MED ORDER — ENSURE ENLIVE PO LIQD
237.0000 mL | Freq: Two times a day (BID) | ORAL | Status: DC
  Administered 2023-11-20 – 2023-11-24 (×9): 237 mL via ORAL

## 2023-11-20 MED ORDER — FINASTERIDE 5 MG PO TABS
5.0000 mg | ORAL_TABLET | Freq: Every day | ORAL | Status: DC
  Administered 2023-11-20 – 2023-11-24 (×5): 5 mg via ORAL
  Filled 2023-11-20 (×5): qty 1

## 2023-11-20 NOTE — ED Provider Notes (Signed)
[facility] [place] [facility] Provider Note   CSN: [id number] Arrival date & time: 11/19/23  1639     History  Chief Complaint  Patient presents with   Altered Mental Status   Fever   Cough    BOL Zachary [name] is a 73 y.o. male.  73 year old male presenting emergency department with altered mental status and cough x 1 week.  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  Seemingly more lethargic today.     Altered Mental Status Associated symptoms: fever   Fever Associated symptoms: cough   Cough Associated symptoms: fever        Home Medications Prior to Admission medications   Medication Sig Start Date End Date Taking? Authorizing Provider  albuterol (VENTOLIN HFA) 108 (90 Base) MCG/ACT inhaler Inhale 2 puffs into the lungs every 6 (six) hours as needed for wheezing.   Yes [provider]  carbidopa-levodopa (SINEMET CR) 50-200 MG tablet TAKE ONE TABLET BY MOUTH AT BEDTIME Patient taking differently: Take 1 tablet by mouth at bedtime. 01/03/22  Yes [name], Zachary Batty, DO  carbidopa-levodopa (SINEMET IR) 25-100 MG tablet 3 tabs AT 6:30AM/2 at 8:30AM/2 at 10:30AM/2 at 12:30PM/2 at 2:30PM/2 at 4:30pm/2 at 6:30pm. take up to extra tablet (in 1/2 tabs) per day AS NEEDED Patient taking differently: Take 2-3 tablets by mouth See admin instructions. Take 3 tablets by mouth at 6:30 AM and 2 tablets by mouth at 8:30 AM, 10:30 AM, 12:30 PM, 2:30 PM, 4:30 PM, and 6:30 PM 09/05/21  Yes [name], Zachary Batty, DO  clonazePAM (KLONOPIN) 0.5 MG tablet Take 0.25 mg by mouth See admin instructions. Take 0.25 mg by mouth at 6:30 AM & 6:30 PM AND 0.25 mg every 12 hours as needed for severe anxiety or agitation   Yes [provider]  Dextromethorphan-guaiFENesin (MUCINEX DM) 30-600 MG TB12 Take 1 tablet by mouth every 12 (twelve) hours as needed (for congestion or coughing).   Yes [provider]  finasteride (PROSCAR)  5 MG tablet Take 5 mg by mouth daily at 8 pm.   Yes [provider]  fludrocortisone (FLORINEF) 0.1 MG tablet Take 1 tablet (0.1 mg total) by mouth 2 (two) times daily. Patient taking differently: Take 0.3 mg by mouth 2 (two) times daily. 06/01/22  Yes [name], [name], MD  midodrine (PROAMATINE) 5 MG tablet TAKE (2) TABLETS BY MOUTH TWICE DAILY AT 8AM & 12PM. TAKE (1) TABLET BY MOUTH AT DINNER. Patient taking differently: Take 5-10 mg by mouth See admin instructions. Take 10 mg by mouth at 8 AM & 12 NOON and 5 mg at 5 PM 12/28/22  Yes [name], Zachary Batty, DO  potassium chloride (KLOR-CON) 20 MEQ packet Take 40 mEq by mouth daily.   Yes [provider]  AMBULATORY NON FORMULARY MEDICATION Transport chair Dx: Parkinsons Disease G20 12/24/19   [name], Zachary Batty, DO  AMBULATORY NON FORMULARY MEDICATION DISCONTINUE ENTACAPONE Patient not taking: Reported on 11/19/2023 08/30/22   [name], Zachary Batty, DO  divalproex (DEPAKOTE SPRINKLES) 125 MG capsule DISCONTINUE THIS MEDICATION Patient not taking: Reported on 11/19/2023 07/23/23   [name], Zachary Batty, DO  potassium chloride SA (KLOR-CON M) 20 MEQ tablet Take 2 tablets (40 mEq total) by mouth 2 (two) times daily for 3 days. Patient not taking: Reported on 11/19/2023 06/01/22 06/04/22  [name], MD      Allergies    Pear and Tree extract    Review of  Systems   Review of Systems  Constitutional:  Positive for fever.  Respiratory:  Positive for cough.     Physical Exam Updated Vital Signs BP 139/79 (BP Location: Right Arm)   Pulse 70   Temp 99.4 F (37.4 C) (Axillary)   Resp 20   SpO2 99%  Physical Exam Vitals and nursing note reviewed.  Constitutional:      General: He is not in acute distress.    Appearance: He is not toxic-appearing.  HENT:     Head: Normocephalic.     Nose: Nose normal.     Mouth/Throat:     Mouth: Mucous membranes are moist.  Eyes:     Conjunctiva/sclera: Conjunctivae normal.  Cardiovascular:     Rate and  Rhythm: Normal rate and regular rhythm.  Pulmonary:     Effort: Pulmonary effort is normal.  Abdominal:     General: Abdomen is flat. There is no distension.     Palpations: Abdomen is soft.     Tenderness: There is no abdominal tenderness. There is no guarding or rebound.  Musculoskeletal:     Right lower leg: No edema.     Left lower leg: No edema.  Skin:    General: Skin is warm.     Capillary Refill: Capillary refill takes less than 2 seconds.  Neurological:     General: No focal deficit present.     Mental Status: He is alert. He is disoriented.  Psychiatric:        Mood and Affect: Mood normal.        Behavior: Behavior normal.     ED Results / Procedures / Treatments   Labs (all labs ordered are listed, but only abnormal results are displayed) Labs Reviewed  RESP PANEL BY RT-PCR (RSV, FLU A&B, COVID)  RVPGX2 - Abnormal; Notable for the following components:      Result Value   Influenza A by PCR POSITIVE (*)    All other components within normal limits  CBC WITH DIFFERENTIAL/PLATELET - Abnormal; Notable for the following components:   WBC 13.7 (*)    RBC 3.75 (*)    Hemoglobin 12.3 (*)    HCT 35.3 (*)    Neutro Abs 11.8 (*)    Monocytes Absolute 1.1 (*)    All other components within normal limits  COMPREHENSIVE METABOLIC PANEL - Abnormal; Notable for the following components:   Potassium 2.6 (*)    Glucose, Bld 114 (*)    Creatinine, Ser 0.53 (*)    Calcium 7.5 (*)    Total Protein 5.8 (*)    Albumin 3.2 (*)    AST 13 (*)    All other components within normal limits  RETICULOCYTES - Abnormal; Notable for the following components:   RBC. 3.78 (*)    All other components within normal limits  BLOOD GAS, VENOUS - Abnormal; Notable for the following components:   pH, Ven 7.49 (*)    pCO2, Ven 40 (*)    pO2, Ven 57 (*)    Bicarbonate 30.5 (*)    Acid-Base Excess 6.6 (*)    All other components within normal limits  CULTURE, BLOOD (ROUTINE X 2)  CULTURE,  BLOOD (ROUTINE X 2)  EXPECTORATED SPUTUM ASSESSMENT W GRAM STAIN, RFLX TO RESP C  MRSA NEXT GEN BY PCR, NASAL  LIPASE, BLOOD  PROCALCITONIN  CK  MAGNESIUM  PHOSPHORUS  URINALYSIS, ROUTINE W REFLEX MICROSCOPIC  VITAMIN B12  FOLATE  IRON AND TIBC  FERRITIN  AMMONIA  PREALBUMIN  BASIC METABOLIC PANEL  LEGIONELLA PNEUMOPHILA SEROGP 1 UR AG  STREP PNEUMONIAE URINARY ANTIGEN  MAGNESIUM  PHOSPHORUS  COMPREHENSIVE METABOLIC PANEL  CBC  I-STAT CG4 LACTIC ACID, ED  I-STAT CG4 LACTIC ACID, ED    EKG None  Radiology DG Chest 1 View Result Date: 11/19/2023 CLINICAL DATA:  Cough and congestion for 1 week EXAM: CHEST  1 VIEW COMPARISON:  06/05/2022 FINDINGS: Single frontal view of the chest demonstrates stable enlargement of the cardiac silhouette. There is patchy consolidation within the right infrahilar region, consistent with right lower lobe bronchopneumonia given clinical presentation. No effusion or pneumothorax. No acute bony abnormalities. IMPRESSION: 1. Patchy right lower lobe airspace disease consistent with bronchopneumonia given clinical presentation. Electronically Signed   By: Sharlet Salina M.D.   On: 11/19/2023 17:57   CT Head Wo Contrast Result Date: 11/19/2023 CLINICAL DATA:  Mental status change EXAM: CT HEAD WITHOUT CONTRAST TECHNIQUE: Contiguous axial images were obtained from the base of the skull through the vertex without intravenous contrast. RADIATION DOSE REDUCTION: This exam was performed according to the departmental dose-optimization program which includes automated exposure control, adjustment of the mA and/or kV according to patient size and/or use of iterative reconstruction technique. COMPARISON:  CT brain 05/29/2022 FINDINGS: Brain: No acute territorial infarction, hemorrhage or intracranial mass. Mild to moderate atrophy which appears slightly progressed. Slight interval increase in ventricle size likely due to atrophy progression. Moderate periventricular white  matter hypodensity consistent with chronic small vessel ischemic change Vascular: No hyperdense vessels.  No unexpected calcification Skull: Normal. Negative for fracture or focal lesion. Sinuses/Orbits: No acute finding. Other: None IMPRESSION: 1. No CT evidence for acute intracranial abnormality. 2. Atrophy and chronic small vessel ischemic changes of the white matter. Electronically Signed   By: Jasmine Pang M.D.   On: 11/19/2023 17:54    Procedures Procedures    Medications Ordered in ED Medications  potassium chloride 10 mEq in 100 mL IVPB (10 mEq Intravenous New Bag/Given 11/19/23 2314)  cefTRIAXone (ROCEPHIN) 2 g in sodium chloride 0.9 % 100 mL IVPB (has no administration in time range)  azithromycin (ZITHROMAX) 500 mg in sodium chloride 0.9 % 250 mL IVPB (has no administration in time range)  carbidopa-levodopa (SINEMET CR) 50-200 MG per tablet controlled release 1 tablet (has no administration in time range)  carbidopa-levodopa (SINEMET IR) 25-100 MG per tablet immediate release 2 tablet (has no administration in time range)  fludrocortisone (FLORINEF) tablet 0.1 mg (has no administration in time range)  midodrine (PROAMATINE) tablet 5-10 mg (has no administration in time range)  0.9 %  sodium chloride infusion (has no administration in time range)  acetaminophen (TYLENOL) tablet 650 mg (has no administration in time range)    Or  acetaminophen (TYLENOL) suppository 650 mg (has no administration in time range)  HYDROcodone-acetaminophen (NORCO/VICODIN) 5-325 MG per tablet 1-2 tablet (2 tablets Oral Given 11/19/23 2239)  ondansetron (ZOFRAN) tablet 4 mg (has no administration in time range)    Or  ondansetron (ZOFRAN) injection 4 mg (has no administration in time range)  albuterol (PROVENTIL) (2.5 MG/3ML) 0.083% nebulizer solution 2.5 mg (has no administration in time range)  guaiFENesin (MUCINEX) 12 hr tablet 600 mg (600 mg Oral Given 11/19/23 2239)  carbidopa-levodopa (SINEMET IR)  25-100 MG per tablet immediate release 3 tablet (has no administration in time range)  oseltamivir (TAMIFLU) capsule 75 mg (75 mg Oral Given 11/19/23 2240)  cefTRIAXone (ROCEPHIN) 1 g in sodium chloride 0.9 % 100 mL IVPB (0  g Intravenous Stopped 11/19/23 1903)  azithromycin (ZITHROMAX) 500 mg in sodium chloride 0.9 % 250 mL IVPB (0 mg Intravenous Stopped 11/19/23 1903)    ED Course/ Medical Decision Making/ A&P Clinical Course as of 11/20/23 0005  Tue Nov 19, 2023  1648 CT head 05/29/22 per chart review:"IMPRESSION: No acute intracranial process." [TY]  1800 DG Chest 1 View IMPRESSION: 1. Patchy right lower lobe airspace disease consistent with bronchopneumonia given clinical presentation.   [TY]  1800 CT Head Wo Contrast IMPRESSION: 1. No CT evidence for acute intracranial abnormality. 2. Atrophy and chronic small vessel ischemic changes of the white matter.   [TY]  1800 WBC(!): 13.7 CXR with PNA; antibiotics ordered.  [TY]    Clinical Course User Index [TY] Coral Spikes, DO                                 Medical Decision Making This is a 73 year old male with history of dementia secondary to his Parkinson's presented with altered mental status/delirium.  He is afebrile nontachycardic, slightly tachypneic.  Coarse breath sounds throughout.  Chest x-ray with pneumonia.  Has elevated white blood cell count.  Lactate not elevated.  Cover with antibiotics.  Low suspicion for acute intracranial pathology as he had no localizing deficits on exam, was following commands for me.  Per wife does have some waxing waning mentation at baseline.  Given patient's altered mental status with pneumonia will admit for antibiotics.  Amount and/or Complexity of Data Reviewed External Data Reviewed:     Details: History of Parkinson's and appears to take several centrally acting medications per chart review Labs: ordered. Decision-making details documented in ED Course. Radiology: ordered.  Decision-making details documented in ED Course. Discussion of management or test interpretation with external provider(s): Discussed with hospitalist who agrees to see and admit.  Risk Decision regarding hospitalization.           Final Clinical Impression(s) / ED Diagnoses Final diagnoses:  None    Rx / DC Orders ED Discharge Orders     None         Coral Spikes, DO 11/20/23 0005

## 2023-11-20 NOTE — Progress Notes (Signed)
2D echo attempted, Therapy with patient. WiIl try later

## 2023-11-20 NOTE — Evaluation (Signed)
Physical Therapy Evaluation Patient Details Name: Zachary [name] MRN: [id number] DOB: [birth year] Today's Date: 11/20/2023  History of Present Illness  Zachary [name] is a 73 y.o. male presents from ALF with cough, confusion, fever, community acquired pneumonia.. PMH:of Parkinson disease , seasonal allergies, asthma, BPH, orthostatic hypotension, history of syncope, anemia  Clinical Impression  Patient resting in bed, caregiver present.  Patient responds inconsistently with one word  .  Assisted to sitting onto bed edge with total assistance, Noted flexion contractures, long term per caregiver. At baseline patient  requires total care, is transferred to [facility] with pivot lift by caregivers.  PT to follow to prevent further decline in mobility while in acute care.      If plan is discharge home, recommend the following: Two people to help with walking and/or transfers;Two people to help with bathing/dressing/bathroom   Can travel by private vehicle        Equipment Recommendations None recommended by PT  Recommendations for Other Services       Functional Status Assessment Patient has not had a recent decline in their functional status     Precautions / Restrictions Precautions Precautions: Fall Precaution Comments: leg contrature Restrictions Weight Bearing Restrictions Per Provider Order: No      Mobility  Bed Mobility Overal bed mobility: Needs Assistance Bed Mobility: Supine to Sit, Sit to Supine     Supine to sit: +2 for safety/equipment, +2 for physical assistance, Total assist Sit to supine: +2 for safety/equipment, +2 for physical assistance, Total assist   General bed mobility comments: patient  does not assist in mobility    Transfers                   General transfer comment: NT    Ambulation/Gait                  Stairs            Wheelchair Mobility     Tilt Bed    Modified Rankin (Stroke Patients Only)       Balance  Overall balance assessment: Needs assistance Sitting-balance support: No upper extremity supported, Feet unsupported Sitting balance-Leahy Scale: Zero Sitting balance - Comments: gradual improvment with less tone in legs, able to  gradually hold self upright  from max to min support                                     Pertinent Vitals/Pain Pain Assessment Pain Assessment: No/denies pain    Home Living                   Home Equipment: Wheelchair - manual Additional Comments: has 24/7 caregivers    Prior Function Prior Level of Function : Needs assist             Mobility Comments: caregivers transfer to [facility]  r recliner, total lift /pivot ADLs Comments: total care     Extremity/Trunk Assessment   Upper Extremity Assessment Upper Extremity Assessment:  (stiffness in arms, able to hold a cup to drink with right hand.)    Lower Extremity Assessment Lower Extremity Assessment:  (flexion contractures in hips and knees, keeps  drawn up in  flexion)    Cervical / Trunk Assessment Cervical / Trunk Assessment: Kyphotic  Communication   Communication Communication: Difficulty communicating thoughts/reduced clarity of speech Cueing Techniques: Gestural cues;Tactile cues  Cognition Arousal:  Lethargic Behavior During Therapy: Flat affect Overall Cognitive Status: History of cognitive impairments - at baseline                                 General Comments: caregiver present to provide info, transfers with total assistance, requires total assist with  feeding        General Comments      Exercises     Assessment/Plan    PT Assessment Patient needs continued PT services  PT Problem List Decreased strength;Decreased balance;Decreased cognition;Impaired tone;Decreased activity tolerance;Decreased mobility;Decreased coordination       PT Treatment Interventions Therapeutic activities;Balance training;Functional mobility training     PT Goals (Current goals can be found in the Care Plan section)  Acute Rehab PT Goals Patient Stated Goal: to get back to faclity PT Goal Formulation: With family Time For Goal Achievement: 12/04/23 Potential to Achieve Goals: Fair    Frequency Min 1X/week     Co-evaluation               AM-PAC PT "6 Clicks" Mobility  Outcome Measure Help needed turning from your back to your side while in a flat bed without using bedrails?: Total Help needed moving from lying on your back to sitting on the side of a flat bed without using bedrails?: Total Help needed moving to and from a bed to a chair (including a wheelchair)?: Total Help needed standing up from a chair using your arms (e.g., wheelchair or bedside chair)?: Total Help needed to walk in hospital room?: Total Help needed climbing 3-5 steps with a railing? : Total 6 Click Score: 6    End of Session   Activity Tolerance: Patient tolerated treatment well Patient left: in bed;with bed alarm set;with family/visitor present Nurse Communication: Mobility status;Need for lift equipment PT Visit Diagnosis: Other abnormalities of gait and mobility (R26.89);Other symptoms and signs involving the nervous system (R29.898)    Time: 3474-2595 PT Time Calculation (min) (ACUTE ONLY): 23 min   Charges:   PT Evaluation $PT Eval Low Complexity: 1 Low PT Treatments $Therapeutic Activity: 8-22 mins PT General Charges $$ ACUTE PT VISIT: 1 Visit         Blanchard Kelch PT Acute Rehabilitation Services Office [phone] Weekend pager-[phone]   Rada Hay 11/20/2023, 3:18 PM

## 2023-11-20 NOTE — TOC Initial Note (Signed)
Transition of Care [facility]) - Initial/Assessment Note    Patient Details  Name: Zachary [name] MRN: [id number] Date of Birth: 1951-08-22  [facility]) CM/SW Contact:    Otelia Santee, LCSW Phone Number: 11/20/2023, 1:57 PM  Clinical Narrative:                 Pt from [facility]. Pt bed/wheelchair bound at baseline. Pt's spouse who is pt's HCPOA reports he is typically independent with eating and is active in community. Pt has 24/7 caregivers at facility through [employer] and private connections. Current plan for pt to return to ALF at discharge. TOC will continue to follow for discharge needs.   Expected Discharge Plan: Assisted Living Barriers to Discharge: Continued Medical Work up   Patient Goals and CMS Choice Patient states their goals for this hospitalization and ongoing recovery are:: For pt to return to [facility] [email or web address].gov Compare Post Acute Care list provided to:: Patient Represenative (must comment) Choice offered to / list presented to : [facility] POA / Guardian Isabella ownership interest in [facility].provided to::  (NA)    Expected Discharge Plan and Services In-house Referral: Clinical Social Work Discharge Planning Services: NA Post Acute Care Choice: Resumption of Svcs/PTA Provider Living arrangements for the past 2 months: Assisted Living Facility                 DME Arranged: N/A DME Agency: NA                  Prior Living Arrangements/Services Living arrangements for the past 2 months: Assisted Living Facility Lives with:: Facility Resident Patient language and need for interpreter reviewed:: Yes Do you feel safe going back to the place where you live?: Yes      Need for Family Participation in Patient Care: Yes (Comment) Care giver support system in place?: Yes (comment) Current home services: DME, Sitter, Other (comment) (Has wheelchair, 24/7 private caregivers with Risk analyst) Criminal  Activity/Legal Involvement Pertinent to Current Situation/Hospitalization: No - Comment as needed  Activities of Daily Living   ADL Screening (condition at time of admission) Independently performs ADLs?: No Does the patient have a NEW difficulty with bathing/dressing/toileting/self-feeding that is expected to last >3 days?: No Does the patient have a NEW difficulty with getting in/out of bed, walking, or climbing stairs that is expected to last >3 days?: No Does the patient have a NEW difficulty with communication that is expected to last >3 days?: No Is the patient deaf or have difficulty hearing?: No Does the patient have difficulty seeing, even when wearing glasses/contacts?: No Does the patient have difficulty concentrating, remembering, or making decisions?: Yes  Permission Sought/Granted Permission sought to share information with : Facility Medical sales representative, Family Supports    Share Information with NAME: Kolton, Hollander (Spouse)  [phone]  Permission granted to share info w AGENCY: [facility]        Emotional Assessment   Attitude/Demeanor/Rapport: Unable to Assess Affect (typically observed): Unable to Assess Orientation: :  (Disoriented x 4) Alcohol / Substance Use: Not Applicable Psych Involvement: No (comment)  Admission diagnosis:  CAP (community acquired pneumonia) [J18.9] Patient Active Problem List   Diagnosis Date Noted   Influenza A with pneumonia 11/19/2023   CAP (community acquired pneumonia) 05/29/2022   Hypokalemia 05/29/2022   Moderate protein malnutrition (HCC) 05/29/2022   Hypercarbia 05/29/2022   Hypochloremia 05/29/2022   Hypophosphatemia 05/29/2022   Normocytic anemia 05/29/2022   Syncope 01/01/2022  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 [place] [address] Phone: [phone] Fax: [phone]     Social Drivers of Health (SDOH) Social History: SDOH Screenings   Food Insecurity: No Food Insecurity (11/19/2023)  Housing: Low Risk  (11/19/2023)  Transportation Needs: No Transportation Needs (11/19/2023)  Utilities: Not At Risk (11/19/2023)  Social Connections: Patient Unable To Answer (11/19/2023)  Tobacco Use: Low Risk  (11/19/2023)   SDOH Interventions:     Readmission Risk Interventions    05/30/2022    1:49 PM  Readmission Risk Prevention Plan  Post Dischage Appt Complete  Medication Screening Complete  Transportation Screening Complete

## 2023-11-20 NOTE — ED Provider Notes (Incomplete)
[facility] Provider Note   CSN: [id number] Arrival date & time: 11/19/23  1639     History {Add pertinent medical, surgical, social history, OB history to HPI:1} Chief Complaint  Patient presents with  . Altered Mental Status  . Fever  . Cough    Zachary [name] is a 73 y.o. male.  73 year old male presenting emergency department with altered mental status and cough x 1 week.  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  Seemingly more lethargic today.     Altered Mental Status Associated symptoms: fever   Fever Associated symptoms: cough   Cough Associated symptoms: fever        Home Medications Prior to Admission medications   Medication Sig Start Date End Date Taking? Authorizing Provider  albuterol (VENTOLIN HFA) 108 (90 Base) MCG/ACT inhaler Inhale 2 puffs into the lungs every 6 (six) hours as needed for wheezing.   Yes [provider]  carbidopa-levodopa (SINEMET CR) 50-200 MG tablet TAKE ONE TABLET BY MOUTH AT BEDTIME Patient taking differently: Take 1 tablet by mouth at bedtime. 01/03/22  Yes [name], [name], DO  carbidopa-levodopa (SINEMET IR) 25-100 MG tablet 3 tabs AT 6:30AM/2 at 8:30AM/2 at 10:30AM/2 at 12:30PM/2 at 2:30PM/2 at 4:30pm/2 at 6:30pm. take up to extra tablet (in 1/2 tabs) per day AS NEEDED Patient taking differently: Take 2-3 tablets by mouth See admin instructions. Take 3 tablets by mouth at 6:30 AM and 2 tablets by mouth at 8:30 AM, 10:30 AM, 12:30 PM, 2:30 PM, 4:30 PM, and 6:30 PM 09/05/21  Yes [name], [name], DO  clonazePAM (KLONOPIN) 0.5 MG tablet Take 0.25 mg by mouth See admin instructions. Take 0.25 mg by mouth at 6:30 AM & 6:30 PM AND 0.25 mg every 12 hours as needed for severe anxiety or agitation   Yes [provider]  Dextromethorphan-guaiFENesin (MUCINEX DM) 30-600 MG TB12 Take 1 tablet by mouth every 12 (twelve) hours as needed (for  congestion or coughing).   Yes [provider]  finasteride (PROSCAR) 5 MG tablet Take 5 mg by mouth daily at 8 pm.   Yes [provider]  fludrocortisone (FLORINEF) 0.1 MG tablet Take 1 tablet (0.1 mg total) by mouth 2 (two) times daily. Patient taking differently: Take 0.3 mg by mouth 2 (two) times daily. 06/01/22  Yes [name], [name], MD  midodrine (PROAMATINE) 5 MG tablet TAKE (2) TABLETS BY MOUTH TWICE DAILY AT 8AM & 12PM. TAKE (1) TABLET BY MOUTH AT DINNER. Patient taking differently: Take 5-10 mg by mouth See admin instructions. Take 10 mg by mouth at 8 AM & 12 NOON and 5 mg at 5 PM 12/28/22  Yes [name], [name], DO  potassium chloride (KLOR-CON) 20 MEQ packet Take 40 mEq by mouth daily.   Yes [provider]  AMBULATORY NON FORMULARY MEDICATION Transport chair Dx: Parkinsons Disease G20 12/24/19   [name], [name], DO  AMBULATORY NON FORMULARY MEDICATION DISCONTINUE ENTACAPONE Patient not taking: Reported on 11/19/2023 08/30/22   [name], [name], DO  divalproex (DEPAKOTE SPRINKLES) 125 MG capsule DISCONTINUE THIS MEDICATION Patient not taking: Reported on 11/19/2023 07/23/23   [name], [name], DO  potassium chloride SA (KLOR-CON M) 20 MEQ tablet Take 2 tablets (40 mEq total) by mouth 2 (two) times daily for 3 days. Patient not taking: Reported on 11/19/2023 06/01/22 06/04/22  [name], MD      Allergies  Pear and Tree extract    Review of Systems   Review of Systems  Constitutional:  Positive for fever.  Respiratory:  Positive for cough.     Physical Exam Updated Vital Signs BP 139/79 (BP Location: Right Arm)   Pulse 70   Temp 99.4 F (37.4 C) (Axillary)   Resp 20   SpO2 99%  Physical Exam Vitals and nursing note reviewed.  Constitutional:      General: He is not in acute distress.    Appearance: He is not toxic-appearing.  HENT:     Head: Normocephalic.     Nose: Nose normal.     Mouth/Throat:     Mouth: Mucous membranes are moist.  Eyes:      Conjunctiva/sclera: Conjunctivae normal.  Cardiovascular:     Rate and Rhythm: Normal rate and regular rhythm.  Pulmonary:     Effort: Pulmonary effort is normal.  Abdominal:     General: Abdomen is flat. There is no distension.     Palpations: Abdomen is soft.     Tenderness: There is no abdominal tenderness. There is no guarding or rebound.  Musculoskeletal:     Right lower leg: No edema.     Left lower leg: No edema.  Skin:    General: Skin is warm.     Capillary Refill: Capillary refill takes less than 2 seconds.  Neurological:     General: No focal deficit present.     Mental Status: He is alert. He is disoriented.  Psychiatric:        Mood and Affect: Mood normal.        Behavior: Behavior normal.     ED Results / Procedures / Treatments   Labs (all labs ordered are listed, but only abnormal results are displayed) Labs Reviewed  RESP PANEL BY RT-PCR (RSV, FLU A&B, COVID)  RVPGX2 - Abnormal; Notable for the following components:      Result Value   Influenza A by PCR POSITIVE (*)    All other components within normal limits  CBC WITH DIFFERENTIAL/PLATELET - Abnormal; Notable for the following components:   WBC 13.7 (*)    RBC 3.75 (*)    Hemoglobin 12.3 (*)    HCT 35.3 (*)    Neutro Abs 11.8 (*)    Monocytes Absolute 1.1 (*)    All other components within normal limits  COMPREHENSIVE METABOLIC PANEL - Abnormal; Notable for the following components:   Potassium 2.6 (*)    Glucose, Bld 114 (*)    Creatinine, Ser 0.53 (*)    Calcium 7.5 (*)    Total Protein 5.8 (*)    Albumin 3.2 (*)    AST 13 (*)    All other components within normal limits  RETICULOCYTES - Abnormal; Notable for the following components:   RBC. 3.78 (*)    All other components within normal limits  CULTURE, BLOOD (ROUTINE X 2)  CULTURE, BLOOD (ROUTINE X 2)  EXPECTORATED SPUTUM ASSESSMENT W GRAM STAIN, RFLX TO RESP C  MRSA NEXT GEN BY PCR, NASAL  LIPASE, BLOOD  PROCALCITONIN  CK  MAGNESIUM   PHOSPHORUS  URINALYSIS, ROUTINE W REFLEX MICROSCOPIC  VITAMIN B12  FOLATE  IRON AND TIBC  FERRITIN  AMMONIA  BLOOD GAS, VENOUS  PREALBUMIN  BASIC METABOLIC PANEL  LEGIONELLA PNEUMOPHILA SEROGP 1 UR AG  STREP PNEUMONIAE URINARY ANTIGEN  MAGNESIUM  PHOSPHORUS  COMPREHENSIVE METABOLIC PANEL  CBC  I-STAT CG4 LACTIC ACID, ED  I-STAT CG4 LACTIC ACID, ED  EKG None  Radiology DG Chest 1 View Result Date: 11/19/2023 CLINICAL DATA:  Cough and congestion for 1 week EXAM: CHEST  1 VIEW COMPARISON:  06/05/2022 FINDINGS: Single frontal view of the chest demonstrates stable enlargement of the cardiac silhouette. There is patchy consolidation within the right infrahilar region, consistent with right lower lobe bronchopneumonia given clinical presentation. No effusion or pneumothorax. No acute bony abnormalities. IMPRESSION: 1. Patchy right lower lobe airspace disease consistent with bronchopneumonia given clinical presentation. Electronically Signed   By: Sharlet Salina M.D.   On: 11/19/2023 17:57   CT Head Wo Contrast Result Date: 11/19/2023 CLINICAL DATA:  Mental status change EXAM: CT HEAD WITHOUT CONTRAST TECHNIQUE: Contiguous axial images were obtained from the base of the skull through the vertex without intravenous contrast. RADIATION DOSE REDUCTION: This exam was performed according to the departmental dose-optimization program which includes automated exposure control, adjustment of the mA and/or kV according to patient size and/or use of iterative reconstruction technique. COMPARISON:  CT brain 05/29/2022 FINDINGS: Brain: No acute territorial infarction, hemorrhage or intracranial mass. Mild to moderate atrophy which appears slightly progressed. Slight interval increase in ventricle size likely due to atrophy progression. Moderate periventricular white matter hypodensity consistent with chronic small vessel ischemic change Vascular: No hyperdense vessels.  No unexpected calcification Skull:  Normal. Negative for fracture or focal lesion. Sinuses/Orbits: No acute finding. Other: None IMPRESSION: 1. No CT evidence for acute intracranial abnormality. 2. Atrophy and chronic small vessel ischemic changes of the white matter. Electronically Signed   By: Jasmine Pang M.D.   On: 11/19/2023 17:54    Procedures Procedures  {Document cardiac monitor, telemetry assessment procedure when appropriate:1}  Medications Ordered in ED Medications  potassium chloride 10 mEq in 100 mL IVPB (10 mEq Intravenous New Bag/Given 11/19/23 2314)  cefTRIAXone (ROCEPHIN) 2 g in sodium chloride 0.9 % 100 mL IVPB (has no administration in time range)  azithromycin (ZITHROMAX) 500 mg in sodium chloride 0.9 % 250 mL IVPB (has no administration in time range)  carbidopa-levodopa (SINEMET CR) 50-200 MG per tablet controlled release 1 tablet (has no administration in time range)  carbidopa-levodopa (SINEMET IR) 25-100 MG per tablet immediate release 2 tablet (has no administration in time range)  fludrocortisone (FLORINEF) tablet 0.1 mg (has no administration in time range)  midodrine (PROAMATINE) tablet 5-10 mg (has no administration in time range)  0.9 %  sodium chloride infusion (has no administration in time range)  acetaminophen (TYLENOL) tablet 650 mg (has no administration in time range)    Or  acetaminophen (TYLENOL) suppository 650 mg (has no administration in time range)  HYDROcodone-acetaminophen (NORCO/VICODIN) 5-325 MG per tablet 1-2 tablet (2 tablets Oral Given 11/19/23 2239)  ondansetron (ZOFRAN) tablet 4 mg (has no administration in time range)    Or  ondansetron (ZOFRAN) injection 4 mg (has no administration in time range)  albuterol (PROVENTIL) (2.5 MG/3ML) 0.083% nebulizer solution 2.5 mg (has no administration in time range)  guaiFENesin (MUCINEX) 12 hr tablet 600 mg (600 mg Oral Given 11/19/23 2239)  carbidopa-levodopa (SINEMET IR) 25-100 MG per tablet immediate release 3 tablet (has no  administration in time range)  oseltamivir (TAMIFLU) capsule 75 mg (75 mg Oral Given 11/19/23 2240)  cefTRIAXone (ROCEPHIN) 1 g in sodium chloride 0.9 % 100 mL IVPB (0 g Intravenous Stopped 11/19/23 1903)  azithromycin (ZITHROMAX) 500 mg in sodium chloride 0.9 % 250 mL IVPB (0 mg Intravenous Stopped 11/19/23 1903)    ED Course/ Medical Decision Making/ A&P Clinical Course  as of 11/20/23 0001  Tue Nov 19, 2023  1648 CT head 05/29/22 per chart review:"IMPRESSION: No acute intracranial process." [TY]  1800 DG Chest 1 View IMPRESSION: 1. Patchy right lower lobe airspace disease consistent with bronchopneumonia given clinical presentation.   [TY]  1800 CT Head Wo Contrast IMPRESSION: 1. No CT evidence for acute intracranial abnormality. 2. Atrophy and chronic small vessel ischemic changes of the white matter.   [TY]  1800 WBC(!): 13.7 CXR with PNA; antibiotics ordered.  [TY]    Clinical Course User Index [TY] Coral Spikes, DO   {   Click here for ABCD2, HEART and other calculatorsREFRESH Note before signing :1}                              Medical Decision Making This is a 73 year old male with history of dementia secondary to his Parkinson's presented with altered mental status/delirium.  He is afebrile nontachycardic, slightly tachypneic.  Coarse breath sounds throughout.  Chest x-ray with pneumonia.  Has elevated white blood cell count.  Lactate not elevated.  Cover with antibiotics.  Low suspicion for acute intracranial pathology as he had no localizing deficits on exam, was following commands for me.  Per wife does have some waxing waning mentation at baseline.  Given patient's altered mental status with pneumonia will admit for antibiotics.  Amount and/or Complexity of Data Reviewed Labs: ordered. Decision-making details documented in ED Course. Radiology: ordered. Decision-making details documented in ED Course.  Risk Decision regarding hospitalization.   ***  {Document  critical care time when appropriate:1} {Document review of labs and clinical decision tools ie heart score, Chads2Vasc2 etc:1}  {Document your independent review of radiology images, and any outside records:1} {Document your discussion with family members, caretakers, and with consultants:1} {Document social determinants of health affecting pt's care:1} {Document your decision making why or why not admission, treatments were needed:1} Final Clinical Impression(s) / ED Diagnoses Final diagnoses:  None    Rx / DC Orders ED Discharge Orders     None

## 2023-11-20 NOTE — Progress Notes (Signed)
[facility]                                                                               Zachary [name], is a 73 y.o. male, DOB - September 09, 1951, UJW:[id number] Admit date - 11/19/2023    Outpatient Primary MD for the patient is [name], MD  LOS - 0  days    Brief summary    73 year old gentleman with prior history of Parkinson disease mostly wheelchair-bound, orthostatic hypotension, BPH, asthma, presented with cough and congestion and altered mental status.   chest x-ray shows patchy right lower lobe airspace disease consistent with bronchopneumonia  Respiratory panel is positive for influenza A  Assessment & Plan    Assessment and Plan:   Right lower lobe pneumonia Start the patient on IV Rocephin and azithromycin Follow blood cultures and sputum cultures Obtain streptococcal pneumonia and Deetjen and Legionella antigen. Nasal cannula oxygen to keep sats greater than 90%   Influenza A infection patient on Tamiflu continue the same   Hypokalemia Replaced    History of Parkinson's disease Continue the same medications     RN Pressure Injury Documentation:    Malnutrition Type:  Nutrition Problem: Severe Malnutrition Etiology: chronic illness (Parkinson's)   Malnutrition Characteristics:  Signs/Symptoms: moderate fat depletion, severe muscle depletion, percent weight loss   Nutrition Interventions:  Interventions: Ensure Enlive (each supplement provides 350kcal and 20 grams of protein), MVI  Estimated body mass index is 24.51 kg/m as calculated from the following:   Height as of 08/08/23: 5\' 9"  (1.753 m).   Weight as of 08/08/23: 75.3 kg.  Code Status: DN limited.  DVT Prophylaxis:  SCDs Start: 11/19/23 2207   Level of Care: Level of care: Med-Surg Family Communication: discussed with wife over the phone.   Disposition Plan:     Remains inpatient appropriate:  IV antibiotics.   Procedures:  None.   Consultants:    None.   Antimicrobials:   Anti-infectives (From admission, onward)    Start     Dose/Rate Route Frequency Ordered Stop   11/20/23 1800  azithromycin (ZITHROMAX) 500 mg in sodium chloride 0.9 % 250 mL IVPB        500 mg 250 mL/hr over 60 Minutes Intravenous Every 24 hours 11/19/23 2034 11/25/23 1759   11/20/23 1000  cefTRIAXone (ROCEPHIN) 2 g in sodium chloride 0.9 % 100 mL IVPB        2 g 200 mL/hr over 30 Minutes Intravenous Every 24 hours 11/19/23 2034 11/25/23 0959   11/19/23 2315  oseltamivir (TAMIFLU) capsule 75 mg        75 mg Oral 2 times daily 11/19/23 2223 11/24/23 2159   11/19/23 1800  cefTRIAXone (ROCEPHIN) 1 g in sodium chloride 0.9 % 100 mL IVPB        1 g 200 mL/hr over 30 Minutes Intravenous  Once 11/19/23 1750 11/19/23 1903   11/19/23 1800  azithromycin (ZITHROMAX) 500 mg in sodium chloride 0.9 % 250 mL IVPB        500 mg 250 mL/hr over 60 Minutes Intravenous  Once 11/19/23 1750 11/19/23 1903        Medications  Scheduled  Meds:  carbidopa-levodopa  1 tablet Oral QHS   carbidopa-levodopa  2 tablet Oral 6 times per day   carbidopa-levodopa  3 tablet Oral Daily   clonazepam  0.25 mg Oral BID   feeding supplement  237 mL Oral BID BM   finasteride  5 mg Oral Q2000   fludrocortisone  0.1 mg Oral Daily   guaiFENesin  600 mg Oral BID   midodrine  5-10 mg Oral TID WC   multivitamin with minerals  1 tablet Oral Daily   oseltamivir  75 mg Oral BID   Continuous Infusions:  sodium chloride 75 mL/hr at 11/20/23 0944   azithromycin     cefTRIAXone (ROCEPHIN)  IV 2 g (11/20/23 1122)   PRN Meds:.acetaminophen **OR** acetaminophen, albuterol, dextromethorphan-guaiFENesin, HYDROcodone-acetaminophen, ondansetron **OR** ondansetron (ZOFRAN) IV    Subjective:   Zachary [name] was seen and examined today.  He appears confused, not back to baseline as per the caregiver at bedside.   Objective:   Vitals:   11/19/23 2226 11/20/23 0343 11/20/23 0642 11/20/23 1029  BP:  139/79 139/65 (!) 145/86 (!) 146/80  Pulse: 70 66 70 64  Resp:  18 18   Temp: 99.4 F (37.4 C) 99.9 F (37.7 C) 98.7 F (37.1 C) 99.4 F (37.4 C)  TempSrc: Axillary Oral Oral Oral  SpO2: 99% 98% 100% 98%    Intake/Output Summary (Last 24 hours) at 11/20/2023 1746 Last data filed at 11/20/2023 1300 Gross per 24 hour  Intake 20 ml  Output 250 ml  Net -230 ml   There were no vitals filed for this visit.   Exam General exam: Appears calm and comfortable  Respiratory system: Clear to auscultation. Respiratory effort normal. Cardiovascular system: S1 & S2 heard, RRR.  Gastrointestinal system: Abdomen is nondistended, soft and nontender.  Central nervous system: Alert , but confused.  Extremities: Symmetric 5 x 5 power. Skin: No rashes,     Data Reviewed:  I have personally reviewed following labs and imaging studies   CBC Lab Results  Component Value Date   WBC 12.2 (H) 11/20/2023   RBC 3.49 (L) 11/20/2023   HGB 11.2 (L) 11/20/2023   HCT 33.8 (L) 11/20/2023   MCV 96.8 11/20/2023   MCH 32.1 11/20/2023   PLT 190 11/20/2023   MCHC 33.1 11/20/2023   RDW 12.1 11/20/2023   LYMPHSABS 0.7 11/19/2023   MONOABS 1.1 (H) 11/19/2023   EOSABS 0.0 11/19/2023   BASOSABS 0.0 11/19/2023     Last metabolic panel Lab Results  Component Value Date   NA 138 11/20/2023   K 3.1 (L) 11/20/2023   CL 102 11/20/2023   CO2 29 11/20/2023   BUN 20 11/20/2023   CREATININE 0.47 (L) 11/20/2023   GLUCOSE 102 (H) 11/20/2023   GFRNONAA >60 11/20/2023   GFRAA >90 09/15/2012   CALCIUM 8.3 (L) 11/20/2023   PHOS 2.5 11/20/2023   PROT 5.8 (L) 11/20/2023   ALBUMIN 3.1 (L) 11/20/2023   BILITOT 1.0 11/20/2023   ALKPHOS 43 11/20/2023   AST 11 (L) 11/20/2023   ALT 6 11/20/2023   ANIONGAP 7 11/20/2023    CBG (last 3)  No results for input(s): "GLUCAP" in the last 72 hours.    Coagulation Profile: No results for input(s): "INR", "PROTIME" in the last 168 hours.   Radiology Studies: DG  Chest 1 View Result Date: 11/19/2023 CLINICAL DATA:  Cough and congestion for 1 week EXAM: CHEST  1 VIEW COMPARISON:  06/05/2022 FINDINGS: Single frontal view of the chest  demonstrates stable enlargement of the cardiac silhouette. There is patchy consolidation within the right infrahilar region, consistent with right lower lobe bronchopneumonia given clinical presentation. No effusion or pneumothorax. No acute bony abnormalities. IMPRESSION: 1. Patchy right lower lobe airspace disease consistent with bronchopneumonia given clinical presentation. Electronically Signed   By: Sharlet Salina M.D.   On: 11/19/2023 17:57   CT Head Wo Contrast Result Date: 11/19/2023 CLINICAL DATA:  Mental status change EXAM: CT HEAD WITHOUT CONTRAST TECHNIQUE: Contiguous axial images were obtained from the base of the skull through the vertex without intravenous contrast. RADIATION DOSE REDUCTION: This exam was performed according to the departmental dose-optimization program which includes automated exposure control, adjustment of the mA and/or kV according to patient size and/or use of iterative reconstruction technique. COMPARISON:  CT brain 05/29/2022 FINDINGS: Brain: No acute territorial infarction, hemorrhage or intracranial mass. Mild to moderate atrophy which appears slightly progressed. Slight interval increase in ventricle size likely due to atrophy progression. Moderate periventricular white matter hypodensity consistent with chronic small vessel ischemic change Vascular: No hyperdense vessels.  No unexpected calcification Skull: Normal. Negative for fracture or focal lesion. Sinuses/Orbits: No acute finding. Other: None IMPRESSION: 1. No CT evidence for acute intracranial abnormality. 2. Atrophy and chronic small vessel ischemic changes of the white matter. Electronically Signed   By: Jasmine Pang M.D.   On: 11/19/2023 17:54       Kathlen Mody M.D. [facility] 11/20/2023, 5:46 PM  Available via Epic secure  chat 7am-7pm After 7 pm, please refer to night coverage provider listed on amion.

## 2023-11-20 NOTE — Progress Notes (Signed)
Initial Nutrition Assessment  DOCUMENTATION CODES:   Severe malnutrition in context of chronic illness  INTERVENTION:   -Ensure Plus High Protein po BID, each supplement provides 350 kcal and 20 grams of protein. .   -Multivitamin with minerals daily  NUTRITION DIAGNOSIS:   Severe Malnutrition related to chronic illness (Parkinson's) as evidenced by moderate fat depletion, severe muscle depletion, percent weight loss.  GOAL:   Patient will meet greater than or equal to 90% of their needs  MONITOR:   PO intake, Supplement acceptance  REASON FOR ASSESSMENT:   Consult Assessment of nutrition requirement/status  ASSESSMENT:   73 y.o. male with medical history significant of Parkinson disease , seasonal allergies, asthma, BPH, orthostatic hypotension, history of syncope, anemia. Presented with  cough and confusion. Admitted for CAP, flu+.  Patient in room, family had just left room at time of visit. Pt with AMS, answered some questions but was unable to elaborate.  Pt states "I eat" and "minimal". Was agreeable to drinking Ensure supplements. States he likes to eat "eggs and bacon".  Will place on daily MVI as well.   Weighed patient in bed, weight: 153 lbs. Per weight records, pt has lost 12 lbs since 08/08/23 (7% wt loss x 3.5 months, significant for time frame).  Medications: Sinemet, Multivitamin with minerals daily, KCl  Labs reviewed: Low K  Low iron Low Vitamin B-12 Flu+  NUTRITION - FOCUSED PHYSICAL EXAM:  Flowsheet Row Most Recent Value  Orbital Region Moderate depletion  Upper Arm Region Severe depletion  Thoracic and Lumbar Region Unable to assess  Buccal Region Moderate depletion  Temple Region Moderate depletion  Clavicle Bone Region Severe depletion  Clavicle and Acromion Bone Region Severe depletion  Scapular Bone Region Unable to assess  Dorsal Hand Severe depletion  Patellar Region Severe depletion  Anterior Thigh Region Severe depletion   Posterior Calf Region Severe depletion  Edema (RD Assessment) None  Hair Reviewed  Eyes Reviewed  Mouth Reviewed  Skin Reviewed  Nails Reviewed       Diet Order:   Diet Order             Diet regular Room service appropriate? Yes; Fluid consistency: Thin  Diet effective now                   EDUCATION NEEDS:   Not appropriate for education at this time  Skin:  Skin Assessment: Reviewed RN Assessment  Last BM:  PTA  Height:   Ht Readings from Last 1 Encounters:  08/08/23 5\' 9"  (1.753 m)    Weight:   Wt Readings from Last 1 Encounters:  08/08/23 75.3 kg  11/20/23: 153 lbs  BMI: 22.6 kg/m^2  Estimated Nutritional Needs:   Kcal:  1700-1900  Protein:  80-90g  Fluid:  1.7L/day  Tilda Franco, MS, RD, LDN Inpatient Clinical Dietitian Contact via Secure chat

## 2023-11-21 ENCOUNTER — Inpatient Hospital Stay (HOSPITAL_COMMUNITY): Payer: Medicare PPO

## 2023-11-21 DIAGNOSIS — R011 Cardiac murmur, unspecified: Secondary | ICD-10-CM

## 2023-11-21 DIAGNOSIS — J09X1 Influenza due to identified novel influenza A virus with pneumonia: Secondary | ICD-10-CM | POA: Diagnosis not present

## 2023-11-21 DIAGNOSIS — E876 Hypokalemia: Secondary | ICD-10-CM | POA: Diagnosis not present

## 2023-11-21 DIAGNOSIS — J189 Pneumonia, unspecified organism: Secondary | ICD-10-CM | POA: Diagnosis not present

## 2023-11-21 DIAGNOSIS — G20B1 Parkinson's disease with dyskinesia, without mention of fluctuations: Secondary | ICD-10-CM | POA: Diagnosis not present

## 2023-11-21 LAB — LEGIONELLA PNEUMOPHILA SEROGP 1 UR AG: L. pneumophila Serogp 1 Ur Ag: NEGATIVE

## 2023-11-21 LAB — BASIC METABOLIC PANEL
Anion gap: 8 (ref 5–15)
Anion gap: 10 (ref 5–15)
BUN: 14 mg/dL (ref 8–23)
BUN: 15 mg/dL (ref 8–23)
CO2: 27 mmol/L (ref 22–32)
CO2: 26 mmol/L (ref 22–32)
Calcium: 8.1 mg/dL — ABNORMAL LOW (ref 8.9–10.3)
Calcium: 8 mg/dL — ABNORMAL LOW (ref 8.9–10.3)
Chloride: 102 mmol/L (ref 98–111)
Chloride: 102 mmol/L (ref 98–111)
Creatinine, Ser: <0.3 mg/dL — ABNORMAL LOW (ref 0.61–1.24)
Creatinine, Ser: 0.56 mg/dL — ABNORMAL LOW (ref 0.61–1.24)
GFR, Estimated: >60 mL/min (ref >60)
Glucose, Bld: 92 mg/dL (ref 70–99)
Glucose, Bld: 129 mg/dL — ABNORMAL HIGH (ref 70–99)
Potassium: 3 mmol/L — ABNORMAL LOW (ref 3.5–5.1)
Potassium: 3.1 mmol/L — ABNORMAL LOW (ref 3.5–5.1)
Sodium: 137 mmol/L (ref 135–145)
Sodium: 138 mmol/L (ref 135–145)

## 2023-11-21 LAB — CBC WITH DIFFERENTIAL/PLATELET
Abs Immature Granulocytes: 0.04 10*3/uL (ref 0.00–0.07)
Basophils Absolute: 0 10*3/uL (ref 0.0–0.1)
Basophils Relative: 0 %
Eosinophils Absolute: 0 10*3/uL (ref 0.0–0.5)
Eosinophils Relative: 0 %
HCT: 31.6 % — ABNORMAL LOW (ref 39.0–52.0)
Hemoglobin: 10.3 g/dL — ABNORMAL LOW (ref 13.0–17.0)
Immature Granulocytes: 1 %
Lymphocytes Relative: 14 %
Lymphs Abs: 1.2 10*3/uL (ref 0.7–4.0)
MCH: 32.2 pg (ref 26.0–34.0)
MCHC: 32.6 g/dL (ref 30.0–36.0)
MCV: 98.8 fL (ref 80.0–100.0)
Monocytes Absolute: 0.7 10*3/uL (ref 0.1–1.0)
Monocytes Relative: 9 %
Neutro Abs: 6.2 10*3/uL (ref 1.7–7.7)
Neutrophils Relative %: 76 %
Platelets: 189 10*3/uL (ref 150–400)
RBC: 3.2 MIL/uL — ABNORMAL LOW (ref 4.22–5.81)
RDW: 12.1 % (ref 11.5–15.5)
WBC: 8.1 10*3/uL (ref 4.0–10.5)
nRBC: 0 % (ref 0.0–0.2)

## 2023-11-21 LAB — ECHOCARDIOGRAM COMPLETE
AR max vel: 2.88 cm2
AV Area VTI: 2.87 cm2
AV Area mean vel: 2.82 cm2
AV Mean grad: 5 mm[Hg]
AV Peak grad: 9.6 mm[Hg]
Ao pk vel: 1.55 m/s
Area-P 1/2: 3.87 cm2
Calc EF: 66.5 %
MV VTI: 3.16 cm2
S' Lateral: 3.3 cm
Single Plane A2C EF: 72.7 %
Single Plane A4C EF: 59.5 %

## 2023-11-21 LAB — MAGNESIUM: Magnesium: 2 mg/dL (ref 1.7–2.4)

## 2023-11-21 MED ORDER — POTASSIUM CHLORIDE CRYS ER 20 MEQ PO TBCR
40.0000 meq | EXTENDED_RELEASE_TABLET | Freq: Two times a day (BID) | ORAL | Status: AC
  Administered 2023-11-21 (×2): 40 meq via ORAL
  Filled 2023-11-21 (×2): qty 2

## 2023-11-21 MED ORDER — BENZONATATE 100 MG PO CAPS: 100.0000 mg | ORAL_CAPSULE | Freq: Three times a day (TID) | ORAL | Status: DC | PRN

## 2023-11-21 NOTE — Progress Notes (Signed)
[facility]                                                                               Zachary [name], is a 73 y.o. male, DOB - [birth year], ZOX:[id number] Admit date - 11/19/2023    Outpatient Primary MD for the patient is [name], MD  LOS - 1  days    Brief summary    73 year old gentleman with prior history of Parkinson disease mostly wheelchair-bound, orthostatic hypotension, BPH, asthma, presented with cough and congestion and altered mental status.   chest x-ray shows patchy right lower lobe airspace disease consistent with bronchopneumonia  Respiratory panel is positive for influenza A  Assessment & Plan    Assessment and Plan:   Right lower lobe pneumonia Start the patient on IV Rocephin and azithromycin Follow blood cultures and sputum cultures Streptococcal antigen is negative. Legionella is pending.  Nasal cannula oxygen to keep sats greater than 90% Wbc count normalized.   Influenza A infection patient on Tamiflu continue the same   Hypokalemia Replaced, repeat in am.     History of Parkinson's disease Continue the same medications  Mild normocytic anemia Hemoglobin around 10. Recommend iron supplementation on discharge.    Some coughing while eating. SLP evaluation ordered.      RN Pressure Injury Documentation:    Malnutrition Type:  Nutrition Problem: Severe Malnutrition Etiology: chronic illness (Parkinson's)   Malnutrition Characteristics:  Signs/Symptoms: moderate fat depletion, severe muscle depletion, percent weight loss   Nutrition Interventions:  Interventions: Ensure Enlive (each supplement provides 350kcal and 20 grams of protein), MVI  Estimated body mass index is 24.51 kg/m as calculated from the following:   Height as of 08/08/23: 5\' 9"  (1.753 m).   Weight as of 08/08/23: 75.3 kg.  Code Status: DN limited.  DVT Prophylaxis:  SCDs Start: 11/19/23 2207   Level of Care: Level of care:  Med-Surg Family Communication: discussed with wife over the phone.   Disposition Plan:     Remains inpatient appropriate:  IV antibiotics.   Procedures:  None.   Consultants:   None.   Antimicrobials:   Anti-infectives (From admission, onward)    Start     Dose/Rate Route Frequency Ordered Stop   11/20/23 1800  azithromycin (ZITHROMAX) 500 mg in sodium chloride 0.9 % 250 mL IVPB        500 mg 250 mL/hr over 60 Minutes Intravenous Every 24 hours 11/19/23 2034 11/25/23 1759   11/20/23 1000  cefTRIAXone (ROCEPHIN) 2 g in sodium chloride 0.9 % 100 mL IVPB        2 g 200 mL/hr over 30 Minutes Intravenous Every 24 hours 11/19/23 2034 11/25/23 0959   11/19/23 2315  oseltamivir (TAMIFLU) capsule 75 mg        75 mg Oral 2 times daily 11/19/23 2223 11/24/23 2159   11/19/23 1800  cefTRIAXone (ROCEPHIN) 1 g in sodium chloride 0.9 % 100 mL IVPB        1 g 200 mL/hr over 30 Minutes Intravenous  Once 11/19/23 1750 11/19/23 1903   11/19/23 1800  azithromycin (ZITHROMAX) 500 mg in sodium chloride 0.9 % 250 mL IVPB  500 mg 250 mL/hr over 60 Minutes Intravenous  Once 11/19/23 1750 11/19/23 1903        Medications  Scheduled Meds:  carbidopa-levodopa  1 tablet Oral QHS   carbidopa-levodopa  2 tablet Oral 6 times per day   carbidopa-levodopa  3 tablet Oral Daily   clonazepam  0.25 mg Oral BID   feeding supplement  237 mL Oral BID BM   finasteride  5 mg Oral Q2000   fludrocortisone  0.1 mg Oral Daily   guaiFENesin  600 mg Oral BID   midodrine  5-10 mg Oral TID WC   multivitamin with minerals  1 tablet Oral Daily   oseltamivir  75 mg Oral BID   potassium chloride  40 mEq Oral BID   Continuous Infusions:  azithromycin 500 mg (11/20/23 1843)   cefTRIAXone (ROCEPHIN)  IV 2 g (11/21/23 0915)   PRN Meds:.acetaminophen **OR** acetaminophen, albuterol, dextromethorphan-guaiFENesin, HYDROcodone-acetaminophen, ondansetron **OR** ondansetron (ZOFRAN) IV    Subjective:   [name]  was seen and examined today.  He is more awake and answering questions.  Wife at bedside.   Objective:   Vitals:   11/20/23 2024 11/21/23 0502 11/21/23 1223 11/21/23 1244  BP: 108/82 (!) 159/98  139/86  Pulse: 76 64 84 67  Resp: 18 18 18 16   Temp: (!) 97 F (36.1 C) 97.9 F (36.6 C)  98.4 F (36.9 C)  TempSrc:  Oral  Oral  SpO2: 99% 100% 96% 97%    Intake/Output Summary (Last 24 hours) at 11/21/2023 1440 Last data filed at 11/21/2023 0544 Gross per 24 hour  Intake 470 ml  Output 300 ml  Net 170 ml   There were no vitals filed for this visit.   Exam General exam: Appears calm and comfortable  Respiratory system: Clear to auscultation. Respiratory effort normal. Cardiovascular system: S1 & S2 heard, RRR.  Gastrointestinal system: Abdomen is nondistended, soft and nontender.  Central nervous system: Alert and oriented to self .  Extremities: no edema.  Skin: No rashes, Psychiatry: Mood & affect appropriate.       Data Reviewed:  I have personally reviewed following labs and imaging studies   CBC Lab Results  Component Value Date   WBC 8.1 11/21/2023   RBC 3.20 (L) 11/21/2023   HGB 10.3 (L) 11/21/2023   HCT 31.6 (L) 11/21/2023   MCV 98.8 11/21/2023   MCH 32.2 11/21/2023   PLT 189 11/21/2023   MCHC 32.6 11/21/2023   RDW 12.1 11/21/2023   LYMPHSABS 1.2 11/21/2023   MONOABS 0.7 11/21/2023   EOSABS 0.0 11/21/2023   BASOSABS 0.0 11/21/2023     Last metabolic panel Lab Results  Component Value Date   NA 138 11/21/2023   K 3.1 (L) 11/21/2023   CL 102 11/21/2023   CO2 26 11/21/2023   BUN 15 11/21/2023   CREATININE 0.56 (L) 11/21/2023   GLUCOSE 129 (H) 11/21/2023   GFRNONAA >60 11/21/2023   GFRAA >90 09/15/2012   CALCIUM 8.0 (L) 11/21/2023   PHOS 2.5 11/20/2023   PROT 5.8 (L) 11/20/2023   ALBUMIN 3.1 (L) 11/20/2023   BILITOT 1.0 11/20/2023   ALKPHOS 43 11/20/2023   AST 11 (L) 11/20/2023   ALT 6 11/20/2023   ANIONGAP 10 11/21/2023    CBG (last 3)   No results for input(s): "GLUCAP" in the last 72 hours.    Coagulation Profile: No results for input(s): "INR", "PROTIME" in the last 168 hours.   Radiology Studies: ECHOCARDIOGRAM COMPLETE Result Date: 11/21/2023  ECHOCARDIOGRAM REPORT   Patient Name:   ROGELIO MALOY Date of Exam: 11/21/2023 Medical Rec #:  [id number]      Height:       69.0 in Accession #:    [id number]     Weight:       166.0 lb Date of Birth:  December 07, 1950      BSA:          1.909 m Patient Age:    28 years       BP:           159/98 mmHg Patient Gender: M              HR:           67 bpm. Exam Location:  Inpatient Procedure: 2D Echo, Cardiac Doppler and Color Doppler Indications:    Murmur  History:        Patient has prior history of Echocardiogram examinations, most                 recent 05/30/2022. Signs/Symptoms:Syncope.  Sonographer:    Amy Chionchio Referring Phys: [id number] ANASTASSIA DOUTOVA IMPRESSIONS  1. Left ventricular ejection fraction, by estimation, is 60 to 65%. The left ventricle has normal function. The left ventricle has no regional wall motion abnormalities. There is mild concentric left ventricular hypertrophy. Left ventricular diastolic parameters were normal.  2. Right ventricular systolic function is normal. The right ventricular size is normal.  3. The mitral valve is normal in structure. No evidence of mitral valve regurgitation. No evidence of mitral stenosis.  4. The aortic valve is normal in structure. Aortic valve regurgitation is not visualized. No aortic stenosis is present.  5. The inferior vena cava is normal in size with greater than 50% respiratory variability, suggesting right atrial pressure of 3 mmHg. FINDINGS  Left Ventricle: Left ventricular ejection fraction, by estimation, is 60 to 65%. The left ventricle has normal function. The left ventricle has no regional wall motion abnormalities. The left ventricular internal cavity size was normal in size. There is  mild concentric left ventricular  hypertrophy. Left ventricular diastolic parameters were normal. Right Ventricle: The right ventricular size is normal. No increase in right ventricular wall thickness. Right ventricular systolic function is normal. Left Atrium: Left atrial size was normal in size. Right Atrium: Right atrial size was normal in size. Pericardium: There is no evidence of pericardial effusion. Presence of epicardial fat layer. Mitral Valve: The mitral valve is normal in structure. No evidence of mitral valve regurgitation. No evidence of mitral valve stenosis. MV peak gradient, 2.8 mmHg. The mean mitral valve gradient is 1.0 mmHg. Tricuspid Valve: The tricuspid valve is normal in structure. Tricuspid valve regurgitation is not demonstrated. No evidence of tricuspid stenosis. Aortic Valve: The aortic valve is normal in structure. Aortic valve regurgitation is not visualized. No aortic stenosis is present. Aortic valve mean gradient measures 5.0 mmHg. Aortic valve peak gradient measures 9.6 mmHg. Aortic valve area, by VTI measures 2.87 cm. Pulmonic Valve: The pulmonic valve was normal in structure. Pulmonic valve regurgitation is not visualized. No evidence of pulmonic stenosis. Aorta: The aortic root is normal in size and structure. Venous: The inferior vena cava is normal in size with greater than 50% respiratory variability, suggesting right atrial pressure of 3 mmHg. IAS/Shunts: No atrial level shunt detected by color flow Doppler.  LEFT VENTRICLE PLAX 2D LVIDd:         4.50 cm      Diastology LVIDs:  3.30 cm      LV e' medial:    8.02 cm/s LV PW:         1.00 cm      LV E/e' medial:  7.2 LV IVS:        1.10 cm      LV e' lateral:   9.51 cm/s LVOT diam:     2.10 cm      LV E/e' lateral: 6.0 LV SV:         80 LV SV Index:   42 LVOT Area:     3.46 cm  LV Volumes (MOD) LV vol d, MOD A2C: 126.0 ml LV vol d, MOD A4C: 91.3 ml LV vol s, MOD A2C: 34.4 ml LV vol s, MOD A4C: 37.0 ml LV SV MOD A2C:     91.6 ml LV SV MOD A4C:     91.3 ml  LV SV MOD BP:      74.0 ml RIGHT VENTRICLE             IVC RV Basal diam:  4.00 cm     IVC diam: 1.90 cm RV S prime:     14.20 cm/s TAPSE (M-mode): 2.1 cm LEFT ATRIUM           Index        RIGHT ATRIUM           Index LA Vol (A2C): 58.0 ml 30.39 ml/m  RA Area:     19.90 cm                                    RA Volume:   52.70 ml  27.61 ml/m  AORTIC VALVE                     PULMONIC VALVE AV Area (Vmax):    2.88 cm      PV Vmax:       0.89 m/s AV Area (Vmean):   2.82 cm      PV Peak grad:  3.2 mmHg AV Area (VTI):     2.87 cm AV Vmax:           155.00 cm/s AV Vmean:          107.000 cm/s AV VTI:            0.280 m AV Peak Grad:      9.6 mmHg AV Mean Grad:      5.0 mmHg LVOT Vmax:         129.00 cm/s LVOT Vmean:        87.100 cm/s LVOT VTI:          0.232 m LVOT/AV VTI ratio: 0.83  AORTA Ao Root diam: 3.20 cm Ao Asc diam:  3.40 cm MITRAL VALVE               TRICUSPID VALVE MV Area (PHT): 3.87 cm    TR Peak grad:   21.3 mmHg MV Area VTI:   3.16 cm    TR Vmax:        231.00 cm/s MV Peak grad:  2.8 mmHg MV Mean grad:  1.0 mmHg    SHUNTS MV Vmax:       0.84 m/s    Systemic VTI:  0.23 m MV Vmean:      52.9 cm/s   Systemic Diam: 2.10 cm MV Decel Time: 196 msec MV  E velocity: 57.40 cm/s MV A velocity: 60.70 cm/s MV E/A ratio:  0.95 Kardie Tobb DO Electronically signed by Thomasene Ripple DO Signature Date/Time: 11/21/2023/11:39:42 AM    Final    DG Chest 1 View Result Date: 11/19/2023 CLINICAL DATA:  Cough and congestion for 1 week EXAM: CHEST  1 VIEW COMPARISON:  06/05/2022 FINDINGS: Single frontal view of the chest demonstrates stable enlargement of the cardiac silhouette. There is patchy consolidation within the right infrahilar region, consistent with right lower lobe bronchopneumonia given clinical presentation. No effusion or pneumothorax. No acute bony abnormalities. IMPRESSION: 1. Patchy right lower lobe airspace disease consistent with bronchopneumonia given clinical presentation. Electronically Signed   By:  Sharlet Salina M.D.   On: 11/19/2023 17:57   CT Head Wo Contrast Result Date: 11/19/2023 CLINICAL DATA:  Mental status change EXAM: CT HEAD WITHOUT CONTRAST TECHNIQUE: Contiguous axial images were obtained from the base of the skull through the vertex without intravenous contrast. RADIATION DOSE REDUCTION: This exam was performed according to the departmental dose-optimization program which includes automated exposure control, adjustment of the mA and/or kV according to patient size and/or use of iterative reconstruction technique. COMPARISON:  CT brain 05/29/2022 FINDINGS: Brain: No acute territorial infarction, hemorrhage or intracranial mass. Mild to moderate atrophy which appears slightly progressed. Slight interval increase in ventricle size likely due to atrophy progression. Moderate periventricular white matter hypodensity consistent with chronic small vessel ischemic change Vascular: No hyperdense vessels.  No unexpected calcification Skull: Normal. Negative for fracture or focal lesion. Sinuses/Orbits: No acute finding. Other: None IMPRESSION: 1. No CT evidence for acute intracranial abnormality. 2. Atrophy and chronic small vessel ischemic changes of the white matter. Electronically Signed   By: Jasmine Pang M.D.   On: 11/19/2023 17:54       Kathlen Mody M.D. [facility] 11/21/2023, 2:40 PM  Available via Epic secure chat 7am-7pm After 7 pm, please refer to night coverage provider listed on amion.

## 2023-11-21 NOTE — Plan of Care (Signed)

## 2023-11-21 NOTE — Progress Notes (Signed)
RT called to nebulizer treatment- RT entered room PT eating lunch. PT does not appear to be in respiratory distress at this time. Sp02 on RA 96%, BBS Left clear- RT slight end ezp whz. PT states he is "breathing fine." RN aware.

## 2023-11-21 NOTE — TOC Progression Note (Signed)
Transition of Care [facility]) - Progression Note    Patient Details  Name: Zachary [name] MRN: [id number] Date of Birth: [birth year]  [facility] CM/SW Contact  Otelia Santee, LCSW Phone Number: 11/21/2023, 11:15 AM  Clinical Narrative:    Spoke with Arline Asp at [facility] and confirmed pt able to return to their facility at discharge. Will need to fax FL-2 and DC summary to [phone] for review prior to pt returning to facility.    Expected Discharge Plan: Assisted Living Barriers to Discharge: Continued Medical Work up  Expected Discharge Plan and Services In-house Referral: Clinical Social Work Discharge Planning Services: NA Post Acute Care Choice: Resumption of Svcs/PTA Provider Living arrangements for the past 2 months: Assisted Living Facility                 DME Arranged: N/A DME Agency: NA                   Social Determinants of Health (SDOH) Interventions SDOH Screenings   Food Insecurity: No Food Insecurity (11/19/2023)  Housing: Low Risk  (11/19/2023)  Transportation Needs: No Transportation Needs (11/19/2023)  Utilities: Not At Risk (11/19/2023)  Social Connections: Patient Unable To Answer (11/19/2023)  Tobacco Use: Low Risk  (11/19/2023)    Readmission Risk Interventions    05/30/2022    1:49 PM  Readmission Risk Prevention Plan  Post Dischage Appt Complete  Medication Screening Complete  Transportation Screening Complete

## 2023-11-21 NOTE — NC FL2 (Addendum)
[place] MEDICAID FL2 LEVEL OF CARE FORM     IDENTIFICATION  Patient Name: Zachary [name] Birthdate: 02-Aug-1951 Sex: male Admission Date (Current Location): 11/19/2023  [facility] and [place] Number:  Producer, television/film/video and Address:  [facility],  [address] [place], [place] [address]      Provider Number: [id number]  Attending Physician Name and Address:  Kathlen Mody, MD  Relative Name and Phone Number:  Zachary [name], Zachary [name] (Spouse)  [phone]    Current Level of Care: Hospital Recommended Level of Care: Assisted Living Facility Prior Approval Number:    Date Approved/Denied:   PASRR Number:    Discharge Plan: Other (Comment) (ALF)    Current Diagnoses: Patient Active Problem List   Diagnosis Date Noted   Protein-calorie malnutrition, severe 11/20/2023   Influenza A with pneumonia 11/19/2023   CAP (community acquired pneumonia) 05/29/2022   Hypokalemia 05/29/2022   Moderate protein malnutrition (HCC) 05/29/2022   Hypercarbia 05/29/2022   Hypochloremia 05/29/2022   Hypophosphatemia 05/29/2022   Normocytic anemia 05/29/2022   Syncope 01/01/2022   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   Frequent falls    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   Hearing loss 04/29/2019   Nocturia 04/15/2018   Constipation 04/03/2017   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   Parkinson's disease (HCC) 06/1999    Orientation RESPIRATION BLADDER Height & Weight     Self  Normal Incontinent Weight:   Height:     BEHAVIORAL SYMPTOMS/MOOD NEUROLOGICAL BOWEL NUTRITION STATUS      Incontinent Diet (See DC Summary)  AMBULATORY STATUS COMMUNICATION OF NEEDS Skin   Total Care Verbally Normal                       Personal Care Assistance Level of Assistance  Bathing, Feeding, Dressing Bathing Assistance: Maximum assistance Feeding assistance: Limited assistance Dressing Assistance: Maximum assistance      Functional Limitations Info  Sight, Hearing, Speech Sight Info: Impaired Hearing Info: Adequate Speech Info: Adequate    SPECIAL CARE FACTORS FREQUENCY  Speech therapy             Speech Therapy Frequency: 2-3x/wk      Contractures Contractures Info: Present    Additional Factors Info  Code Status, Allergies, Psychotropic Code Status Info: DNR Allergies Info: Pear, Tree Extract Psychotropic Info: See MAR         Current Medications (11/22/2023):  This is the current hospital active medication list Current Facility-Administered Medications  Medication Dose Route Frequency Provider Last Rate Last Admin   0.9 %  sodium chloride infusion   Intravenous Continuous Kathlen Mody, MD 75 mL/hr at 11/22/23 1220 New Bag at 11/22/23 1220   acetaminophen (TYLENOL) tablet 650 mg  650 mg Oral Q6H PRN Therisa Doyne, MD       Or   acetaminophen (TYLENOL) suppository 650 mg  650 mg Rectal Q6H PRN Doutova, Anastassia, MD       albuterol (PROVENTIL) (2.5 MG/3ML) 0.083% nebulizer solution 2.5 mg  2.5 mg Nebulization Q2H PRN Doutova, Anastassia, MD       azithromycin (ZITHROMAX) 500 mg in sodium chloride 0.9 % 250 mL IVPB  500 mg Intravenous Q24H Doutova, Anastassia, MD 250 mL/hr at 11/21/23 1831 500 mg at 11/21/23 1831   benzonatate (TESSALON) capsule 100 mg  100 mg Oral TID PRN Kathlen Mody, MD       carbidopa-levodopa (SINEMET CR) 50-200 MG per tablet controlled release 1 tablet  1 tablet Oral QHS Therisa Doyne, MD   1 tablet at 11/21/23 2141   carbidopa-levodopa (SINEMET IR) 25-100 MG per tablet immediate release 2 tablet  2 tablet Oral 6 times per day Therisa Doyne, MD   2 tablet at 11/22/23 1344   carbidopa-levodopa (SINEMET IR) 25-100 MG per tablet immediate release 3 tablet  3 tablet Oral Daily Therisa Doyne, MD   3 tablet at 11/22/23 0559   cefTRIAXone (ROCEPHIN) 2 g in sodium chloride 0.9 % 100 mL IVPB  2 g Intravenous Q24H Therisa Doyne, MD 200 mL/hr at  11/22/23 0809 2 g at 11/22/23 0809   clonazepam (KLONOPIN) disintegrating tablet 0.25 mg  0.25 mg Oral BID Kathlen Mody, MD   0.25 mg at 11/22/23 0800   dextromethorphan-guaiFENesin (MUCINEX DM) 30-600 MG per 12 hr tablet 1 tablet  1 tablet Oral BID PRN Otho Bellows, RPH       feeding supplement (ENSURE ENLIVE / ENSURE PLUS) liquid 237 mL  237 mL Oral BID BM Kathlen Mody, MD   237 mL at 11/22/23 1215   finasteride (PROSCAR) tablet 5 mg  5 mg Oral Q2000 Kathlen Mody, MD   5 mg at 11/21/23 2140   fludrocortisone (FLORINEF) tablet 0.1 mg  0.1 mg Oral Daily Doutova, Anastassia, MD   0.1 mg at 11/22/23 0800   guaiFENesin (MUCINEX) 12 hr tablet 600 mg  600 mg Oral BID Therisa Doyne, MD   600 mg at 11/22/23 0800   HYDROcodone-acetaminophen (NORCO/VICODIN) 5-325 MG per tablet 1-2 tablet  1-2 tablet Oral Q4H PRN Therisa Doyne, MD       midodrine (PROAMATINE) tablet 5-10 mg  5-10 mg Oral TID WC Therisa Doyne, MD   10 mg at 11/22/23 1215   multivitamin with minerals tablet 1 tablet  1 tablet Oral Daily Kathlen Mody, MD   1 tablet at 11/22/23 0800   nystatin (MYCOSTATIN) 100000 UNIT/ML suspension 500,000 Units  5 mL Oral QID Kathlen Mody, MD   500,000 Units at 11/22/23 1344   ondansetron (ZOFRAN) tablet 4 mg  4 mg Oral Q6H PRN Therisa Doyne, MD       Or   ondansetron (ZOFRAN) injection 4 mg  4 mg Intravenous Q6H PRN Doutova, Anastassia, MD       oseltamivir (TAMIFLU) capsule 75 mg  75 mg Oral BID Therisa Doyne, MD   75 mg at 11/22/23 0801   polyethylene glycol (MIRALAX / GLYCOLAX) packet 17 g  17 g Oral Daily Kathlen Mody, MD   17 g at 11/22/23 1214   potassium chloride SA (KLOR-CON M) CR tablet 40 mEq  40 mEq Oral BID Kathlen Mody, MD   40 mEq at 11/22/23 1344   senna-docusate (Senokot-S) tablet 2 tablet  2 tablet Oral BID Kathlen Mody, MD   2 tablet at 11/22/23 1215     Discharge Medications: STOP taking these medications     AMBULATORY NON FORMULARY MEDICATION     divalproex 125 MG capsule Commonly known as: Depakote Sprinkles    potassium chloride SA 20 MEQ tablet Commonly known as: KLOR-CON M           TAKE these medications     albuterol 108 (90 Base) MCG/ACT inhaler Commonly known as: VENTOLIN HFA Inhale 2 puffs into the lungs every 6 (six) hours as needed for wheezing.    AMBULATORY NON FORMULARY MEDICATION Transport chair Dx: Parkinsons Disease G20    amoxicillin-clavulanate 875-125 MG tablet Commonly known as: AUGMENTIN Take 1 tablet by mouth 2 (  two) times daily for 4 days. Start taking on: November 25, 2023    benzonatate 100 MG capsule Commonly known as: TESSALON Take 1 capsule (100 mg total) by mouth 3 (three) times daily as needed (refractory cough).    carbidopa-levodopa 25-100 MG tablet Commonly known as: SINEMET IR 3 tabs AT 6:30AM/2 at 8:30AM/2 at 10:30AM/2 at 12:30PM/2 at 2:30PM/2 at 4:30pm/2 at 6:30pm. take up to extra tablet (in 1/2 tabs) per day AS NEEDED What changed: See the new instructions.    carbidopa-levodopa 50-200 MG tablet Commonly known as: SINEMET CR TAKE ONE TABLET BY MOUTH AT BEDTIME What changed: Another medication with the same name was changed. Make sure you understand how and when to take each.    clonazePAM 0.5 MG tablet Commonly known as: KLONOPIN Take 0.25 mg by mouth See admin instructions. Take 0.25 mg by mouth at 6:30 AM & 6:30 PM AND 0.25 mg every 12 hours as needed for severe anxiety or agitation    cyanocobalamin 1000 MCG tablet Take 1 tablet (1,000 mcg total) by mouth daily. Start taking on: November 25, 2023    feeding supplement Liqd Take 237 mLs by mouth 2 (two) times daily between meals.    finasteride 5 MG tablet Commonly known as: PROSCAR Take 5 mg by mouth daily at 8 pm.    fludrocortisone 0.1 MG tablet Commonly known as: FLORINEF Take 1 tablet (0.1 mg total) by mouth 2 (two) times daily. What changed: how much to take    midodrine 5 MG tablet Commonly known as:  PROAMATINE TAKE (2) TABLETS BY MOUTH TWICE DAILY AT 8AM & 12PM. TAKE (1) TABLET BY MOUTH AT DINNER. What changed:  how much to take how to take this when to take this additional instructions    Mucinex DM 30-600 MG Tb12 Take 1 tablet by mouth every 12 (twelve) hours as needed (for congestion or coughing).    multivitamin with minerals Tabs tablet Take 1 tablet by mouth daily. Start taking on: November 25, 2023    potassium chloride 20 MEQ packet Commonly known as: KLOR-CON Take 40 mEq by mouth daily.    senna-docusate 8.6-50 MG tablet Commonly known as: Senokot-S Take 2 tablets by mouth at bedtime as needed for mild constipation.            Relevant Imaging Results:  Relevant Lab Results:   Additional Information SSN: [id number]  Otelia Santee, LCSW

## 2023-11-22 ENCOUNTER — Inpatient Hospital Stay (HOSPITAL_COMMUNITY): Payer: Medicare PPO

## 2023-11-22 DIAGNOSIS — E43 Unspecified severe protein-calorie malnutrition: Secondary | ICD-10-CM

## 2023-11-22 DIAGNOSIS — E876 Hypokalemia: Secondary | ICD-10-CM | POA: Diagnosis not present

## 2023-11-22 DIAGNOSIS — G20B1 Parkinson's disease with dyskinesia, without mention of fluctuations: Secondary | ICD-10-CM | POA: Diagnosis not present

## 2023-11-22 DIAGNOSIS — J189 Pneumonia, unspecified organism: Secondary | ICD-10-CM | POA: Diagnosis not present

## 2023-11-22 DIAGNOSIS — J09X1 Influenza due to identified novel influenza A virus with pneumonia: Secondary | ICD-10-CM | POA: Diagnosis not present

## 2023-11-22 LAB — CBC WITH DIFFERENTIAL/PLATELET
Abs Immature Granulocytes: 0.03 10*3/uL (ref 0.00–0.07)
Basophils Absolute: 0 10*3/uL (ref 0.0–0.1)
Basophils Relative: 0 %
Eosinophils Absolute: 0 10*3/uL (ref 0.0–0.5)
Eosinophils Relative: 0 %
HCT: 32.1 % — ABNORMAL LOW (ref 39.0–52.0)
Hemoglobin: 10.5 g/dL — ABNORMAL LOW (ref 13.0–17.0)
Immature Granulocytes: 0 %
Lymphocytes Relative: 13 %
Lymphs Abs: 0.9 10*3/uL (ref 0.7–4.0)
MCH: 31.4 pg (ref 26.0–34.0)
MCHC: 32.7 g/dL (ref 30.0–36.0)
MCV: 96.1 fL (ref 80.0–100.0)
Monocytes Absolute: 0.6 10*3/uL (ref 0.1–1.0)
Monocytes Relative: 8 %
Neutro Abs: 5.3 10*3/uL (ref 1.7–7.7)
Neutrophils Relative %: 79 %
Platelets: 226 10*3/uL (ref 150–400)
RBC: 3.34 MIL/uL — ABNORMAL LOW (ref 4.22–5.81)
RDW: 11.9 % (ref 11.5–15.5)
WBC: 6.9 10*3/uL (ref 4.0–10.5)
nRBC: 0 % (ref 0.0–0.2)

## 2023-11-22 LAB — BASIC METABOLIC PANEL
Anion gap: 9 (ref 5–15)
BUN: 12 mg/dL (ref 8–23)
CO2: 27 mmol/L (ref 22–32)
Calcium: 8.1 mg/dL — ABNORMAL LOW (ref 8.9–10.3)
Chloride: 103 mmol/L (ref 98–111)
Creatinine, Ser: 0.52 mg/dL — ABNORMAL LOW (ref 0.61–1.24)
GFR, Estimated: >60 mL/min (ref >60)
Glucose, Bld: 96 mg/dL (ref 70–99)
Potassium: 3.2 mmol/L — ABNORMAL LOW (ref 3.5–5.1)
Sodium: 139 mmol/L (ref 135–145)

## 2023-11-22 LAB — RESPIRATORY PANEL BY PCR

## 2023-11-22 MED ORDER — SENNOSIDES-DOCUSATE SODIUM 8.6-50 MG PO TABS
2.0000 | ORAL_TABLET | Freq: Two times a day (BID) | ORAL | Status: DC
  Administered 2023-11-22 – 2023-11-23 (×3): 2 via ORAL
  Filled 2023-11-22 (×4): qty 2

## 2023-11-22 MED ORDER — NYSTATIN 100000 UNIT/ML MT SUSP
5.0000 mL | Freq: Four times a day (QID) | OROMUCOSAL | Status: DC
  Administered 2023-11-22 – 2023-11-24 (×9): 500000 [IU] via ORAL
  Filled 2023-11-22 (×10): qty 5

## 2023-11-22 MED ORDER — FLUCONAZOLE 200 MG PO TABS
200.0000 mg | ORAL_TABLET | Freq: Once | ORAL | Status: AC
Start: 2023-11-22 — End: 2023-11-22
  Administered 2023-11-22: 200 mg via ORAL
  Filled 2023-11-22: qty 1

## 2023-11-22 MED ORDER — SODIUM CHLORIDE 0.9 % IV SOLN: INTRAVENOUS | Status: DC

## 2023-11-22 MED ORDER — POLYETHYLENE GLYCOL 3350 17 G PO PACK
17.0000 g | PACK | Freq: Every day | ORAL | Status: DC
  Administered 2023-11-22 – 2023-11-24 (×3): 17 g via ORAL
  Filled 2023-11-22 (×3): qty 1

## 2023-11-22 MED ORDER — POTASSIUM CHLORIDE CRYS ER 20 MEQ PO TBCR
40.0000 meq | EXTENDED_RELEASE_TABLET | Freq: Two times a day (BID) | ORAL | Status: AC
  Administered 2023-11-22 (×2): 40 meq via ORAL
  Filled 2023-11-22 (×2): qty 2

## 2023-11-22 MED ORDER — AZITHROMYCIN 250 MG PO TABS
500.0000 mg | ORAL_TABLET | Freq: Every day | ORAL | Status: AC
  Administered 2023-11-22 – 2023-11-23 (×2): 500 mg via ORAL
  Filled 2023-11-22 (×2): qty 2

## 2023-11-22 NOTE — TOC Progression Note (Signed)
Transition of Care [facility]) - Progression Note    Patient Details  Name: Zachary [name] MRN: [id number] Date of Birth: 07-20-1951  [facility]) CM/SW Contact  Otelia Santee, LCSW Phone Number: 11/22/2023, 1:56 PM  Clinical Narrative:    Spoke with Oneita Hurt who shares that they have in-house HH w/ Tour manager. SLP added to pt's FL-2 for pt to receive HHSLP when returns to ALF.    Expected Discharge Plan: Assisted Living Barriers to Discharge: Continued Medical Work up  Expected Discharge Plan and Services In-house Referral: Clinical Social Work Discharge Planning Services: NA Post Acute Care Choice: Resumption of Svcs/PTA Provider Living arrangements for the past 2 months: Assisted Living Facility                 DME Arranged: N/A DME Agency: NA                   Social Determinants of Health (SDOH) Interventions SDOH Screenings   Food Insecurity: No Food Insecurity (11/19/2023)  Housing: Low Risk  (11/19/2023)  Transportation Needs: No Transportation Needs (11/19/2023)  Utilities: Not At Risk (11/19/2023)  Social Connections: Patient Unable To Answer (11/19/2023)  Tobacco Use: Low Risk  (11/19/2023)    Readmission Risk Interventions    05/30/2022    1:49 PM  Readmission Risk Prevention Plan  Post Dischage Appt Complete  Medication Screening Complete  Transportation Screening Complete

## 2023-11-22 NOTE — Evaluation (Signed)
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   chest x-ray shows patchy right lower lobe airspace disease consistent with bronchopneumonia,  Respiratory panel is positive for influenza A. CT head 11/20/2023 Atrophy and chronic small vessel ischemic changes of the white matter.  Wife reports pt has dysphagia - coughs with intake including solids and liquids.    Assessment / Plan / Recommendation  Clinical Impression  Patient with poor positioning in bed due to his contractures - however he is able to straighten his legs out per his wife.   Patient xerostomic and appears with white coating on lingual surface - concerning for yeast.  He was sleepy but willing to accept some po intake including [place] Ice, water and ice.  He demonstrates delayed swallow but no clinical indication of aspiration across all po intake.   He was too sleepy to consume solids - thus did not provide due to aspiration risk.   Wife reports pt will frequently nap during the day.   She states pt does cough with both food and liquids though he eats well and has a good appetite.  She reports pt has had "swallow evaluations" at his place of residence but she reports she does not know the results. Given pt has right low pna - and concern for prior event, Parkinson's and dysphagia - willl proceed with MBS to allow instrumental swallow evaluation.  Wife and MD agree to plan. RN checked bowel sounds and he is hyperactive.  Wife wanted pt to undergo swallow evaluation. SLP Visit Diagnosis: Dysphagia, oropharyngeal phase (R13.12)    Aspiration Risk  Severe aspiration risk;Risk for inadequate nutrition/hydration    Diet Recommendation Thin liquid    Liquid Administration via: Cup;Spoon;Other (Comment) (tip of straw) Supervision: Full supervision/cueing for compensatory strategies Compensations: Slow rate;Small sips/bites Postural Changes: Seated upright at 90 degrees;Remain upright for at least 30 minutes after po intake    Other  Recommendations Oral Care Recommendations: Oral care QID    Recommendations for follow up therapy are one component of a multi-disciplinary discharge planning process, led by the attending physician.  Recommendations may be updated based on patient status, additional functional criteria and insurance authorization.  Follow up Recommendations Home health SLP      Assistance Recommended at Discharge  FULL  Functional Status Assessment Patient has had a recent decline in their functional status and demonstrates the ability to make significant improvements in function in a reasonable and predictable amount of  time.  Frequency and Duration min 1 x/week  1 week       Prognosis Prognosis for improved oropharyngeal function: Fair Barriers to Reach Goals: Cognitive deficits      Swallow Study   General Date of Onset: 11/22/23 HPI: 73 year old gentleman with prior history of Parkinson disease mostly  wheelchair-bound, orthostatic hypotension, hearing loss, BPH, asthma, presented with cough and congestion and altered mental status.   chest x-ray shows patchy right lower lobe airspace disease consistent with bronchopneumonia,  Respiratory panel is positive for influenza A. CT head 11/20/2023 Atrophy and chronic small vessel ischemic changes of the white matter.  Wife reports pt has dysphagia - coughs with intake including solids and liquids. Type of Study: Bedside Swallow Evaluation Diet Prior to this Study: Regular;Thin liquids (Level 0) Temperature Spikes Noted: No Respiratory Status: Room air History of Recent Intubation: No Behavior/Cognition: Lethargic/Drowsy Oral Cavity Assessment: Other (comment) (white coating on tongue) Oral Care Completed by SLP: Yes (oral suction set up and oral care provided) Oral Cavity - Dentition: Adequate natural dentition Self-Feeding Abilities: Total assist Patient Positioning: Other (comment);Postural control interferes with function Baseline Vocal Quality: Low vocal intensity Volitional Cough: Weak Volitional Swallow: Unable to elicit    Oral/Motor/Sensory Function Overall Oral Motor/Sensory Function:  (wife reports pt has left sided weakness -  generalized weakness noted)   Ice Chips Ice chips: Within functional limits Presentation: Spoon   Thin Liquid Thin Liquid: Impaired Presentation: Cup;Self Fed;Spoon;Straw Oral Phase Impairments: Reduced lingual movement/coordination Pharyngeal  Phase Impairments: Suspected delayed Swallow    Nectar Thick Nectar Thick Liquid: Not tested   Honey Thick Honey Thick Liquid: Not tested   Puree Puree: Not tested   Solid     Solid: Not tested      Chales Abrahams 11/22/2023,1:00 PM  Rolena Infante, MS [facility] SLP Acute Rehab Services Office [phone]

## 2023-11-22 NOTE — Progress Notes (Signed)
PT Note  Patient Details Name: Zachary [name] MRN: [id number] DOB: 03/18/1951   Cancelled Treatment:    Reason Eval/Treat Not Completed: Other (comment). PT arrived 1619 caregiver present pt in bed. Caregiver indicated pt had a busy day and appears to be agitated and not responsive. Pt exhibiting writhing behavior in bed. Caregiver reported pt will not be able to participate with therapy today. Pt underwent barium swallow. PT to continue to follow acutely.   Johnny Bridge, PT Acute Rehab   Jacqualyn Posey 11/22/2023, 5:15 PM

## 2023-11-22 NOTE — Progress Notes (Signed)
Speech Language Pathology Treatment: Dysphagia  Patient Details Name: Zachary [name] MRN: [id number] DOB: 1951/01/20 Today's Date: 11/22/2023 Time: (P) 1430-(P) 1440 SLP Time Calculation (min) (ACUTE ONLY): (P) 10 min  Assessment / Plan / Recommendation Clinical Impression  SLP follow-up with patient's wife Zachary [name] to review MBS study in detail.  Showed wife several fluoroscopy loops explaining source of dysphagia as well as compensation strategies.  Explained in detail that patient did not aspirate or penetrated during the swallow evaluation however given level of dysphagia and her report of him coughing with intake, SLP certain that he does experience aspiration.  Reviewed role of SLP is to mitigate frequency and amount of aspiration.  Using teach back reviewed optimal positioning, compensation strategies as well as gustatory changes associated with cognitive deficits and providing foods and drinks that have stronger taste to help and eliciting swallow trigger.  Recommend follow-up at next level of care in same capacity that patient was already receiving speech pathology.  Reviewed importance of oral care with wife to decrease bacterial burden on patient as he appeared to have secretions retained in his pharynx.  Wife given opportunities to ask questions and using teach back reported understanding to information provided.     HPI HPI: 73 year old gentleman with prior history of Parkinson disease mostly wheelchair-bound, orthostatic hypotension, hearing loss, BPH, asthma, presented with cough and congestion and altered mental status.   chest x-ray shows patchy right lower lobe airspace disease consistent with bronchopneumonia,  Respiratory panel is positive for influenza A. CT head 11/20/2023 Atrophy and chronic small vessel ischemic changes of the white matter.  Wife reports pt has dysphagia - coughs with intake including solids and liquids.      SLP Plan  Continue with current plan of care       Recommendations for follow up therapy are one component of a multi-disciplinary discharge planning process, led by the attending physician.  Recommendations may be updated based on patient status, additional functional criteria and insurance authorization.    Recommendations  Diet recommendations: Dysphagia 3 (mechanical soft);Thin liquid Liquids provided via: Straw;Cup;Teaspoon Medication Administration: Crushed with puree Supervision: Full supervision/cueing for compensatory strategies;Trained caregiver to feed patient Compensations: Slow rate;Small sips/bites (Start intake with liquids, observe patient swallow before providing more) Postural Changes and/or Swallow Maneuvers:  (Had a bed at approximately 65 degrees, remained in approximately 65 degrees for approximately 30 minutes after eating)                  Oral care QID   Frequent or constant Supervision/Assistance Dysphagia, oral phase (R13.11);Dysphagia, pharyngoesophageal phase (R13.14)     Continue with current plan of care    Zachary Infante, MS [facility] SLP Acute Rehab Services Office [phone]  Zachary [name]  11/22/2023, 4:21 PM

## 2023-11-22 NOTE — Progress Notes (Signed)
[facility]                                                                               Zachary [name], is a 73 y.o. male, DOB - 1951-07-08, ZOX:[id number] Admit date - 11/19/2023    Outpatient Primary MD for the patient is [name], MD  LOS - 2  days    Brief summary    73 year old gentleman with prior history of Parkinson disease mostly wheelchair-bound, orthostatic hypotension, BPH, asthma, presented with cough and congestion and altered mental status.   chest x-ray shows patchy right lower lobe airspace disease consistent with bronchopneumonia  Respiratory panel is positive for influenza A  Assessment & Plan    Assessment and Plan:   Right lower lobe pneumonia Started the patient on IV Rocephin and azithromycin, continue the same.  Follow blood cultures and sputum cultures Streptococcal antigen is negative. Legionella is pending.  Nasal cannula oxygen to keep sats greater than 90%. Will plan to wean his oxygen in the next 24 hours.  Wbc count normalized.   Influenza A infection patient on Tamiflu continue the same  Oral candidiasis:  One dose of diflucan given. Continue with nystatin.    Hypokalemia Replaced, repeat in am.     History of Parkinson's disease Continue the same medications  Mild normocytic anemia Hemoglobin around 10. Recommend iron supplementation on discharge.    Some coughing while eating. SLP evaluation ordered. Plan for MBS today.      RN Pressure Injury Documentation:    Malnutrition Type:  Nutrition Problem: Severe Malnutrition Etiology: chronic illness (Parkinson's)   Malnutrition Characteristics:  Signs/Symptoms: moderate fat depletion, severe muscle depletion, percent weight loss   Nutrition Interventions:  Interventions: Ensure Enlive (each supplement provides 350kcal and 20 grams of protein), MVI  Estimated body mass index is 24.51 kg/m as calculated from the following:   Height as of  08/08/23: 5\' 9"  (1.753 m).   Weight as of 08/08/23: 75.3 kg.  Code Status: DN limited.  DVT Prophylaxis:  SCDs Start: 11/19/23 2207   Level of Care: Level of care: Med-Surg Family Communication: discussed with wife at bedside.   Disposition Plan:     Remains inpatient appropriate:  IV antibiotics.   Procedures:  None.   Consultants:   None.   Antimicrobials:   Anti-infectives (From admission, onward)    Start     Dose/Rate Route Frequency Ordered Stop   11/22/23 1200  fluconazole (DIFLUCAN) tablet 200 mg        200 mg Oral  Once 11/22/23 1052 11/22/23 1215   11/20/23 1800  azithromycin (ZITHROMAX) 500 mg in sodium chloride 0.9 % 250 mL IVPB        500 mg 250 mL/hr over 60 Minutes Intravenous Every 24 hours 11/19/23 2034 11/25/23 1759   11/20/23 1000  cefTRIAXone (ROCEPHIN) 2 g in sodium chloride 0.9 % 100 mL IVPB        2 g 200 mL/hr over 30 Minutes Intravenous Every 24 hours 11/19/23 2034 11/25/23 0959   11/19/23 2315  oseltamivir (TAMIFLU) capsule 75 mg        75 mg Oral 2 times daily 11/19/23 2223  11/24/23 2159   11/19/23 1800  cefTRIAXone (ROCEPHIN) 1 g in sodium chloride 0.9 % 100 mL IVPB        1 g 200 mL/hr over 30 Minutes Intravenous  Once 11/19/23 1750 11/19/23 1903   11/19/23 1800  azithromycin (ZITHROMAX) 500 mg in sodium chloride 0.9 % 250 mL IVPB        500 mg 250 mL/hr over 60 Minutes Intravenous  Once 11/19/23 1750 11/19/23 1903        Medications  Scheduled Meds:  carbidopa-levodopa  1 tablet Oral QHS   carbidopa-levodopa  2 tablet Oral 6 times per day   carbidopa-levodopa  3 tablet Oral Daily   clonazepam  0.25 mg Oral BID   feeding supplement  237 mL Oral BID BM   finasteride  5 mg Oral Q2000   fludrocortisone  0.1 mg Oral Daily   guaiFENesin  600 mg Oral BID   midodrine  5-10 mg Oral TID WC   multivitamin with minerals  1 tablet Oral Daily   nystatin  5 mL Oral QID   oseltamivir  75 mg Oral BID   polyethylene glycol  17 g Oral Daily    senna-docusate  2 tablet Oral BID   Continuous Infusions:  sodium chloride 75 mL/hr at 11/22/23 1220   azithromycin 500 mg (11/21/23 1831)   cefTRIAXone (ROCEPHIN)  IV 2 g (11/22/23 0809)   PRN Meds:.acetaminophen **OR** acetaminophen, albuterol, benzonatate, dextromethorphan-guaiFENesin, HYDROcodone-acetaminophen, ondansetron **OR** ondansetron (ZOFRAN) IV    Subjective:   Zachary [name] was seen and examined today.  Dry mouth, coughing while drinking.   Objective:   Vitals:   11/21/23 1244 11/21/23 2030 11/22/23 0612 11/22/23 1112  BP: 139/86 137/87 (!) 163/87 124/81  Pulse: 67 85 (!) 55   Resp: 16 20 18 20   Temp: 98.4 F (36.9 C) 97.7 F (36.5 C) 97.8 F (36.6 C) (!) 97.5 F (36.4 C)  TempSrc: Oral Axillary Axillary Axillary  SpO2: 97% 94% 100% 99%    Intake/Output Summary (Last 24 hours) at 11/22/2023 1237 Last data filed at 11/22/2023 7829 Gross per 24 hour  Intake --  Output 1500 ml  Net -1500 ml   There were no vitals filed for this visit.   Exam General exam: Appears calm and comfortable  Respiratory system: Clear to auscultation. Respiratory effort normal. Cardiovascular system: S1 & S2 heard, RRR. No JVD,  Gastrointestinal system: Abdomen is nondistended, soft and nontender. Central nervous system: Alert and oriented to person and place.  Extremities: warm extremities,no edema.  Skin: No rashes,  Psychiatry: no agitation.       Data Reviewed:  I have personally reviewed following labs and imaging studies   CBC Lab Results  Component Value Date   WBC 6.9 11/22/2023   RBC 3.34 (L) 11/22/2023   HGB 10.5 (L) 11/22/2023   HCT 32.1 (L) 11/22/2023   MCV 96.1 11/22/2023   MCH 31.4 11/22/2023   PLT 226 11/22/2023   MCHC 32.7 11/22/2023   RDW 11.9 11/22/2023   LYMPHSABS 0.9 11/22/2023   MONOABS 0.6 11/22/2023   EOSABS 0.0 11/22/2023   BASOSABS 0.0 11/22/2023     Last metabolic panel Lab Results  Component Value Date   NA 139 11/22/2023   K  3.2 (L) 11/22/2023   CL 103 11/22/2023   CO2 27 11/22/2023   BUN 12 11/22/2023   CREATININE 0.52 (L) 11/22/2023   GLUCOSE 96 11/22/2023   GFRNONAA >60 11/22/2023   GFRAA >90 09/15/2012   CALCIUM  8.1 (L) 11/22/2023   PHOS 2.5 11/20/2023   PROT 5.8 (L) 11/20/2023   ALBUMIN 3.1 (L) 11/20/2023   BILITOT 1.0 11/20/2023   ALKPHOS 43 11/20/2023   AST 11 (L) 11/20/2023   ALT 6 11/20/2023   ANIONGAP 9 11/22/2023    CBG (last 3)  No results for input(s): "GLUCAP" in the last 72 hours.    Coagulation Profile: No results for input(s): "INR", "PROTIME" in the last 168 hours.   Radiology Studies: ECHOCARDIOGRAM COMPLETE Result Date: 11/21/2023    ECHOCARDIOGRAM REPORT   Patient Name:   Zachary [name] Date of Exam: 11/21/2023 Medical Rec #:  [id number]      Height:       69.0 in Accession #:    [id number]     Weight:       166.0 lb Date of Birth:  1951-10-24      BSA:          1.909 m Patient Age:    72 years       BP:           159/98 mmHg Patient Gender: M              HR:           67 bpm. Exam Location:  Inpatient Procedure: 2D Echo, Cardiac Doppler and Color Doppler Indications:    Murmur  History:        Patient has prior history of Echocardiogram examinations, most                 recent 05/30/2022. Signs/Symptoms:Syncope.  Sonographer:    Amy Chionchio Referring Phys: [id number] ANASTASSIA DOUTOVA IMPRESSIONS  1. Left ventricular ejection fraction, by estimation, is 60 to 65%. The left ventricle has normal function. The left ventricle has no regional wall motion abnormalities. There is mild concentric left ventricular hypertrophy. Left ventricular diastolic parameters were normal.  2. Right ventricular systolic function is normal. The right ventricular size is normal.  3. The mitral valve is normal in structure. No evidence of mitral valve regurgitation. No evidence of mitral stenosis.  4. The aortic valve is normal in structure. Aortic valve regurgitation is not visualized. No aortic stenosis is  present.  5. The inferior vena cava is normal in size with greater than 50% respiratory variability, suggesting right atrial pressure of 3 mmHg. FINDINGS  Left Ventricle: Left ventricular ejection fraction, by estimation, is 60 to 65%. The left ventricle has normal function. The left ventricle has no regional wall motion abnormalities. The left ventricular internal cavity size was normal in size. There is  mild concentric left ventricular hypertrophy. Left ventricular diastolic parameters were normal. Right Ventricle: The right ventricular size is normal. No increase in right ventricular wall thickness. Right ventricular systolic function is normal. Left Atrium: Left atrial size was normal in size. Right Atrium: Right atrial size was normal in size. Pericardium: There is no evidence of pericardial effusion. Presence of epicardial fat layer. Mitral Valve: The mitral valve is normal in structure. No evidence of mitral valve regurgitation. No evidence of mitral valve stenosis. MV peak gradient, 2.8 mmHg. The mean mitral valve gradient is 1.0 mmHg. Tricuspid Valve: The tricuspid valve is normal in structure. Tricuspid valve regurgitation is not demonstrated. No evidence of tricuspid stenosis. Aortic Valve: The aortic valve is normal in structure. Aortic valve regurgitation is not visualized. No aortic stenosis is present. Aortic valve mean gradient measures 5.0 mmHg. Aortic valve peak gradient measures 9.6 mmHg. Aortic valve  area, by VTI measures 2.87 cm. Pulmonic Valve: The pulmonic valve was normal in structure. Pulmonic valve regurgitation is not visualized. No evidence of pulmonic stenosis. Aorta: The aortic root is normal in size and structure. Venous: The inferior vena cava is normal in size with greater than 50% respiratory variability, suggesting right atrial pressure of 3 mmHg. IAS/Shunts: No atrial level shunt detected by color flow Doppler.  LEFT VENTRICLE PLAX 2D LVIDd:         4.50 cm      Diastology  LVIDs:         3.30 cm      LV e' medial:    8.02 cm/s LV PW:         1.00 cm      LV E/e' medial:  7.2 LV IVS:        1.10 cm      LV e' lateral:   9.51 cm/s LVOT diam:     2.10 cm      LV E/e' lateral: 6.0 LV SV:         80 LV SV Index:   42 LVOT Area:     3.46 cm  LV Volumes (MOD) LV vol d, MOD A2C: 126.0 ml LV vol d, MOD A4C: 91.3 ml LV vol s, MOD A2C: 34.4 ml LV vol s, MOD A4C: 37.0 ml LV SV MOD A2C:     91.6 ml LV SV MOD A4C:     91.3 ml LV SV MOD BP:      74.0 ml RIGHT VENTRICLE             IVC RV Basal diam:  4.00 cm     IVC diam: 1.90 cm RV S prime:     14.20 cm/s TAPSE (M-mode): 2.1 cm LEFT ATRIUM           Index        RIGHT ATRIUM           Index LA Vol (A2C): 58.0 ml 30.39 ml/m  RA Area:     19.90 cm                                    RA Volume:   52.70 ml  27.61 ml/m  AORTIC VALVE                     PULMONIC VALVE AV Area (Vmax):    2.88 cm      PV Vmax:       0.89 m/s AV Area (Vmean):   2.82 cm      PV Peak grad:  3.2 mmHg AV Area (VTI):     2.87 cm AV Vmax:           155.00 cm/s AV Vmean:          107.000 cm/s AV VTI:            0.280 m AV Peak Grad:      9.6 mmHg AV Mean Grad:      5.0 mmHg LVOT Vmax:         129.00 cm/s LVOT Vmean:        87.100 cm/s LVOT VTI:          0.232 m LVOT/AV VTI ratio: 0.83  AORTA Ao Root diam: 3.20 cm Ao Asc diam:  3.40 cm MITRAL VALVE  TRICUSPID VALVE MV Area (PHT): 3.87 cm    TR Peak grad:   21.3 mmHg MV Area VTI:   3.16 cm    TR Vmax:        231.00 cm/s MV Peak grad:  2.8 mmHg MV Mean grad:  1.0 mmHg    SHUNTS MV Vmax:       0.84 m/s    Systemic VTI:  0.23 m MV Vmean:      52.9 cm/s   Systemic Diam: 2.10 cm MV Decel Time: 196 msec MV E velocity: 57.40 cm/s MV A velocity: 60.70 cm/s MV E/A ratio:  0.95 Kardie Tobb DO Electronically signed by Thomasene Ripple DO Signature Date/Time: 11/21/2023/11:39:42 AM    Final        Kathlen Mody M.D. [facility] 11/22/2023, 12:37 PM  Available via Epic secure chat 7am-7pm After 7 pm, please refer to  night coverage provider listed on amion.

## 2023-11-22 NOTE — Procedures (Signed)
Modified Barium Swallow Study  Patient Details  Name: Zachary [name] MRN: [id number] Date of Birth: September 14, 1951  Today's Date: 11/22/2023  Modified Barium Swallow completed.  Full report located under Chart Review in the Imaging Section.  History of Present Illness 73 year old gentleman with prior history of Parkinson disease mostly wheelchair-bound, orthostatic hypotension, hearing loss, BPH, asthma, presented with cough and congestion and altered mental status.   chest x-ray shows patchy right lower lobe airspace disease consistent with bronchopneumonia,  Respiratory panel is positive for influenza A. CT head 11/20/2023 Atrophy and chronic small vessel ischemic changes of the white matter.  Wife reports pt has dysphagia - coughs with intake including solids and liquids.   Clinical Impression Study was limited due to patient frequently turning his head requiring total assistance to attempt head neutral for optimal viewing.  That being said patient presents with oral more than pharyngeal dysphagia mostly characterized by impaired initiation, poor lingual control resulting in lingual pumping, delayed oral transit name and premature spillage of barium into pharynx.  Patient noted to pull he was masticating when his mouth was clear, wife present reports this is not abnormal.  Patient tested with head of the chair approximately reclined to 65 degrees due to positioning limitations with his contractures, but this position appeared to help protect his airway better as it allowed liquid barium to be retained in posterior pharynx before swallow was triggered at piriform sinus.  Fortunately pharyngeal swallow is strong wants triggered resulting in no significant retention.  Patient does appear with more retention in pharynx with liquids than solids suspect decreased pharyngeal esophageal segment opening -but this did not result in post swallow aspiration or penetration.  Various compensation strategies attempted  to improve initiation timing including counting 1-3 swallow and providing extra bolus to improve sensation.  Patient did not aspirate or penetrate but given report he coughs sometimes with intake and his level of dysphagia, suspect he does experience occasional aspiration.  Recommend mechanical soft diet at this time with strict precautions including observing laryngeal elevation to assure patient swallows.  SLP will follow-up for dysphagia management to help mitigate aspiration.  Stevan Born was present for the exam Factors that may increase risk of adverse event in presence of aspiration Rubye Oaks & Clearance Coots 2021): Reduced cognitive function;Limited mobility;Frail or deconditioned;Dependence for feeding and/or oral hygiene;Reduced saliva;Weak cough  Swallow Evaluation Recommendations Recommendations: PO diet PO Diet Recommendation: Dysphagia 3 (Mechanical soft);Thin liquids (Level 0) Liquid Administration via: Cup;Straw;Spoon Medication Administration: Crushed with puree Supervision: Full supervision/cueing for swallowing strategies Swallowing strategies  : Slow rate;Small bites/sips (start intake with liquids) Postural changes: Partially reclined for meals (stay partially upright for 30 minutes after meals) Oral care recommendations: Oral care BID (2x/day);Use suctioning for oral care Caregiver Recommendations: Have oral suction available     Rolena Infante, MS [facility] SLP Acute Rehab Services Office [phone]  Zachary [name] 11/22/2023,4:13 PM

## 2023-11-22 NOTE — Plan of Care (Signed)
Problem: Education: Goal: Knowledge of General Education information will improve Description: Including pain rating scale, medication(s)/side effects and non-pharmacologic comfort measures Outcome: Not Progressing   Problem: Health Behavior/Discharge Planning: Goal: Ability to manage health-related needs will improve Outcome: Not Progressing   Problem: Clinical Measurements: Goal: Ability to maintain clinical measurements within normal limits will improve Outcome: Not Progressing Goal: Will remain free from infection Outcome: Not Progressing Goal: Diagnostic test results will improve Outcome: Not Progressing Goal: Respiratory complications will improve Outcome: Not Progressing Goal: Cardiovascular complication will be avoided Outcome: Not Progressing   Problem: Activity: Goal: Risk for activity intolerance will decrease Outcome: Not Progressing   Problem: Nutrition: Goal: Adequate nutrition will be maintained Outcome: Not Progressing   Problem: Coping: Goal: Level of anxiety will decrease Outcome: Not Progressing   Problem: Elimination: Goal: Will not experience complications related to bowel motility Outcome: Not Progressing Goal: Will not experience complications related to urinary retention Outcome: Not Progressing   Problem: Pain Managment: Goal: General experience of comfort will improve and/or be controlled Outcome: Not Progressing   Problem: Safety: Goal: Ability to remain free from injury will improve Outcome: Not Progressing   Problem: Skin Integrity: Goal: Risk for impaired skin integrity will decrease Outcome: Not Progressing   Problem: Activity: Goal: Ability to tolerate increased activity will improve Outcome: Not Progressing   Problem: Clinical Measurements: Goal: Ability to maintain a body temperature in the normal range will improve Outcome: Not Progressing   Problem: Respiratory: Goal: Ability to maintain adequate ventilation will  improve Outcome: Not Progressing Goal: Ability to maintain a clear airway will improve Outcome: Not Progressing

## 2023-11-23 ENCOUNTER — Inpatient Hospital Stay (HOSPITAL_COMMUNITY): Payer: Medicare PPO

## 2023-11-23 DIAGNOSIS — J09X1 Influenza due to identified novel influenza A virus with pneumonia: Secondary | ICD-10-CM | POA: Diagnosis not present

## 2023-11-23 DIAGNOSIS — E876 Hypokalemia: Secondary | ICD-10-CM | POA: Diagnosis not present

## 2023-11-23 DIAGNOSIS — G20B1 Parkinson's disease with dyskinesia, without mention of fluctuations: Secondary | ICD-10-CM | POA: Diagnosis not present

## 2023-11-23 DIAGNOSIS — J189 Pneumonia, unspecified organism: Secondary | ICD-10-CM | POA: Diagnosis not present

## 2023-11-23 LAB — BASIC METABOLIC PANEL WITH GFR
Anion gap: 8 (ref 5–15)
BUN: 9 mg/dL (ref 8–23)
CO2: 28 mmol/L (ref 22–32)
Calcium: 8.3 mg/dL — ABNORMAL LOW (ref 8.9–10.3)
Chloride: 102 mmol/L (ref 98–111)
Creatinine, Ser: 0.55 mg/dL — ABNORMAL LOW (ref 0.61–1.24)
GFR, Estimated: >60 mL/min
Glucose, Bld: 121 mg/dL — ABNORMAL HIGH (ref 70–99)
Potassium: 3.9 mmol/L (ref 3.5–5.1)
Sodium: 138 mmol/L (ref 135–145)

## 2023-11-23 MED ORDER — VITAMIN B-12 1000 MCG PO TABS
1000.0000 ug | ORAL_TABLET | Freq: Every day | ORAL | Status: DC
  Administered 2023-11-24: 1000 ug via ORAL
  Filled 2023-11-23: qty 1

## 2023-11-23 MED ORDER — SENNOSIDES-DOCUSATE SODIUM 8.6-50 MG PO TABS
2.0000 | ORAL_TABLET | Freq: Two times a day (BID) | ORAL | Status: DC
  Administered 2023-11-23 – 2023-11-24 (×3): 2 via ORAL
  Filled 2023-11-23 (×3): qty 2

## 2023-11-23 MED ORDER — CYANOCOBALAMIN 1000 MCG/ML IJ SOLN
1000.0000 ug | Freq: Once | INTRAMUSCULAR | Status: AC
  Administered 2023-11-23: 1000 ug via INTRAMUSCULAR
  Filled 2023-11-23: qty 1

## 2023-11-23 NOTE — Progress Notes (Signed)
[facility]                                                                               Zachary [name], is a 73 y.o. male, DOB - 1951/04/01, [id number]:[id number] Admit date - 11/19/2023    Outpatient Primary MD for the patient is [name], MD  LOS - 3  days    Brief summary    73 year old gentleman with prior history of Parkinson disease mostly wheelchair-bound, orthostatic hypotension, BPH, asthma, presented with cough and congestion and altered mental status.   chest x-ray shows patchy right lower lobe airspace disease consistent with bronchopneumonia  Respiratory panel is positive for influenza A  Assessment & Plan    Assessment and Plan:   Right lower lobe pneumonia Started the patient on IV Rocephin and azithromycin, continue the same. D/c antibiotics after tomorrow's dose.  Blood cultures are negative so far.  Streptococcal antigen is negative. Legionella is pending.  Nasal cannula oxygen to keep sats greater than 90%. Will plan to wean his oxygen in the next 24 hours.  Wbc count normalized.   Influenza A infection patient on Tamiflu , complete the course.   Oral candidiasis:  One dose of diflucan given. Continue with nystatin.    Hypokalemia Replaced, repeat level wnl.     History of Parkinson's disease Continue the same medications   Some coughing while eating. SLP evaluation ordered, underwent MBS.  Started the patient on dysphagia 3 diet.    Anemia of chronic disease/ iron deficiency anemia/ vitamin b12 deficiency.  Hemoglobin stable around 10.  Vitamin B12 injection ordered.  Iron Supplementation will be added on discharge.    Constipation:  Continue with senna, colace and miralax.      RN Pressure Injury Documentation:    Malnutrition Type:  Nutrition Problem: Severe Malnutrition Etiology: chronic illness (Parkinson's)   Malnutrition Characteristics:  Signs/Symptoms: moderate fat depletion, severe muscle  depletion, percent weight loss   Nutrition Interventions:  Interventions: Ensure Enlive (each supplement provides 350kcal and 20 grams of protein), MVI  Estimated body mass index is 24.51 kg/m as calculated from the following:   Height as of 08/08/23: 5\' 9"  (1.753 m).   Weight as of 08/08/23: 75.3 kg.  Code Status: DN limited.  DVT Prophylaxis:  SCDs Start: 11/19/23 2207   Level of Care: Level of care: Med-Surg Family Communication: discussed with wife at bedside.   Disposition Plan:     Remains inpatient appropriate:  IV antibiotics.   Procedures:  None.   Consultants:   None.   Antimicrobials:   Anti-infectives (From admission, onward)    Start     Dose/Rate Route Frequency Ordered Stop   11/22/23 1800  azithromycin (ZITHROMAX) tablet 500 mg        500 mg Oral Daily-1800 11/22/23 1405 11/24/23 1759   11/22/23 1200  fluconazole (DIFLUCAN) tablet 200 mg        200 mg Oral  Once 11/22/23 1052 11/22/23 1215   11/20/23 1800  azithromycin (ZITHROMAX) 500 mg in sodium chloride 0.9 % 250 mL IVPB  Status:  Discontinued        500 mg 250 mL/hr over 60 Minutes  Intravenous Every 24 hours 11/19/23 2034 11/22/23 1405   11/20/23 1000  cefTRIAXone (ROCEPHIN) 2 g in sodium chloride 0.9 % 100 mL IVPB        2 g 200 mL/hr over 30 Minutes Intravenous Every 24 hours 11/19/23 2034 11/25/23 0959   11/19/23 2315  oseltamivir (TAMIFLU) capsule 75 mg        75 mg Oral 2 times daily 11/19/23 2223 11/24/23 2159   11/19/23 1800  cefTRIAXone (ROCEPHIN) 1 g in sodium chloride 0.9 % 100 mL IVPB        1 g 200 mL/hr over 30 Minutes Intravenous  Once 11/19/23 1750 11/19/23 1903   11/19/23 1800  azithromycin (ZITHROMAX) 500 mg in sodium chloride 0.9 % 250 mL IVPB        500 mg 250 mL/hr over 60 Minutes Intravenous  Once 11/19/23 1750 11/19/23 1903        Medications  Scheduled Meds:  azithromycin  500 mg Oral q1800   carbidopa-levodopa  1 tablet Oral QHS   carbidopa-levodopa  2 tablet Oral  6 times per day   carbidopa-levodopa  3 tablet Oral Daily   clonazepam  0.25 mg Oral BID   feeding supplement  237 mL Oral BID BM   finasteride  5 mg Oral Q2000   fludrocortisone  0.1 mg Oral Daily   guaiFENesin  600 mg Oral BID   midodrine  5-10 mg Oral TID WC   multivitamin with minerals  1 tablet Oral Daily   nystatin  5 mL Oral QID   oseltamivir  75 mg Oral BID   polyethylene glycol  17 g Oral Daily   senna-docusate  2 tablet Oral BID   Continuous Infusions:  cefTRIAXone (ROCEPHIN)  IV 2 g (11/23/23 1149)   PRN Meds:.acetaminophen **OR** acetaminophen, albuterol, benzonatate, dextromethorphan-guaiFENesin, HYDROcodone-acetaminophen, ondansetron **OR** ondansetron (ZOFRAN) IV    Subjective:   Amiel Mccaffrey was seen and examined today.  No new complaints.   Objective:   Vitals:   11/22/23 1249 11/22/23 2130 11/23/23 0527 11/23/23 1254  BP: (!) 164/67 (!) 146/92 (!) 152/95 (!) 148/106  Pulse: 68 65 61 67  Resp: 16 15 15    Temp: 98.3 F (36.8 C) 97.7 F (36.5 C) 98.5 F (36.9 C) 98.1 F (36.7 C)  TempSrc:  Oral Oral   SpO2: 98% 98% 99% 99%    Intake/Output Summary (Last 24 hours) at 11/23/2023 1647 Last data filed at 11/23/2023 1500 Gross per 24 hour  Intake 2164.88 ml  Output 1700 ml  Net 464.88 ml   There were no vitals filed for this visit.   Exam General exam: Appears calm and comfortable  Respiratory system: Clear to auscultation. Respiratory effort normal. Cardiovascular system: S1 & S2 heard, RRR. No JVD,  Gastrointestinal system: Abdomen is nondistended, soft and nontender. Central nervous system: Alert and oriented to person and place.  Extremities: Symmetric 5 x 5 power. Skin: No rashes,  Psychiatry:  Mood & affect appropriate.        Data Reviewed:  I have personally reviewed following labs and imaging studies   CBC Lab Results  Component Value Date   WBC 6.9 11/22/2023   RBC 3.34 (L) 11/22/2023   HGB 10.5 (L) 11/22/2023   HCT 32.1 (L)  11/22/2023   MCV 96.1 11/22/2023   MCH 31.4 11/22/2023   PLT 226 11/22/2023   MCHC 32.7 11/22/2023   RDW 11.9 11/22/2023   LYMPHSABS 0.9 11/22/2023   MONOABS 0.6 11/22/2023   EOSABS 0.0  11/22/2023   BASOSABS 0.0 11/22/2023     Last metabolic panel Lab Results  Component Value Date   NA 138 11/23/2023   K 3.9 11/23/2023   CL 102 11/23/2023   CO2 28 11/23/2023   BUN 9 11/23/2023   CREATININE 0.55 (L) 11/23/2023   GLUCOSE 121 (H) 11/23/2023   GFRNONAA >60 11/23/2023   GFRAA >90 09/15/2012   CALCIUM 8.3 (L) 11/23/2023   PHOS 2.5 11/20/2023   PROT 5.8 (L) 11/20/2023   ALBUMIN 3.1 (L) 11/20/2023   BILITOT 1.0 11/20/2023   ALKPHOS 43 11/20/2023   AST 11 (L) 11/20/2023   ALT 6 11/20/2023   ANIONGAP 8 11/23/2023    CBG (last 3)  No results for input(s): "GLUCAP" in the last 72 hours.    Coagulation Profile: No results for input(s): "INR", "PROTIME" in the last 168 hours.   Radiology Studies: DG Swallowing Func-Speech Pathology Result Date: 11/22/2023 Table formatting from the original result was not included. Modified Barium Swallow Study Patient Details Name: ETHANIEL GARFIELD MRN: [id number] Date of Birth: May 09, 1951 Today's Date: 11/22/2023 HPI/PMH: HPI: 73 year old gentleman with prior history of Parkinson disease mostly wheelchair-bound, orthostatic hypotension, hearing loss, BPH, asthma, presented with cough and congestion and altered mental status.   chest x-ray shows patchy right lower lobe airspace disease consistent with bronchopneumonia,  Respiratory panel is positive for influenza A. CT head 11/20/2023 Atrophy and chronic small vessel ischemic changes of the white matter.  Wife reports pt has dysphagia - coughs with intake including solids and liquids. Clinical Impression: Clinical Impression: Study was limited due to patient frequently turning his head requiring total assistance to attempt head neutral for optimal viewing.  That being said patient presents with oral more  than pharyngeal dysphagia mostly characterized by impaired initiation, poor lingual control resulting in lingual pumping, delayed oral transit name and premature spillage of barium into pharynx.  Patient noted to pull he was masticating when his mouth was clear, wife present reports this is not abnormal.  Patient tested with head of the chair approximately reclined to 65 degrees due to positioning limitations with his contractures, but this position appeared to help protect his airway better as it allowed liquid barium to be retained in posterior pharynx before swallow was triggered at piriform sinus.  Fortunately pharyngeal swallow is strong wants triggered resulting in no significant retention.  Patient does appear with more retention in pharynx with liquids than solids suspect decreased pharyngeal esophageal segment opening -but this did not result in post swallow aspiration or penetration.  Various compensation strategies attempted to improve initiation timing including counting 1-3 swallow and providing extra bolus to improve sensation.  Patient did not aspirate or penetrate but given report he coughs sometimes with intake and his level of dysphagia, suspect he does experience occasional aspiration.  Recommend mechanical soft diet at this time with strict precautions including observing laryngeal elevation to assure patient swallows.  SLP will follow-up for dysphagia management to help mitigate aspiration.  Stevan Born was present for the exam Factors that may increase risk of adverse event in presence of aspiration Rubye Oaks & Clearance Coots 2021): Factors that may increase risk of adverse event in presence of aspiration Rubye Oaks & Clearance Coots 2021): Reduced cognitive function; Limited mobility; Frail or deconditioned; Dependence for feeding and/or oral hygiene; Reduced saliva; Weak cough Recommendations/Plan: Swallowing Evaluation Recommendations Swallowing Evaluation Recommendations Recommendations: PO diet PO Diet  Recommendation: Dysphagia 3 (Mechanical soft); Thin liquids (Level 0) Liquid Administration via: Cup; Straw; Spoon Medication Administration: Crushed with  puree Supervision: Full supervision/cueing for swallowing strategies Swallowing strategies  : Slow rate; Small bites/sips (start intake with liquids) Postural changes: Partially reclined for meals (stay partially upright for 30 minutes after meals) Oral care recommendations: Oral care BID (2x/day); Use suctioning for oral care Caregiver Recommendations: Have oral suction available Treatment Plan Treatment Plan Treatment recommendations: Therapy as outlined in treatment plan below Follow-up recommendations: Other (comment) (continue slp services as prior to admission) Functional status assessment: Patient has had a recent decline in their functional status and demonstrates the ability to make significant improvements in function in a reasonable and predictable amount of time. Treatment frequency: Min 1x/week Treatment duration: 1 week Interventions: Aspiration precaution training; Compensatory techniques; Patient/family education; Diet toleration management by SLP Recommendations Recommendations for follow up therapy are one component of a multi-disciplinary discharge planning process, led by the attending physician.  Recommendations may be updated based on patient status, additional functional criteria and insurance authorization. Assessment: Orofacial Exam: Orofacial Exam Oral Cavity: Oral Hygiene: Xerostomia Oral Cavity - Dentition: Adequate natural dentition Orofacial Anatomy: WFL Oral Motor/Sensory Function: Other (comment) (appears with mild left facial asymmetry, able to seal lips on spoon/straw) Anatomy: Anatomy: Suspected cervical osteophytes Boluses Administered: Boluses Administered Boluses Administered: Thin liquids (Level 0); Mildly thick liquids (Level 2, nectar thick); Puree; Solid  Oral Impairment Domain: Oral Impairment Domain Lip Closure: Escape  progressing to mid-chin Tongue control during bolus hold: Not tested Bolus preparation/mastication: Slow prolonged chewing/mashing with complete recollection Bolus transport/lingual motion: Repetitive/disorganized tongue motion Oral residue: Residue collection on oral structures Location of oral residue : Tongue Initiation of pharyngeal swallow : Pyriform sinuses  Pharyngeal Impairment Domain: Pharyngeal Impairment Domain Soft palate elevation: No bolus between soft palate (SP)/pharyngeal wall (PW) Laryngeal elevation: Complete superior movement of thyroid cartilage with complete approximation of arytenoids to epiglottic petiole Anterior hyoid excursion: Complete anterior movement Epiglottic movement: Complete inversion Laryngeal vestibule closure: Complete, no air/contrast in laryngeal vestibule Pharyngeal stripping wave : Present - complete Pharyngeal contraction (A/P view only): N/A Pharyngoesophageal segment opening: Partial distention/partial duration, partial obstruction of flow Tongue base retraction: Trace column of contrast or air between tongue base and PPW Pharyngeal residue: Trace residue within or on pharyngeal structures Location of pharyngeal residue: Tongue base; Valleculae; Aryepiglottic folds; Pyriform sinuses  Esophageal Impairment Domain: Esophageal Impairment Domain Esophageal clearance upright position: Complete clearance, esophageal coating Pill: Pill Consistency administered: Puree Puree: Impaired (see clinical impressions) (slp had to suction pudding and remove tablet as pt did not orally transit it into pharynx) Penetration/Aspiration Scale Score: Penetration/Aspiration Scale Score 1.  Material does not enter airway: Thin liquids (Level 0); Mildly thick liquids (Level 2, nectar thick); Puree; Solid Compensatory Strategies: Compensatory Strategies Compensatory strategies: Yes Reclining posture: Effective Effective Reclining Posture: Thin liquid (Level 0); Mildly thick liquid (Level 2, nectar  thick) Other(comment): Ineffective; Effective (counting 1,2,3; providing more barium to help trigger swallow)   General Information: No data recorded Diet Prior to this Study: Regular; Thin liquids (Level 0)   Temperature : Normal   Respiratory Status: WFL   Supplemental O2: None (Room air)   History of Recent Intubation: No  Behavior/Cognition: Distractible; Doesn't follow directions; Alert (pt has dementia) Self-Feeding Abilities: Dependent for feeding Baseline vocal quality/speech: Hypophonia/low volume Volitional Cough: Unable to elicit Volitional Swallow: Unable to elicit Exam Limitations: Poor positioning; Excessive movement; Other (comment) (distractibility) Goal Planning: Prognosis for improved oropharyngeal function: Fair Barriers to Reach Goals: Time post onset; Severity of deficits; Overall medical prognosis No data recorded Patient/Family Stated Goal:  wife wants pt to have swallow eval done Consulted and agree with results and recommendations: Patient; Family member/caregiver Pain: Pain Assessment Pain Assessment: No/denies pain End of Session: Start Time:SLP Start Time (ACUTE ONLY): 1006 Stop Time: SLP Stop Time (ACUTE ONLY): 1115 Time Calculation:SLP Time Calculation (min) (ACUTE ONLY): 69 min Charges: SLP Evaluations $ SLP Speech Visit: 1 Visit SLP Evaluations $BSS Swallow: 1 Procedure $Swallowing Treatment: 1 Procedure SLP visit diagnosis: SLP Visit Diagnosis: Dysphagia, oral phase (R13.11); Dysphagia, pharyngoesophageal phase (R13.14) Past Medical History: Past Medical History: Diagnosis Date  Allergy   Asthma   Chest pain 09/22/2012  Frequent falls   Hearing loss 04/29/2019  utilizes bilateral hearing aids  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 [facility] 11/23/2023, 4:47 PM  Available via Epic secure chat 7am-7pm After 7 pm, please refer to night coverage provider listed on amion.

## 2023-11-23 NOTE — Plan of Care (Signed)
  Problem: Nutrition: Goal: Adequate nutrition will be maintained Outcome: Progressing   Problem: Clinical Measurements: Goal: Ability to maintain a body temperature in the normal range will improve Outcome: Progressing   Problem: Respiratory: Goal: Ability to maintain adequate ventilation will improve Outcome: Progressing

## 2023-11-24 DIAGNOSIS — E43 Unspecified severe protein-calorie malnutrition: Secondary | ICD-10-CM | POA: Diagnosis not present

## 2023-11-24 DIAGNOSIS — J09X1 Influenza due to identified novel influenza A virus with pneumonia: Secondary | ICD-10-CM | POA: Diagnosis not present

## 2023-11-24 DIAGNOSIS — J189 Pneumonia, unspecified organism: Secondary | ICD-10-CM | POA: Diagnosis not present

## 2023-11-24 DIAGNOSIS — G20B1 Parkinson's disease with dyskinesia, without mention of fluctuations: Secondary | ICD-10-CM | POA: Diagnosis not present

## 2023-11-24 LAB — CULTURE, BLOOD (ROUTINE X 2): Culture: NO GROWTH

## 2023-11-24 MED ORDER — SENNOSIDES-DOCUSATE SODIUM 8.6-50 MG PO TABS: 2.0000 | ORAL_TABLET | Freq: Every evening | ORAL | Status: AC | PRN

## 2023-11-24 MED ORDER — BENZONATATE 100 MG PO CAPS: 100.0000 mg | ORAL_CAPSULE | Freq: Three times a day (TID) | ORAL | 0 refills | Status: AC | PRN

## 2023-11-24 MED ORDER — CYANOCOBALAMIN 1000 MCG PO TABS: 1000.0000 ug | ORAL_TABLET | Freq: Every day | ORAL | 2 refills | Status: AC

## 2023-11-24 MED ORDER — AMOXICILLIN-POT CLAVULANATE 875-125 MG PO TABS: 1.0000 | ORAL_TABLET | Freq: Two times a day (BID) | ORAL | 0 refills | Status: DC

## 2023-11-24 MED ORDER — ADULT MULTIVITAMIN W/MINERALS CH: 1.0000 | ORAL_TABLET | Freq: Every day | ORAL | 2 refills | Status: AC

## 2023-11-24 MED ORDER — ENSURE ENLIVE PO LIQD: 237.0000 mL | Freq: Two times a day (BID) | ORAL | 2 refills | Status: AC

## 2023-11-24 MED ORDER — FUROSEMIDE 20 MG PO TABS
20.0000 mg | ORAL_TABLET | Freq: Once | ORAL | Status: AC
  Administered 2023-11-24: 20 mg via ORAL
  Filled 2023-11-24: qty 1

## 2023-11-24 NOTE — TOC Progression Note (Signed)
Transition of Care [facility]) - Progression Note    Patient Details  Name: Zachary [name] MRN: [id number] Date of Birth: 05-14-1951  Transition of Care [facility]) CM/SW Contact  Maryjean Ka, [place] Phone Number: 11/24/2023, 3:49 PM  Clinical Narrative:     This CSW spoke with ALF [facility]; Precious via phone [phone] who reports that patient is NOT able to transfer to ALF today because their pharmacy is closed on Sundays and there have been medication updates. This CSW inquired about what time patient can transfer to ALF tomorrow 11/25/2023 and this CSW was advised that 9:00am would be a good time but requested that CSW/TOC call ALF around 8:00am and ask to speak with the Med tech on the 200/300 hall [phone] to confirm before transfer. Precious informed this CSW that any new orders must be submitted as well. TOC to follow up in the morning 11/25/2023.   Care Team notified: Evelene Croon  Expected Discharge Plan: Assisted Living Barriers to Discharge: Continued Medical Work up  Expected Discharge Plan and Services In-house Referral: Clinical Social Work Discharge Planning Services: NA Post Acute Care Choice: Resumption of Svcs/PTA Provider Living arrangements for the past 2 months: Assisted Living Facility Expected Discharge Date: 11/24/23               DME Arranged: N/A DME Agency: NA                   Social Determinants of Health (SDOH) Interventions SDOH Screenings   Food Insecurity: No Food Insecurity (11/19/2023)  Housing: Low Risk  (11/19/2023)  Transportation Needs: No Transportation Needs (11/19/2023)  Utilities: Not At Risk (11/19/2023)  Social Connections: Patient Unable To Answer (11/19/2023)  Tobacco Use: Low Risk  (11/19/2023)    Readmission Risk Interventions    05/30/2022    1:49 PM  Readmission Risk Prevention Plan  Post Dischage Appt Complete  Medication Screening Complete  Transportation Screening Complete

## 2023-11-24 NOTE — Discharge Summary (Signed)
Physician Discharge Summary   Patient: Zachary [name] MRN: [id number] DOB: 12-Jan-1951  Admit date:     11/19/2023  Discharge date: 11/24/23  Discharge Physician: Kathlen Mody   PCP: [name], MD   Recommendations at discharge:  Please follow up with PCP in one week.  Please follow up with a repeat CXR in 2 to 4 weeks for resolution of the pneumonia Please follow up with a cbc and BMP in one week.   Discharge Diagnoses: Principal Problem:   CAP (community acquired pneumonia) Active Problems:   Parkinson's disease (HCC)   Hypokalemia   Influenza A with pneumonia   Protein-calorie malnutrition, severe   Hospital Course: 73 year old gentleman with prior history of Parkinson disease mostly wheelchair-bound, orthostatic hypotension, BPH, asthma, presented with cough and congestion and altered mental status.    chest x-ray shows patchy right lower lobe airspace disease consistent with bronchopneumonia  Respiratory panel is positive for influenza A  Assessment and Plan:   Right lower lobe pneumonia Started the patient on IV Rocephin and azithromycin, transitioned to oral antibiotics on discharge.  Recommend getting CXR in 2 to 4 weeks for resolution of the pneumonia Blood cultures are negative so far.  Streptococcal antigen is negative. Legionella is pending.  Weaned off oxygen.  Wbc count normalized.    Influenza A infection patient on Tamiflu , complete the course.    Oral candidiasis:  One dose of diflucan given. Continue with nystatin.      Hypokalemia Replaced, repeat level wnl.        History of Parkinson's disease Continue the same medications     Some coughing while eating. SLP evaluation ordered, underwent MBS.  Started the patient on dysphagia 3 diet.      Anemia of chronic disease/ iron deficiency anemia/ vitamin b12 deficiency.  Hemoglobin stable around 10.  Vitamin B12 injection ordered.  Iron Supplementation will be added on discharge.       Constipation:  Continue with senna, colace and miralax.        RN Pressure Injury Documentation:   Malnutrition Type:   Nutrition Problem: Severe Malnutrition Etiology: chronic illness (Parkinson's)     Malnutrition Characteristics:   Signs/Symptoms: moderate fat depletion, severe muscle depletion, percent weight loss     Nutrition Interventions:   Interventions: Ensure Enlive (each supplement provides 350kcal and 20 grams of protein), MVI   Estimated body mass index is 24.51 kg/m as calculated from the following:   Height as of 08/08/23: 5\' 9"  (1.753 m).   Weight as of 08/08/23: 75.3 kg.        Consultants: none. Procedures performed: none.   Disposition: Assisted living Diet recommendation:  Discharge Diet Orders (From admission, onward)     Start     Ordered   11/24/23 0000  Diet - low sodium heart healthy        11/24/23 1026           Dysphagia 3 diet.  DISCHARGE MEDICATION: Allergies as of 11/24/2023       Reactions   Pear Other (See Comments)   Unknown Not listed on MAR   Tree Extract Other (See Comments)   Unknown Not listed on MAR        Medication List     STOP taking these medications    AMBULATORY NON FORMULARY MEDICATION   divalproex 125 MG capsule Commonly known as: Depakote Sprinkles   potassium chloride SA 20 MEQ tablet Commonly known as: Jerene Dilling  TAKE these medications    albuterol 108 (90 Base) MCG/ACT inhaler Commonly known as: VENTOLIN HFA Inhale 2 puffs into the lungs every 6 (six) hours as needed for wheezing.   AMBULATORY NON FORMULARY MEDICATION Transport chair Dx: Parkinsons Disease G20   amoxicillin-clavulanate 875-125 MG tablet Commonly known as: AUGMENTIN Take 1 tablet by mouth 2 (two) times daily for 4 days. Start taking on: November 25, 2023   benzonatate 100 MG capsule Commonly known as: TESSALON Take 1 capsule (100 mg total) by mouth 3 (three) times daily as needed (refractory  cough).   carbidopa-levodopa 25-100 MG tablet Commonly known as: SINEMET IR 3 tabs AT 6:30AM/2 at 8:30AM/2 at 10:30AM/2 at 12:30PM/2 at 2:30PM/2 at 4:30pm/2 at 6:30pm. take up to extra tablet (in 1/2 tabs) per day AS NEEDED What changed: See the new instructions.   carbidopa-levodopa 50-200 MG tablet Commonly known as: SINEMET CR TAKE ONE TABLET BY MOUTH AT BEDTIME What changed: Another medication with the same name was changed. Make sure you understand how and when to take each.   clonazePAM 0.5 MG tablet Commonly known as: KLONOPIN Take 0.25 mg by mouth See admin instructions. Take 0.25 mg by mouth at 6:30 AM & 6:30 PM AND 0.25 mg every 12 hours as needed for severe anxiety or agitation   cyanocobalamin 1000 MCG tablet Take 1 tablet (1,000 mcg total) by mouth daily. Start taking on: November 25, 2023   feeding supplement Liqd Take 237 mLs by mouth 2 (two) times daily between meals.   finasteride 5 MG tablet Commonly known as: PROSCAR Take 5 mg by mouth daily at 8 pm.   fludrocortisone 0.1 MG tablet Commonly known as: FLORINEF Take 1 tablet (0.1 mg total) by mouth 2 (two) times daily. What changed: how much to take   midodrine 5 MG tablet Commonly known as: PROAMATINE TAKE (2) TABLETS BY MOUTH TWICE DAILY AT 8AM & 12PM. TAKE (1) TABLET BY MOUTH AT DINNER. What changed:  how much to take how to take this when to take this additional instructions   Mucinex DM 30-600 MG Tb12 Take 1 tablet by mouth every 12 (twelve) hours as needed (for congestion or coughing).   multivitamin with minerals Tabs tablet Take 1 tablet by mouth daily. Start taking on: November 25, 2023   potassium chloride 20 MEQ packet Commonly known as: KLOR-CON Take 40 mEq by mouth daily.   senna-docusate 8.6-50 MG tablet Commonly known as: Senokot-S Take 2 tablets by mouth at bedtime as needed for mild constipation.        Follow-up Information     [name], MD. Schedule an  appointment as soon as possible for a visit in 1 week(s).   Specialty: Internal Medicine Contact information: [address] [place] [address] [phone]                Discharge Exam: Ceasar Mons Weights   11/24/23 0558  Weight: 68.4 kg   General exam: Appears calm and comfortable  Respiratory system: Clear to auscultation. Respiratory effort normal. Cardiovascular system: S1 & S2 heard, RRR. No JVD Gastrointestinal system: Abdomen is nondistended, soft and nontender.  Central nervous system: Alert and oriented person only.  Extremities: Symmetric 5 x 5 power. Skin: No rashes, Psychiatry: Mood & affect appropriate.    Condition at discharge: fair  The results of significant diagnostics from this hospitalization (including imaging, microbiology, ancillary and laboratory) are listed below for reference.   Imaging Studies: DG CHEST PORT 1 VIEW Result  Date: 11/23/2023 CLINICAL DATA:  Follow-up for pneumonia EXAM: PORTABLE CHEST 1 VIEW COMPARISON:  Chest x-ray 11/19/2023 FINDINGS: Mild patchy airspace opacities are seen in the medial right lower lung similar to prior. No new focal lung infiltrate, pleural effusion or pneumothorax. The cardiomediastinal silhouette is within normal limits. No acute fractures are seen. IMPRESSION: Stable mild patchy airspace opacities in the medial right lower lung worrisome for pneumonia. Follow-up recommended to confirm complete resolution. Electronically Signed   By: Darliss Cheney M.D.   On: 11/23/2023 20:24   DG Swallowing Func-Speech Pathology Result Date: 11/22/2023 Table formatting from the original result was not included. Modified Barium Swallow Study Patient Details Name: ANTHONY TAMBURO MRN: [id number] Date of Birth: 08-28-1951 Today's Date: 11/22/2023 HPI/PMH: HPI: 73 year old gentleman with prior history of Parkinson disease mostly wheelchair-bound, orthostatic hypotension, hearing loss, BPH, asthma, presented with cough and congestion and  altered mental status.   chest x-ray shows patchy right lower lobe airspace disease consistent with bronchopneumonia,  Respiratory panel is positive for influenza A. CT head 11/20/2023 Atrophy and chronic small vessel ischemic changes of the white matter.  Wife reports pt has dysphagia - coughs with intake including solids and liquids. Clinical Impression: Clinical Impression: Study was limited due to patient frequently turning his head requiring total assistance to attempt head neutral for optimal viewing.  That being said patient presents with oral more than pharyngeal dysphagia mostly characterized by impaired initiation, poor lingual control resulting in lingual pumping, delayed oral transit name and premature spillage of barium into pharynx.  Patient noted to pull he was masticating when his mouth was clear, wife present reports this is not abnormal.  Patient tested with head of the chair approximately reclined to 65 degrees due to positioning limitations with his contractures, but this position appeared to help protect his airway better as it allowed liquid barium to be retained in posterior pharynx before swallow was triggered at piriform sinus.  Fortunately pharyngeal swallow is strong wants triggered resulting in no significant retention.  Patient does appear with more retention in pharynx with liquids than solids suspect decreased pharyngeal esophageal segment opening -but this did not result in post swallow aspiration or penetration.  Various compensation strategies attempted to improve initiation timing including counting 1-3 swallow and providing extra bolus to improve sensation.  Patient did not aspirate or penetrate but given report he coughs sometimes with intake and his level of dysphagia, suspect he does experience occasional aspiration.  Recommend mechanical soft diet at this time with strict precautions including observing laryngeal elevation to assure patient swallows.  SLP will follow-up for  dysphagia management to help mitigate aspiration.  Stevan Born was present for the exam Factors that may increase risk of adverse event in presence of aspiration Rubye Oaks & Clearance Coots 2021): Factors that may increase risk of adverse event in presence of aspiration Rubye Oaks & Clearance Coots 2021): Reduced cognitive function; Limited mobility; Frail or deconditioned; Dependence for feeding and/or oral hygiene; Reduced saliva; Weak cough Recommendations/Plan: Swallowing Evaluation Recommendations Swallowing Evaluation Recommendations Recommendations: PO diet PO Diet Recommendation: Dysphagia 3 (Mechanical soft); Thin liquids (Level 0) Liquid Administration via: Cup; Straw; Spoon Medication Administration: Crushed with puree Supervision: Full supervision/cueing for swallowing strategies Swallowing strategies  : Slow rate; Small bites/sips (start intake with liquids) Postural changes: Partially reclined for meals (stay partially upright for 30 minutes after meals) Oral care recommendations: Oral care BID (2x/day); Use suctioning for oral care Caregiver Recommendations: Have oral suction available Treatment Plan Treatment Plan Treatment recommendations: Therapy as outlined  in treatment plan below Follow-up recommendations: Other (comment) (continue slp services as prior to admission) Functional status assessment: Patient has had a recent decline in their functional status and demonstrates the ability to make significant improvements in function in a reasonable and predictable amount of time. Treatment frequency: Min 1x/week Treatment duration: 1 week Interventions: Aspiration precaution training; Compensatory techniques; Patient/family education; Diet toleration management by SLP Recommendations Recommendations for follow up therapy are one component of a multi-disciplinary discharge planning process, led by the attending physician.  Recommendations may be updated based on patient status, additional functional criteria and insurance  authorization. Assessment: Orofacial Exam: Orofacial Exam Oral Cavity: Oral Hygiene: Xerostomia Oral Cavity - Dentition: Adequate natural dentition Orofacial Anatomy: WFL Oral Motor/Sensory Function: Other (comment) (appears with mild left facial asymmetry, able to seal lips on spoon/straw) Anatomy: Anatomy: Suspected cervical osteophytes Boluses Administered: Boluses Administered Boluses Administered: Thin liquids (Level 0); Mildly thick liquids (Level 2, nectar thick); Puree; Solid  Oral Impairment Domain: Oral Impairment Domain Lip Closure: Escape progressing to mid-chin Tongue control during bolus hold: Not tested Bolus preparation/mastication: Slow prolonged chewing/mashing with complete recollection Bolus transport/lingual motion: Repetitive/disorganized tongue motion Oral residue: Residue collection on oral structures Location of oral residue : Tongue Initiation of pharyngeal swallow : Pyriform sinuses  Pharyngeal Impairment Domain: Pharyngeal Impairment Domain Soft palate elevation: No bolus between soft palate (SP)/pharyngeal wall (PW) Laryngeal elevation: Complete superior movement of thyroid cartilage with complete approximation of arytenoids to epiglottic petiole Anterior hyoid excursion: Complete anterior movement Epiglottic movement: Complete inversion Laryngeal vestibule closure: Complete, no air/contrast in laryngeal vestibule Pharyngeal stripping wave : Present - complete Pharyngeal contraction (A/P view only): N/A Pharyngoesophageal segment opening: Partial distention/partial duration, partial obstruction of flow Tongue base retraction: Trace column of contrast or air between tongue base and PPW Pharyngeal residue: Trace residue within or on pharyngeal structures Location of pharyngeal residue: Tongue base; Valleculae; Aryepiglottic folds; Pyriform sinuses  Esophageal Impairment Domain: Esophageal Impairment Domain Esophageal clearance upright position: Complete clearance, esophageal coating Pill:  Pill Consistency administered: Puree Puree: Impaired (see clinical impressions) (slp had to suction pudding and remove tablet as pt did not orally transit it into pharynx) Penetration/Aspiration Scale Score: Penetration/Aspiration Scale Score 1.  Material does not enter airway: Thin liquids (Level 0); Mildly thick liquids (Level 2, nectar thick); Puree; Solid Compensatory Strategies: Compensatory Strategies Compensatory strategies: Yes Reclining posture: Effective Effective Reclining Posture: Thin liquid (Level 0); Mildly thick liquid (Level 2, nectar thick) Other(comment): Ineffective; Effective (counting 1,2,3; providing more barium to help trigger swallow)   General Information: No data recorded Diet Prior to this Study: Regular; Thin liquids (Level 0)   Temperature : Normal   Respiratory Status: WFL   Supplemental O2: None (Room air)   History of Recent Intubation: No  Behavior/Cognition: Distractible; Doesn't follow directions; Alert (pt has dementia) Self-Feeding Abilities: Dependent for feeding Baseline vocal quality/speech: Hypophonia/low volume Volitional Cough: Unable to elicit Volitional Swallow: Unable to elicit Exam Limitations: Poor positioning; Excessive movement; Other (comment) (distractibility) Goal Planning: Prognosis for improved oropharyngeal function: Fair Barriers to Reach Goals: Time post onset; Severity of deficits; Overall medical prognosis No data recorded Patient/Family Stated Goal: wife wants pt to have swallow eval done Consulted and agree with results and recommendations: Patient; Family member/caregiver Pain: Pain Assessment Pain Assessment: No/denies pain End of Session: Start Time:SLP Start Time (ACUTE ONLY): 1006 Stop Time: SLP Stop Time (ACUTE ONLY): 1115 Time Calculation:SLP Time Calculation (min) (ACUTE ONLY): 69 min Charges: SLP Evaluations $ SLP Speech Visit: 1  Visit SLP Evaluations $BSS Swallow: 1 Procedure $Swallowing Treatment: 1 Procedure SLP visit diagnosis: SLP Visit  Diagnosis: Dysphagia, oral phase (R13.11); Dysphagia, pharyngoesophageal phase (R13.14) Past Medical History: Past Medical History: Diagnosis Date  Allergy   Asthma   Chest pain 09/22/2012  Frequent falls   Hearing loss 04/29/2019  utilizes bilateral hearing aids  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  Nocturia 04/15/2018  Orthostatic hypotension 03/02/2021  Parkinson's disease 06/1999  congenital R fourth nerve palsy Past Surgical History: Past Surgical History: Procedure Laterality Date  CATARACT EXTRACTION, BILATERAL    COLONOSCOPY    SHOULDER SURGERY Left   lymph nodes  TONSILLECTOMY    VASECTOMY   Rolena Infante, MS [facility] SLP Acute Rehab Services Office [phone] Chales Abrahams 11/22/2023, 4:14 PM  ECHOCARDIOGRAM COMPLETE Result Date: 11/21/2023    ECHOCARDIOGRAM REPORT   Patient Name:   ABDUL BEIRNE Date of Exam: 11/21/2023 Medical Rec #:  [id number]      Height:       69.0 in Accession #:    [id number]     Weight:       166.0 lb Date of Birth:  January 07, 1951      BSA:          1.909 m Patient Age:    72 years       BP:           159/98 mmHg Patient Gender: M              HR:           67 bpm. Exam Location:  Inpatient Procedure: 2D Echo, Cardiac Doppler and Color Doppler Indications:    Murmur  History:        Patient has prior history of Echocardiogram examinations, most                 recent 05/30/2022. Signs/Symptoms:Syncope.  Sonographer:    Amy Chionchio Referring Phys: [id number] ANASTASSIA DOUTOVA IMPRESSIONS  1. Left ventricular ejection fraction, by estimation, is 60 to 65%. The left ventricle has normal function. The left ventricle has no regional wall motion abnormalities. There is mild concentric left ventricular hypertrophy. Left ventricular diastolic parameters were normal.  2. Right ventricular systolic function is normal. The right ventricular size is normal.  3. The mitral valve is normal in structure. No evidence of mitral valve regurgitation. No evidence of mitral  stenosis.  4. The aortic valve is normal in structure. Aortic valve regurgitation is not visualized. No aortic stenosis is present.  5. The inferior vena cava is normal in size with greater than 50% respiratory variability, suggesting right atrial pressure of 3 mmHg. FINDINGS  Left Ventricle: Left ventricular ejection fraction, by estimation, is 60 to 65%. The left ventricle has normal function. The left ventricle has no regional wall motion abnormalities. The left ventricular internal cavity size was normal in size. There is  mild concentric left ventricular hypertrophy. Left ventricular diastolic parameters were normal. Right Ventricle: The right ventricular size is normal. No increase in right ventricular wall thickness. Right ventricular systolic function is normal. Left Atrium: Left atrial size was normal in size. Right Atrium: Right atrial size was normal in size. Pericardium: There is no evidence of pericardial effusion. Presence of epicardial fat layer. Mitral Valve: The mitral valve is normal in structure. No evidence of mitral valve regurgitation. No evidence of mitral valve stenosis. MV peak gradient, 2.8 mmHg. The mean mitral valve gradient is 1.0 mmHg. Tricuspid Valve: The tricuspid  valve is normal in structure. Tricuspid valve regurgitation is not demonstrated. No evidence of tricuspid stenosis. Aortic Valve: The aortic valve is normal in structure. Aortic valve regurgitation is not visualized. No aortic stenosis is present. Aortic valve mean gradient measures 5.0 mmHg. Aortic valve peak gradient measures 9.6 mmHg. Aortic valve area, by VTI measures 2.87 cm. Pulmonic Valve: The pulmonic valve was normal in structure. Pulmonic valve regurgitation is not visualized. No evidence of pulmonic stenosis. Aorta: The aortic root is normal in size and structure. Venous: The inferior vena cava is normal in size with greater than 50% respiratory variability, suggesting right atrial pressure of 3 mmHg. IAS/Shunts:  No atrial level shunt detected by color flow Doppler.  LEFT VENTRICLE PLAX 2D LVIDd:         4.50 cm      Diastology LVIDs:         3.30 cm      LV e' medial:    8.02 cm/s LV PW:         1.00 cm      LV E/e' medial:  7.2 LV IVS:        1.10 cm      LV e' lateral:   9.51 cm/s LVOT diam:     2.10 cm      LV E/e' lateral: 6.0 LV SV:         80 LV SV Index:   42 LVOT Area:     3.46 cm  LV Volumes (MOD) LV vol d, MOD A2C: 126.0 ml LV vol d, MOD A4C: 91.3 ml LV vol s, MOD A2C: 34.4 ml LV vol s, MOD A4C: 37.0 ml LV SV MOD A2C:     91.6 ml LV SV MOD A4C:     91.3 ml LV SV MOD BP:      74.0 ml RIGHT VENTRICLE             IVC RV Basal diam:  4.00 cm     IVC diam: 1.90 cm RV S prime:     14.20 cm/s TAPSE (M-mode): 2.1 cm LEFT ATRIUM           Index        RIGHT ATRIUM           Index LA Vol (A2C): 58.0 ml 30.39 ml/m  RA Area:     19.90 cm                                    RA Volume:   52.70 ml  27.61 ml/m  AORTIC VALVE                     PULMONIC VALVE AV Area (Vmax):    2.88 cm      PV Vmax:       0.89 m/s AV Area (Vmean):   2.82 cm      PV Peak grad:  3.2 mmHg AV Area (VTI):     2.87 cm AV Vmax:           155.00 cm/s AV Vmean:          107.000 cm/s AV VTI:            0.280 m AV Peak Grad:      9.6 mmHg AV Mean Grad:      5.0 mmHg LVOT Vmax:         129.00  cm/s LVOT Vmean:        87.100 cm/s LVOT VTI:          0.232 m LVOT/AV VTI ratio: 0.83  AORTA Ao Root diam: 3.20 cm Ao Asc diam:  3.40 cm MITRAL VALVE               TRICUSPID VALVE MV Area (PHT): 3.87 cm    TR Peak grad:   21.3 mmHg MV Area VTI:   3.16 cm    TR Vmax:        231.00 cm/s MV Peak grad:  2.8 mmHg MV Mean grad:  1.0 mmHg    SHUNTS MV Vmax:       0.84 m/s    Systemic VTI:  0.23 m MV Vmean:      52.9 cm/s   Systemic Diam: 2.10 cm MV Decel Time: 196 msec MV E velocity: 57.40 cm/s MV A velocity: 60.70 cm/s MV E/A ratio:  0.95 Kardie Tobb DO Electronically signed by Thomasene Ripple DO Signature Date/Time: 11/21/2023/11:39:42 AM    Final    DG Chest 1  View Result Date: 11/19/2023 CLINICAL DATA:  Cough and congestion for 1 week EXAM: CHEST  1 VIEW COMPARISON:  06/05/2022 FINDINGS: Single frontal view of the chest demonstrates stable enlargement of the cardiac silhouette. There is patchy consolidation within the right infrahilar region, consistent with right lower lobe bronchopneumonia given clinical presentation. No effusion or pneumothorax. No acute bony abnormalities. IMPRESSION: 1. Patchy right lower lobe airspace disease consistent with bronchopneumonia given clinical presentation. Electronically Signed   By: Sharlet Salina M.D.   On: 11/19/2023 17:57   CT Head Wo Contrast Result Date: 11/19/2023 CLINICAL DATA:  Mental status change EXAM: CT HEAD WITHOUT CONTRAST TECHNIQUE: Contiguous axial images were obtained from the base of the skull through the vertex without intravenous contrast. RADIATION DOSE REDUCTION: This exam was performed according to the departmental dose-optimization program which includes automated exposure control, adjustment of the mA and/or kV according to patient size and/or use of iterative reconstruction technique. COMPARISON:  CT brain 05/29/2022 FINDINGS: Brain: No acute territorial infarction, hemorrhage or intracranial mass. Mild to moderate atrophy which appears slightly progressed. Slight interval increase in ventricle size likely due to atrophy progression. Moderate periventricular white matter hypodensity consistent with chronic small vessel ischemic change Vascular: No hyperdense vessels.  No unexpected calcification Skull: Normal. Negative for fracture or focal lesion. Sinuses/Orbits: No acute finding. Other: None IMPRESSION: 1. No CT evidence for acute intracranial abnormality. 2. Atrophy and chronic small vessel ischemic changes of the white matter. Electronically Signed   By: Jasmine Pang M.D.   On: 11/19/2023 17:54    Microbiology: Results for orders placed or performed during the hospital encounter of 11/19/23   Culture, blood (routine x 2)     Status: None   Collection Time: 11/19/23  6:04 PM   Specimen: BLOOD  Result Value Ref Range Status   Specimen Description   Final    BLOOD BLOOD LEFT FOREARM Performed at [facility], [address] [place], [place] [address]    Special Requests   Final    BOTTLES DRAWN AEROBIC AND ANAEROBIC Blood Culture results may not be optimal due to an inadequate volume of blood received in culture bottles Performed at [facility], [address], [place], [place] [address]    Culture   Final    NO GROWTH 5 DAYS Performed at [facility], [address], [place], [place]  [address]    Report Status 11/24/2023 FINAL  Final  Resp panel by RT-PCR (RSV, Flu A&B, Covid) Anterior Nasal Swab     Status: Abnormal   Collection Time: 11/19/23  8:51 PM   Specimen: Anterior Nasal Swab  Result Value Ref Range Status   SARS Coronavirus 2 by RT PCR NEGATIVE NEGATIVE Final    Comment: (NOTE) SARS-CoV-2 target nucleic acids are NOT DETECTED.  The SARS-CoV-2 RNA is generally detectable in upper respiratory specimens during the acute phase of infection. The lowest concentration of SARS-CoV-2 viral copies this assay can detect is 138 copies/mL. A negative result does not preclude SARS-Cov-2 infection and should not be used as the sole basis for treatment or other patient management decisions. A negative result may occur with  improper specimen collection/handling, submission of specimen other than nasopharyngeal swab, presence of viral mutation(s) within the areas targeted by this assay, and inadequate number of viral copies(<138 copies/mL). A negative result must be combined with clinical observations, patient history, and epidemiological information. The expected result is Negative.  Fact Sheet for Patients:  BloggerCourse.com  Fact Sheet for Healthcare Providers:   SeriousBroker.it  This test is no t yet approved or cleared by the [place] FDA and  has been authorized for detection and/or diagnosis of SARS-CoV-2 by FDA under an Emergency Use Authorization (EUA). This EUA will remain  in effect (meaning this test can be used) for the duration of the COVID-19 declaration under Section 564(b)(1) of the Act, 21 U.S.C.section 360bbb-3(b)(1), unless the authorization is terminated  or revoked sooner.       Influenza A by PCR POSITIVE (A) NEGATIVE Final   Influenza B by PCR NEGATIVE NEGATIVE Final    Comment: (NOTE) The Xpert Xpress SARS-CoV-2/FLU/RSV plus assay is intended as an aid in the diagnosis of influenza from Nasopharyngeal swab specimens and should not be used as a sole basis for treatment. Nasal washings and aspirates are unacceptable for Xpert Xpress SARS-CoV-2/FLU/RSV testing.  Fact Sheet for Patients: BloggerCourse.com  Fact Sheet for Healthcare Providers: SeriousBroker.it  This test is not yet approved or cleared by the [place] FDA and has been authorized for detection and/or diagnosis of SARS-CoV-2 by FDA under an Emergency Use Authorization (EUA). This EUA will remain in effect (meaning this test can be used) for the duration of the COVID-19 declaration under Section 564(b)(1) of the Act, 21 U.S.C. section 360bbb-3(b)(1), unless the authorization is terminated or revoked.     Resp Syncytial Virus by PCR NEGATIVE NEGATIVE Final    Comment: (NOTE) Fact Sheet for Patients: BloggerCourse.com  Fact Sheet for Healthcare Providers: SeriousBroker.it  This test is not yet approved or cleared by the [place] FDA and has been authorized for detection and/or diagnosis of SARS-CoV-2 by FDA under an Emergency Use Authorization (EUA). This EUA will remain in effect (meaning this test can be used)  for the duration of the COVID-19 declaration under Section 564(b)(1) of the Act, 21 U.S.C. section 360bbb-3(b)(1), unless the authorization is terminated or revoked.  Performed at [facility], [address] [place], [place] [address]   Culture, blood (routine x 2)     Status: None (Preliminary result)   Collection Time: 11/19/23 11:36 PM   Specimen: BLOOD  Result Value Ref Range Status   Specimen Description   Final    BLOOD BLOOD RIGHT ARM Performed at [facility], [address], [place], [place] [address]    Special Requests   Final  BOTTLES DRAWN AEROBIC AND ANAEROBIC Blood Culture results may not be optimal due to an inadequate volume of blood received in culture bottles Performed at [facility], [address], [place], [place] [address]    Culture   Final    NO GROWTH 4 DAYS Performed at [facility], [address] [place], [place] [address]    Report Status PENDING  Incomplete  MRSA Next Gen by PCR, Nasal     Status: None   Collection Time: 11/20/23  3:09 AM   Specimen: Nasal Mucosa; Nasal Swab  Result Value Ref Range Status   MRSA by PCR Next Gen NOT DETECTED NOT DETECTED Final    Comment: (NOTE) The GeneXpert MRSA Assay (FDA approved for NASAL specimens only), is one component of a comprehensive MRSA colonization surveillance program. It is not intended to diagnose MRSA infection nor to guide or monitor treatment for MRSA infections. Test performance is not FDA approved in patients less than 19 years old. Performed at [facility], [address], [place], [place] [address]   Respiratory (~20 pathogens) panel by PCR     Status: Abnormal   Collection Time: 11/22/23  2:19 PM   Specimen: Nasopharyngeal Swab; Respiratory  Result Value Ref Range Status   Adenovirus NOT DETECTED NOT DETECTED Final   Coronavirus 229E NOT DETECTED NOT DETECTED Final    Comment: (NOTE) The  Coronavirus on the Respiratory Panel, DOES NOT test for the novel  Coronavirus (2019 nCoV)    Coronavirus HKU1 NOT DETECTED NOT DETECTED Final   Coronavirus NL63 NOT DETECTED NOT DETECTED Final   Coronavirus OC43 NOT DETECTED NOT DETECTED Final   Metapneumovirus NOT DETECTED NOT DETECTED Final   Rhinovirus / Enterovirus NOT DETECTED NOT DETECTED Final   Influenza A H1 2009 DETECTED (A) NOT DETECTED Final   Influenza B NOT DETECTED NOT DETECTED Final   Parainfluenza Virus 1 NOT DETECTED NOT DETECTED Final   Parainfluenza Virus 2 NOT DETECTED NOT DETECTED Final   Parainfluenza Virus 3 NOT DETECTED NOT DETECTED Final   Parainfluenza Virus 4 NOT DETECTED NOT DETECTED Final   Respiratory Syncytial Virus NOT DETECTED NOT DETECTED Final   Bordetella pertussis NOT DETECTED NOT DETECTED Final   Bordetella Parapertussis NOT DETECTED NOT DETECTED Final   Chlamydophila pneumoniae NOT DETECTED NOT DETECTED Final   Mycoplasma pneumoniae NOT DETECTED NOT DETECTED Final    Comment: Performed at [facility], [address] [place], [place] [address]    Labs: CBC: Recent Labs  Lab 11/19/23 1714 11/20/23 0532 11/21/23 0518 11/22/23 0507  WBC 13.7* 12.2* 8.1 6.9  NEUTROABS 11.8*  --  6.2 5.3  HGB 12.3* 11.2* 10.3* 10.5*  HCT 35.3* 33.8* 31.6* 32.1*  MCV 94.1 96.8 98.8 96.1  PLT 214 190 189 226   Basic Metabolic Panel: Recent Labs  Lab 11/19/23 1714 11/19/23 2336 11/20/23 0532 11/21/23 0518 11/21/23 0931 11/22/23 0507 11/23/23 1432  NA 138   < > 138 137 138 139 138  K 2.6*   < > 3.1* 3.0* 3.1* 3.2* 3.9  CL 103   < > 102 102 102 103 102  CO2 26   < > 29 27 26 27 28   GLUCOSE 114*   < > 102* 92 129* 96 121*  BUN 18   < > 20 14 15 12 9   CREATININE 0.53*   < > 0.47* <0.30* 0.56* 0.52* 0.55*  CALCIUM 7.5*   < > 8.3* 8.1* 8.0* 8.1*  8.3*  MG 1.7  --  2.0  --  2.0  --   --   PHOS 3.0  --  2.5  --   --   --   --    < > = values in this interval not displayed.   Liver Function  Tests: Recent Labs  Lab 11/19/23 1714 11/20/23 0532  AST 13* 11*  ALT <5 6  ALKPHOS 40 43  BILITOT 1.1 1.0  PROT 5.8* 5.8*  ALBUMIN 3.2* 3.1*   CBG: No results for input(s): "GLUCAP" in the last 168 hours.  Discharge time spent: 38 minutes.   Signed: Kathlen Mody, MD [facility] 11/24/2023

## 2023-11-24 NOTE — Plan of Care (Signed)

## 2023-11-24 NOTE — Plan of Care (Signed)
  Problem: Education: Goal: Knowledge of General Education information will improve Description: Including pain rating scale, medication(s)/side effects and non-pharmacologic comfort measures Outcome: Adequate for Discharge   Problem: Health Behavior/Discharge Planning: Goal: Ability to manage health-related needs will improve Outcome: Adequate for Discharge   Problem: Clinical Measurements: Goal: Ability to maintain clinical measurements within normal limits will improve Outcome: Adequate for Discharge Goal: Will remain free from infection Outcome: Adequate for Discharge Goal: Diagnostic test results will improve Outcome: Adequate for Discharge Goal: Respiratory complications will improve Outcome: Adequate for Discharge Goal: Cardiovascular complication will be avoided Outcome: Adequate for Discharge   Problem: Activity: Goal: Risk for activity intolerance will decrease Outcome: Adequate for Discharge   Problem: Nutrition: Goal: Adequate nutrition will be maintained Outcome: Adequate for Discharge   Problem: Coping: Goal: Level of anxiety will decrease Outcome: Adequate for Discharge   Problem: Elimination: Goal: Will not experience complications related to bowel motility Outcome: Adequate for Discharge Goal: Will not experience complications related to urinary retention Outcome: Adequate for Discharge   Problem: Pain Managment: Goal: General experience of comfort will improve and/or be controlled Outcome: Adequate for Discharge   Problem: Safety: Goal: Ability to remain free from injury will improve Outcome: Adequate for Discharge   Problem: Clinical Measurements: Goal: Ability to maintain a body temperature in the normal range will improve Outcome: Adequate for Discharge   Problem: Respiratory: Goal: Ability to maintain adequate ventilation will improve Outcome: Adequate for Discharge Goal: Ability to maintain a clear airway will improve Outcome: Adequate for  Discharge

## 2023-11-24 NOTE — TOC Progression Note (Addendum)
Transition of Care [facility]) - Progression Note    Patient Details  Name: Zachary [name] MRN: [id number] Date of Birth: 07/30/1951  [facility] CM/SW Contact  Maryjean Ka, [place] Phone Number: 11/24/2023, 12:11 PM  Clinical Narrative:    This CSW spoke with [facility] [place] [phone] who reports that the following items are needed for review: FL2, clinical notes, and discharge summary. This CSW update Bonnye Rose,RN.  At 12:24 this CSW sent PENDING documents via fax to [facility] [phone].   Expected Discharge Plan: Assisted Living Barriers to Discharge: Continued Medical Work up  Expected Discharge Plan and Services In-house Referral: Clinical Social Work Discharge Planning Services: NA Post Acute Care Choice: Resumption of Svcs/PTA Provider Living arrangements for the past 2 months: Assisted Living Facility Expected Discharge Date: 11/24/23               DME Arranged: N/A DME Agency: NA                   Social Determinants of Health (SDOH) Interventions SDOH Screenings   Food Insecurity: No Food Insecurity (11/19/2023)  Housing: Low Risk  (11/19/2023)  Transportation Needs: No Transportation Needs (11/19/2023)  Utilities: Not At Risk (11/19/2023)  Social Connections: Patient Unable To Answer (11/19/2023)  Tobacco Use: Low Risk  (11/19/2023)    Readmission Risk Interventions    05/30/2022    1:49 PM  Readmission Risk Prevention Plan  Post Dischage Appt Complete  Medication Screening Complete  Transportation Screening Complete

## 2023-11-25 LAB — CULTURE, BLOOD (ROUTINE X 2): Culture: NO GROWTH

## 2023-11-25 MED ORDER — AMOXICILLIN-POT CLAVULANATE 875-125 MG PO TABS: 1.0000 | ORAL_TABLET | Freq: Two times a day (BID) | ORAL | 0 refills | Status: AC

## 2023-11-25 NOTE — TOC Transition Note (Signed)
Transition of Care [facility]) - Discharge Note   Patient Details  Name: Zachary [name] MRN: [id number] Date of Birth: [birth year]  [facility]) CM/SW Contact:  Otelia Santee, LCSW Phone Number: 11/25/2023, 8:53 AM   Clinical Narrative:    Spoke with Arline Asp at [facility] and confirmed pt able to return to their facility this morning. Call to report [phone]. DC packet with signed DNR placed at RN station. Spoke with pt's spouse who is agreeable to DC plans. PTAR called at 8:43am.    Final next level of care: Assisted Living Barriers to Discharge: Barriers Resolved   Patient Goals and CMS Choice Patient states their goals for this hospitalization and ongoing recovery are:: For pt to return to [facility] CMS Medicare.gov Compare Post Acute Care list provided to:: Patient Represenative (must comment) Choice offered to / list presented to : [facility] POA / Guardian [place] ownership interest in [facility].provided to::  (NA)    Discharge Placement                Patient to be transferred to facility by: PTAR Name of family member notified: Wife Patient and family notified of of transfer: 11/25/23  Discharge Plan and Services Additional resources added to the After Visit Summary for   In-house Referral: Clinical Social Work Discharge Planning Services: NA Post Acute Care Choice: Resumption of Svcs/PTA Provider          DME Arranged: N/A DME Agency: NA       HH Arranged: Speech Therapy       Representative spoke with at [facility] Agency: Tour manager at [employer]  Social Drivers of Health (SDOH) Interventions SDOH Screenings   Food Insecurity: No Food Insecurity (11/19/2023)  Housing: Low Risk  (11/19/2023)  Transportation Needs: No Transportation Needs (11/19/2023)  Utilities: Not At Risk (11/19/2023)  Social Connections: Patient Unable To Answer (11/19/2023)  Tobacco Use: Low Risk  (11/19/2023)     Readmission Risk Interventions    11/25/2023     8:52 AM 05/30/2022    1:49 PM  Readmission Risk Prevention Plan  Post Dischage Appt Complete Complete  Medication Screening Complete Complete  Transportation Screening Complete Complete

## 2023-11-25 NOTE — Plan of Care (Signed)

## 2023-11-29 DIAGNOSIS — Z515 Encounter for palliative care: Secondary | ICD-10-CM | POA: Diagnosis not present

## 2023-11-29 DIAGNOSIS — J09X1 Influenza due to identified novel influenza A virus with pneumonia: Secondary | ICD-10-CM | POA: Diagnosis not present

## 2023-11-29 DIAGNOSIS — R634 Abnormal weight loss: Secondary | ICD-10-CM | POA: Diagnosis not present

## 2023-11-29 DIAGNOSIS — J069 Acute upper respiratory infection, unspecified: Secondary | ICD-10-CM | POA: Diagnosis not present

## 2023-11-29 DIAGNOSIS — N485 Ulcer of penis: Secondary | ICD-10-CM | POA: Diagnosis not present

## 2023-11-29 DIAGNOSIS — D519 Vitamin B12 deficiency anemia, unspecified: Secondary | ICD-10-CM | POA: Diagnosis not present

## 2023-11-29 DIAGNOSIS — R1311 Dysphagia, oral phase: Secondary | ICD-10-CM | POA: Diagnosis not present

## 2023-11-29 DIAGNOSIS — R1310 Dysphagia, unspecified: Secondary | ICD-10-CM | POA: Diagnosis not present

## 2023-11-29 DIAGNOSIS — D509 Iron deficiency anemia, unspecified: Secondary | ICD-10-CM | POA: Diagnosis not present

## 2023-12-02 DIAGNOSIS — D649 Anemia, unspecified: Secondary | ICD-10-CM | POA: Diagnosis not present

## 2023-12-02 DIAGNOSIS — J189 Pneumonia, unspecified organism: Secondary | ICD-10-CM | POA: Diagnosis not present

## 2023-12-02 DIAGNOSIS — G20B1 Parkinson's disease with dyskinesia, without mention of fluctuations: Secondary | ICD-10-CM | POA: Diagnosis not present

## 2023-12-02 DIAGNOSIS — R1310 Dysphagia, unspecified: Secondary | ICD-10-CM | POA: Diagnosis not present

## 2023-12-02 DIAGNOSIS — K5909 Other constipation: Secondary | ICD-10-CM | POA: Diagnosis not present

## 2023-12-02 DIAGNOSIS — E876 Hypokalemia: Secondary | ICD-10-CM | POA: Diagnosis not present

## 2023-12-04 DIAGNOSIS — R41841 Cognitive communication deficit: Secondary | ICD-10-CM | POA: Diagnosis not present

## 2023-12-04 DIAGNOSIS — I69828 Other speech and language deficits following other cerebrovascular disease: Secondary | ICD-10-CM | POA: Diagnosis not present

## 2023-12-07 DIAGNOSIS — G20A1 Parkinson's disease without dyskinesia, without mention of fluctuations: Secondary | ICD-10-CM | POA: Diagnosis not present

## 2023-12-09 DIAGNOSIS — I69828 Other speech and language deficits following other cerebrovascular disease: Secondary | ICD-10-CM | POA: Diagnosis not present

## 2023-12-09 DIAGNOSIS — R41841 Cognitive communication deficit: Secondary | ICD-10-CM | POA: Diagnosis not present

## 2023-12-11 DIAGNOSIS — I69828 Other speech and language deficits following other cerebrovascular disease: Secondary | ICD-10-CM | POA: Diagnosis not present

## 2023-12-11 DIAGNOSIS — R41841 Cognitive communication deficit: Secondary | ICD-10-CM | POA: Diagnosis not present

## 2023-12-12 DIAGNOSIS — B351 Tinea unguium: Secondary | ICD-10-CM | POA: Diagnosis not present

## 2023-12-16 DIAGNOSIS — R41841 Cognitive communication deficit: Secondary | ICD-10-CM | POA: Diagnosis not present

## 2023-12-16 DIAGNOSIS — I69828 Other speech and language deficits following other cerebrovascular disease: Secondary | ICD-10-CM | POA: Diagnosis not present

## 2023-12-23 DIAGNOSIS — R2681 Unsteadiness on feet: Secondary | ICD-10-CM | POA: Diagnosis not present

## 2023-12-23 DIAGNOSIS — M6281 Muscle weakness (generalized): Secondary | ICD-10-CM | POA: Diagnosis not present

## 2023-12-24 DIAGNOSIS — I69828 Other speech and language deficits following other cerebrovascular disease: Secondary | ICD-10-CM | POA: Diagnosis not present

## 2023-12-24 DIAGNOSIS — R41841 Cognitive communication deficit: Secondary | ICD-10-CM | POA: Diagnosis not present

## 2023-12-26 DIAGNOSIS — R41841 Cognitive communication deficit: Secondary | ICD-10-CM | POA: Diagnosis not present

## 2023-12-26 DIAGNOSIS — I69828 Other speech and language deficits following other cerebrovascular disease: Secondary | ICD-10-CM | POA: Diagnosis not present

## 2023-12-29 DIAGNOSIS — M6281 Muscle weakness (generalized): Secondary | ICD-10-CM | POA: Diagnosis not present

## 2023-12-29 DIAGNOSIS — M24562 Contracture, left knee: Secondary | ICD-10-CM | POA: Diagnosis not present

## 2023-12-29 DIAGNOSIS — M24561 Contracture, right knee: Secondary | ICD-10-CM | POA: Diagnosis not present

## 2023-12-30 DIAGNOSIS — R41841 Cognitive communication deficit: Secondary | ICD-10-CM | POA: Diagnosis not present

## 2023-12-30 DIAGNOSIS — I69828 Other speech and language deficits following other cerebrovascular disease: Secondary | ICD-10-CM | POA: Diagnosis not present

## 2023-12-31 DIAGNOSIS — J189 Pneumonia, unspecified organism: Secondary | ICD-10-CM | POA: Diagnosis not present

## 2024-01-04 DIAGNOSIS — G20A1 Parkinson's disease without dyskinesia, without mention of fluctuations: Secondary | ICD-10-CM | POA: Diagnosis not present

## 2024-01-07 ENCOUNTER — Telehealth: Payer: Self-pay | Admitting: Neurology

## 2024-01-07 NOTE — Telephone Encounter (Signed)
 Pt. Wife cncld appt and wanted Dr. Arbutus Leas to know Pt. Admitted to [facility]osp[facility]

## 2024-01-07 NOTE — Telephone Encounter (Signed)
 Called patients wife and let her know we are here for her and Rayjon if they need anything and to take care of herself

## 2024-02-07 ENCOUNTER — Telehealth: Payer: Medicare PPO | Admitting: Neurology

## 2024-05-26 ENCOUNTER — Telehealth: Payer: Self-pay | Admitting: Neurology

## 2024-05-26 NOTE — Telephone Encounter (Signed)
 Pt's wife called in wanting to see if Dr. Evonnie can write an order for a gait belt. The pt is in a wheelchair and it is sometimes called restraining belts. They are afraid he will fall out of the wheelchair if they don't use the gait belt.

## 2024-05-27 NOTE — Telephone Encounter (Signed)
 Spoke to [facility] patients wife and they are looking for therapeutic restraints. There is a specific form for such restraints. It is only on his upper body because he can no longer sit up and has no upper body strength. He is able to go down to common areas and participate in activities and spend time socially when is in the upright position. They will not be able to put him in his wheelchair any longer if not. Patients wife said she would bring him up here or do a virtual appt. They are in touch with Hospice but no one will call them back with this issue. Patietns wife stressed she only wants this to help not restrain

## 2024-05-27 NOTE — Telephone Encounter (Signed)
 Pt wife called wanting to speak with chelsea. She states chelsea would know what she needs

## 2024-05-27 NOTE — Telephone Encounter (Signed)
 Zachary [name], is he in hospice?  If so, call them.  It is their job to coordinate this type of care so the patients don't have to travel when they can't.  This is why patients are in hospice.  Hospice receives money to care for the patients and they need to help him

## 2024-05-29 NOTE — Telephone Encounter (Signed)
 Called patients wife and received information for [facility] . Patients nurse case manager is Olam Sharps. Dr is Odella Pepper [phone]. I called and was unable to reach her and left detailed message.

## 2024-05-29 NOTE — Telephone Encounter (Signed)
 Olam called back and per facility gait belt restraints are not allowed. She is putting an order for OT for this patient

## 2024-08-31 ENCOUNTER — Telehealth: Payer: Self-pay

## 2024-08-31 NOTE — Telephone Encounter (Signed)
 Patients wife called. She has moved patient out of [facility] due to unhealthy environment. Patients daughter has served the wife with paperwork she wants to take over patients care and move him to skilled nursing instead of the 24 /7 care he is receiving at home with his wife. Patients wife wanting a letter from dr. Evonnie on why the patients children were banned from the office.I gave patietns wife Zachary [name] the number to Bonna Lent to get information

## 2024-08-31 NOTE — Telephone Encounter (Signed)
 Unfortunately, we cannot get involved in a family dispute.  This is just not within my realm.

## 2024-09-04 ENCOUNTER — Other Ambulatory Visit (HOSPITAL_BASED_OUTPATIENT_CLINIC_OR_DEPARTMENT_OTHER): Payer: Self-pay

## 2024-09-04 MED ORDER — TAMSULOSIN HCL 0.4 MG PO CAPS
0.4000 mg | ORAL_CAPSULE | Freq: Every day | ORAL | 3 refills | Status: AC
  Filled 2024-09-04: qty 30, 30d supply, fill #0
  Filled 2024-10-01: qty 30, 30d supply, fill #1

## 2024-09-07 ENCOUNTER — Other Ambulatory Visit (HOSPITAL_BASED_OUTPATIENT_CLINIC_OR_DEPARTMENT_OTHER): Payer: Self-pay

## 2024-09-07 ENCOUNTER — Other Ambulatory Visit: Payer: Self-pay

## 2024-09-07 MED ORDER — BISACODYL 10 MG RE SUPP
10.0000 mg | RECTAL | 0 refills | Status: AC
  Filled 2024-09-07: qty 5, 15d supply, fill #0

## 2024-09-08 ENCOUNTER — Other Ambulatory Visit (HOSPITAL_BASED_OUTPATIENT_CLINIC_OR_DEPARTMENT_OTHER): Payer: Self-pay

## 2024-09-08 ENCOUNTER — Other Ambulatory Visit: Payer: Self-pay

## 2024-09-11 ENCOUNTER — Other Ambulatory Visit (HOSPITAL_COMMUNITY): Payer: Self-pay

## 2024-09-11 ENCOUNTER — Other Ambulatory Visit (HOSPITAL_BASED_OUTPATIENT_CLINIC_OR_DEPARTMENT_OTHER): Payer: Self-pay

## 2024-09-11 MED ORDER — CARBIDOPA-LEVODOPA 25-100 MG PO TABS
3.0000 | ORAL_TABLET | Freq: Every morning | ORAL | 3 refills | Status: AC
  Filled 2024-09-11 (×3): qty 90, 30d supply, fill #0

## 2024-09-11 MED ORDER — CARBIDOPA-LEVODOPA 25-100 MG PO TABS
2.0000 | ORAL_TABLET | Freq: Every day | ORAL | 3 refills | Status: AC
  Filled 2024-09-11 (×2): qty 60, 30d supply, fill #0

## 2024-09-11 MED ORDER — CARBIDOPA-LEVODOPA 25-100 MG PO TABS
2.0000 | ORAL_TABLET | ORAL | 3 refills | Status: AC
  Filled 2024-09-11 (×3): qty 360, 30d supply, fill #0

## 2024-09-12 ENCOUNTER — Other Ambulatory Visit (HOSPITAL_BASED_OUTPATIENT_CLINIC_OR_DEPARTMENT_OTHER): Payer: Self-pay

## 2024-09-14 ENCOUNTER — Other Ambulatory Visit (HOSPITAL_BASED_OUTPATIENT_CLINIC_OR_DEPARTMENT_OTHER): Payer: Self-pay

## 2024-09-25 ENCOUNTER — Other Ambulatory Visit: Payer: Self-pay

## 2024-09-25 ENCOUNTER — Other Ambulatory Visit (HOSPITAL_BASED_OUTPATIENT_CLINIC_OR_DEPARTMENT_OTHER): Payer: Self-pay

## 2024-09-25 MED ORDER — MIDODRINE HCL 5 MG PO TABS
10.0000 mg | ORAL_TABLET | Freq: Every morning | ORAL | 0 refills | Status: AC
  Filled 2024-09-25 – 2024-10-01 (×6): qty 60, 30d supply, fill #0

## 2024-09-25 MED ORDER — CLONAZEPAM 0.5 MG PO TABS
0.5000 mg | ORAL_TABLET | Freq: Two times a day (BID) | ORAL | 0 refills | Status: AC | PRN
Start: 2024-09-25 — End: ?
  Filled 2024-09-25: qty 20, 10d supply, fill #0

## 2024-09-25 MED ORDER — POTASSIUM CHLORIDE 20 MEQ PO PACK
20.0000 meq | PACK | Freq: Every day | ORAL | 3 refills | Status: AC
  Filled 2024-09-25: qty 30, 30d supply, fill #0

## 2024-09-25 MED ORDER — FLUDROCORTISONE ACETATE 0.1 MG PO TABS
0.2000 mg | ORAL_TABLET | Freq: Two times a day (BID) | ORAL | 3 refills | Status: AC
  Filled 2024-09-25: qty 120, 30d supply, fill #0

## 2024-09-25 MED ORDER — MIDODRINE HCL 5 MG PO TABS
5.0000 mg | ORAL_TABLET | Freq: Every day | ORAL | 0 refills | Status: AC
  Filled 2024-09-25 – 2024-10-01 (×3): qty 30, 30d supply, fill #0

## 2024-09-26 ENCOUNTER — Other Ambulatory Visit (HOSPITAL_BASED_OUTPATIENT_CLINIC_OR_DEPARTMENT_OTHER): Payer: Self-pay

## 2024-09-28 ENCOUNTER — Other Ambulatory Visit: Payer: Self-pay

## 2024-09-29 ENCOUNTER — Other Ambulatory Visit (HOSPITAL_BASED_OUTPATIENT_CLINIC_OR_DEPARTMENT_OTHER): Payer: Self-pay

## 2024-10-01 ENCOUNTER — Other Ambulatory Visit (HOSPITAL_BASED_OUTPATIENT_CLINIC_OR_DEPARTMENT_OTHER): Payer: Self-pay

## 2024-10-01 ENCOUNTER — Other Ambulatory Visit: Payer: Self-pay

## 2024-10-02 ENCOUNTER — Other Ambulatory Visit (HOSPITAL_BASED_OUTPATIENT_CLINIC_OR_DEPARTMENT_OTHER): Payer: Self-pay

## 2024-10-02 MED ORDER — SENNOSIDES 8.6 MG PO TABS
2.0000 | ORAL_TABLET | Freq: Every day | ORAL | 0 refills | Status: DC
  Filled 2024-10-02: qty 60, 30d supply, fill #0

## 2024-10-10 ENCOUNTER — Other Ambulatory Visit (HOSPITAL_BASED_OUTPATIENT_CLINIC_OR_DEPARTMENT_OTHER): Payer: Self-pay

## 2024-10-12 ENCOUNTER — Other Ambulatory Visit (HOSPITAL_BASED_OUTPATIENT_CLINIC_OR_DEPARTMENT_OTHER): Payer: Self-pay

## 2024-10-12 MED ORDER — FLUDROCORTISONE ACETATE 0.1 MG PO TABS
0.2000 mg | ORAL_TABLET | Freq: Two times a day (BID) | ORAL | 3 refills | Status: AC
  Filled 2024-10-12 – 2024-10-19 (×5): qty 120, 30d supply, fill #0

## 2024-10-12 MED ORDER — CLONAZEPAM 0.5 MG PO TABS
0.5000 mg | ORAL_TABLET | Freq: Two times a day (BID) | ORAL | 0 refills | Status: AC
  Filled 2024-10-12: qty 20, 10d supply, fill #0

## 2024-10-16 ENCOUNTER — Other Ambulatory Visit (HOSPITAL_BASED_OUTPATIENT_CLINIC_OR_DEPARTMENT_OTHER): Payer: Self-pay

## 2024-10-16 MED ORDER — MIDODRINE HCL 5 MG PO TABS
10.0000 mg | ORAL_TABLET | Freq: Every morning | ORAL | 0 refills | Status: AC
  Filled 2024-10-16 – 2024-10-19 (×3): qty 60, 30d supply, fill #0

## 2024-10-16 MED ORDER — MIDODRINE HCL 5 MG PO TABS
5.0000 mg | ORAL_TABLET | Freq: Every day | ORAL | 0 refills | Status: AC
  Filled 2024-10-16 – 2024-10-19 (×3): qty 30, 30d supply, fill #0

## 2024-10-19 ENCOUNTER — Other Ambulatory Visit (HOSPITAL_COMMUNITY): Payer: Self-pay

## 2024-10-19 ENCOUNTER — Other Ambulatory Visit: Payer: Self-pay

## 2024-10-19 ENCOUNTER — Other Ambulatory Visit (HOSPITAL_BASED_OUTPATIENT_CLINIC_OR_DEPARTMENT_OTHER): Payer: Self-pay

## 2024-10-19 MED ORDER — CARBIDOPA-LEVODOPA 25-100 MG PO TABS
2.0000 | ORAL_TABLET | ORAL | 3 refills | Status: AC
  Filled 2024-10-19: qty 360, 30d supply, fill #0

## 2024-10-19 MED ORDER — CARBIDOPA-LEVODOPA 25-100 MG PO TABS
2.0000 | ORAL_TABLET | Freq: Every day | ORAL | 3 refills | Status: AC
  Filled 2024-10-19: qty 60, 30d supply, fill #0

## 2024-10-19 MED ORDER — BISACODYL 10 MG RE SUPP
10.0000 mg | RECTAL | 0 refills | Status: DC | PRN
  Filled 2024-10-19: qty 5, 15d supply, fill #0
  Filled 2024-10-20: qty 4, 12d supply, fill #0

## 2024-10-19 MED ORDER — CARBIDOPA-LEVODOPA 25-100 MG PO TABS
3.0000 | ORAL_TABLET | Freq: Every morning | ORAL | 3 refills | Status: AC
  Filled 2024-10-19: qty 90, 30d supply, fill #0

## 2024-10-20 ENCOUNTER — Other Ambulatory Visit (HOSPITAL_BASED_OUTPATIENT_CLINIC_OR_DEPARTMENT_OTHER): Payer: Self-pay

## 2024-10-21 ENCOUNTER — Other Ambulatory Visit (HOSPITAL_BASED_OUTPATIENT_CLINIC_OR_DEPARTMENT_OTHER): Payer: Self-pay

## 2024-10-23 ENCOUNTER — Other Ambulatory Visit: Payer: Self-pay

## 2024-10-23 ENCOUNTER — Other Ambulatory Visit (HOSPITAL_BASED_OUTPATIENT_CLINIC_OR_DEPARTMENT_OTHER): Payer: Self-pay

## 2024-10-23 MED ORDER — GUAIFENESIN 100 MG/5ML PO LIQD
200.0000 mg | ORAL | 0 refills | Status: AC | PRN
  Filled 2024-10-23: qty 473, 8d supply, fill #0

## 2024-10-24 ENCOUNTER — Other Ambulatory Visit (HOSPITAL_BASED_OUTPATIENT_CLINIC_OR_DEPARTMENT_OTHER): Payer: Self-pay

## 2024-10-27 ENCOUNTER — Other Ambulatory Visit (HOSPITAL_BASED_OUTPATIENT_CLINIC_OR_DEPARTMENT_OTHER): Payer: Self-pay

## 2024-10-27 MED ORDER — CLONAZEPAM 0.5 MG PO TABS
0.5000 mg | ORAL_TABLET | Freq: Two times a day (BID) | ORAL | 0 refills | Status: AC | PRN
  Filled 2024-10-27: qty 20, 10d supply, fill #0

## 2024-11-02 ENCOUNTER — Other Ambulatory Visit (HOSPITAL_BASED_OUTPATIENT_CLINIC_OR_DEPARTMENT_OTHER): Payer: Self-pay

## 2024-11-02 ENCOUNTER — Other Ambulatory Visit: Payer: Self-pay

## 2024-11-02 MED ORDER — TAMSULOSIN HCL 0.4 MG PO CAPS
0.4000 mg | ORAL_CAPSULE | Freq: Every day | ORAL | 3 refills | Status: AC
  Filled 2024-11-02: qty 30, 30d supply, fill #0

## 2024-11-02 MED ORDER — SORBITOL 70 % SOLN
0 refills | Status: AC
  Filled 2024-11-02: qty 473, 2d supply, fill #0

## 2024-11-02 MED ORDER — MIDODRINE HCL 5 MG PO TABS
10.0000 mg | ORAL_TABLET | Freq: Every morning | ORAL | 0 refills | Status: AC
  Filled 2024-11-02: qty 60, 30d supply, fill #0

## 2024-11-02 MED ORDER — CLONAZEPAM 0.5 MG PO TABS
0.5000 mg | ORAL_TABLET | Freq: Two times a day (BID) | ORAL | 0 refills | Status: AC | PRN
  Filled 2024-11-02: qty 28, 14d supply, fill #0

## 2024-11-03 ENCOUNTER — Other Ambulatory Visit (HOSPITAL_BASED_OUTPATIENT_CLINIC_OR_DEPARTMENT_OTHER): Payer: Self-pay

## 2024-11-04 ENCOUNTER — Other Ambulatory Visit (HOSPITAL_BASED_OUTPATIENT_CLINIC_OR_DEPARTMENT_OTHER): Payer: Self-pay

## 2024-11-04 MED ORDER — POTASSIUM CHLORIDE 20 MEQ PO PACK
20.0000 meq | PACK | Freq: Every day | ORAL | 3 refills | Status: AC
  Filled 2024-11-04: qty 30, 30d supply, fill #0

## 2024-11-16 ENCOUNTER — Other Ambulatory Visit (HOSPITAL_BASED_OUTPATIENT_CLINIC_OR_DEPARTMENT_OTHER): Payer: Self-pay

## 2024-11-16 MED ORDER — BISACODYL 10 MG RE SUPP
10.0000 mg | RECTAL | 0 refills | Status: AC | PRN
  Filled 2024-11-16: qty 4, 12d supply, fill #0

## 2024-11-16 MED ORDER — SENNOSIDES 8.6 MG PO TABS
2.0000 | ORAL_TABLET | Freq: Two times a day (BID) | ORAL | 3 refills | Status: AC
  Filled 2024-11-16: qty 120, 30d supply, fill #0

## 2024-11-16 MED ORDER — CLONAZEPAM 0.5 MG PO TABS
0.5000 mg | ORAL_TABLET | Freq: Two times a day (BID) | ORAL | 0 refills | Status: AC | PRN
  Filled 2024-11-16: qty 28, 14d supply, fill #0

## 2024-11-23 ENCOUNTER — Other Ambulatory Visit (HOSPITAL_BASED_OUTPATIENT_CLINIC_OR_DEPARTMENT_OTHER): Payer: Self-pay

## 2024-11-23 MED ORDER — FLUDROCORTISONE ACETATE 0.1 MG PO TABS
0.2000 mg | ORAL_TABLET | Freq: Two times a day (BID) | ORAL | 3 refills | Status: AC
  Filled 2024-11-23: qty 120, 30d supply, fill #0

## 2024-11-27 ENCOUNTER — Other Ambulatory Visit (HOSPITAL_BASED_OUTPATIENT_CLINIC_OR_DEPARTMENT_OTHER): Payer: Self-pay

## 2024-11-27 MED ORDER — CLONAZEPAM 0.5 MG PO TABS
0.5000 mg | ORAL_TABLET | Freq: Two times a day (BID) | ORAL | 0 refills | Status: AC | PRN
  Filled 2024-11-27: qty 28, 14d supply, fill #0

## 2024-11-30 ENCOUNTER — Other Ambulatory Visit (HOSPITAL_BASED_OUTPATIENT_CLINIC_OR_DEPARTMENT_OTHER): Payer: Self-pay

## 2024-11-30 MED ORDER — CALMOSEPTINE 0.44-20.6 % EX OINT
TOPICAL_OINTMENT | CUTANEOUS | 0 refills | Status: AC
  Filled 2024-11-30: qty 113, 14d supply, fill #0

## 2024-11-30 MED ORDER — CARBIDOPA-LEVODOPA 25-100 MG PO TABS
ORAL_TABLET | ORAL | 3 refills | Status: AC
  Filled 2024-11-30: qty 360, 30d supply, fill #0

## 2024-12-02 ENCOUNTER — Other Ambulatory Visit (HOSPITAL_BASED_OUTPATIENT_CLINIC_OR_DEPARTMENT_OTHER): Payer: Self-pay

## 2024-12-02 MED ORDER — HALOPERIDOL 5 MG PO TABS
5.0000 mg | ORAL_TABLET | ORAL | 0 refills | Status: AC | PRN
  Filled 2024-12-02: qty 60, 10d supply, fill #0

## 2024-12-02 MED ORDER — MORPHINE SULFATE (CONCENTRATE) 10 MG /0.5 ML PO SOLN
5.0000 mg | ORAL | 0 refills | Status: AC | PRN
  Filled 2024-12-02: qty 30, 20d supply, fill #0

## 2024-12-02 MED ORDER — CLONAZEPAM 0.5 MG PO TABS
0.5000 mg | ORAL_TABLET | Freq: Two times a day (BID) | ORAL | 0 refills | Status: AC | PRN
  Filled 2024-12-02: qty 30, 15d supply, fill #0

## 2024-12-04 ENCOUNTER — Other Ambulatory Visit (HOSPITAL_BASED_OUTPATIENT_CLINIC_OR_DEPARTMENT_OTHER): Payer: Self-pay

## 2024-12-04 MED ORDER — ACETAMINOPHEN 650 MG RE SUPP
650.0000 mg | RECTAL | 0 refills | Status: AC | PRN
  Filled 2024-12-04: qty 48, 8d supply, fill #0
